# Patient Record
Sex: Female | Born: 1975 | Race: Black or African American | Hispanic: No | Marital: Single | Smoking: Never smoker
Health system: Southern US, Community
[De-identification: ages and names within clinical notes are randomized; demographics above are authoritative.]

## PROBLEM LIST (undated history)

## (undated) DIAGNOSIS — Z973 Presence of spectacles and contact lenses: Secondary | ICD-10-CM

## (undated) DIAGNOSIS — E785 Hyperlipidemia, unspecified: Secondary | ICD-10-CM

## (undated) DIAGNOSIS — I82601 Acute embolism and thrombosis of unspecified veins of right upper extremity: Secondary | ICD-10-CM

## (undated) DIAGNOSIS — E559 Vitamin D deficiency, unspecified: Secondary | ICD-10-CM

## (undated) DIAGNOSIS — F419 Anxiety disorder, unspecified: Secondary | ICD-10-CM

## (undated) HISTORY — PX: WRIST SURGERY: SHX841

## (undated) HISTORY — DX: Anxiety disorder, unspecified: F41.9

## (undated) HISTORY — DX: Acute embolism and thrombosis of unspecified veins of right upper extremity: I82.601

## (undated) HISTORY — DX: Hyperlipidemia, unspecified: E78.5

## (undated) HISTORY — DX: Vitamin D deficiency, unspecified: E55.9

---

## 1999-05-02 ENCOUNTER — Other Ambulatory Visit: Admission: RE | Admit: 1999-05-02 | Discharge: 1999-05-02 | Payer: Self-pay | Admitting: *Deleted

## 1999-08-12 ENCOUNTER — Encounter (INDEPENDENT_AMBULATORY_CARE_PROVIDER_SITE_OTHER): Payer: Self-pay

## 1999-08-12 ENCOUNTER — Other Ambulatory Visit: Admission: RE | Admit: 1999-08-12 | Discharge: 1999-08-12 | Payer: Self-pay | Admitting: *Deleted

## 1999-09-12 ENCOUNTER — Encounter (INDEPENDENT_AMBULATORY_CARE_PROVIDER_SITE_OTHER): Payer: Self-pay | Admitting: Specialist

## 1999-09-12 ENCOUNTER — Ambulatory Visit (HOSPITAL_COMMUNITY): Admission: RE | Admit: 1999-09-12 | Discharge: 1999-09-12 | Payer: Self-pay | Admitting: *Deleted

## 2000-04-02 ENCOUNTER — Emergency Department (HOSPITAL_COMMUNITY): Admission: EM | Admit: 2000-04-02 | Discharge: 2000-04-02 | Payer: Self-pay

## 2000-04-17 ENCOUNTER — Other Ambulatory Visit: Admission: RE | Admit: 2000-04-17 | Discharge: 2000-04-17 | Payer: Self-pay | Admitting: Obstetrics and Gynecology

## 2001-12-14 ENCOUNTER — Emergency Department (HOSPITAL_COMMUNITY): Admission: EM | Admit: 2001-12-14 | Discharge: 2001-12-14 | Payer: Self-pay | Admitting: Emergency Medicine

## 2001-12-14 ENCOUNTER — Encounter: Payer: Self-pay | Admitting: Emergency Medicine

## 2002-04-29 ENCOUNTER — Emergency Department (HOSPITAL_COMMUNITY): Admission: EM | Admit: 2002-04-29 | Discharge: 2002-04-29 | Payer: Self-pay | Admitting: Emergency Medicine

## 2002-07-17 ENCOUNTER — Other Ambulatory Visit: Admission: RE | Admit: 2002-07-17 | Discharge: 2002-07-17 | Payer: Self-pay | Admitting: Obstetrics and Gynecology

## 2003-07-21 ENCOUNTER — Other Ambulatory Visit: Admission: RE | Admit: 2003-07-21 | Discharge: 2003-07-21 | Payer: Self-pay | Admitting: Obstetrics and Gynecology

## 2004-05-04 ENCOUNTER — Emergency Department (HOSPITAL_COMMUNITY): Admission: EM | Admit: 2004-05-04 | Discharge: 2004-05-04 | Payer: Self-pay

## 2004-09-01 ENCOUNTER — Other Ambulatory Visit: Admission: RE | Admit: 2004-09-01 | Discharge: 2004-09-01 | Payer: Self-pay | Admitting: Obstetrics and Gynecology

## 2004-10-22 ENCOUNTER — Emergency Department (HOSPITAL_COMMUNITY): Admission: EM | Admit: 2004-10-22 | Discharge: 2004-10-22 | Payer: Self-pay | Admitting: Emergency Medicine

## 2004-10-28 ENCOUNTER — Ambulatory Visit: Payer: Self-pay | Admitting: Internal Medicine

## 2005-11-16 ENCOUNTER — Other Ambulatory Visit: Admission: RE | Admit: 2005-11-16 | Discharge: 2005-11-16 | Payer: Self-pay | Admitting: Obstetrics and Gynecology

## 2006-07-20 ENCOUNTER — Ambulatory Visit: Payer: Self-pay | Admitting: Family Medicine

## 2006-11-29 DIAGNOSIS — G43909 Migraine, unspecified, not intractable, without status migrainosus: Secondary | ICD-10-CM | POA: Insufficient documentation

## 2008-07-23 ENCOUNTER — Emergency Department (HOSPITAL_COMMUNITY): Admission: EM | Admit: 2008-07-23 | Discharge: 2008-07-23 | Payer: Self-pay | Admitting: Emergency Medicine

## 2008-07-31 ENCOUNTER — Ambulatory Visit: Payer: Self-pay | Admitting: Internal Medicine

## 2008-07-31 DIAGNOSIS — J189 Pneumonia, unspecified organism: Secondary | ICD-10-CM | POA: Insufficient documentation

## 2008-11-30 ENCOUNTER — Ambulatory Visit: Payer: Self-pay | Admitting: Internal Medicine

## 2008-11-30 DIAGNOSIS — M79609 Pain in unspecified limb: Secondary | ICD-10-CM | POA: Insufficient documentation

## 2008-11-30 DIAGNOSIS — L659 Nonscarring hair loss, unspecified: Secondary | ICD-10-CM | POA: Insufficient documentation

## 2008-11-30 LAB — CONVERTED CEMR LAB
AST: 17 units/L (ref 0–37)
Albumin: 3.8 g/dL (ref 3.5–5.2)
BUN: 9 mg/dL (ref 6–23)
Basophils Absolute: 0 10*3/uL (ref 0.0–0.1)
Basophils Relative: 0.7 % (ref 0.0–3.0)
Chloride: 106 meq/L (ref 96–112)
Creatinine, Ser: 0.8 mg/dL (ref 0.4–1.2)
Direct LDL: 147.6 mg/dL
Eosinophils Absolute: 0.1 10*3/uL (ref 0.0–0.7)
Eosinophils Relative: 1.3 % (ref 0.0–5.0)
GFR calc Af Amer: 107 mL/min
GFR calc non Af Amer: 88 mL/min
HCT: 41.1 % (ref 36.0–46.0)
HDL: 94.3 mg/dL (ref >39.0)
MCHC: 34.7 g/dL (ref 30.0–36.0)
MCV: 90.5 fL (ref 78.0–100.0)
Monocytes Absolute: 0.6 10*3/uL (ref 0.1–1.0)
Neutrophils Relative %: 57.7 % (ref 43.0–77.0)
Platelets: 241 10*3/uL (ref 150–400)
RBC: 4.54 M/uL (ref 3.87–5.11)
TSH: 0.51 microintl units/mL (ref 0.35–5.50)
Total Bilirubin: 0.6 mg/dL (ref 0.3–1.2)
Triglycerides: 42 mg/dL (ref 0–149)

## 2008-12-03 ENCOUNTER — Encounter: Payer: Self-pay | Admitting: Internal Medicine

## 2009-04-05 ENCOUNTER — Emergency Department (HOSPITAL_COMMUNITY): Admission: EM | Admit: 2009-04-05 | Discharge: 2009-04-05 | Payer: Self-pay | Admitting: Emergency Medicine

## 2009-04-06 ENCOUNTER — Encounter: Payer: Self-pay | Admitting: Internal Medicine

## 2009-04-07 ENCOUNTER — Ambulatory Visit: Payer: Self-pay | Admitting: Internal Medicine

## 2009-04-07 DIAGNOSIS — G43911 Migraine, unspecified, intractable, with status migrainosus: Secondary | ICD-10-CM | POA: Insufficient documentation

## 2009-04-10 ENCOUNTER — Encounter: Admission: RE | Admit: 2009-04-10 | Discharge: 2009-04-10 | Payer: Self-pay | Admitting: Specialist

## 2009-04-10 ENCOUNTER — Encounter: Payer: Self-pay | Admitting: Internal Medicine

## 2009-04-13 ENCOUNTER — Encounter: Payer: Self-pay | Admitting: Internal Medicine

## 2009-04-15 ENCOUNTER — Telehealth (INDEPENDENT_AMBULATORY_CARE_PROVIDER_SITE_OTHER): Payer: Self-pay | Admitting: *Deleted

## 2009-04-22 ENCOUNTER — Encounter: Payer: Self-pay | Admitting: Internal Medicine

## 2009-04-26 ENCOUNTER — Ambulatory Visit: Payer: Self-pay | Admitting: Internal Medicine

## 2009-04-26 DIAGNOSIS — J069 Acute upper respiratory infection, unspecified: Secondary | ICD-10-CM | POA: Insufficient documentation

## 2009-05-19 ENCOUNTER — Ambulatory Visit: Payer: Self-pay | Admitting: Internal Medicine

## 2009-05-19 DIAGNOSIS — G4700 Insomnia, unspecified: Secondary | ICD-10-CM | POA: Insufficient documentation

## 2009-05-23 DIAGNOSIS — F411 Generalized anxiety disorder: Secondary | ICD-10-CM | POA: Insufficient documentation

## 2009-05-23 DIAGNOSIS — F329 Major depressive disorder, single episode, unspecified: Secondary | ICD-10-CM | POA: Insufficient documentation

## 2009-05-26 ENCOUNTER — Telehealth (INDEPENDENT_AMBULATORY_CARE_PROVIDER_SITE_OTHER): Payer: Self-pay | Admitting: *Deleted

## 2009-05-28 ENCOUNTER — Encounter: Payer: Self-pay | Admitting: Internal Medicine

## 2009-06-15 ENCOUNTER — Encounter: Payer: Self-pay | Admitting: Internal Medicine

## 2009-06-16 ENCOUNTER — Ambulatory Visit: Payer: Self-pay | Admitting: Internal Medicine

## 2009-06-16 DIAGNOSIS — I10 Essential (primary) hypertension: Secondary | ICD-10-CM | POA: Insufficient documentation

## 2009-07-20 ENCOUNTER — Encounter: Payer: Self-pay | Admitting: Internal Medicine

## 2009-08-24 ENCOUNTER — Encounter: Payer: Self-pay | Admitting: Internal Medicine

## 2009-10-02 HISTORY — PX: OTHER SURGICAL HISTORY: SHX169

## 2009-11-22 ENCOUNTER — Emergency Department (HOSPITAL_COMMUNITY): Admission: EM | Admit: 2009-11-22 | Discharge: 2009-11-22 | Payer: Self-pay | Admitting: Emergency Medicine

## 2009-11-24 ENCOUNTER — Telehealth: Payer: Self-pay | Admitting: Internal Medicine

## 2009-12-03 ENCOUNTER — Encounter: Payer: Self-pay | Admitting: Internal Medicine

## 2010-01-10 ENCOUNTER — Encounter: Payer: Self-pay | Admitting: Internal Medicine

## 2010-02-07 ENCOUNTER — Emergency Department (HOSPITAL_COMMUNITY): Admission: EM | Admit: 2010-02-07 | Discharge: 2010-02-07 | Payer: Self-pay | Admitting: Emergency Medicine

## 2010-03-01 ENCOUNTER — Encounter: Payer: Self-pay | Admitting: Internal Medicine

## 2010-03-18 LAB — CONVERTED CEMR LAB: Pap Smear: NORMAL

## 2010-03-24 ENCOUNTER — Emergency Department (HOSPITAL_COMMUNITY): Admission: EM | Admit: 2010-03-24 | Discharge: 2010-03-24 | Payer: Self-pay | Admitting: Emergency Medicine

## 2010-03-29 ENCOUNTER — Emergency Department (HOSPITAL_COMMUNITY): Admission: EM | Admit: 2010-03-29 | Discharge: 2010-03-29 | Payer: Self-pay | Admitting: Emergency Medicine

## 2010-04-10 ENCOUNTER — Emergency Department (HOSPITAL_COMMUNITY): Admission: EM | Admit: 2010-04-10 | Discharge: 2010-04-10 | Payer: Self-pay | Admitting: Emergency Medicine

## 2010-04-29 ENCOUNTER — Encounter: Payer: Self-pay | Admitting: Internal Medicine

## 2010-05-18 ENCOUNTER — Emergency Department (HOSPITAL_COMMUNITY): Admission: EM | Admit: 2010-05-18 | Discharge: 2010-05-18 | Payer: Self-pay | Admitting: Emergency Medicine

## 2010-05-19 ENCOUNTER — Ambulatory Visit: Payer: Self-pay | Admitting: Internal Medicine

## 2010-05-19 DIAGNOSIS — J019 Acute sinusitis, unspecified: Secondary | ICD-10-CM | POA: Insufficient documentation

## 2010-05-25 ENCOUNTER — Encounter: Payer: Self-pay | Admitting: Internal Medicine

## 2010-06-20 ENCOUNTER — Encounter: Payer: Self-pay | Admitting: Internal Medicine

## 2010-06-21 ENCOUNTER — Ambulatory Visit (HOSPITAL_BASED_OUTPATIENT_CLINIC_OR_DEPARTMENT_OTHER): Admission: RE | Admit: 2010-06-21 | Discharge: 2010-06-21 | Payer: Self-pay | Admitting: Neurology

## 2010-06-25 ENCOUNTER — Ambulatory Visit: Payer: Self-pay | Admitting: Internal Medicine

## 2010-07-14 ENCOUNTER — Ambulatory Visit: Payer: Self-pay | Admitting: Internal Medicine

## 2010-07-21 ENCOUNTER — Ambulatory Visit: Payer: Self-pay | Admitting: Internal Medicine

## 2010-07-21 LAB — CONVERTED CEMR LAB
ALT: 11 units/L (ref 0–35)
AST: 15 units/L (ref 0–37)
Albumin: 4.3 g/dL (ref 3.5–5.2)
Alkaline Phosphatase: 74 units/L (ref 39–117)
Basophils Absolute: 0 10*3/uL (ref 0.0–0.1)
Basophils Relative: 0.2 % (ref 0.0–3.0)
Calcium: 9.7 mg/dL (ref 8.4–10.5)
Cholesterol: 235 mg/dL — ABNORMAL HIGH (ref 0–200)
Direct LDL: 147.9 mg/dL
Eosinophils Relative: 0.9 % (ref 0.0–5.0)
GFR calc non Af Amer: 83.49 mL/min (ref >60)
HCT: 43 % (ref 36.0–46.0)
Hemoglobin: 14.9 g/dL (ref 12.0–15.0)
Lymphocytes Relative: 26.3 % (ref 12.0–46.0)
Lymphs Abs: 2.3 10*3/uL (ref 0.7–4.0)
Monocytes Relative: 7.9 % (ref 3.0–12.0)
Neutro Abs: 5.6 10*3/uL (ref 1.4–7.7)
Nitrite: NEGATIVE
Potassium: 4.1 meq/L (ref 3.5–5.1)
RBC: 4.75 M/uL (ref 3.87–5.11)
RDW: 12.6 % (ref 11.5–14.6)
Sodium: 141 meq/L (ref 135–145)
Specific Gravity, Urine: 1.02 (ref 1.000–1.030)
Total Protein, Urine: NEGATIVE mg/dL
Urobilinogen, UA: 0.2 (ref 0.0–1.0)
VLDL: 24.8 mg/dL (ref 0.0–40.0)
WBC: 8.7 10*3/uL (ref 4.5–10.5)

## 2010-07-25 ENCOUNTER — Encounter: Payer: Self-pay | Admitting: Internal Medicine

## 2010-08-05 ENCOUNTER — Encounter: Admission: RE | Admit: 2010-08-05 | Discharge: 2010-08-05 | Payer: Self-pay | Admitting: Neurology

## 2010-08-12 ENCOUNTER — Encounter: Admission: RE | Admit: 2010-08-12 | Discharge: 2010-08-12 | Payer: Self-pay | Admitting: Neurology

## 2010-09-08 ENCOUNTER — Emergency Department (HOSPITAL_COMMUNITY): Admission: EM | Admit: 2010-09-08 | Discharge: 2010-02-14 | Payer: Self-pay | Admitting: Emergency Medicine

## 2010-11-01 NOTE — Progress Notes (Signed)
Summary: Med refill  Phone Note Refill Request  on November 24, 2009 10:31 AM  Refills Requested: Medication #1:  ZOLPIDEM TARTRATE 10 MG TABS 1po at bedtime as needed   Dosage confirmed as above?Dosage Confirmed   Last Refilled: 05/2009   Notes: [facility] [address], [phone] Initial call taken by: Scharlene Gloss,  November 24, 2009 10:32 AM  Follow-up for Phone Call        done hardcopy to [facility] - dahlia  Follow-up by: Corwin Levins MD,  November 24, 2009 12:54 PM  Additional Follow-up for Phone Call Additional follow up Details #1::        Faxed Hardcopy to [facility] [address] at [phone] Additional Follow-up by: Scharlene Gloss,  November 24, 2009 12:59 PM    New/Updated Medications: ZOLPIDEM TARTRATE 10 MG TABS (ZOLPIDEM TARTRATE) 1po at bedtime as needed Prescriptions: ZOLPIDEM TARTRATE 10 MG TABS (ZOLPIDEM TARTRATE) 1po at bedtime as needed  #30 x 5   Entered and Authorized by:   Corwin Levins MD   Signed by:   Corwin Levins MD on 11/24/2009   Method used:   Print then Give to Patient   RxID:   [id number]

## 2010-11-01 NOTE — Letter (Signed)
Summary: [facility]  [facility]   Imported By: Sherian Rein 12/10/2009 11:29:19  _____________________________________________________________________  External Attachment:    Type:   Image     Comment:   External Document

## 2010-11-01 NOTE — Assessment & Plan Note (Signed)
Summary: OV---PER PT -SAW A NEURO/WAS TOLD TO SEE MD/HEADACHES--STC   Vital Signs:  Patient profile:   35 year old female Height:      66.5 inches Weight:      193.38 pounds BMI:     30.86 O2 Sat:      94 % on Room air Temp:     99.2 degrees F oral Pulse rate:   80 / minute BP sitting:   132 / 88  (left arm) Cuff size:   regular  Vitals Entered By: Zella Ball Ewing CMA (AAMA) (May 19, 2010 3:03 PM)  O2 Flow:  Room air CC: headaches, stomach problems/RE   CC:  headaches and stomach problems/RE.  History of Present Illness: here to f/u;  overall doing ok but still with intractable daily headache, disabling and not able to work since june 29 , still sees Dr Economist with mult med trials;  Pt denies CP, worsening sob, doe, wheezing, orthopnea, pnd, worsening LE edema, palps, dizziness or syncope  Pt denies new neuro symptoms such as  facial or extremity weakness. No fever, wt loss, night sweats, loss of appetite or other constitutional symptoms , infact has gained wt adn hard to lose, despite occasional nausea (now on PPI);  seen in ER yesterday for acute tx since Dr lewit does not do acute tx in the office.  Denies worsenign reflux symtpoms, dysphagia, vomiting, abd pain.  Did have some dizziness and fatigue and unable to tolerate the inderal at the 40 mg two times a day.  Overall sleep improved on current meds.  Is undergoing cont'd counseling.  Incidently with facial pain, pressure adn greenish d/c for 3 days, without ST or cough.    Preventive Screening-Counseling & Management      Drug Use:  no.    Problems Prior to Update: 1)  Hypertension  (ICD-401.9) 2)  Depression  (ICD-311) 3)  Anxiety  (ICD-300.00) 4)  Insomnia-sleep Disorder-unspec  (ICD-780.52) 5)  Uri  (ICD-465.9) 6)  Migraine Unsp W/intractable W/status Migrainosus  (ICD-346.93) 7)  Finger Pain  (ICD-729.5) 8)  Alopecia  (ICD-704.00) 9)  Preventive Health Care  (ICD-V70.0) 10)  Pneumonia, Left  (ICD-486) 11)   Migraine, Unspec., w/o Intractable Migraine  (ICD-346.90)  Medications Prior to Update: 1)  Depo-Provera 150 Mg/ml Susp (Medroxyprogesterone Acetate) 2)  Ibuprofen 800 Mg Tabs (Ibuprofen) .... Prn 3)  Promethazine Hcl 25 Mg Tabs (Promethazine Hcl) .Marland Kitchen.. 1 By Mouth As Needed For Nausea 4)  Zolpidem Tartrate 10 Mg Tabs (Zolpidem Tartrate) .Marland Kitchen.. 1po At Bedtime As Needed 5)  Desipramine Hcl 10 Mg Tabs (Desipramine Hcl) .... 2 By Mouth Once Daily 6)  Sertraline Hcl 50 Mg Tabs (Sertraline Hcl) .Marland Kitchen.. 1 By Mouth Once Daily 7)  Zolpidem Tartrate 10 Mg Tabs (Zolpidem Tartrate) .Marland Kitchen.. 1 By Mouth At Bedtime As Needed 8)  Divalproex Sodium 250 Mg Tbec (Divalproex Sodium) .... 2 By Mouth Two Times A Day 9)  Hydrochlorothiazide 25 Mg Tabs (Hydrochlorothiazide) .... 1/2 By Mouth Once Daily  Current Medications (verified): 1)  Depo-Provera 150 Mg/ml Susp (Medroxyprogesterone Acetate) 2)  Ibuprofen 800 Mg Tabs (Ibuprofen) .... Prn 3)  Promethazine Hcl 25 Mg Tabs (Promethazine Hcl) .Marland Kitchen.. 1 By Mouth As Needed For Nausea 4)  Zolpidem Tartrate 10 Mg Tabs (Zolpidem Tartrate) .Marland Kitchen.. 1po At Bedtime As Needed 5)  Desipramine Hcl 10 Mg Tabs (Desipramine Hcl) .... 2 By Mouth Once Daily 6)  Sertraline Hcl 50 Mg Tabs (Sertraline Hcl) .Marland Kitchen.. 1 By Mouth Once Daily 7)  Divalproex Sodium  250 Mg Tbec (Divalproex Sodium) .... 2 By Mouth Two Times A Day 8)  Hydrochlorothiazide 25 Mg Tabs (Hydrochlorothiazide) .... 1/2 By Mouth Once Daily 9)  Propranolol Hcl 20 Mg Tabs (Propranolol Hcl) .Marland Kitchen.. 1 By Mouth Two Times A Day 10)  Zofran Odt 4 Mg Tbdp (Ondansetron) .Marland Kitchen.. 1 To 2 By Mouth Three Times A Day For Nausea 11)  Trazodone Hcl 50 Mg Tabs (Trazodone Hcl) .Marland Kitchen.. 1 By Mouth Three Times A Day At Bedtime 12)  Butalbital-Apap-Caffeine 50-325-40 Mg Caps (Butalbital-Apap-Caffeine) .Marland Kitchen.. 1 By Mouth Once Daily 13)  Pantoprazole Sodium 40 Mg Tbec (Pantoprazole Sodium) .Marland Kitchen.. 1 By Mouth Once Daily 14)  Diltiazem Hcl Er Beads 180 Mg Xr24h-Cap (Diltiazem  Hcl Er Beads) .Marland Kitchen.. 1po Once Daily 15)  Azithromycin 250 Mg Tabs (Azithromycin) .... 2po Qd For 1 Day, Then 1po Qd For 4days, Then Stop  Allergies (verified): 1)  ! * Azithromycin 2)  ! Codeine 3)  ! Hydrocodone 4)  ! 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 migraine Anxiety Depression Hypertension  Social History: Reviewed history from 11/30/2008 and no changes required. 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   Head:  normocephalic and atraumatic.   Eyes:  vision grossly intact, pupils equal, and pupils round.   Ears:  R ear normal and L ear normal.   Nose:  no external deformity and no nasal discharge.   Mouth:  no gingival abnormalities and pharynx pink and moist.   Neck:  supple and no masses.   Lungs:  normal respiratory effort and normal breath sounds.   Heart:  normal rate and regular rhythm.   Extremities:  no edema, no erythema  Neurologic:  cranial nerves II-XII intact and strength normal in all extremities.     Impression & Recommendations:  Problem # 1:  HYPERTENSION (ICD-401.9)  Her updated medication list for this problem includes:    Hydrochlorothiazide 25 Mg Tabs (Hydrochlorothiazide) .Marland Kitchen... 1/2 by mouth once daily    Propranolol Hcl 20 Mg Tabs (Propranolol hcl) .Marland Kitchen... 1 by mouth two times a day    Diltiazem Hcl Er Beads 180 Mg Xr24h-cap (Diltiazem hcl er beads) .Marland Kitchen... 1po once daily  BP today: 132/88 Prior BP: 132/92 (06/16/2009)  Labs Reviewed: K+:  4.0 (11/30/2008) Creat: : 0.8 (11/30/2008)   Chol: 265 (11/30/2008)   HDL: 94.3 (11/30/2008)   LDL: DEL (11/30/2008)   TG: 42 (11/30/2008) to add diltiazem as above, may help with migraine prophylaxis as well   Problem # 2:  MIGRAINE UNSP W/INTRACTABLE W/STATUS MIGRAINOSUS (ICD-346.93)  Her updated medication list for this problem includes:    Ibuprofen 800 Mg Tabs (Ibuprofen) .Marland Kitchen... Prn    Propranolol Hcl 20 Mg Tabs (Propranolol hcl) .Marland Kitchen... 1 by mouth two times a day    Butalbital-apap-caffeine 50-325-40 Mg Caps (Butalbital-apap-caffeine) .Marland Kitchen... 1 by mouth once daily treat as above, f/u any worsening signs or symptoms , as well as dilt as above  Problem # 3:  INSOMNIA-SLEEP DISORDER-UNSPEC (ICD-780.52)  The following medications were removed from  the medication list:    Zolpidem Tartrate 10 Mg Tabs (Zolpidem tartrate) .Marland Kitchen... 1 by mouth at bedtime as needed Her updated medication list for this problem includes:    Zolpidem Tartrate 10 Mg Tabs (Zolpidem tartrate) .Marland Kitchen... 1po at bedtime as needed treat as above, f/u any worsening signs or symptoms , also on incr trazodone since last visit with neuro  Problem # 4:  SINUSITIS- ACUTE-NOS (ICD-461.9)  Her updated medication list for this problem includes:    Azithromycin 250 Mg Tabs (Azithromycin) .Marland Kitchen... 2po qd for 1 day, then 1po qd for 4days, then stop treat as above, f/u any worsening signs or symptoms   Complete Medication List: 1)  Depo-provera 150 Mg/ml Susp (Medroxyprogesterone acetate) 2)  Ibuprofen 800 Mg Tabs (Ibuprofen) .... Prn 3)  Promethazine Hcl 25 Mg Tabs (Promethazine hcl) .Marland Kitchen.. 1 by mouth as needed for nausea 4)  Zolpidem Tartrate 10 Mg Tabs (Zolpidem tartrate) .Marland Kitchen.. 1po at bedtime as needed 5)  Desipramine Hcl 10 Mg Tabs (Desipramine hcl) .... 2 by mouth once daily 6)  Sertraline Hcl 50 Mg Tabs (Sertraline hcl) .Marland Kitchen.. 1 by mouth once daily 7)  Divalproex Sodium 250 Mg Tbec (Divalproex sodium) .... 2 by mouth two times a day 8)   Hydrochlorothiazide 25 Mg Tabs (Hydrochlorothiazide) .... 1/2 by mouth once daily 9)  Propranolol Hcl 20 Mg Tabs (Propranolol hcl) .Marland Kitchen.. 1 by mouth two times a day 10)  Zofran Odt 4 Mg Tbdp (Ondansetron) .Marland Kitchen.. 1 to 2 by mouth three times a day for nausea 11)  Trazodone Hcl 50 Mg Tabs (Trazodone hcl) .Marland Kitchen.. 1 by mouth three times a day at bedtime 12)  Butalbital-apap-caffeine 50-325-40 Mg Caps (Butalbital-apap-caffeine) .Marland Kitchen.. 1 by mouth once daily 13)  Pantoprazole Sodium 40 Mg Tbec (Pantoprazole sodium) .Marland Kitchen.. 1 by mouth once daily 14)  Diltiazem Hcl Er Beads 180 Mg Xr24h-cap (Diltiazem hcl er beads) .Marland Kitchen.. 1po once daily 15)  Azithromycin 250 Mg Tabs (Azithromycin) .... 2po qd for 1 day, then 1po qd for 4days, then stop  Patient Instructions: 1)  Please take all new medications as prescribed 2)  Continue all previous medications as before this visit  3)  Please schedule a follow-up appointment in 2 months with CPX labs Prescriptions: AZITHROMYCIN 250 MG TABS (AZITHROMYCIN) 2po qd for 1 day, then 1po qd for 4days, then stop  #6 x 1   Entered and Authorized by:   Corwin Levins MD   Signed by:   Corwin Levins MD on 05/19/2010   Method used:   Print then Give to Patient   RxID:   [id number] DILTIAZEM HCL ER BEADS 180 MG XR24H-CAP (DILTIAZEM HCL ER BEADS) 1po once daily  #90 x 3   Entered and Authorized by:   Corwin Levins MD   Signed by:   Corwin Levins MD on 05/19/2010   Method used:   Print then Give to Patient   RxID:   [id number]

## 2010-11-01 NOTE — Letter (Signed)
Summary: [facility]  [facility]   Imported By: Lester [place] 01/19/2010 09:25:13  _____________________________________________________________________  External Attachment:    Type:   Image     Comment:   External Document

## 2010-11-01 NOTE — Letter (Signed)
Summary: [facility]  [facility]   Imported By: Lennie Odor 06/27/2010 11:12:47  _____________________________________________________________________  External Attachment:    Type:   Image     Comment:   External Document

## 2010-11-01 NOTE — Assessment & Plan Note (Signed)
Summary: 2 mos f/u #/ cd   Vital Signs:  Patient profile:   35 year old female Height:      66 inches Weight:      186.25 pounds BMI:     30.17 O2 Sat:      97 % on Room air Temp:     98.4 degrees F oral Pulse rate:   75 / minute BP sitting:   110 / 70  (left arm) Cuff size:   regular  Vitals Entered By: Zella Ball Ewing CMA [place]) (July 21, 2010 1:26 PM)  O2 Flow:  Room air  Preventive Care Screening  Pap Smear:    Date:  03/18/2010    Results:  normal   CC: 2 month followup/RE   CC:  2 month followup/RE.  History of Present Illness: here for wellness, overall doing well, still with refractory headaches and has referral to [facility] neurology but could get an appt until 4 months;  Pt denies CP, worsening sob, doe, wheezing, orthopnea, pnd, worsening LE edema, palps, dizziness or syncope  Pt denies other new neuro symptoms such as facial or extremity weakness No fever, wt loss, night sweats, loss of appetite or other constitutional symptoms  Pt denies polydipsia, polyuria  Overall good compliance with meds, trying to follow low chol diet but not always successful, wt stable, little excercise however .   Pt able to perform all ADL's, home safety adequate, denies worsening depressive symtpoms or anxiety or panic.  no suicidal ideation  Problems Prior to Update: 1)  Sinusitis- Acute-nos  (ICD-461.9) 2)  Hypertension  (ICD-401.9) 3)  Depression  (ICD-311) 4)  Anxiety  (ICD-300.00) 5)  Insomnia-sleep Disorder-unspec  (ICD-780.52) 6)  Uri  (ICD-465.9) 7)  Migraine Unsp W/intractable W/status Migrainosus  (ICD-346.93) 8)  Finger Pain  (ICD-729.5) 9)  Alopecia  (ICD-704.00) 10)  Preventive Health Care  (ICD-V70.0) 11)  Pneumonia, Left  (ICD-486) 12)  Migraine, Unspec., w/o Intractable Migraine  (ICD-346.90)  Medications Prior to Update: 1)  Depo-Provera 150 Mg/ml Susp (Medroxyprogesterone Acetate) 2)  Ibuprofen 800 Mg Tabs (Ibuprofen) .... Prn 3)  Promethazine Hcl 25 Mg Tabs  (Promethazine Hcl) .Marland Kitchen.. 1 By Mouth As Needed For Nausea 4)  Zolpidem Tartrate 10 Mg Tabs (Zolpidem Tartrate) .Marland Kitchen.. 1po At Bedtime As Needed 5)  Desipramine Hcl 10 Mg Tabs (Desipramine Hcl) .... 2 By Mouth Once Daily 6)  Sertraline Hcl 50 Mg Tabs (Sertraline Hcl) .Marland Kitchen.. 1 By Mouth Once Daily 7)  Divalproex Sodium 250 Mg Tbec (Divalproex Sodium) .... 2 By Mouth Two Times A Day 8)  Hydrochlorothiazide 25 Mg Tabs (Hydrochlorothiazide) .... 1/2 By Mouth Once Daily 9)  Propranolol Hcl 20 Mg Tabs (Propranolol Hcl) .Marland Kitchen.. 1 By Mouth Two Times A Day 10)  Zofran Odt 4 Mg Tbdp (Ondansetron) .Marland Kitchen.. 1 To 2 By Mouth Three Times A Day For Nausea 11)  Trazodone Hcl 50 Mg Tabs (Trazodone Hcl) .Marland Kitchen.. 1 By Mouth Three Times A Day At Bedtime 12)  Butalbital-Apap-Caffeine 50-325-40 Mg Caps (Butalbital-Apap-Caffeine) .Marland Kitchen.. 1 By Mouth Once Daily 13)  Pantoprazole Sodium 40 Mg Tbec (Pantoprazole Sodium) .Marland Kitchen.. 1 By Mouth Once Daily 14)  Diltiazem Hcl Er Beads 180 Mg Xr24h-Cap (Diltiazem Hcl Er Beads) .Marland Kitchen.. 1po Once Daily 15)  Azithromycin 250 Mg Tabs (Azithromycin) .... 2po Qd For 1 Day, Then 1po Qd For 4days, Then Stop  Current Medications (verified): 1)  Promethazine Hcl 25 Mg Tabs (Promethazine Hcl) .Marland Kitchen.. 1 By Mouth As Needed For Nausea 2)  Butalbital-Apap-Caffeine 50-325-40 Mg Caps (Butalbital-Apap-Caffeine) .Marland KitchenMarland KitchenMarland Kitchen  1 By Mouth Once Daily 3)  Pantoprazole Sodium 40 Mg Tbec (Pantoprazole Sodium) .Marland Kitchen.. 1 By Mouth Once Daily 4)  Diltiazem Hcl Er Beads 180 Mg Xr24h-Cap (Diltiazem Hcl Er Beads) .Marland Kitchen.. 1po Once Daily 5)  Zonisamide 100 Mg Caps (Zonisamide) .... 4 By Mouth Every Morning 6)  Sumavel Dosepro 6 Mg/0.62ml Devi (Sumatriptan Succinate) .... Use Asd  Allergies (verified): 1)  ! * Azithromycin 2)  ! Codeine 3)  ! Hydrocodone 4)  ! 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   Head:  normocephalic and atraumatic.   Eyes:  vision grossly intact, pupils equal, and pupils round.   Ears:  R ear normal and L ear normal.   Nose:  no external deformity and no nasal discharge.   Mouth:  no gingival abnormalities and pharynx pink and moist.   Neck:  supple and no masses.   Lungs:  normal respiratory effort and normal breath sounds.   Heart:  normal rate and regular rhythm.   Abdomen:  soft, non-tender, and normal bowel sounds.   Msk:  no joint tenderness and no joint swelling.   Extremities:  no edema, no erythema  Neurologic:  cranial nerves II-XII intact and strength normal in all extremities.   Skin:  color normal and no rashes.   Psych:  not depressed appearing and slightly anxious.     Impression & Recommendations:  Problem # 1:  Preventive Health Care (ICD-V70.0) Overall doing well, age appropriate education and counseling updated, referral for preventive services and immunizations addressed, dietary counseling and smoking status adressed , most recent labs reviewed with pt, ecg declined per pt  Orders: TLB-BMP (Basic Metabolic Panel-BMET) (80048-METABOL) TLB-CBC Platelet - w/Differential (85025-CBCD) TLB-Hepatic/Liver Function Pnl (80076-HEPATIC) TLB-Lipid Panel (80061-LIPID) TLB-TSH (Thyroid Stimulating Hormone) (84443-TSH) TLB-Udip ONLY (81003-UDIP) I have personally reviewed the Medicare Annual Wellness questionnaire and have noted 1.   The patient's medical and social history 2.   Their  use of alcohol, tobacco or illicit drugs 3.   Their current medications and supplements 4.   The patient's functional ability including ADL's, fall risks, home safety risks and hearing or visual             impairment. 5.   Diet and physical activities 6.   Evidence for depression or mood disorders  The patients weight, height, BMI  have been recorded in the chart I have made referrals, counseling and provided education to the patient based review of the above   Problem # 2:  HYPERTENSION (ICD-401.9)  The following medications were removed from the medication list:    Hydrochlorothiazide 25 Mg Tabs (Hydrochlorothiazide) .Marland Kitchen... 1/2 by mouth once daily    Propranolol Hcl 20 Mg Tabs (Propranolol hcl) .Marland Kitchen... 1 by mouth two times a day Her updated medication list for this problem includes:    Diltiazem Hcl Er Beads 180 Mg Xr24h-cap (Diltiazem hcl er beads) .Marland Kitchen... 1po once daily  BP today: 110/70 Prior BP:  132/88 (05/19/2010)  Labs Reviewed: K+: 4.0 (11/30/2008) Creat: : 0.8 (11/30/2008)   Chol: 265 (11/30/2008)   HDL: 94.3 (11/30/2008)   LDL: DEL (11/30/2008)   TG: 42 (11/30/2008) stable overall by hx and exam, ok to continue meds/tx as is   Complete Medication List: 1)  Promethazine Hcl 25 Mg Tabs (Promethazine hcl) .Marland Kitchen.. 1 by mouth as needed for nausea 2)  Butalbital-apap-caffeine 50-325-40 Mg Caps (Butalbital-apap-caffeine) .Marland Kitchen.. 1 by mouth once daily 3)  Pantoprazole Sodium 40 Mg Tbec (Pantoprazole sodium) .Marland Kitchen.. 1 by mouth once daily 4)  Diltiazem Hcl Er Beads 180 Mg Xr24h-cap (Diltiazem hcl er beads) .Marland Kitchen.. 1po once daily 5)  Zonisamide 100 Mg Caps (Zonisamide) .... 4 by mouth every morning 6)  Sumavel Dosepro 6 Mg/0.27ml Devi (Sumatriptan succinate) .... Use asd  Other Orders: Admin 1st Vaccine (27253) Flu Vaccine 44yrs + [phone]) Tdap => 37yrs IM (34742) Admin of Any Addtl Vaccine (59563)  Patient Instructions: 1)  you had the tetanus and flu shots today 2)  Please go to the Lab in the  basement for your blood and/or urine tests today  3)  Please call the number on the [facility] Card for results of your testing  4)  Please schedule a follow-up appointment in 1 year, or sooner if needed   Orders Added: 1)  Admin 1st Vaccine [90471] 2)  Flu Vaccine 30yrs + [90658] 3)  Tdap => 15yrs IM [90715] 4)  Admin of Any Addtl Vaccine [90472] 5)  TLB-BMP (Basic Metabolic Panel-BMET) [80048-METABOL] 6)  TLB-CBC Platelet - w/Differential [85025-CBCD] 7)  TLB-Hepatic/Liver Function Pnl [80076-HEPATIC] 8)  TLB-Lipid Panel [80061-LIPID] 9)  TLB-TSH (Thyroid Stimulating Hormone) [84443-TSH] 10)  TLB-Udip ONLY [81003-UDIP] 11)  Est. Patient 18-39 years [99395]   Immunizations Administered:  Tetanus Vaccine:    Vaccine Type: Tdap    Site: right deltoid    Mfr: GlaxoSmithKline    Dose: 0.5 ml    Route: IM    Given by: Zella Ball Ewing CMA (AAMA)    Exp. Date: 07/21/2012    Lot #: [id number]    VIS given: 08/19/08 version given July 21, 2010. Flu Vaccine Consent Questions     Do you have a history of severe allergic reactions to this vaccine? no    Any prior history of allergic reactions to egg and/or gelatin? no    Do you have a sensitivity to the preservative Thimersol? no    Do you have a past history of Guillan-Barre Syndrome? no    Do you currently have an acute febrile illness? no    Have you ever had a severe reaction to latex? no    Vaccine information given and explained to patient? yes    Are you currently pregnant? no    Lot Number:[id number]   Exp Date:04/01/2011   Site Given  Left Deltoid IM  Immunizations Administered:  Tetanus Vaccine:    Vaccine Type: Tdap    Site: right deltoid    Mfr: GlaxoSmithKline    Dose: 0.5 ml    Route: IM    Given by: Zella Ball Ewing CMA (AAMA)    Exp. Date: 07/21/2012    Lot #: [id number]    VIS given: 08/19/08 version given July 21, 2010.        Marland [email or web address]

## 2010-11-01 NOTE — Letter (Signed)
Summary: [facility]  [facility]   Imported By: Sherian Rein 06/01/2010 13:25:52  _____________________________________________________________________  External Attachment:    Type:   Image     Comment:   External Document

## 2010-11-01 NOTE — Letter (Signed)
Summary: [facility]  [facility]   Imported By: Sherian Rein 08/03/2010 15:12:10  _____________________________________________________________________  External Attachment:    Type:   Image     Comment:   External Document

## 2010-11-01 NOTE — Letter (Signed)
Summary: [facility]  [facility]   Imported By: Sherian Rein 03/09/2010 11:49:13  _____________________________________________________________________  External Attachment:    Type:   Image     Comment:   External Document

## 2010-11-01 NOTE — Letter (Signed)
Summary: [facility]   Imported By: Sherian Rein 05/09/2010 08:10:10  _____________________________________________________________________  External Attachment:    Type:   Image     Comment:   External Document

## 2010-11-17 ENCOUNTER — Encounter: Payer: Self-pay | Admitting: Internal Medicine

## 2010-11-29 NOTE — Letter (Signed)
Summary: [facility]  [facility]   Imported By: Lennie Odor 11/23/2010 14:23:25  _____________________________________________________________________  External Attachment:    Type:   Image     Comment:   External Document

## 2010-12-18 LAB — POCT PREGNANCY, URINE: Preg Test, Ur: NEGATIVE

## 2011-01-19 ENCOUNTER — Encounter: Payer: Self-pay | Admitting: Pulmonary Disease

## 2011-01-23 ENCOUNTER — Ambulatory Visit (INDEPENDENT_AMBULATORY_CARE_PROVIDER_SITE_OTHER): Payer: Managed Care, Other (non HMO) | Admitting: Pulmonary Disease

## 2011-01-23 ENCOUNTER — Encounter: Payer: Self-pay | Admitting: Pulmonary Disease

## 2011-01-23 DIAGNOSIS — G4700 Insomnia, unspecified: Secondary | ICD-10-CM

## 2011-01-23 NOTE — Patient Instructions (Signed)
Behaviour therapy would be the best approach for you Start with 3 hours of sleep at night - use alram clock if needed Increase by 30 mins every week  Rules of sleep hygiene were discussed  - light exercise x 3 mins every day -avoid caffeinated beverages - no more than 20 mins staying awake in bed, if not asleep, get out of bed & reading or light music - No TV or computer games at bedtime.

## 2011-01-23 NOTE — Progress Notes (Signed)
  Subjective:    Patient ID: Lauren Wolfe, female    DOB: [birth year], 35 y.o.   MRN: [id number]  HPI 34/F for evaluation of insomnia & migraines. She is followed at the [facility] [facility] & was referred to the headache clinic at [place] on one occasion.  Migraines started in 2008, (paradoxically) worse with sleep , stress, smells, lights. Sleep time was less, decreased more when migraines started Scared to go to sleep.She has a problem both with sleep initiation & maintenance. She has persistent daily headaches, Off fioricet & sumatriptan now due to limited benefit,  Amitryptiline recomm by dr Marshell [facility]) but stopped now . She only uses naprosyn & lyrica . Extensive evaluation has included MRI, CSF exam. Prophylactic meds for migraines have included depakote, atenolol, keppra, verapamil, vivactil, zonegran, inderal, remeron, topamax, desipramine, neurontin & botox. She has always been an early riser, her sleep pattern is now distorted & she sleeps 3-4 hours in the daytime & stays up all night. If she does sleep at night, there are awakenings every hour. PSG 06/21/10 showed TST of 224 mins with absence of slow wave sleep. There was no sleep disordered breathing or limb movements. There were several spontaneous arousals with one period of sustained awakenig from 1245 to 1 45 am. ESS 1 She is disabled  [place], [place], melatonin, tylenol & advil, sleeping teas , trazodone without much help. She has tried alternative medicine techniques such as acupuncture. There is no history suggestive of cataplexy, sleep paralysis or parasomnias     Review of Systems  Constitutional: Negative for fever and unexpected weight change.  HENT: Negative for ear pain, nosebleeds, congestion, sore throat, rhinorrhea, sneezing, trouble swallowing, dental problem, postnasal drip and sinus pressure.   Eyes: Negative for redness and itching.  Respiratory: Negative for cough, chest tightness, shortness of  breath and wheezing.   Cardiovascular: Negative for palpitations and leg swelling.  Gastrointestinal: Negative for nausea and vomiting.  Genitourinary: Negative for dysuria.  Musculoskeletal: Negative for joint swelling.  Skin: Negative for rash.  Neurological: Positive for headaches.  Hematological: Does not bruise/bleed easily.  Psychiatric/Behavioral: Negative for dysphoric mood. The patient is not nervous/anxious.        Objective:   Physical Exam Gen. Pleasant, well-nourished, in no distress, normal affect ENT - no lesions, no post nasal drip Neck: No JVD, no thyromegaly, no carotid bruits Lungs: no use of accessory muscles, no dullness to percussion, clear without rales or rhonchi  Cardiovascular: Rhythm regular, heart sounds  normal, no murmurs or gallops, no peripheral edema Abdomen: soft and non-tender, no hepatosplenomegaly, BS normal. Musculoskeletal: No deformities, no cyanosis or clubbing Neuro:  alert, non focal        Assessment & Plan:

## 2011-01-24 NOTE — Assessment & Plan Note (Addendum)
Chronic co-morbid insomnia, related to migraine but now has taken a life of it's own - & should be treated independently of the migraine. She does not seem to have sleep disordered breathing or periodic limb movements. She has tried hypnotics & alternative medicine without relief. There does seem to be some circadian distortion as well due to the chronicity of he rproblem & 5 mg melatonin 3 hrs prior to bedtime may be helpful It would be best to pursue cognitive behaviour techniques here such as sleep consolidation Start with 3 hours of sleep at night - use alarm clock if needed Increase by 30 mins every week  Rules of sleep hygiene were discussed  - light exercise x 30 mins every day -avoid caffeinated beverages - no more than 20 mins staying awake in bed, if not asleep, get out of bed & reading or light music

## 2011-02-17 NOTE — Op Note (Signed)
[facility]  Patient:    Lauren Wolfe                      MRN: [id number] Proc. Date: 09/12/99 Adm. Date:  [id number] Attending:  Pleas Koch                           Operative Report  PREOPERATIVE DIAGNOSIS:       High grade cervical dysplasia.  POSTOPERATIVE DIAGNOSIS:      High grade cervical dysplasia.  OPERATION:                    A loop electrosurgical excision procedure (LEEP).  SURGEON:                      Georgina Peer, M.D.  ANESTHESIA:                   IV sedation with 2% Xylocaine anterior cervical block                               using 2% Xylocaine with epinephrine, 7 cc.  COMPLICATIONS:                None.  INDICATIONS:                  This is a 35 year old black female with an abnormal Pap smear.  Evaluated in the office with colposcopy and biopsy showing a high grade cervical dysplasia and biopsies at six and 12 oclock on the cervix.  The lesion  was completely visualized and the patient has chosen loop excision for treatment. She is aware of the risks and complications including bleeding, infection, cervical stenosis, or weakening, and the recurrence risk of 1 in 10 for complete excision.  DESCRIPTION OF PROCEDURE:     The patient was taken to the operating room and placed in the dorsal lithotomy position, and legs in Allen stirrups.  A speculum was placed in the vagina.  The cervix was visualized and washed with acetic acid. Injected with a total of 7 cc of 2% Xylocaine with epinephrine.  The cervix had  blanched __________ in the cervix.  The lesions were identified at six and 12. A green handled loop to a depth of 10 mm was used to excise from 12 to six, the LEEP specimen.  Some small bleeders were cauterized with ________ coagulator.  The specimen was cut at three oclock and marked appropriately, and sent to pathology. Small cervical specimen below the original specimen was also sent to  complete the symmetry of the excision.                                The patient tolerated the procedure well. Sponge, needle and instrument counts were correct, and the patient was transferred back to the recovery area in good condition. DD:  09/12/99 TD:  09/13/99 Job: [id number]

## 2011-04-16 ENCOUNTER — Emergency Department (HOSPITAL_COMMUNITY)
Admission: EM | Admit: 2011-04-16 | Discharge: 2011-04-16 | Disposition: A | Discharge status: Home / Self Care | Payer: Managed Care, Other (non HMO) | Attending: Emergency Medicine | Admitting: Emergency Medicine

## 2012-07-12 ENCOUNTER — Telehealth: Payer: Self-pay

## 2012-07-12 DIAGNOSIS — Z0000 Encounter for general adult medical examination without abnormal findings: Secondary | ICD-10-CM

## 2012-07-12 NOTE — Telephone Encounter (Signed)
Put order in for physical labs. 

## 2012-09-19 ENCOUNTER — Other Ambulatory Visit (INDEPENDENT_AMBULATORY_CARE_PROVIDER_SITE_OTHER): Payer: Managed Care, Other (non HMO)

## 2012-09-19 DIAGNOSIS — Z0000 Encounter for general adult medical examination without abnormal findings: Secondary | ICD-10-CM

## 2012-09-19 DIAGNOSIS — I10 Essential (primary) hypertension: Secondary | ICD-10-CM

## 2012-09-19 LAB — LIPID PANEL
Cholesterol: 238 mg/dL — ABNORMAL HIGH (ref 0–200)
Total CHOL/HDL Ratio: 3
Triglycerides: 81 mg/dL (ref 0.0–149.0)

## 2012-09-19 LAB — URINALYSIS, ROUTINE W REFLEX MICROSCOPIC
Bilirubin Urine: NEGATIVE
Ketones, ur: NEGATIVE
Nitrite: NEGATIVE
Total Protein, Urine: NEGATIVE
Urine Glucose: NEGATIVE
pH: 7 (ref 5.0–8.0)

## 2012-09-19 LAB — CBC WITH DIFFERENTIAL/PLATELET
Basophils Absolute: 0 10*3/uL (ref 0.0–0.1)
Eosinophils Absolute: 0.1 10*3/uL (ref 0.0–0.7)
Lymphocytes Relative: 29.8 % (ref 12.0–46.0)
MCHC: 33.7 g/dL (ref 30.0–36.0)
Monocytes Relative: 7.4 % (ref 3.0–12.0)
Neutrophils Relative %: 61.1 % (ref 43.0–77.0)
RDW: 12.8 % (ref 11.5–14.6)

## 2012-09-19 LAB — BASIC METABOLIC PANEL
Chloride: 104 mEq/L (ref 96–112)
Potassium: 4.1 mEq/L (ref 3.5–5.1)
Sodium: 138 mEq/L (ref 135–145)

## 2012-09-19 LAB — HEPATIC FUNCTION PANEL
ALT: 13 U/L (ref 0–35)
AST: 14 U/L (ref 0–37)
Alkaline Phosphatase: 66 U/L (ref 39–117)
Bilirubin, Direct: 0.1 mg/dL (ref 0.0–0.3)
Total Bilirubin: 0.4 mg/dL (ref 0.3–1.2)

## 2012-09-20 ENCOUNTER — Other Ambulatory Visit: Payer: Self-pay | Admitting: Obstetrics and Gynecology

## 2012-09-20 DIAGNOSIS — R928 Other abnormal and inconclusive findings on diagnostic imaging of breast: Secondary | ICD-10-CM

## 2012-09-24 ENCOUNTER — Ambulatory Visit (INDEPENDENT_AMBULATORY_CARE_PROVIDER_SITE_OTHER): Payer: Medicare PPO | Admitting: Internal Medicine

## 2012-09-24 ENCOUNTER — Encounter: Payer: Self-pay | Admitting: Internal Medicine

## 2012-09-24 ENCOUNTER — Encounter: Payer: Managed Care, Other (non HMO) | Admitting: Internal Medicine

## 2012-09-24 VITALS — BP 102/70 | HR 74 | Temp 98.6°F | Ht 66.5 in | Wt 203.1 lb

## 2012-09-24 DIAGNOSIS — Z0000 Encounter for general adult medical examination without abnormal findings: Secondary | ICD-10-CM | POA: Insufficient documentation

## 2012-09-24 NOTE — Patient Instructions (Addendum)
Continue all other medications as before Please keep your appointments with your specialists as you have planned Please continue your efforts at being more active, low cholesterol diet, and weight control. Please remember to followup with your GYN for the yearly pap smear and/or mammogram as you do Please return if you change your mind about the flu shot Thank you for enrolling in MyChart. Please follow the instructions below to securely access your online medical record. MyChart allows you to send messages to your doctor, view your test results, renew your prescriptions, schedule appointments, and more. To Log into MyChart, please go to [email or web address], and your Username is: [email or web address] Please return in 1 year for your yearly visit, or sooner if needed, with Lab testing done 3-5 days before

## 2012-09-24 NOTE — Progress Notes (Signed)
Subjective:    Patient ID: Lauren Wolfe, female    DOB: 11-03-1975, 36 y.o.   MRN: [id number]  HPI  Here for wellness and f/u;  Overall doing ok;  Pt denies CP, worsening SOB, DOE, wheezing, orthopnea, PND, worsening LE edema, palpitations, dizziness or syncope.  Pt denies neurological change such as facial or extremity weakness.  Pt denies polydipsia, polyuria, or low sugar symptoms. Pt states overall good compliance with treatment and medications, good tolerability, and trying to follow lower cholesterol diet.  Pt denies worsening depressive symptoms, suicidal ideation or panic. No fever, wt loss, night sweats, loss of appetite, or other constitutional symptoms.  Pt states good ability with ADL's, low fall risk, home safety reviewed and adequate, no significant changes in hearing or vision, and occasionally active with exercise.  Mother died in MVA sept 02-27-2012.  Sees nuerology for HA and sleep, has gained about 30 lbs in past 6 mo.  After mom died has not to gym like before.  Denies worsening depressive symptoms, suicidal ideation, or panic, though has ongoing anxiety.  Still has about 20 migraine per month, now getting disability. Past Medical History  Diagnosis Date  . Migraine   . Anxiety   . Depression   . HTN (hypertension)    No past surgical history on file.  reports that she has never smoked. She has never used smokeless tobacco. She reports that she does not drink alcohol or use illicit drugs. family history includes Aneurysm in her mother; Breast cancer in her other; Diabetes in her other; Gout in her other; Hypertension in her mother and other; and Other in her other. Allergies  Allergen Reactions  . Prednisone     SOB  . Azithromycin     REACTION: rash, prurutis  . Codeine   . Hydrocodone   . Ketorolac Tromethamine    Current Outpatient Prescriptions on File Prior to Visit  Medication Sig Dispense Refill  . etonogestrel-ethinyl estradiol (NUVARING) 0.12-0.015 MG/24HR vaginal  ring Place 1 each vaginally every 28 (twenty-eight) days. Insert vaginally and leave in place for 3 consecutive weeks, then remove for 1 week.       . naproxen (NAPROSYN) 500 MG tablet Take 500 mg by mouth. 1 tablet by mouth 2-3 times a week       . promethazine (PHENERGAN) 25 MG tablet Take 25 mg by mouth as needed.         Review of Systems Review of Systems  Constitutional: Negative for diaphoresis, activity change, appetite change and unexpected weight change.  HENT: Negative for hearing loss, ear pain, facial swelling, mouth sores and neck stiffness.   Eyes: Negative for pain, redness and visual disturbance.  Respiratory: Negative for shortness of breath and wheezing.   Cardiovascular: Negative for chest pain and palpitations.  Gastrointestinal: Negative for diarrhea, blood in stool, abdominal distention and rectal pain.  Genitourinary: Negative for hematuria, flank pain and decreased urine volume.  Musculoskeletal: Negative for myalgias and joint swelling.  Skin: Negative for color change and wound.  Neurological: Negative for syncope and numbness.  Hematological: Negative for adenopathy.  Psychiatric/Behavioral: Negative for hallucinations, self-injury, decreased concentration and agitation.      Objective:   Physical Exam BP 102/70  Pulse 74  Temp 98.6 F (37 C) (Oral)  Ht 5' 6.5" (1.689 m)  Wt 203 lb 2 oz (92.137 kg)  BMI 32.29 kg/m2  SpO2 98%  LMP 09/11/2012 Physical Exam  VS noted Constitutional: Pt is oriented to person, place, and time.  Appears well-developed and well-nourished.  HENT:  Head: Normocephalic and atraumatic.  Right Ear: External ear normal.  Left Ear: External ear normal.  Nose: Nose normal.  Mouth/Throat: Oropharynx is clear and moist.  Eyes: Conjunctivae and EOM are normal. Pupils are equal, round, and reactive to light.  Neck: Normal range of motion. Neck supple. No JVD present. No tracheal deviation present.  Cardiovascular: Normal rate, regular  rhythm, normal heart sounds and intact distal pulses.   Pulmonary/Chest: Effort normal and breath sounds normal.  Abdominal: Soft. Bowel sounds are normal. There is no tenderness.  Musculoskeletal: Normal range of motion. Exhibits no edema.  Lymphadenopathy:  Has no cervical adenopathy.  Neurological: Pt is alert and oriented to person, place, and time. Pt has normal reflexes. No cranial nerve deficit.  Skin: Skin is warm and dry. No rash noted.  Psychiatric:  Has dysphoric mood. Behavior is normal.     Assessment & Plan:

## 2012-09-26 ENCOUNTER — Encounter: Payer: Self-pay | Admitting: Internal Medicine

## 2012-09-26 NOTE — Assessment & Plan Note (Signed)

## 2012-10-02 HISTORY — PX: BREAST EXCISIONAL BIOPSY: SUR124

## 2012-10-08 ENCOUNTER — Ambulatory Visit
Admission: RE | Admit: 2012-10-08 | Discharge: 2012-10-08 | Disposition: A | Discharge status: Home / Self Care | Payer: Medicare PPO | Source: Ambulatory Visit | Attending: Obstetrics and Gynecology | Admitting: Obstetrics and Gynecology

## 2012-10-08 DIAGNOSIS — R928 Other abnormal and inconclusive findings on diagnostic imaging of breast: Secondary | ICD-10-CM

## 2012-10-14 ENCOUNTER — Ambulatory Visit (INDEPENDENT_AMBULATORY_CARE_PROVIDER_SITE_OTHER): Payer: Medicare PPO | Admitting: General Surgery

## 2012-10-18 ENCOUNTER — Ambulatory Visit (INDEPENDENT_AMBULATORY_CARE_PROVIDER_SITE_OTHER): Payer: Medicare PPO | Admitting: General Surgery

## 2012-10-18 ENCOUNTER — Encounter (INDEPENDENT_AMBULATORY_CARE_PROVIDER_SITE_OTHER): Payer: Self-pay | Admitting: General Surgery

## 2012-10-18 VITALS — BP 100/68 | HR 74 | Resp 16 | Ht 66.5 in | Wt 204.0 lb

## 2012-10-18 DIAGNOSIS — R928 Other abnormal and inconclusive findings on diagnostic imaging of breast: Secondary | ICD-10-CM

## 2012-10-18 DIAGNOSIS — R921 Mammographic calcification found on diagnostic imaging of breast: Secondary | ICD-10-CM

## 2012-10-18 NOTE — Progress Notes (Signed)
Patient ID: Lauren Wolfe, female   DOB: 11/29/1975, 37 y.o.   MRN: [id number]  Chief Complaint  Patient presents with  . Other    Eval right br     HPI Lauren Wolfe is a 37 y.o. female.  Referred by Dr. Deboraha Sprang HPI This is a 59 through female who has a history of chronic migraines but is otherwise healthy. She cannot take any narcotic pain medication. She has a history of breast cancer in a grandmother at age 69 and a cousin age 49. She does not have any complaints referable to her breasts at all. She underwent a mammogram where she has right subareolar calcifications. These were not amenable to stereotactic biopsy. She's been referred for evaluation for excision due to the appearance of these calcifications. Past Medical History  Diagnosis Date  . Migraine   . Anxiety   . Depression   . HTN (hypertension)     Past Surgical History  Procedure Date  . Ganglion cysts 2011    Family History  Problem Relation Age of Onset  . Other Other     grandparent with DJD  . Hypertension Other     grandparent  . Diabetes Other     grandparent  . Hypertension Mother   . Aneurysm Mother   . Gout Other     grandfather  . Breast cancer Other     grandmother    Social History History  Substance Use Topics  . Smoking status: Never Smoker   . Smokeless tobacco: Never Used  . Alcohol Use: No    Allergies  Allergen Reactions  . Prednisone     SOB  . Azithromycin     REACTION: rash, prurutis  . Codeine   . Hydrocodone   . Ketorolac Tromethamine     Current Outpatient Prescriptions  Medication Sig Dispense Refill  . etonogestrel-ethinyl estradiol (NUVARING) 0.12-0.015 MG/24HR vaginal ring Place 1 each vaginally every 28 (twenty-eight) days. Insert vaginally and leave in place for 3 consecutive weeks, then remove for 1 week.       . naproxen (NAPROSYN) 500 MG tablet Take 500 mg by mouth. 1 tablet by mouth 2-3 times a week       . promethazine (PHENERGAN) 25 MG tablet Take  25 mg by mouth as needed.          Review of Systems Review of Systems  Constitutional: Negative for fever, chills and unexpected weight change.  HENT: Negative for hearing loss, congestion, sore throat, trouble swallowing and voice change.   Eyes: Negative for visual disturbance.  Respiratory: Negative for cough and wheezing.   Cardiovascular: Negative for chest pain, palpitations and leg swelling.  Gastrointestinal: Negative for nausea, vomiting, abdominal pain, diarrhea, constipation, blood in stool, abdominal distention and anal bleeding.  Genitourinary: Negative for hematuria, vaginal bleeding and difficulty urinating.  Musculoskeletal: Negative for arthralgias.  Skin: Negative for rash and wound.  Neurological: Positive for headaches. Negative for seizures and syncope.  Hematological: Negative for adenopathy. Does not bruise/bleed easily.  Psychiatric/Behavioral: Negative for confusion.    Blood pressure 100/68, pulse 74, resp. rate 16, height 5' 6.5" (1.689 m), weight 204 lb (92.534 kg), last menstrual period 09/11/2012.  Physical Exam Physical Exam  Vitals reviewed. Constitutional: She appears well-developed and well-nourished.  Neck: Neck supple.  Cardiovascular: Normal rate, regular rhythm and normal heart sounds.   Pulmonary/Chest: Effort normal and breath sounds normal. She has no wheezes. She has no rales. Right breast exhibits no inverted nipple,  no mass, no nipple discharge, no skin change and no tenderness. Left breast exhibits no inverted nipple, no mass, no nipple discharge, no skin change and no tenderness. Breasts are symmetrical.  Lymphadenopathy:    She has no cervical adenopathy.    Data Reviewed DIGITAL DIAGNOSTIC RIGHT MAMMOGRAM  Comparison: None.  Findings:  ACR Breast Density Category 2: There are scattered fibroglandular  densities.  Magnification views demonstrate calcifications at 12 o'clock in the  right subareolar region. There is at least one  linear form.  These calcifications are viewed with some suspicion. The location  of the calcifications immediately behind the nipple precludes  stereotactic biopsy. Surgical consultation for consideration of  excision is recommended.  Mammographic images were processed with CAD.  IMPRESSION:  Right subareolar calcifications with slightly suspicious  configuration.  RECOMMENDATION:  Surgical consultation for consideration of excision is recommended.  This has been scheduled with Dr. Dwain Sarna for 10/14/2012.  I have discussed the findings and recommendations with the patient.  Results were also provided in writing at the conclusion of the  visit.   Assessment    Right breast calcifications not amenable to stereotactic biopsy    Plan    These calcifications are not amenable to stereotactic biopsy. She has a significant family history as well. I discussed with her today a right breast wire-guided excisional biopsy. We discussed the risks of bleeding, infection. We discussed her postoperative course as well as a restrictions. We'll plan on getting her scheduled as soon as possible.       Hassen Bruun 10/18/2012, 9:20 AM

## 2012-10-25 ENCOUNTER — Encounter (HOSPITAL_BASED_OUTPATIENT_CLINIC_OR_DEPARTMENT_OTHER): Payer: Self-pay | Admitting: *Deleted

## 2012-10-25 NOTE — Progress Notes (Signed)
Bring medications.

## 2012-10-31 ENCOUNTER — Encounter (HOSPITAL_BASED_OUTPATIENT_CLINIC_OR_DEPARTMENT_OTHER)
Admission: RE | Disposition: A | Discharge status: Home / Self Care | Payer: Self-pay | Source: Ambulatory Visit | Attending: General Surgery

## 2012-10-31 ENCOUNTER — Encounter (HOSPITAL_BASED_OUTPATIENT_CLINIC_OR_DEPARTMENT_OTHER): Payer: Self-pay | Admitting: Anesthesiology

## 2012-10-31 ENCOUNTER — Ambulatory Visit (HOSPITAL_BASED_OUTPATIENT_CLINIC_OR_DEPARTMENT_OTHER)
Admission: RE | Admit: 2012-10-31 | Discharge: 2012-10-31 | Disposition: A | Discharge status: Home / Self Care | Payer: Medicare PPO | Source: Ambulatory Visit | Attending: General Surgery | Admitting: General Surgery

## 2012-10-31 ENCOUNTER — Ambulatory Visit
Admission: RE | Admit: 2012-10-31 | Discharge: 2012-10-31 | Disposition: A | Discharge status: Home / Self Care | Payer: Medicare PPO | Source: Ambulatory Visit | Attending: General Surgery | Admitting: General Surgery

## 2012-10-31 ENCOUNTER — Ambulatory Visit (HOSPITAL_BASED_OUTPATIENT_CLINIC_OR_DEPARTMENT_OTHER): Payer: Medicare PPO | Admitting: Anesthesiology

## 2012-10-31 ENCOUNTER — Encounter (HOSPITAL_BASED_OUTPATIENT_CLINIC_OR_DEPARTMENT_OTHER): Payer: Self-pay | Admitting: *Deleted

## 2012-10-31 DIAGNOSIS — I10 Essential (primary) hypertension: Secondary | ICD-10-CM | POA: Insufficient documentation

## 2012-10-31 DIAGNOSIS — R920 Mammographic microcalcification found on diagnostic imaging of breast: Secondary | ICD-10-CM | POA: Insufficient documentation

## 2012-10-31 DIAGNOSIS — N6029 Fibroadenosis of unspecified breast: Secondary | ICD-10-CM | POA: Insufficient documentation

## 2012-10-31 DIAGNOSIS — Z803 Family history of malignant neoplasm of breast: Secondary | ICD-10-CM | POA: Insufficient documentation

## 2012-10-31 DIAGNOSIS — N6089 Other benign mammary dysplasias of unspecified breast: Secondary | ICD-10-CM | POA: Insufficient documentation

## 2012-10-31 DIAGNOSIS — R921 Mammographic calcification found on diagnostic imaging of breast: Secondary | ICD-10-CM

## 2012-10-31 DIAGNOSIS — N6019 Diffuse cystic mastopathy of unspecified breast: Secondary | ICD-10-CM

## 2012-10-31 HISTORY — DX: Presence of spectacles and contact lenses: Z97.3

## 2012-10-31 HISTORY — PX: BREAST BIOPSY: SHX20

## 2012-10-31 SURGERY — BREAST BIOPSY WITH NEEDLE LOCALIZATION
Anesthesia: General | Site: Breast | Laterality: Right | Wound class: Clean

## 2012-10-31 MED ORDER — CEFAZOLIN SODIUM-DEXTROSE 2-3 GM-% IV SOLR: INTRAVENOUS | Status: DC | PRN

## 2012-10-31 MED ORDER — MEPERIDINE HCL 25 MG/ML IJ SOLN: 6.2500 mg | INTRAMUSCULAR | Status: DC | PRN

## 2012-10-31 MED ORDER — ONDANSETRON HCL 4 MG/2ML IJ SOLN
INTRAMUSCULAR | Status: DC | PRN
  Administered 2012-10-31: 4 mg via INTRAVENOUS

## 2012-10-31 MED ORDER — MIDAZOLAM HCL 2 MG/2ML IJ SOLN: 1.0000 mg | INTRAMUSCULAR | Status: DC | PRN

## 2012-10-31 MED ORDER — LIDOCAINE HCL (CARDIAC) 20 MG/ML IV SOLN
INTRAVENOUS | Status: DC | PRN
  Administered 2012-10-31: 100 mg via INTRAVENOUS

## 2012-10-31 MED ORDER — DEXAMETHASONE SODIUM PHOSPHATE 4 MG/ML IJ SOLN
INTRAMUSCULAR | Status: DC | PRN
  Administered 2012-10-31: 8 mg via INTRAVENOUS

## 2012-10-31 MED ORDER — HYDROMORPHONE HCL PF 1 MG/ML IJ SOLN: 0.2500 mg | INTRAMUSCULAR | Status: DC | PRN

## 2012-10-31 MED ORDER — PROPOFOL 10 MG/ML IV BOLUS
INTRAVENOUS | Status: DC | PRN
  Administered 2012-10-31: 150 mg via INTRAVENOUS

## 2012-10-31 MED ORDER — CEFAZOLIN SODIUM-DEXTROSE 2-3 GM-% IV SOLR
2.0000 g | INTRAVENOUS | Status: AC
  Administered 2012-10-31: 2 g via INTRAVENOUS

## 2012-10-31 MED ORDER — LACTATED RINGERS IV SOLN
INTRAVENOUS | Status: DC | PRN
  Administered 2012-10-31: 14:00:00 via INTRAVENOUS

## 2012-10-31 MED ORDER — LACTATED RINGERS IV SOLN: INTRAVENOUS | Status: DC

## 2012-10-31 MED ORDER — BUPIVACAINE HCL (PF) 0.25 % IJ SOLN
INTRAMUSCULAR | Status: DC | PRN
  Administered 2012-10-31: 11.5 mL

## 2012-10-31 MED ORDER — FENTANYL CITRATE 0.05 MG/ML IJ SOLN
INTRAMUSCULAR | Status: DC | PRN
  Administered 2012-10-31: 100 ug via INTRAVENOUS
  Administered 2012-10-31: 50 ug via INTRAVENOUS

## 2012-10-31 MED ORDER — FENTANYL CITRATE 0.05 MG/ML IJ SOLN: 50.0000 ug | INTRAMUSCULAR | Status: DC | PRN

## 2012-10-31 MED ORDER — ONDANSETRON HCL 4 MG/2ML IJ SOLN
4.0000 mg | Freq: Once | INTRAMUSCULAR | Status: AC | PRN
  Administered 2012-10-31: 4 mg via INTRAVENOUS

## 2012-10-31 MED ORDER — OXYCODONE HCL 5 MG PO TABS
5.0000 mg | ORAL_TABLET | Freq: Once | ORAL | Status: DC | PRN
Start: 2012-10-31 — End: 2012-10-31

## 2012-10-31 MED ORDER — OXYCODONE HCL 5 MG/5ML PO SOLN: 5.0000 mg | Freq: Once | ORAL | Status: DC | PRN

## 2012-10-31 MED ORDER — TRAMADOL HCL 50 MG PO TABS: 50.0000 mg | ORAL_TABLET | Freq: Four times a day (QID) | ORAL | Status: DC | PRN

## 2012-10-31 MED ORDER — MIDAZOLAM HCL 5 MG/5ML IJ SOLN
INTRAMUSCULAR | Status: DC | PRN
  Administered 2012-10-31: 2 mg via INTRAVENOUS

## 2012-10-31 SURGICAL SUPPLY — 51 items
APPLIER CLIP 9.375 MED OPEN (MISCELLANEOUS)
BENZOIN TINCTURE PRP APPL 2/3 (GAUZE/BANDAGES/DRESSINGS) ×2 IMPLANT
BINDER BREAST LRG (GAUZE/BANDAGES/DRESSINGS) IMPLANT
BINDER BREAST MEDIUM (GAUZE/BANDAGES/DRESSINGS) IMPLANT
BINDER BREAST XLRG (GAUZE/BANDAGES/DRESSINGS) IMPLANT
BINDER BREAST XXLRG (GAUZE/BANDAGES/DRESSINGS) IMPLANT
BLADE SURG 15 STRL LF DISP TIS (BLADE) ×1 IMPLANT
BLADE SURG 15 STRL SS (BLADE) ×1
CANISTER SUCTION 1200CC (MISCELLANEOUS) ×2 IMPLANT
CHLORAPREP W/TINT 26ML (MISCELLANEOUS) ×2 IMPLANT
CLIP APPLIE 9.375 MED OPEN (MISCELLANEOUS) IMPLANT
CLOTH BEACON ORANGE TIMEOUT ST (SAFETY) ×2 IMPLANT
COVER MAYO STAND STRL (DRAPES) ×2 IMPLANT
COVER TABLE BACK 60X90 (DRAPES) ×2 IMPLANT
DECANTER SPIKE VIAL GLASS SM (MISCELLANEOUS) ×2 IMPLANT
DERMABOND ADVANCED (GAUZE/BANDAGES/DRESSINGS) ×1
DERMABOND ADVANCED .7 DNX12 (GAUZE/BANDAGES/DRESSINGS) ×1 IMPLANT
DEVICE DUBIN W/COMP PLATE 8390 (MISCELLANEOUS) ×2 IMPLANT
DRAPE PED LAPAROTOMY (DRAPES) ×2 IMPLANT
DRSG TEGADERM 4X4.75 (GAUZE/BANDAGES/DRESSINGS) ×2 IMPLANT
ELECT COATED BLADE 2.86 ST (ELECTRODE) ×2 IMPLANT
ELECT REM PT RETURN 9FT ADLT (ELECTROSURGICAL) ×2
ELECTRODE REM PT RTRN 9FT ADLT (ELECTROSURGICAL) ×1 IMPLANT
GAUZE SPONGE 4X4 12PLY STRL LF (GAUZE/BANDAGES/DRESSINGS) ×2 IMPLANT
GLOVE BIO SURGEON STRL SZ7 (GLOVE) ×2 IMPLANT
GLOVE BIOGEL PI IND STRL 7.5 (GLOVE) ×1 IMPLANT
GLOVE BIOGEL PI INDICATOR 7.5 (GLOVE) ×1
GLOVE SURG SS PI 7.0 STRL IVOR (GLOVE) ×2 IMPLANT
GOWN PREVENTION PLUS XLARGE (GOWN DISPOSABLE) ×4 IMPLANT
NEEDLE HYPO 25X1 1.5 SAFETY (NEEDLE) ×2 IMPLANT
NS IRRIG 1000ML POUR BTL (IV SOLUTION) ×2 IMPLANT
PACK BASIN DAY SURGERY FS (CUSTOM PROCEDURE TRAY) ×2 IMPLANT
PENCIL BUTTON HOLSTER BLD 10FT (ELECTRODE) ×2 IMPLANT
SLEEVE SCD COMPRESS KNEE MED (MISCELLANEOUS) ×2 IMPLANT
SPONGE LAP 4X18 X RAY DECT (DISPOSABLE) ×2 IMPLANT
STRIP CLOSURE SKIN 1/2X4 (GAUZE/BANDAGES/DRESSINGS) ×2 IMPLANT
SUT MNCRL AB 4-0 PS2 18 (SUTURE) ×2 IMPLANT
SUT MON AB 5-0 PS2 18 (SUTURE) IMPLANT
SUT SILK 2 0 SH (SUTURE) ×2 IMPLANT
SUT VIC AB 2-0 SH 27 (SUTURE) ×1
SUT VIC AB 2-0 SH 27XBRD (SUTURE) ×1 IMPLANT
SUT VIC AB 3-0 SH 27 (SUTURE) ×1
SUT VIC AB 3-0 SH 27X BRD (SUTURE) ×1 IMPLANT
SUT VIC AB 5-0 PS2 18 (SUTURE) IMPLANT
SUT VICRYL AB 3 0 TIES (SUTURE) IMPLANT
SYR CONTROL 10ML LL (SYRINGE) ×2 IMPLANT
TOWEL OR 17X24 6PK STRL BLUE (TOWEL DISPOSABLE) ×2 IMPLANT
TOWEL OR NON WOVEN STRL DISP B (DISPOSABLE) ×2 IMPLANT
TUBE CONNECTING 20X1/4 (TUBING) ×2 IMPLANT
WATER STERILE IRR 1000ML POUR (IV SOLUTION) ×2 IMPLANT
YANKAUER SUCT BULB TIP NO VENT (SUCTIONS) ×2 IMPLANT

## 2012-10-31 NOTE — Anesthesia Preprocedure Evaluation (Addendum)
Anesthesia Evaluation  Patient identified by MRN, date of birth, ID band Patient awake    Reviewed: Allergy & Precautions, H&P , NPO status , Patient's Chart, lab work & pertinent test results  Airway Mallampati: II TM Distance: >3 FB Neck ROM: Full    Dental   Pulmonary          Cardiovascular hypertension, Pt. on medications     Neuro/Psych    GI/Hepatic negative GI ROS, Neg liver ROS,   Endo/Other  negative endocrine ROS  Renal/GU negative Renal ROS     Musculoskeletal   Abdominal   Peds  Hematology   Anesthesia Other Findings   Reproductive/Obstetrics                          Anesthesia Physical Anesthesia Plan  ASA: III  Anesthesia Plan: General   Post-op Pain Management:    Induction: Intravenous  Airway Management Planned: LMA  Additional Equipment:   Intra-op Plan:   Post-operative Plan: Extubation in OR  Informed Consent: I have reviewed the patients History and Physical, chart, labs and discussed the procedure including the risks, benefits and alternatives for the proposed anesthesia with the patient or authorized representative who has indicated his/her understanding and acceptance.   Dental advisory given  Plan Discussed with: CRNA and Surgeon  Anesthesia Plan Comments:        Anesthesia Quick Evaluation

## 2012-10-31 NOTE — Anesthesia Postprocedure Evaluation (Signed)
Anesthesia Post Note  Patient: Lauren Wolfe  Procedure(s) Performed: Procedure(s) (LRB): BREAST BIOPSY WITH NEEDLE LOCALIZATION (Right)  Anesthesia type: general  Patient location: PACU  Post pain: Pain level controlled  Post assessment: Patient's Cardiovascular Status Stable  Last Vitals:  Filed Vitals:   10/31/12 1530  BP: 144/101  Pulse: 75  Temp:   Resp: 15    Post vital signs: Reviewed and stable  Level of consciousness: sedated  Complications: No apparent anesthesia complications

## 2012-10-31 NOTE — Op Note (Signed)
Preoperative diagnosis: Right breast subareolar microcalcifications not amenable to core biopsy Postoperative diagnosis: Same as above Procedure: Right breast wire-guided excisional biopsy Surgeon: Dr. Dwain Sarna Anesthesia: Gen. With LMA Specimens: Right breast marked short stitch superior, long stitch lateral, double stitch deep Estimated blood loss: Minimal Drains: None Complications: None Sponge count correct at end of operation Disposition to recovery stable  Indications: This a 37 yo female who underwent a screening mammogram with the finding of abnormal microcalcifications in the right breast subareolar position. These were not amenable to core biopsy. She was then referred for excisional biopsy. We discussed all options and decided to proceed with a wire-guided excisional biopsy of these microcalcifications.  Procedure: After informed was obtained the patient was first taken the breast center where she had a wire placed by Dr. Manson Passey. I had these mammograms in the operating room. She was then taken to the operating room and placed under general anesthesia with an LMA. She was then prepped and draped in the standard sterile surgical fashion. A surgical timeout was performed.  I injected Marcaine throughout the area. I then made a periareolar incision. I then used cautery to excise the wire and the surrounding tissue which went up right underneath the skin of the nipple. I then marked this. I passed off the table. A mammogram was taken confirming removal of the wire and the calcifications. This was also confirmed by Dr. Manson Passey. Hemostasis was obtained. I closed this with 3-0 Vicryl. I closed the skin with 5-0 Monocryl. I placed Dermabond and Steri-Strips over the wound. She tolerated this well is extubated transferred to the recovery room in stable condition.

## 2012-10-31 NOTE — Discharge Instructions (Signed)
[facility] °Office Phone Number [phone] ° °BREAST BIOPSY/ PARTIAL MASTECTOMY: POST OP INSTRUCTIONS ° °Always review your discharge instruction sheet given to you by the facility where your surgery was performed. ° °IF YOU HAVE DISABILITY OR FAMILY LEAVE FORMS, YOU MUST BRING THEM TO THE OFFICE FOR PROCESSING.  DO NOT GIVE THEM TO YOUR DOCTOR. ° °1. A prescription for pain medication may be given to you upon discharge.  Take your pain medication as prescribed, if needed.  If narcotic pain medicine is not needed, then you may take acetaminophen (Tylenol), naprosyn (Alleve) or ibuprofen (Advil) as needed. °2. Take your usually prescribed medications unless otherwise directed °3. If you need a refill on your pain medication, please contact your pharmacy.  They will contact our office to request authorization.  Prescriptions will not be filled after 5pm or on week-ends. °4. You should eat very light the first 24 hours after surgery, such as soup, crackers, pudding, etc.  Resume your normal diet the day after surgery. °5. Most patients will experience some swelling and bruising in the breast.  Ice packs and a good support bra will help.  Wear the breast binder provided or a sports bra for 72 hours day and night.  After that wear a sports bra during the day until you return to the office. Swelling and bruising can take several days to resolve.  °6. It is common to experience some constipation if taking pain medication after surgery.  Increasing fluid intake and taking a stool softener will usually help or prevent this problem from occurring.  A mild laxative (Milk of Magnesia or Miralax) should be taken according to package directions if there are no bowel movements after 48 hours. °7. Unless discharge instructions indicate otherwise, you may remove your bandages 48 hours after surgery and you may shower at that time.  You may have steri-strips (small skin tapes) in place directly over the incision.   These strips should be left on the skin for 7-10 days and will come off on their own.  If your surgeon used skin glue on the incision, you may shower in 24 hours.  The glue will flake off over the next 2-3 weeks.  Any sutures or staples will be removed at the office during your follow-up visit. °8. ACTIVITIES:  You may resume regular daily activities (gradually increasing) beginning the next day.  Wearing a good support bra or sports bra minimizes pain and swelling.  You may have sexual intercourse when it is comfortable. °a. You may drive when you no longer are taking prescription pain medication, you can comfortably wear a seatbelt, and you can safely maneuver your car and apply brakes. °b. RETURN TO WORK:  ______________________________________________________________________________________ °9. You should see your doctor in the office for a follow-up appointment approximately two weeks after your surgery.  Your doctor’s nurse will typically make your follow-up appointment when she calls you with your pathology report.  Expect your pathology report 3-4 business days after your surgery.  You may call to check if you do not hear from us after three days. °10. OTHER INSTRUCTIONS: _______________________________________________________________________________________________ _____________________________________________________________________________________________________________________________________ °_____________________________________________________________________________________________________________________________________ °_____________________________________________________________________________________________________________________________________ ° °WHEN TO CALL DR WAKEFIELD: °1. Fever over 101.0 °2. Nausea and/or vomiting. °3. Extreme swelling or bruising. °4. Continued bleeding from incision. °5. Increased pain, redness, or drainage from the incision. ° °The clinic staff is available to  answer your questions during regular business hours.  Please don’t hesitate to call and ask to speak to one of the nurses for   clinical concerns.  If you have a medical emergency, go to the nearest emergency room or call 911.  A surgeon from [facility] is always on call at the hospital. ° °For further questions, please visit [email or web address] ° °Post Anesthesia Home Care Instructions ° °Activity: °Get plenty of rest for the remainder of the day. A responsible adult should stay with you for 24 hours following the procedure.  °For the next 24 hours, DO NOT: °-Drive a car °-Operate machinery °-Drink alcoholic beverages °-Take any medication unless instructed by your physician °-Make any legal decisions or sign important papers. ° °Meals: °Start with liquid foods such as gelatin or soup. Progress to regular foods as tolerated. Avoid greasy, spicy, heavy foods. If nausea and/or vomiting occur, drink only clear liquids until the nausea and/or vomiting subsides. Call your physician if vomiting continues. ° °Special Instructions/Symptoms: °Your throat may feel dry or sore from the anesthesia or the breathing tube placed in your throat during surgery. If this causes discomfort, gargle with warm salt water. The discomfort should disappear within 24 hours. ° °

## 2012-10-31 NOTE — Anesthesia Procedure Notes (Signed)
Procedure Name: LMA Insertion Performed by: Lance Coon Pre-anesthesia Checklist: Patient identified, Timeout performed, Emergency Drugs available, Suction available and Patient being monitored Patient Re-evaluated:Patient Re-evaluated prior to inductionOxygen Delivery Method: Circle system utilized Preoxygenation: Pre-oxygenation with 100% oxygen Intubation Type: IV induction Ventilation: Mask ventilation without difficulty LMA: LMA inserted LMA Size: 4.0 Number of attempts: 1 Placement Confirmation: breath sounds checked- equal and bilateral and positive ETCO2 Tube secured with: Tape Dental Injury: Teeth and Oropharynx as per pre-operative assessment

## 2012-10-31 NOTE — Interval H&P Note (Signed)
History and Physical Interval Note:  10/31/2012 1:21 PM  Lauren Wolfe  has presented today for surgery, with the diagnosis of right breast calcifications  The various methods of treatment have been discussed with the patient and family. After consideration of risks, benefits and other options for treatment, the patient has consented to  Procedure(s) (LRB) with comments: BREAST BIOPSY WITH NEEDLE LOCALIZATION (Right) - right breast wire guided excisional biopsy as a surgical intervention .  The patient's history has been reviewed, patient examined, no change in status, stable for surgery.  I have reviewed the patient's chart and labs.  Questions were answered to the patient's satisfaction.     Laurrie Toppin

## 2012-10-31 NOTE — Transfer of Care (Signed)
Immediate Anesthesia Transfer of Care Note  Patient: Lauren Wolfe  Procedure(s) Performed: Procedure(s) (LRB) with comments: BREAST BIOPSY WITH NEEDLE LOCALIZATION (Right) - right breast wire guided excisional biopsy  Patient Location: PACU  Anesthesia Type:General  Level of Consciousness: awake  Airway & Oxygen Therapy: Patient Spontanous Breathing and Patient connected to face mask oxygen  Post-op Assessment: Report given to PACU RN and Post -op Vital signs reviewed and stable  Post vital signs: Reviewed and stable  Complications: No apparent anesthesia complications

## 2012-10-31 NOTE — H&P (View-Only) (Signed)
Patient ID: Lauren Wolfe, female   DOB: 07/02/1976, 37 y.o.   MRN: [id number]  Chief Complaint  Patient presents with  . Other    Eval right br     HPI Lauren Wolfe is a 37 y.o. female.  Referred by Dr. Eagle HPI This is a 37 through female who has a history of chronic migraines but is otherwise healthy. She cannot take any narcotic pain medication. She has a history of breast cancer in a grandmother at age 50 and a cousin age 48. She does not have any complaints referable to her breasts at all. She underwent a mammogram where she has right subareolar calcifications. These were not amenable to stereotactic biopsy. She's been referred for evaluation for excision due to the appearance of these calcifications. Past Medical History  Diagnosis Date  . Migraine   . Anxiety   . Depression   . HTN (hypertension)     Past Surgical History  Procedure Date  . Ganglion cysts 2011    Family History  Problem Relation Age of Onset  . Other Other     grandparent with DJD  . Hypertension Other     grandparent  . Diabetes Other     grandparent  . Hypertension Mother   . Aneurysm Mother   . Gout Other     grandfather  . Breast cancer Other     grandmother    Social History History  Substance Use Topics  . Smoking status: Never Smoker   . Smokeless tobacco: Never Used  . Alcohol Use: No    Allergies  Allergen Reactions  . Prednisone     SOB  . Azithromycin     REACTION: rash, prurutis  . Codeine   . Hydrocodone   . Ketorolac Tromethamine     Current Outpatient Prescriptions  Medication Sig Dispense Refill  . etonogestrel-ethinyl estradiol (NUVARING) 0.12-0.015 MG/24HR vaginal ring Place 1 each vaginally every 28 (twenty-eight) days. Insert vaginally and leave in place for 3 consecutive weeks, then remove for 1 week.       . naproxen (NAPROSYN) 500 MG tablet Take 500 mg by mouth. 1 tablet by mouth 2-3 times a week       . promethazine (PHENERGAN) 25 MG tablet Take  25 mg by mouth as needed.          Review of Systems Review of Systems  Constitutional: Negative for fever, chills and unexpected weight change.  HENT: Negative for hearing loss, congestion, sore throat, trouble swallowing and voice change.   Eyes: Negative for visual disturbance.  Respiratory: Negative for cough and wheezing.   Cardiovascular: Negative for chest pain, palpitations and leg swelling.  Gastrointestinal: Negative for nausea, vomiting, abdominal pain, diarrhea, constipation, blood in stool, abdominal distention and anal bleeding.  Genitourinary: Negative for hematuria, vaginal bleeding and difficulty urinating.  Musculoskeletal: Negative for arthralgias.  Skin: Negative for rash and wound.  Neurological: Positive for headaches. Negative for seizures and syncope.  Hematological: Negative for adenopathy. Does not bruise/bleed easily.  Psychiatric/Behavioral: Negative for confusion.    Blood pressure 100/68, pulse 74, resp. rate 16, height 5' 6.5" (1.689 m), weight 204 lb (92.534 kg), last menstrual period 09/11/2012.  Physical Exam Physical Exam  Vitals reviewed. Constitutional: She appears well-developed and well-nourished.  Neck: Neck supple.  Cardiovascular: Normal rate, regular rhythm and normal heart sounds.   Pulmonary/Chest: Effort normal and breath sounds normal. She has no wheezes. She has no rales. Right breast exhibits no inverted nipple,   no mass, no nipple discharge, no skin change and no tenderness. Left breast exhibits no inverted nipple, no mass, no nipple discharge, no skin change and no tenderness. Breasts are symmetrical.  Lymphadenopathy:    She has no cervical adenopathy.    Data Reviewed DIGITAL DIAGNOSTIC RIGHT MAMMOGRAM  Comparison: None.  Findings:  ACR Breast Density Category 2: There are scattered fibroglandular  densities.  Magnification views demonstrate calcifications at 12 o'clock in the  right subareolar region. There is at least one  linear form.  These calcifications are viewed with some suspicion. The location  of the calcifications immediately behind the nipple precludes  stereotactic biopsy. Surgical consultation for consideration of  excision is recommended.  Mammographic images were processed with CAD.  IMPRESSION:  Right subareolar calcifications with slightly suspicious  configuration.  RECOMMENDATION:  Surgical consultation for consideration of excision is recommended.  This has been scheduled with Dr. Denzal Meir for 10/14/2012.  I have discussed the findings and recommendations with the patient.  Results were also provided in writing at the conclusion of the  visit.   Assessment    Right breast calcifications not amenable to stereotactic biopsy    Plan    These calcifications are not amenable to stereotactic biopsy. She has a significant family history as well. I discussed with her today a right breast wire-guided excisional biopsy. We discussed the risks of bleeding, infection. We discussed her postoperative course as well as a restrictions. We'll plan on getting her scheduled as soon as possible.       Lauren Wolfe 10/18/2012, 9:20 AM    

## 2012-11-01 ENCOUNTER — Encounter (HOSPITAL_BASED_OUTPATIENT_CLINIC_OR_DEPARTMENT_OTHER): Payer: Self-pay | Admitting: General Surgery

## 2012-11-04 ENCOUNTER — Telehealth (INDEPENDENT_AMBULATORY_CARE_PROVIDER_SITE_OTHER): Payer: Self-pay

## 2012-11-04 NOTE — Telephone Encounter (Signed)
Patient called back and was given path result.

## 2012-11-04 NOTE — Telephone Encounter (Signed)
Tried to call pt but can't leave messages. I was going to notify her that her path is benign per Dr Dwain Sarna.

## 2012-11-15 ENCOUNTER — Encounter (INDEPENDENT_AMBULATORY_CARE_PROVIDER_SITE_OTHER): Payer: Medicare PPO | Admitting: General Surgery

## 2012-11-16 ENCOUNTER — Other Ambulatory Visit: Payer: Self-pay

## 2012-11-19 ENCOUNTER — Encounter: Payer: Managed Care, Other (non HMO) | Admitting: Internal Medicine

## 2012-11-19 ENCOUNTER — Encounter (INDEPENDENT_AMBULATORY_CARE_PROVIDER_SITE_OTHER): Payer: Self-pay | Admitting: General Surgery

## 2012-11-19 ENCOUNTER — Ambulatory Visit (INDEPENDENT_AMBULATORY_CARE_PROVIDER_SITE_OTHER): Payer: Medicare PPO | Admitting: General Surgery

## 2012-11-19 VITALS — BP 122/68 | HR 76 | Temp 97.1°F | Resp 16 | Ht 66.0 in | Wt 207.4 lb

## 2012-11-19 DIAGNOSIS — Z09 Encounter for follow-up examination after completed treatment for conditions other than malignant neoplasm: Secondary | ICD-10-CM

## 2012-11-19 NOTE — Progress Notes (Signed)
Subjective:     Patient ID: Lauren Wolfe, female   DOB: 18-Mar-1976, 37 y.o.   MRN: [id number]  HPI  This is a 37 her old female who had right breast subareolar calcifications that were not amenable to stereotactic biopsy. She then underwent a wire localized excisional biopsy with pathology showing microcalcifications in fibrocystic change. This was consistent with what appeared to be a mammography in the appropriate area was removed. She returns today with some pain at the site but is otherwise doing very well. Review of Systems     Objective:   Physical Exam    right breast incision without infection Assessment:     S/p right breast biopsy     Plan:     We discussed her pathology today. This area is pretty much healed already. I released to full activity. I told her normal breast screening measures with yearly mammograms, yearly clinical exams, and monthly self exams. I will see her back as needed.

## 2012-11-19 NOTE — Patient Instructions (Signed)

## 2012-12-09 ENCOUNTER — Ambulatory Visit (INDEPENDENT_AMBULATORY_CARE_PROVIDER_SITE_OTHER): Payer: Medicare PPO | Admitting: Internal Medicine

## 2012-12-09 ENCOUNTER — Encounter: Payer: Self-pay | Admitting: Internal Medicine

## 2012-12-09 VITALS — BP 112/74 | HR 81 | Temp 98.2°F

## 2012-12-09 DIAGNOSIS — J111 Influenza due to unidentified influenza virus with other respiratory manifestations: Secondary | ICD-10-CM

## 2012-12-09 MED ORDER — OSELTAMIVIR PHOSPHATE 75 MG PO CAPS: 75.0000 mg | ORAL_CAPSULE | Freq: Two times a day (BID) | ORAL | Status: DC

## 2012-12-09 NOTE — Progress Notes (Signed)
HPI  Pt presents to the clinic today with c/o cough, fever and chills x 1 day. This started last night. She felt fine all day yesterday and then all of a sudden felt really bad last night. She has taken tylenol without relief. She has not had her flu shot. She has been in close contact with people who have had the flu. She denies any nausea, vomiting or diarrhea. She is having chills followed by sweats. She has not actually taken her temperature.  Review of Systems      Past Medical History  Diagnosis Date  . Migraine   . Anxiety   . Wears glasses     and contacts    Family History  Problem Relation Age of Onset  . Other Other     grandparent with DJD  . Hypertension Other     grandparent  . Diabetes Other     grandparent  . Hypertension Mother   . Aneurysm Mother   . Gout Other     grandfather  . Breast cancer Other     grandmother    History   Social History  . Marital Status: Single    Spouse Name: N/A    Number of Children: 1  . Years of Education: N/A   Occupational History  . cust service rep w/ [employer]    Social History Main Topics  . Smoking status: Never Smoker   . Smokeless tobacco: Never Used  . Alcohol Use: No  . Drug Use: No  . Sexually Active: Not on file     Comment: pt uses Nova ring for birth control   Other Topics Concern  . Not on file   Social History Narrative  . No narrative on file    Allergies  Allergen Reactions  . Azithromycin Shortness Of Breath    REACTION: rash, prurutis  . Codeine Shortness Of Breath    Causes shortness of breath, rash, itching  . Hydrocodone Shortness Of Breath    Rash, itching  . Ketorolac Tromethamine Shortness Of Breath    Itching, rash  . Prednisone     SOB     Constitutional: Positive headache, fatigue and fever. Denies abrupt weight changes.  HEENT:  Positive sore throat. Denies eye redness, eye pain, pressure behind the eyes, facial pain, nasal congestion, ear pain, ringing in the ears, wax  buildup, runny nose or bloody nose. Respiratory: Positive cough. Denies difficulty breathing or shortness of breath.  Cardiovascular: Denies chest pain, chest tightness, palpitations or swelling in the hands or feet.   No other specific complaints in a complete review of systems (except as listed in HPI above).  Objective:   BP 112/74  Pulse 81  Temp(Src) 98.2 F (36.8 C) (Oral)  SpO2 98%  LMP 12/08/2012 Wt Readings from Last 3 Encounters:  11/19/12 207 lb 6.4 oz (94.076 kg)  10/31/12 200 lb (90.719 kg)  10/31/12 200 lb (90.719 kg)     General: Appears her stated age, well developed, well nourished in NAD. HEENT: Head: normal shape and size; Eyes: sclera white, no icterus, conjunctiva pink, PERRLA and EOMs intact; Ears: Tm's gray and intact, normal light reflex; Nose: mucosa pink and moist, septum midline; Throat/Mouth: + PND. Teeth present, mucosa erythematous and moist, no exudate noted, no lesions or ulcerations noted.  Neck: Mild cervical lymphadenopathy. Neck supple, trachea midline. No massses, lumps or thyromegaly present.  Cardiovascular: Normal rate and rhythm. S1,S2 noted.  No murmur, rubs or gallops noted. No JVD or  BLE edema. No carotid bruits noted. Pulmonary/Chest: Normal effort and positive vesicular breath sounds. No respiratory distress. No wheezes, rales or ronchi noted.      Assessment & Plan:   Viral infection, likely influenz:  Get some rest and drink plenty of water Do salt water gargles for the sore throat eRx for Tamiflu BID x 5 days  RTC as needed or if symptoms persist.

## 2012-12-22 ENCOUNTER — Encounter (HOSPITAL_COMMUNITY): Payer: Self-pay | Admitting: *Deleted

## 2012-12-22 DIAGNOSIS — G43909 Migraine, unspecified, not intractable, without status migrainosus: Secondary | ICD-10-CM | POA: Insufficient documentation

## 2012-12-22 DIAGNOSIS — F411 Generalized anxiety disorder: Secondary | ICD-10-CM | POA: Insufficient documentation

## 2012-12-22 DIAGNOSIS — Z79899 Other long term (current) drug therapy: Secondary | ICD-10-CM | POA: Insufficient documentation

## 2012-12-22 NOTE — ED Notes (Signed)
Pt states that she has a hx of migraines pt states that this HA ha been going on for 10 days. Pt has tried home medications without success. Pt ambulatory alert and oriented able to move all extremities and follow command.

## 2012-12-23 ENCOUNTER — Emergency Department (HOSPITAL_COMMUNITY)
Admission: EM | Admit: 2012-12-23 | Discharge: 2012-12-23 | Disposition: A | Discharge status: Home / Self Care | Payer: Medicare PPO | Attending: Emergency Medicine | Admitting: Emergency Medicine

## 2012-12-23 DIAGNOSIS — G43909 Migraine, unspecified, not intractable, without status migrainosus: Secondary | ICD-10-CM

## 2012-12-23 MED ORDER — METOCLOPRAMIDE HCL 5 MG/ML IJ SOLN
10.0000 mg | Freq: Once | INTRAMUSCULAR | Status: AC
  Administered 2012-12-23: 10 mg via INTRAVENOUS
  Filled 2012-12-23: qty 2

## 2012-12-23 MED ORDER — BUTALBITAL-APAP-CAFFEINE 50-325-40 MG PO TABS
1.0000 | ORAL_TABLET | Freq: Four times a day (QID) | ORAL | Status: DC | PRN
Start: 2012-12-23 — End: 2013-01-08

## 2012-12-23 MED ORDER — MAGNESIUM SULFATE 40 MG/ML IJ SOLN
2.0000 g | Freq: Once | INTRAMUSCULAR | Status: AC
  Administered 2012-12-23: 2 g via INTRAVENOUS
  Filled 2012-12-23: qty 50

## 2012-12-23 MED ORDER — SODIUM CHLORIDE 0.9 % IV BOLUS (SEPSIS)
1000.0000 mL | Freq: Once | INTRAVENOUS | Status: AC
  Administered 2012-12-23: 1000 mL via INTRAVENOUS

## 2012-12-23 MED ORDER — CAFFEINE-SODIUM BENZOATE 125-125 MG/ML IJ SOLN: 500.0000 mg | Freq: Once | INTRAMUSCULAR | Status: DC

## 2012-12-23 MED ORDER — DIPHENHYDRAMINE HCL 50 MG/ML IJ SOLN
25.0000 mg | Freq: Once | INTRAMUSCULAR | Status: AC
  Administered 2012-12-23: 25 mg via INTRAVENOUS
  Filled 2012-12-23: qty 1

## 2012-12-23 NOTE — ED Notes (Signed)
Pt suffers from chronic migraines. Pt is also a hard stick. Pt states that IV team usually has a hard time finding a vein

## 2012-12-23 NOTE — ED Notes (Signed)
IV team at bedside 

## 2012-12-23 NOTE — ED Notes (Signed)
MD at bedside. 

## 2012-12-23 NOTE — ED Provider Notes (Signed)
History     CSN: [id number]  Arrival date & time 12/22/12  2342   First MD Initiated Contact with Patient 12/23/12 0148      Chief Complaint  Patient presents with  . Headache    (Consider location/radiation/quality/duration/timing/severity/associated sxs/prior treatment) HPI Hx per PT, HA x one week, located L side behind her left eye, takes naproxyn at home without sig relief.  Nausea no vomiting, no weakness or numbness, light bothers her eyes. Gradual onset. Trouble sleeping due to throbbing HA - feels like a typical MHA, now severe. H/o same. No known alleviating factors.    Past Medical History  Diagnosis Date  . Migraine   . Anxiety   . Wears glasses     and contacts    Past Surgical History  Procedure Laterality Date  . Ganglion cysts  2011  . Breast biopsy  10/31/2012    Procedure: BREAST BIOPSY WITH NEEDLE LOCALIZATION;  Surgeon: Emelia Loron, MD;  Location: [facility];  Service: General;  Laterality: Right;  right breast wire guided excisional biopsy    Family History  Problem Relation Age of Onset  . Other Other     grandparent with DJD  . Hypertension Other     grandparent  . Diabetes Other     grandparent  . Hypertension Mother   . Aneurysm Mother   . Gout Other     grandfather  . Breast cancer Other     grandmother    History  Substance Use Topics  . Smoking status: Never Smoker   . Smokeless tobacco: Never Used  . Alcohol Use: No    OB History   Grav Para Term Preterm Abortions TAB SAB Ect Mult Living                  Review of Systems  Constitutional: Negative for fever and chills.  HENT: Negative for neck pain and neck stiffness.   Eyes: Negative for pain.  Respiratory: Negative for shortness of breath.   Cardiovascular: Negative for chest pain.  Gastrointestinal: Negative for abdominal pain.  Genitourinary: Negative for dysuria.  Musculoskeletal: Negative for back pain.  Skin: Negative for rash.   Neurological: Positive for headaches. Negative for seizures, syncope and speech difficulty.  All other systems reviewed and are negative.    Allergies  Azithromycin; Codeine; Hydrocodone; Ketorolac tromethamine; and Prednisone  Home Medications   Current Outpatient Rx  Name  Route  Sig  Dispense  Refill  . DOXEPIN HCL PO   Oral   Take by mouth.         . naproxen (NAPROSYN) 500 MG tablet   Oral   Take 500 mg by mouth. 1 tablet by mouth 2-3 times a week          . ondansetron (ZOFRAN-ODT) 4 MG disintegrating tablet               . etonogestrel-ethinyl estradiol (NUVARING) 0.12-0.015 MG/24HR vaginal ring   Vaginal   Place 1 each vaginally every 28 (twenty-eight) days. Insert vaginally and leave in place for 3 consecutive weeks, then remove for 1 week.            BP 143/85  Pulse 81  Temp(Src) 98.3 F (36.8 C) (Oral)  Resp 16  SpO2 97%  LMP 12/08/2012  Physical Exam  Nursing note and vitals reviewed. Constitutional: She is oriented to person, place, and time. She appears well-developed and well-nourished.  HENT:  Head: Normocephalic and atraumatic.  Eyes:  EOM are normal. Pupils are equal, round, and reactive to light.  Neck: Neck supple.  Cardiovascular: Normal heart sounds and intact distal pulses.   Pulmonary/Chest: Effort normal. No respiratory distress.  Musculoskeletal: Normal range of motion. She exhibits no edema.  Neurological: She is alert and oriented to person, place, and time.  Skin: Skin is warm and dry.    ED Course  Procedures (including critical care time)  IVFs. IV HA cocktail - magnesium, reglan and benadryl  4:10 AM recheck pain 2/10 - feels much better and wants to go home. HA precautions provided, plan outpatient follow up - PT assures me she can go home and get a 8-9 hours of uninterrupted sleep.    MDM  MHA with h/o same, no clinical neuro deficits or features to suggest need for emergent imaging  Improved with IVFs and  medications   VS and nursing notes considered        Sunnie Nielsen, MD 12/23/12 [phone]

## 2012-12-23 NOTE — ED Notes (Signed)
Unable to obtain IV access. IV team paged 

## 2012-12-25 ENCOUNTER — Ambulatory Visit (INDEPENDENT_AMBULATORY_CARE_PROVIDER_SITE_OTHER): Payer: Medicare PPO | Admitting: Internal Medicine

## 2012-12-25 ENCOUNTER — Encounter: Payer: Self-pay | Admitting: Internal Medicine

## 2012-12-25 VITALS — BP 112/72 | HR 75 | Temp 98.5°F | Ht 66.0 in | Wt 209.2 lb

## 2012-12-25 DIAGNOSIS — F329 Major depressive disorder, single episode, unspecified: Secondary | ICD-10-CM

## 2012-12-25 DIAGNOSIS — I10 Essential (primary) hypertension: Secondary | ICD-10-CM

## 2012-12-25 DIAGNOSIS — G43911 Migraine, unspecified, intractable, with status migrainosus: Secondary | ICD-10-CM

## 2012-12-25 NOTE — Assessment & Plan Note (Signed)
stable overall by history and exam, recent data reviewed with pt, and pt to continue medical treatment as before,  to f/u any worsening symptoms or concerns BP Readings from Last 3 Encounters:  12/25/12 112/72  12/23/12 123/74  12/09/12 112/74

## 2012-12-25 NOTE — Progress Notes (Signed)
Subjective:    Patient ID: Lauren Wolfe, female    DOB: [birth year], 37 y.o.   MRN: [id number]  HPI  Here to f/u, here with over 2 yr hx of daily HA, has had MRI/CT and extensive eval and tx including mult preventives and botox but pain simply has not improved;  Dr Leane Para per pt has stated no other tx he can offer.  Pt at her wits end, asks for referral to [facility]. Now on disability for chronic migraine.  Daily HA's often start in the AM, throbbing, with blurry vision, mod to occas severe, not amenable to mult tx as above, activity can make worse, this current HA now ongoing for 10 days. Denies worsening depressive symptoms, suicidal ideation, or panic; Past Medical History  Diagnosis Date  . Migraine   . Anxiety   . Wears glasses     and contacts   Past Surgical History  Procedure Laterality Date  . Ganglion cysts  2011  . Breast biopsy  10/31/2012    Procedure: BREAST BIOPSY WITH NEEDLE LOCALIZATION;  Surgeon: Emelia Loron, MD;  Location: [facility];  Service: General;  Laterality: Right;  right breast wire guided excisional biopsy    reports that she has never smoked. She has never used smokeless tobacco. She reports that she does not drink alcohol or use illicit drugs. family history includes Aneurysm in her mother; Breast cancer in her other; Diabetes in her other; Gout in her other; Hypertension in her mother and other; and Other in her other. Allergies  Allergen Reactions  . Azithromycin Shortness Of Breath    REACTION: rash, prurutis  . Codeine Shortness Of Breath    Causes shortness of breath, rash, itching  . Hydrocodone Shortness Of Breath    Rash, itching  . Ketorolac Tromethamine Shortness Of Breath    Itching, rash  . Prednisone     SOB   Current Outpatient Prescriptions on File Prior to Visit  Medication Sig Dispense Refill  . butalbital-acetaminophen-caffeine (FIORICET) 50-325-40 MG per tablet Take 1-2 tablets by mouth every 6  (six) hours as needed for headache.  20 tablet  0  . DOXEPIN HCL PO Take by mouth.      . ondansetron (ZOFRAN-ODT) 4 MG disintegrating tablet       . naproxen (NAPROSYN) 500 MG tablet Take 500 mg by mouth. 1 tablet by mouth 2-3 times a week        No current facility-administered medications on file prior to visit.   Review of Systems  Constitutional: Negative for unexpected weight change, or unusual diaphoresis  HENT: Negative for tinnitus.   Eyes: Negative for photophobia and visual disturbance.  Respiratory: Negative for choking and stridor.   Gastrointestinal: Negative for vomiting and blood in stool.  Genitourinary: Negative for hematuria and decreased urine volume.  Musculoskeletal: Negative for acute joint swelling Skin: Negative for color change and wound.  Neurological: Negative for tremors and numbness other than noted  Psychiatric/Behavioral: Negative for decreased concentration or  hyperactivity.       Objective:   Physical Exam BP 112/72  Pulse 75  Temp(Src) 98.5 F (36.9 C) (Oral)  Ht 5\' 6"  (1.676 m)  Wt 209 lb 4 oz (94.915 kg)  BMI 33.79 kg/m2  SpO2 98%  LMP 12/08/2012 VS noted,  Constitutional: Pt appears well-developed and well-nourished.  HENT: Head: NCAT.  Right Ear: External ear normal.  Left Ear: External ear normal.  Eyes: Conjunctivae and EOM are normal. Pupils are equal,  round, and reactive to light.  Neck: Normal range of motion. Neck supple.  Cardiovascular: Normal rate and regular rhythm.   Pulmonary/Chest: Effort normal and breath sounds normal.  Neurological: Pt is alert. Not confused , motor/dtr intact Skin: Skin is warm. No erythema. No rash Psychiatric: Pt behavior is normal. Thought content normal. not depressed affect    Assessment & Plan:

## 2012-12-25 NOTE — Assessment & Plan Note (Signed)
Lab Results  Component Value Date   WBC 8.2 09/19/2012   HGB 14.4 09/19/2012   HCT 42.7 09/19/2012   PLT 266.0 09/19/2012   GLUCOSE 86 09/19/2012   CHOL 238* 09/19/2012   TRIG 81.0 09/19/2012   HDL 70.00 09/19/2012   LDLDIRECT 164.5 09/19/2012   ALT 13 09/19/2012   AST 14 09/19/2012   NA 138 09/19/2012   K 4.1 09/19/2012   CL 104 09/19/2012   CREATININE 0.9 09/19/2012   BUN 14 09/19/2012   CO2 26 09/19/2012   TSH 1.52 09/19/2012  stable overall by history and exam, recent data reviewed with pt, and pt to continue medical treatment as before,  to f/u any worsening symptoms or concerns

## 2012-12-25 NOTE — Assessment & Plan Note (Signed)
Intractable, chronic, ok for referral - [facility]

## 2012-12-25 NOTE — Patient Instructions (Addendum)
Please continue all other medications as before, and refills have been done if requested. You will be contacted regarding the referral for: [facility] Thank you for enrolling in MyChart. Please follow the instructions below to securely access your online medical record. MyChart allows you to send messages to your doctor, view your test results, renew your prescriptions, schedule appointments, and more.

## 2013-01-08 ENCOUNTER — Encounter (HOSPITAL_BASED_OUTPATIENT_CLINIC_OR_DEPARTMENT_OTHER): Payer: Self-pay

## 2013-01-08 ENCOUNTER — Emergency Department (HOSPITAL_BASED_OUTPATIENT_CLINIC_OR_DEPARTMENT_OTHER)
Admission: EM | Admit: 2013-01-08 | Discharge: 2013-01-08 | Discharge status: Against Medical Advice | Payer: Medicare PPO | Attending: Emergency Medicine | Admitting: Emergency Medicine

## 2013-01-08 DIAGNOSIS — G43909 Migraine, unspecified, not intractable, without status migrainosus: Secondary | ICD-10-CM | POA: Insufficient documentation

## 2013-01-08 NOTE — ED Notes (Signed)
Pt sts medication she took pta is kicking in and migraine is gone. Pt sts she wants to leave and does not need to be seen. Family member with pt states she thinks she is fine to go. Pt alert and oriented and ambulating with a steady gait.

## 2013-01-08 NOTE — ED Notes (Signed)
Pt reports onset of "migraine" this am unrelieved by "migraine medication and zofran".

## 2013-03-04 ENCOUNTER — Encounter: Payer: Self-pay | Admitting: Internal Medicine

## 2013-03-04 ENCOUNTER — Ambulatory Visit (INDEPENDENT_AMBULATORY_CARE_PROVIDER_SITE_OTHER): Payer: Medicare PPO | Admitting: Internal Medicine

## 2013-03-04 VITALS — BP 120/82 | HR 84 | Temp 98.5°F | Ht 66.0 in | Wt 208.4 lb

## 2013-03-04 DIAGNOSIS — F3289 Other specified depressive episodes: Secondary | ICD-10-CM

## 2013-03-04 DIAGNOSIS — G43911 Migraine, unspecified, intractable, with status migrainosus: Secondary | ICD-10-CM

## 2013-03-04 DIAGNOSIS — M7541 Impingement syndrome of right shoulder: Secondary | ICD-10-CM

## 2013-03-04 DIAGNOSIS — F329 Major depressive disorder, single episode, unspecified: Secondary | ICD-10-CM

## 2013-03-04 DIAGNOSIS — I10 Essential (primary) hypertension: Secondary | ICD-10-CM

## 2013-03-04 DIAGNOSIS — M25819 Other specified joint disorders, unspecified shoulder: Secondary | ICD-10-CM

## 2013-03-04 MED ORDER — METHYLPREDNISOLONE 4 MG PO KIT: PACK | ORAL | Status: DC

## 2013-03-04 NOTE — Assessment & Plan Note (Signed)
To cont f/u with neurology 

## 2013-03-04 NOTE — Assessment & Plan Note (Signed)
Declines ortho for now, has mult nsaid and narcotic and prednisone intolerance/allergies, hold on pain med but try medrol pack, and refer to ortho if not improved in 1 wk

## 2013-03-04 NOTE — Assessment & Plan Note (Signed)
stable overall by history and exam, recent data reviewed with pt, and pt to continue medical treatment as before,  to f/u any worsening symptoms or concerns Lab Results  Component Value Date   WBC 8.2 09/19/2012   HGB 14.4 09/19/2012   HCT 42.7 09/19/2012   PLT 266.0 09/19/2012   GLUCOSE 86 09/19/2012   CHOL 238* 09/19/2012   TRIG 81.0 09/19/2012   HDL 70.00 09/19/2012   LDLDIRECT 164.5 09/19/2012   ALT 13 09/19/2012   AST 14 09/19/2012   NA 138 09/19/2012   K 4.1 09/19/2012   CL 104 09/19/2012   CREATININE 0.9 09/19/2012   BUN 14 09/19/2012   CO2 26 09/19/2012   TSH 1.52 09/19/2012

## 2013-03-04 NOTE — Patient Instructions (Signed)
Please take all new medication as prescribed - the Medrol Pack Please continue all other medications as before, and refills have been done if requested. Please have the pharmacy call with any other refills you may need. Please keep your appointments with your specialists as you have planned Please call in one wk if not improved, for orthopedic referral

## 2013-03-04 NOTE — Progress Notes (Signed)
  Subjective:    Patient ID: Lauren Wolfe, female    DOB: 04-Mar-1976, 37 y.o.   MRN: [id number]  HPI  Here with 1 mo worsening right shoulder pain, worse to abduct, sharp, mild to mod, but persitent and kept her up most of the night last night, worse to lie on the right side, no neck or radicular pain.  Has personal trainer, works out about 3-4 times per wk.  Still with persistent migraine, remains disabled, still working with neurology, recent botox second tx but neither helping so far.  No recent falls or trauma. Denies worsening depressive symptoms, suicidal ideation, or panic Past Medical History  Diagnosis Date  . Migraine   . Anxiety   . Wears glasses     and contacts   Past Surgical History  Procedure Laterality Date  . Ganglion cysts  2011  . Breast biopsy  10/31/2012    Procedure: BREAST BIOPSY WITH NEEDLE LOCALIZATION;  Surgeon: Emelia Loron, MD;  Location: [facility];  Service: General;  Laterality: Right;  right breast wire guided excisional biopsy    reports that she has never smoked. She has never used smokeless tobacco. She reports that she does not drink alcohol or use illicit drugs. family history includes Aneurysm in her mother; Breast cancer in her other; Diabetes in her other; Gout in her other; Hypertension in her mother and other; and Other in her other. Allergies  Allergen Reactions  . Azithromycin Shortness Of Breath    REACTION: rash, prurutis  . Codeine Shortness Of Breath    Causes shortness of breath, rash, itching  . Hydrocodone Shortness Of Breath    Rash, itching  . Ketorolac Tromethamine Shortness Of Breath    Itching, rash  . Prednisone     SOB  . Tramadol     itching   Current Outpatient Prescriptions on File Prior to Visit  Medication Sig Dispense Refill  . ondansetron (ZOFRAN-ODT) 4 MG disintegrating tablet        No current facility-administered medications on file prior to visit.    Review of Systems  Constitutional: Negative for unexpected weight change, or unusual diaphoresis  HENT: Negative for tinnitus.   Eyes: Negative for photophobia and visual disturbance.  Respiratory: Negative for choking and stridor.   Gastrointestinal: Negative for vomiting and blood in stool.  Genitourinary: Negative for hematuria and decreased urine volume.  Musculoskeletal: Negative for acute joint swelling Skin: Negative for color change and wound.  Neurological: Negative for tremors and numbness other than noted  Psychiatric/Behavioral: Negative for decreased concentration or  hyperactivity.       Objective:   Physical Exam BP 120/82  Pulse 84  Temp(Src) 98.5 F (36.9 C) (Oral)  Ht 5\' 6"  (1.676 m)  Wt 208 lb 6 oz (94.518 kg)  BMI 33.65 kg/m2  SpO2 97% VS noted,  Constitutional: Pt appears well-developed and well-nourished.  HENT: Head: NCAT.  Right Ear: External ear normal.  Left Ear: External ear normal.  Eyes: Conjunctivae and EOM are normal. Pupils are equal, round, and reactive to light.  Neck: Normal range of motion. Neck supple.  Cardiovascular: Normal rate and regular rhythm.   Pulmonary/Chest: Effort normal and breath sounds normal.  Right shoulder tender right subacromial, worse with active and passive abuction (which is full) Neurological: Pt is alert. Not confused  Skin: Skin is warm. No erythema.  Psychiatric: Pt behavior is normal. Thought content normal. 1+ nervous    Assessment & Plan:

## 2013-03-04 NOTE — Assessment & Plan Note (Signed)
stable overall by history and exam, recent data reviewed with pt, and pt to continue medical treatment as before,  to f/u any worsening symptoms or concerns BP Readings from Last 3 Encounters:  03/04/13 120/82  01/08/13 141/96  12/25/12 112/72

## 2013-03-13 ENCOUNTER — Telehealth: Payer: Self-pay

## 2013-03-13 DIAGNOSIS — G43909 Migraine, unspecified, not intractable, without status migrainosus: Secondary | ICD-10-CM

## 2013-03-13 NOTE — Telephone Encounter (Signed)
Patient has been seen at [facility] for her migraines but they did not help.  The patient would like a referral here in [place].  She has been seen by Dr. Vela Prose and [facility].  Please refer somewhere else.

## 2013-03-13 NOTE — Telephone Encounter (Signed)
If does not need to be seen urgently, ok for [facility], as we will have 2-3 new neurology in the next 2 months

## 2013-03-14 ENCOUNTER — Telehealth: Payer: Self-pay

## 2013-03-14 NOTE — Telephone Encounter (Signed)
Patient informed. 

## 2013-03-14 NOTE — Telephone Encounter (Signed)
Opened in error

## 2013-03-18 ENCOUNTER — Other Ambulatory Visit: Payer: Self-pay

## 2013-03-18 MED ORDER — ONDANSETRON 4 MG PO TBDP: 4.0000 mg | ORAL_TABLET | Freq: Three times a day (TID) | ORAL | Status: DC | PRN

## 2013-03-18 NOTE — Telephone Encounter (Signed)
Refill on ondansetron and tramadol (not on current med. List) to [facility]

## 2013-04-11 ENCOUNTER — Encounter: Payer: Self-pay | Admitting: Neurology

## 2013-04-11 ENCOUNTER — Telehealth: Payer: Self-pay | Admitting: *Deleted

## 2013-04-11 ENCOUNTER — Ambulatory Visit (INDEPENDENT_AMBULATORY_CARE_PROVIDER_SITE_OTHER): Payer: Medicare PPO | Admitting: Neurology

## 2013-04-11 VITALS — BP 100/68 | HR 70 | Temp 98.1°F | Ht 65.5 in | Wt 208.0 lb

## 2013-04-11 DIAGNOSIS — G43911 Migraine, unspecified, intractable, with status migrainosus: Secondary | ICD-10-CM

## 2013-04-11 NOTE — Telephone Encounter (Signed)
Pt states she has been seen by [facility], Dr Winnifred Friar, Dr Marshell Garfinkel, Dr Vela Prose, and [facility]

## 2013-04-11 NOTE — Telephone Encounter (Signed)
I dont have any other to offer at this time

## 2013-04-11 NOTE — Telephone Encounter (Signed)
Pt called requesting another referral to someone else for her headaches.  Further states the MD she saw today told her there wasn't anything they could do for er.  Please advise

## 2013-04-11 NOTE — Progress Notes (Signed)
NEUROLOGY CONSULTATION NOTE  Lauren Wolfe MRN: [id number] DOB: 21-Aug-1976   Referring physician: Dr. Jonny Ruiz Primary care physician: Dr. Jonny Ruiz  Reason for consult:  Intractable migraines, chronic daily headaches  HISTORY OF PRESENT ILLNESS: Lauren Wolfe is a 37 y.o. female with 4 year history of intractable migraines and chronic daily headaches.  She had no preceding head injury or illness.  Onset: 4 years ago Quality: throbbing Intensity: usually 7-8 out of 10, sometimes 10 out of 10 Location: bifrontal and retro-orbital Duration: daily, wakes up at the headaches Frequency: [place], severe intensity 3 days out of the week Associated symptoms: nausea, photophobia, phonophobia, osmophobia Aura: no Activity: initially had to lay down in the dark. Now he is active but unable to work. Currently on disability Aggravating factors: lights Relieving factors: occasionally tramadol and Aleve, resting in a dark  Current abortive medications: half a tramadol, Aleve, Zofran Current prophylactic medications: none  Past abortive medications: Fioricet, Midrin, Naprosyn, promethazine, sumatriptan, tramadol, lidocaine nasal spray, Relpax, Maxalt, Mobic, Amerge, Migranal, Toradol, prednisone, butalbital, naproxen, Ansaid, Cambia, DHE.  Past prophylactic medications: Lyrica, zonisamide, amitriptyline, Depakote, atenolol, Keppra, verapamil, Inderal, Remeron, Topamax, Neurontin, indomethacin, Botox.  She has also tried Biofeedback and cognitive behavioral therapy, which were not effective.  She is allergic to several medications, such as toradol, and has experienced side effects from these medications as well.  Frequency of medications: 2-3 times a week Sleep: able to sleep, but not well Caffeine: no Smoker: no  Family history of migraines positive for daughter and paternal aunt.  She has been seen by several headache specialists in the area, including [facility], [facility], and [place].  She is currently seeing the [facility] and has received 2 rounds of Botox so far (not effective).  She has a third round scheduled in August.  She was also prescribed Seroquel by them.  She wants to see someone closer and who would be more responsive when she has severe pain attacks.   MRI Brain w/wo 04/10/09 reviewed and is unremarkable.  PAST MEDICAL HISTORY: Past Medical History  Diagnosis Date  . Migraine   . Anxiety   . Wears glasses     and contacts    PAST SURGICAL HISTORY: Past Surgical History  Procedure Laterality Date  . Ganglion cysts  2011  . Breast biopsy  10/31/2012    Procedure: BREAST BIOPSY WITH NEEDLE LOCALIZATION;  Surgeon: Emelia Loron, MD;  Location: [facility];  Service: General;  Laterality: Right;  right breast wire guided excisional biopsy    MEDICATIONS: Current Outpatient Prescriptions on File Prior to Visit  Medication Sig Dispense Refill  . ondansetron (ZOFRAN-ODT) 4 MG disintegrating tablet Take 1 tablet (4 mg total) by mouth every 8 (eight) hours as needed for nausea.  20 tablet  2   No current facility-administered medications on file prior to visit.    ALLERGIES: Allergies  Allergen Reactions  . Azithromycin Shortness Of Breath    REACTION: rash, prurutis  . Codeine Shortness Of Breath    Causes shortness of breath, rash, itching  . Hydrocodone Shortness Of Breath    Rash, itching  . Ketorolac Tromethamine Shortness Of Breath    Itching, rash  . Prednisone     SOB  . Tramadol     itching    FAMILY HISTORY: Family History  Problem Relation Age of Onset  . Other Other     grandparent with DJD  . Hypertension Other  grandparent  . Diabetes Other     grandparent  . Hypertension Mother   . Aneurysm Mother   . Gout Other     grandfather  . Breast cancer Other     grandmother    SOCIAL HISTORY: History   Social History  . Marital Status: Single    Spouse Name:  N/A    Number of Children: 1  . Years of Education: N/A   Occupational History  . cust service rep w/ [employer]    Social History Main Topics  . Smoking status: Never Smoker   . Smokeless tobacco: Never Used  . Alcohol Use: No  . Drug Use: No  . Sexually Active: Not on file     Comment: pt uses Nova ring for birth control   Other Topics Concern  . Not on file   Social History Narrative  . No narrative on file    REVIEW OF SYSTEMS: Constitutional: No fevers, chills, or sweats, no generalized fatigue, change in appetite Eyes: No visual changes, double vision, eye pain Ear, nose and throat: No hearing loss, ear pain, nasal congestion, sore throat Cardiovascular: No chest pain, palpitations Respiratory:  No shortness of breath at rest or with exertion, wheezes GastrointestinaI: No nausea, vomiting, diarrhea, abdominal pain, fecal incontinence Genitourinary:  No dysuria, urinary retention or frequency Musculoskeletal:  No neck pain, back pain Integumentary: No rash, pruritus, skin lesions Neurological: as above Psychiatric: No depression, insomnia, anxiety Endocrine: No palpitations, fatigue, diaphoresis, mood swings, change in appetite, change in weight, increased thirst Hematologic/Lymphatic:  No anemia, purpura, petechiae. Allergic/Immunologic: no itchy/runny eyes, nasal congestion, recent allergic reactions, rashes  PHYSICAL EXAM: Filed Vitals:   04/11/13 0842  BP: 100/68  Pulse: 70  Temp: 98.1 F (36.7 C)   General: No acute distress Head:  Normocephalic/atraumatic Neck: supple, no paraspinal tenderness, full range of motion Back: No paraspinal tenderness Heart: regular rate and rhythm Lungs: Clear to auscultation bilaterally. Vascular: No carotid bruits. Neurological Exam: Mental status: alert and oriented to person, place, time and self, speech fluent and not dysarthric, language intact. Cranial nerves: CN I: not tested CN II: pupils equal, round and reactive to  light, visual fields intact, fundi unremarkable. CN III, IV, VI:  full range of motion, no nystagmus, no ptosis CN V: facial sensation intact CN VII: upper and lower face symmetric CN VIII: hearing intact CN IX, X: gag intact, uvula midline CN XI: sternocleidomastoid and trapezius muscles intact CN XII: tongue midline Bulk & Tone: normal, no fasciculations. Muscle strength: 5/5 throughout Sensation: temperature and vibration intact Deep Tendon Reflexes: 2+ throughout, toes downgoing Finger to nose testing: normal without dysmetria Gait: normal stance and stride, able to walk on toes, heels, and in tandem. Romberg negative.  IMPRESSION & PLAN: Lauren Wolfe is a 37 y.o. female with intractable migraines and chronic daily headaches.  She has exhausted multiple treatments by several headache specialists. Unfortunately, I don't think I would be able to add anything different, as pretty much all of my usual treatments have been tried. Based on her history, it doesn't sound like she is over using any medications. I recommend that she keeps her appointment at [facility] for her next round of Botox. She should continue seeing [facility] until she is able to find another headache specialists. She is already seen all the specialists that our clinic is aware of. I am sorry that I cannot be of further assistance.she understands, and she will keep her appointment with [facility] in the meantime.  45 minutes spent with patient, over 50% spent counseling and coordinating care.  Thank you for allowing me to take part in the care of this patient.  Shon Millet, DO  CC: Oliver Barre, MD

## 2013-04-11 NOTE — Telephone Encounter (Signed)
Can she remind me who she has already seen, I think already [facility], the local [facility], and now dr Clarisse Gouge

## 2013-06-23 DIAGNOSIS — R110 Nausea: Secondary | ICD-10-CM | POA: Insufficient documentation

## 2013-07-17 ENCOUNTER — Encounter: Payer: Self-pay | Admitting: Internal Medicine

## 2013-07-17 ENCOUNTER — Ambulatory Visit (INDEPENDENT_AMBULATORY_CARE_PROVIDER_SITE_OTHER): Payer: Medicare PPO | Admitting: Internal Medicine

## 2013-07-17 VITALS — BP 110/80 | HR 79 | Temp 99.1°F | Ht 66.0 in | Wt 210.0 lb

## 2013-07-17 DIAGNOSIS — F411 Generalized anxiety disorder: Secondary | ICD-10-CM

## 2013-07-17 DIAGNOSIS — R51 Headache: Secondary | ICD-10-CM

## 2013-07-17 DIAGNOSIS — I10 Essential (primary) hypertension: Secondary | ICD-10-CM

## 2013-07-17 MED ORDER — GABAPENTIN 300 MG PO CAPS: 300.0000 mg | ORAL_CAPSULE | Freq: Three times a day (TID) | ORAL | Status: DC

## 2013-07-17 NOTE — Assessment & Plan Note (Signed)
Declines further tx at this time, stable overall by history and exam,  and pt to continue medical treatment as before,  to f/u any worsening symptoms or concerns

## 2013-07-17 NOTE — Assessment & Plan Note (Signed)
stable overall by history and exam, recent data reviewed with pt, and pt to continue medical treatment as before,  to f/u any worsening symptoms or concerns BP Readings from Last 3 Encounters:  07/17/13 110/80  04/11/13 100/68  03/04/13 120/82

## 2013-07-17 NOTE — Assessment & Plan Note (Signed)
Changed HA per pt, exam benign, long hx of intractable migraine now with daily HA, etiology unclear, for repeat MRI, trial gabapentin - ? Occipital neuralgia

## 2013-07-17 NOTE — Patient Instructions (Signed)
Please take all new medication as prescribed - the gabapentin 300 mg three times per day (sent to [facility]) - BUT start at one pill at night for 2 nights, then 1 pill twice per day for 2 days, then go to three times per day (to avoid sleepiness with taking the whole dosing at the beginning)  You will be contacted regarding the referral for: MRI head  Please continue all other medications as before  Please have the pharmacy call with any other refills you may need.  Please keep your appointments with your specialists as you have planned

## 2013-07-17 NOTE — Progress Notes (Signed)
Subjective:    Patient ID: Lauren Wolfe, female    DOB: 04-Nov-1975, 37 y.o.   MRN: [id number]  HPI Here to f/u, has ongoing daily migraine intractable s/p mult treatment trials up to 300 different medicaitions, including most recently 3 day constant DHE IV trial at [facility], now exhausted all options per pt.  But now typical migraine pain has changed, now occipital bilat, new for her, MRI 2010 neg for acute.  Denies worsening depressive symptoms, suicidal ideation, or panic; has ongoing anxiety.   Past Medical History  Diagnosis Date  . Migraine   . Anxiety   . Wears glasses     and contacts   Past Surgical History  Procedure Laterality Date  . Ganglion cysts  2011  . Breast biopsy  10/31/2012    Procedure: BREAST BIOPSY WITH NEEDLE LOCALIZATION;  Surgeon: Emelia Loron, MD;  Location: [facility];  Service: General;  Laterality: Right;  right breast wire guided excisional biopsy    reports that she has never smoked. She has never used smokeless tobacco. She reports that she does not drink alcohol or use illicit drugs. family history includes Aneurysm in her mother; Breast cancer in her other; Diabetes in her other; Gout in her other; Hypertension in her mother and other; Other in her other. Allergies  Allergen Reactions  . Azithromycin Shortness Of Breath    REACTION: rash, prurutis  . Codeine Shortness Of Breath    Causes shortness of breath, rash, itching  . Hydrocodone Shortness Of Breath    Rash, itching  . Ketorolac Tromethamine Shortness Of Breath    Itching, rash  . Prednisone     SOB  . Tramadol     itching   Current Outpatient Prescriptions on File Prior to Visit  Medication Sig Dispense Refill  . medroxyPROGESTERone (DEPO-PROVERA) 150 MG/ML injection       . ondansetron (ZOFRAN-ODT) 4 MG disintegrating tablet Take 1 tablet (4 mg total) by mouth every 8 (eight) hours as needed for nausea.  20 tablet  2   No current facility-administered medications  on file prior to visit.   Review of Systems  Constitutional: Negative for unexpected weight change, or unusual diaphoresis  HENT: Negative for tinnitus.   Eyes: Negative for photophobia and visual disturbance.  Respiratory: Negative for choking and stridor.   Gastrointestinal: Negative for vomiting and blood in stool.  Genitourinary: Negative for hematuria and decreased urine volume.  Musculoskeletal: Negative for acute joint swelling Skin: Negative for color change and wound.  Neurological: Negative for tremors and numbness other than noted  Psychiatric/Behavioral: Negative for decreased concentration or  hyperactivity.       Objective:   Physical Exam BP 110/80  Pulse 79  Temp(Src) 99.1 F (37.3 C) (Oral)  Ht 5\' 6"  (1.676 m)  Wt 210 lb (95.255 kg)  BMI 33.91 kg/m2  SpO2 95% VS noted,  Constitutional: Pt appears well-developed and well-nourished.  HENT: Head: NCAT.  Right Ear: External ear normal.  Left Ear: External ear normal.  Eyes: Conjunctivae and EOM are normal. Pupils are equal, round, and reactive to light.  Neck: Normal range of motion. Neck supple. with nontender upper cervical and post head/occipital Cardiovascular: Normal rate and regular rhythm.   Pulmonary/Chest: Effort normal and breath sounds normal.  Abd:  Soft, NT, non-distended, + BS Neurological: Pt is alert. Not confused , motor 5/5, sens/dtr intact Skin: Skin is warm. No erythema.  Psychiatric: Pt behavior is normal. Thought content normal.  Assessment & Plan:

## 2013-07-30 ENCOUNTER — Ambulatory Visit
Admission: RE | Admit: 2013-07-30 | Discharge: 2013-07-30 | Disposition: A | Discharge status: Home / Self Care | Payer: Medicare PPO | Source: Ambulatory Visit | Attending: Internal Medicine | Admitting: Internal Medicine

## 2013-08-07 ENCOUNTER — Other Ambulatory Visit: Payer: Self-pay

## 2013-10-14 ENCOUNTER — Other Ambulatory Visit: Payer: Self-pay

## 2013-10-14 DIAGNOSIS — Z1231 Encounter for screening mammogram for malignant neoplasm of breast: Secondary | ICD-10-CM

## 2013-10-14 DIAGNOSIS — Z9889 Other specified postprocedural states: Secondary | ICD-10-CM

## 2013-11-06 ENCOUNTER — Ambulatory Visit
Admission: RE | Admit: 2013-11-06 | Discharge: 2013-11-06 | Disposition: A | Discharge status: Home / Self Care | Payer: Self-pay | Source: Ambulatory Visit

## 2013-11-06 DIAGNOSIS — Z1231 Encounter for screening mammogram for malignant neoplasm of breast: Secondary | ICD-10-CM

## 2013-11-06 DIAGNOSIS — Z9889 Other specified postprocedural states: Secondary | ICD-10-CM

## 2013-11-11 ENCOUNTER — Other Ambulatory Visit: Payer: Self-pay | Admitting: Obstetrics and Gynecology

## 2013-11-11 DIAGNOSIS — R928 Other abnormal and inconclusive findings on diagnostic imaging of breast: Secondary | ICD-10-CM

## 2013-11-21 ENCOUNTER — Ambulatory Visit
Admission: RE | Admit: 2013-11-21 | Discharge: 2013-11-21 | Disposition: A | Discharge status: Home / Self Care | Payer: Medicare PPO | Source: Ambulatory Visit | Attending: Obstetrics and Gynecology | Admitting: Obstetrics and Gynecology

## 2013-11-21 DIAGNOSIS — R928 Other abnormal and inconclusive findings on diagnostic imaging of breast: Secondary | ICD-10-CM

## 2014-09-15 ENCOUNTER — Other Ambulatory Visit (INDEPENDENT_AMBULATORY_CARE_PROVIDER_SITE_OTHER): Payer: Medicare PPO

## 2014-09-15 ENCOUNTER — Ambulatory Visit (INDEPENDENT_AMBULATORY_CARE_PROVIDER_SITE_OTHER): Payer: Medicare PPO | Admitting: Internal Medicine

## 2014-09-15 ENCOUNTER — Encounter: Payer: Self-pay | Admitting: Internal Medicine

## 2014-09-15 VITALS — BP 120/78 | HR 74 | Temp 98.5°F | Ht 66.0 in | Wt 198.0 lb

## 2014-09-15 DIAGNOSIS — Z0000 Encounter for general adult medical examination without abnormal findings: Secondary | ICD-10-CM

## 2014-09-15 DIAGNOSIS — I10 Essential (primary) hypertension: Secondary | ICD-10-CM

## 2014-09-15 LAB — CBC WITH DIFFERENTIAL/PLATELET
BASOS PCT: 0.4 % (ref 0.0–3.0)
Basophils Absolute: 0 10*3/uL (ref 0.0–0.1)
Eosinophils Absolute: 0.1 10*3/uL (ref 0.0–0.7)
Eosinophils Relative: 1 % (ref 0.0–5.0)
HEMATOCRIT: 40.7 % (ref 36.0–46.0)
HEMOGLOBIN: 13.4 g/dL (ref 12.0–15.0)
LYMPHS ABS: 2.6 10*3/uL (ref 0.7–4.0)
LYMPHS PCT: 34 % (ref 12.0–46.0)
MCHC: 32.9 g/dL (ref 30.0–36.0)
MCV: 89.9 fl (ref 78.0–100.0)
MONOS PCT: 8 % (ref 3.0–12.0)
Monocytes Absolute: 0.6 10*3/uL (ref 0.1–1.0)
NEUTROS ABS: 4.4 10*3/uL (ref 1.4–7.7)
Neutrophils Relative %: 56.6 % (ref 43.0–77.0)
Platelets: 274 10*3/uL (ref 150.0–400.0)
RBC: 4.53 Mil/uL (ref 3.87–5.11)
RDW: 13.8 % (ref 11.5–15.5)
WBC: 7.7 10*3/uL (ref 4.0–10.5)

## 2014-09-15 LAB — URINALYSIS, ROUTINE W REFLEX MICROSCOPIC
BILIRUBIN URINE: NEGATIVE
HGB URINE DIPSTICK: NEGATIVE
Ketones, ur: NEGATIVE
Leukocytes, UA: NEGATIVE
Nitrite: NEGATIVE
Specific Gravity, Urine: 1.02 (ref 1.000–1.030)
TOTAL PROTEIN, URINE-UPE24: NEGATIVE
URINE GLUCOSE: NEGATIVE
Urobilinogen, UA: 0.2 (ref 0.0–1.0)
pH: 7 (ref 5.0–8.0)

## 2014-09-15 LAB — BASIC METABOLIC PANEL
BUN: 15 mg/dL (ref 6–23)
CALCIUM: 9.1 mg/dL (ref 8.4–10.5)
CO2: 24 meq/L (ref 19–32)
CREATININE: 1 mg/dL (ref 0.4–1.2)
Chloride: 109 mEq/L (ref 96–112)
GFR: 79.68 mL/min (ref >60.00)
Glucose, Bld: 81 mg/dL (ref 70–99)
Potassium: 3.9 mEq/L (ref 3.5–5.1)
SODIUM: 138 meq/L (ref 135–145)

## 2014-09-15 LAB — HEPATIC FUNCTION PANEL
ALK PHOS: 82 U/L (ref 39–117)
ALT: 14 U/L (ref 0–35)
AST: 12 U/L (ref 0–37)
Albumin: 4.1 g/dL (ref 3.5–5.2)
Bilirubin, Direct: 0.1 mg/dL (ref 0.0–0.3)
TOTAL PROTEIN: 6.9 g/dL (ref 6.0–8.3)
Total Bilirubin: 0.4 mg/dL (ref 0.2–1.2)

## 2014-09-15 LAB — LIPID PANEL
Cholesterol: 227 mg/dL — ABNORMAL HIGH (ref 0–200)
HDL: 64.3 mg/dL (ref >39.00)
LDL CALC: 144 mg/dL — AB (ref 0–99)
NonHDL: 162.7
Total CHOL/HDL Ratio: 4
Triglycerides: 92 mg/dL (ref 0.0–149.0)
VLDL: 18.4 mg/dL (ref 0.0–40.0)

## 2014-09-15 LAB — TSH: TSH: 0.67 u[IU]/mL (ref 0.35–4.50)

## 2014-09-15 NOTE — Progress Notes (Signed)
Subjective:    Patient ID: Lauren Wolfe, female    DOB: 12-Aug-1976, 38 y.o.   MRN: [id number]  HPI   Here for wellness and f/u;  Overall doing ok;  Pt denies CP, worsening SOB, DOE, wheezing, orthopnea, PND, worsening LE edema, palpitations, dizziness or syncope.  Pt denies neurological change such as new headache, facial or extremity weakness.  Pt denies polydipsia, polyuria, or low sugar symptoms. Pt states overall good compliance with treatment and medications, good tolerability, and has been trying to follow lower cholesterol diet.  Pt denies worsening depressive symptoms, suicidal ideation or panic. No fever, night sweats, wt loss, loss of appetite, or other constitutional symptoms.  Pt states good ability with ADL's, has low fall risk, home safety reviewed and adequate, no other significant changes in hearing or vision, and only occasionally active with exercise. HA's overall improved recently, now on less medication, and back to work for first time in 3 yrs.  No other acute complaints Past Medical History  Diagnosis Date  . Migraine   . Anxiety   . Wears glasses     and contacts   Past Surgical History  Procedure Laterality Date  . Ganglion cysts  2011  . Breast biopsy  10/31/2012    Procedure: BREAST BIOPSY WITH NEEDLE LOCALIZATION;  Surgeon: Rolm Bookbinder, MD;  Location: [facility];  Service: General;  Laterality: Right;  right breast wire guided excisional biopsy    reports that she has never smoked. She has never used smokeless tobacco. She reports that she does not drink alcohol or use illicit drugs. family history includes Aneurysm in her mother; Breast cancer in her other; Diabetes in her other; Gout in her other; Hypertension in her mother and other; Other in her other. Allergies  Allergen Reactions  . Azithromycin Shortness Of Breath    REACTION: rash, prurutis  . Codeine Shortness Of Breath    Causes shortness of breath, rash, itching  . Hydrocodone  Shortness Of Breath    Rash, itching  . Ketorolac Tromethamine Shortness Of Breath    Itching, rash  . Prednisone     SOB  . Tramadol     itching   No current outpatient prescriptions on file prior to visit.   No current facility-administered medications on file prior to visit.   Review of Systems Constitutional: Negative for increased diaphoresis, other activity, appetite or other siginficant weight change  HENT: Negative for worsening hearing loss, ear pain, facial swelling, mouth sores and neck stiffness.   Eyes: Negative for other worsening pain, redness or visual disturbance.  Respiratory: Negative for shortness of breath and wheezing.   Cardiovascular: Negative for chest pain and palpitations.  Gastrointestinal: Negative for diarrhea, blood in stool, abdominal distention or other pain Genitourinary: Negative for hematuria, flank pain or change in urine volume.  Musculoskeletal: Negative for myalgias or other joint complaints.  Skin: Negative for color change and wound.  Neurological: Negative for syncope and numbness. other than noted Hematological: Negative for adenopathy. or other swelling Psychiatric/Behavioral: Negative for hallucinations, self-injury, decreased concentration or other worsening agitation.      Objective:   Physical Exam BP 120/78 mmHg  Pulse 74  Temp(Src) 98.5 F (36.9 C) (Oral)  Ht 5' 6"  (1.676 m)  Wt 198 lb (89.812 kg)  BMI 31.97 kg/m2  SpO2 96% VS noted,  Constitutional: Pt is oriented to person, place, and time. Appears well-developed and well-nourished.  Head: Normocephalic and atraumatic.  Right Ear: External ear normal.  Left Ear: External ear normal.  Nose: Nose normal.  Mouth/Throat: Oropharynx is clear and moist.  Eyes: Conjunctivae and EOM are normal. Pupils are equal, round, and reactive to light.  Neck: Normal range of motion. Neck supple. No JVD present. No tracheal deviation present.  Cardiovascular: Normal rate, regular rhythm,  normal heart sounds and intact distal pulses.   Pulmonary/Chest: Effort normal and breath sounds without rales or wheezing  Abdominal: Soft. Bowel sounds are normal. NT. No HSM  Musculoskeletal: Normal range of motion. Exhibits no edema.  Lymphadenopathy:  Has no cervical adenopathy.  Neurological: Pt is alert and oriented to person, place, and time. Pt has normal reflexes. No cranial nerve deficit. Motor grossly intact Skin: Skin is warm and dry. No rash noted.  Psychiatric:  Has mild nervous mood and affect. Behavior is normal. not depressed affect    Assessment & Plan:

## 2014-09-15 NOTE — Progress Notes (Signed)
Pre visit review using our clinic review tool, if applicable. No additional management support is needed unless otherwise documented below in the visit note. 

## 2014-09-15 NOTE — Patient Instructions (Signed)
Please continue all other medications as before, and refills have been done if requested.  Please have the pharmacy call with any other refills you may need.  Please continue your efforts at being more active, low cholesterol diet, and weight control.  You are otherwise up to date with prevention measures today.  Please keep your appointments with your specialists as you may have planned  Please return in 1 year for your yearly visit, or sooner if needed, with Lab testing done 3-5 days before  

## 2014-09-17 NOTE — Assessment & Plan Note (Signed)
stable overall by history and exam, recent data reviewed with pt, and pt to continue medical treatment as before,  to f/u any worsening symptoms or concerns BP Readings from Last 3 Encounters:  09/15/14 120/78  07/17/13 110/80  04/11/13 100/68

## 2014-09-17 NOTE — Assessment & Plan Note (Signed)

## 2017-02-14 ENCOUNTER — Emergency Department (HOSPITAL_BASED_OUTPATIENT_CLINIC_OR_DEPARTMENT_OTHER)
Admission: EM | Admit: 2017-02-14 | Discharge: 2017-02-14 | Disposition: A | Discharge status: Home / Self Care | Payer: No Typology Code available for payment source | Attending: Physician Assistant | Admitting: Physician Assistant

## 2017-02-14 ENCOUNTER — Encounter (HOSPITAL_BASED_OUTPATIENT_CLINIC_OR_DEPARTMENT_OTHER): Payer: Self-pay | Admitting: *Deleted

## 2017-02-14 DIAGNOSIS — Y92410 Unspecified street and highway as the place of occurrence of the external cause: Secondary | ICD-10-CM | POA: Insufficient documentation

## 2017-02-14 DIAGNOSIS — Y999 Unspecified external cause status: Secondary | ICD-10-CM | POA: Diagnosis not present

## 2017-02-14 DIAGNOSIS — G43909 Migraine, unspecified, not intractable, without status migrainosus: Secondary | ICD-10-CM | POA: Insufficient documentation

## 2017-02-14 DIAGNOSIS — Y939 Activity, unspecified: Secondary | ICD-10-CM | POA: Diagnosis not present

## 2017-02-14 DIAGNOSIS — G43009 Migraine without aura, not intractable, without status migrainosus: Secondary | ICD-10-CM

## 2017-02-14 DIAGNOSIS — I10 Essential (primary) hypertension: Secondary | ICD-10-CM | POA: Diagnosis not present

## 2017-02-14 MED ORDER — PROCHLORPERAZINE EDISYLATE 5 MG/ML IJ SOLN
10.0000 mg | Freq: Once | INTRAMUSCULAR | Status: AC
  Administered 2017-02-14: 10 mg via INTRAVENOUS
  Filled 2017-02-14: qty 2

## 2017-02-14 MED ORDER — SODIUM CHLORIDE 0.9 % IV BOLUS (SEPSIS)
1000.0000 mL | Freq: Once | INTRAVENOUS | Status: AC
  Administered 2017-02-14: 1000 mL via INTRAVENOUS

## 2017-02-14 MED ORDER — DIPHENHYDRAMINE HCL 50 MG/ML IJ SOLN
25.0000 mg | Freq: Once | INTRAMUSCULAR | Status: AC
  Administered 2017-02-14: 25 mg via INTRAVENOUS
  Filled 2017-02-14: qty 1
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
## 2017-02-20 ENCOUNTER — Encounter (HOSPITAL_BASED_OUTPATIENT_CLINIC_OR_DEPARTMENT_OTHER): Payer: Self-pay | Admitting: Emergency Medicine

## 2017-02-20 ENCOUNTER — Emergency Department (HOSPITAL_BASED_OUTPATIENT_CLINIC_OR_DEPARTMENT_OTHER)
Admission: EM | Admit: 2017-02-20 | Discharge: 2017-02-20 | Disposition: A | Discharge status: Home / Self Care | Payer: BLUE CROSS/BLUE SHIELD | Attending: Emergency Medicine | Admitting: Emergency Medicine

## 2017-02-20 ENCOUNTER — Telehealth: Payer: Self-pay | Admitting: Internal Medicine

## 2017-02-20 ENCOUNTER — Emergency Department (HOSPITAL_BASED_OUTPATIENT_CLINIC_OR_DEPARTMENT_OTHER): Payer: BLUE CROSS/BLUE SHIELD

## 2017-02-20 DIAGNOSIS — I10 Essential (primary) hypertension: Secondary | ICD-10-CM | POA: Insufficient documentation

## 2017-02-20 DIAGNOSIS — I82621 Acute embolism and thrombosis of deep veins of right upper extremity: Secondary | ICD-10-CM | POA: Insufficient documentation

## 2017-02-20 DIAGNOSIS — M79601 Pain in right arm: Secondary | ICD-10-CM | POA: Diagnosis present

## 2017-02-20 DIAGNOSIS — R52 Pain, unspecified: Secondary | ICD-10-CM

## 2017-02-20 LAB — BASIC METABOLIC PANEL
Anion gap: 8 (ref 5–15)
BUN: 10 mg/dL (ref 6–20)
CALCIUM: 9.2 mg/dL (ref 8.9–10.3)
CO2: 26 mmol/L (ref 22–32)
Chloride: 104 mmol/L (ref 101–111)
Creatinine, Ser: 0.8 mg/dL (ref 0.44–1.00)
GFR calc Af Amer: >60 mL/min (ref >60)
GFR calc non Af Amer: >60 mL/min (ref >60)
Glucose, Bld: 100 mg/dL — ABNORMAL HIGH (ref 65–99)
Potassium: 4 mmol/L (ref 3.5–5.1)
Sodium: 138 mmol/L (ref 135–145)

## 2017-02-20 LAB — PREGNANCY, URINE: Preg Test, Ur: NEGATIVE

## 2017-02-20 MED ORDER — RIVAROXABAN 15 MG PO TABS
15.0000 mg | ORAL_TABLET | Freq: Once | ORAL | Status: AC
  Administered 2017-02-20: 15 mg via ORAL
  Filled 2017-02-20: qty 1

## 2017-02-20 MED ORDER — RIVAROXABAN (XARELTO) VTE STARTER PACK (15 & 20 MG): ORAL_TABLET | ORAL | 0 refills | Status: DC

## 2017-02-20 MED FILL — XARELTO STARTER PACK: 15 & 20 | 28 days supply | Qty: 51 | Fill #0

## 2017-02-20 NOTE — ED Provider Notes (Signed)
10:58 AM Pt seen in conjunction with Maczis PA-C with right forearm swelling and tenderness since receiving IV in the emergency department 4 days ago. Pain worsened yesterday and patient developed some shooting pain and numbness in her forearm and upper arm. Patient spoke with a medicine physician who recommended that she come to the emergency department for Doppler ultrasound. Patient has been taking NSAIDs without improvement. No chest pain or shortness of breath.  Palpable cord noted to forearm at site of previous IV with associated tenderness and ecchymosis. Based on exam, suspicious for superficial thrombophlebitis. Ultrasound ordered to rule out DVT.  BP 140/63 (BP Location: Left Arm)   Pulse 84   Temp 99.1 F (37.3 C) (Oral)   Resp 16   Ht 5' 7"  (1.702 m)   Wt 97.5 kg (215 lb)   LMP 02/18/2017   SpO2 98%   BMI 33.67 kg/m     Carlisle Cater, PA-C 02/20/17 1100    Duffy Bruce, MD 02/21/17 [phone]

## 2017-02-20 NOTE — ED Triage Notes (Signed)
Pt c/o R arm pain since last Wednesday after she had an IV with medication. Pt spoke with her doctor who is concerned about possible phlebitis.

## 2017-02-20 NOTE — Discharge Instructions (Signed)
You have a blood clot in your right arm that was found on ultrasound. I'm prescribing in anticoagulant to be taken as written. I have attached a handouts on DVT. Follow up with her PCP to discuss long-term treatment, and for follow-up. He can return to the emergency department at any time if symptoms worsen or new worrisome symptoms develop.

## 2017-02-20 NOTE — Telephone Encounter (Signed)
I asked Dr. Jenny Reichmann for a little clarification and said that she can be seen sooner or on 6/1. Which ever she prefers.

## 2017-02-20 NOTE — Telephone Encounter (Signed)
Ok to be seen since she is an active pt for 3 yrs after last visit

## 2017-02-20 NOTE — ED Provider Notes (Signed)
[facility] Provider Note   CSN: [id number] Arrival date & time: 02/20/17  1829     History   Chief Complaint Chief Complaint  Patient presents with  . Arm Pain    HPI Lauren Wolfe is a 41 y.o. female who presents to [facility] emergency department for right arm pain since last Wednesday after receiving IV medication. The patient states she has had persistent, achy, dull, 7/10 arm pain that extends from her medial bicep to forearm on the right side. It is associated with numbness and tingling on occasion. The pain is worse with movement and she notes weakness due to pain. There is half dollar size ecchymosis at the site where the IV was placed. She has tried 634m ibuprofen, muscle relaxer and ice without any relief of the pain. She had a video conference call with her PCP who was worried for a blood clot and referred her here for an ultrasound. The patient is on birth control. She denies any recent surgery, travel, immobilization, history of blood clots, history of clotting disorders. Denies IV drug use, or tobacco use. She denies fever, chills, sob, doe, pleuritic like chest pain.   HPI  Past Medical History:  Diagnosis Date  . Anxiety   . Migraine   . Wears glasses    and contacts    Patient Active Problem List   Diagnosis Date Noted  . Occipital headache 07/17/2013  . Impingement syndrome of right shoulder 03/04/2013  . Preventative health care 09/24/2012  . Essential hypertension 06/16/2009  . ANXIETY 05/23/2009  . DEPRESSION 05/23/2009  . INSOMNIA-SLEEP DISORDER-UNSPEC 05/19/2009  . MIGRAINE UNSP W/INTRACTABLE W/STATUS MIGRAINOSUS 04/07/2009  . ALOPECIA 11/30/2008    Past Surgical History:  Procedure Laterality Date  . BREAST BIOPSY  10/31/2012   Procedure: BREAST BIOPSY WITH NEEDLE LOCALIZATION;  Surgeon: MRolm Bookbinder MD;  Location: [facility]  Service: General;  Laterality: Right;  right breast wire guided excisional biopsy  .  ganglion cysts  2011    OB History    No data available       Home Medications    Prior to Admission medications   Medication Sig Start Date End Date Taking? Authorizing Provider  Rivaroxaban 15 & 20 MG TBPK Take as directed on package: Start with one 154mtablet by mouth twice a day with food. On Day 22, switch to one 2075mablet once a day with food. 02/20/17   Maczis, MicBarth KirksA-C    Family History Family History  Problem Relation Age of Onset  . Hypertension Mother   . Aneurysm Mother   . Other Other        grandparent with DJD  . Hypertension Other        grandparent  . Diabetes Other        grandparent  . Gout Other        grandfather  . Breast cancer Other        grandmother    Social History Social History  Substance Use Topics  . Smoking status: Never Smoker  . Smokeless tobacco: Never Used  . Alcohol use No     Allergies   Azithromycin; Codeine; Hydrocodone; Ketorolac tromethamine; Prednisone; and Tramadol   Review of Systems Review of Systems  All other systems reviewed and are negative.    Physical Exam Updated Vital Signs BP 137/82 (BP Location: Left Arm)   Pulse 68   Temp 99.1 F (37.3 C) (Oral)   Resp 18  Ht 5' 7"  (1.702 m)   Wt 97.5 kg (215 lb)   LMP 02/18/2017   SpO2 99%   BMI 33.67 kg/m   Physical Exam  Constitutional: She appears well-developed and well-nourished.  HENT:  Head: Normocephalic and atraumatic.  Eyes: Conjunctivae are normal. Right eye exhibits no discharge. Left eye exhibits no discharge. No scleral icterus.  Cardiovascular: Normal rate, regular rhythm, normal heart sounds, intact distal pulses and normal pulses.   Pulses:      Radial pulses are 2+ on the right side, and 2+ on the left side.       Dorsalis pedis pulses are 2+ on the right side, and 2+ on the left side.       Posterior tibial pulses are 2+ on the right side, and 2+ on the left side.  Pulmonary/Chest: Effort normal and breath sounds normal.  No respiratory distress.  Musculoskeletal:       Right shoulder: Normal.       Left shoulder: Normal.       Right elbow: Normal.      Left elbow: Normal.       Right wrist: Normal.       Left wrist: Normal.  Neurological: She is alert. She has normal strength. No sensory deficit.  Skin: Skin is warm, dry and intact. Capillary refill takes less than 2 seconds. Bruising and ecchymosis noted. No rash noted. There is erythema. No pallor.     Palpable cord noted  Psychiatric: She has a normal mood and affect.  Nursing note and vitals reviewed.    ED Treatments / Results  Labs (all labs ordered are listed, but only abnormal results are displayed) Labs Reviewed  BASIC METABOLIC PANEL - Abnormal; Notable for the following:       Result Value   Glucose, Bld 100 (*)    All other components within normal limits  PREGNANCY, URINE    EKG  EKG Interpretation None       Radiology US Venous Img Upper Uni Right  Result Date: 02/20/2017 CLINICAL DATA:  Right medial elbow pain, upper arm pain, redness, swelling EXAM: RIGHT UPPER EXTREMITY VENOUS DOPPLER ULTRASOUND TECHNIQUE: Gray-scale sonography with graded compression, as well as color Doppler and duplex ultrasound were performed to evaluate the upper extremity deep venous system from the level of the subclavian vein and including the jugular, axillary, basilic, radial, ulnar and upper cephalic vein. Spectral Doppler was utilized to evaluate flow at rest and with distal augmentation maneuvers. COMPARISON:  None. FINDINGS: Contralateral Subclavian Vein: Respiratory phasicity is normal and symmetric with the symptomatic side. No evidence of thrombus. Normal compressibility. Internal Jugular Vein: No evidence of thrombus. Normal compressibility, respiratory phasicity and response to augmentation. Subclavian Vein: No evidence of thrombus. Normal compressibility, respiratory phasicity and response to augmentation. Axillary Vein: No evidence of  thrombus. Normal compressibility, respiratory phasicity and response to augmentation. Cephalic Vein: No evidence of thrombus. Normal compressibility, respiratory phasicity and response to augmentation. Basilic Vein: The right basilic vein is noncompressible and without flow from the upper forearm into the mid upper arm compatible with thrombosis. Brachial Veins: No evidence of thrombus. Normal compressibility, respiratory phasicity and response to augmentation. Radial Veins: No evidence of thrombus. Normal compressibility, respiratory phasicity and response to augmentation. Ulnar Veins: No evidence of thrombus. Normal compressibility, respiratory phasicity and response to augmentation. Venous Reflux:  None visualized. Other Findings:  None visualized. IMPRESSION: Deep venous thrombosis within the right basilic vein. Electronically Signed   By: Rolm Baptise  M.D.   On: 02/20/2017 11:24    Procedures Procedures (including critical care time)  Medications Ordered in ED Medications  Rivaroxaban (XARELTO) tablet 15 mg (not administered)     Initial Impression / Assessment and Plan / ED Course  I have reviewed the triage vital signs and the nursing notes.  Pertinent labs & imaging results that were available during my care of the patient were reviewed by me and considered in my medical decision making (see chart for details).     Obtained US of right upper extremity to r/o blood clot. Results show Deep venous thrombosis within the right basilic vein. Obtained blood work to check CrCl. Creatinine clearance within normal limits. Urine pregnancy negative. Will start on Xerelto. First dose given prior to discharge. Advise to stop birth control. To follow up with PCP for long-term management and follow-up. Advised to return to the emergency department if symptoms worsen or any time. Return precautions given.  Final Clinical Impressions(s) / ED Diagnoses   Final diagnoses:  Pain  Arm DVT (deep venous  thromboembolism), acute, right (HCC)    New Prescriptions New Prescriptions   RIVAROXABAN 15 & 20 MG TBPK    Take as directed on package: Start with one 2m tablet by mouth twice a day with food. On Day 22, switch to one 27mtablet once a day with food.     MaLorelle Gibbs5/22/18 1330    IsDuffy BruceMD 02/21/17 [phone]
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
## 2017-02-20 NOTE — ED Notes (Signed)
Pt verbalized understanding of discharge instructions and denies any further questions at this time.   

## 2017-02-20 NOTE — ED Notes (Signed)
Education packet to be given at the pharmacy.

## 2017-02-22 ENCOUNTER — Ambulatory Visit (INDEPENDENT_AMBULATORY_CARE_PROVIDER_SITE_OTHER): Payer: BLUE CROSS/BLUE SHIELD | Admitting: Internal Medicine

## 2017-02-22 DIAGNOSIS — F411 Generalized anxiety disorder: Secondary | ICD-10-CM

## 2017-02-22 DIAGNOSIS — I10 Essential (primary) hypertension: Secondary | ICD-10-CM | POA: Diagnosis not present

## 2017-02-22 DIAGNOSIS — I82621 Acute embolism and thrombosis of deep veins of right upper extremity: Secondary | ICD-10-CM

## 2017-02-22 MED ORDER — APIXABAN 5 MG PO TABS: 5.0000 mg | ORAL_TABLET | Freq: Two times a day (BID) | ORAL | 5 refills | Status: DC

## 2017-02-22 MED ORDER — OXYCODONE-ACETAMINOPHEN 5-325 MG PO TABS: 1.0000 | ORAL_TABLET | Freq: Three times a day (TID) | ORAL | 0 refills | Status: DC | PRN

## 2017-02-22 NOTE — Assessment & Plan Note (Signed)
Chronic persistent now situationally worse, declines change in tx or referral for counseling, tried to reassure today

## 2017-02-22 NOTE — Progress Notes (Signed)
Subjective:    Patient ID: Lauren Wolfe, female    DOB: 1976-03-27, 41 y.o.   MRN: [id number]  HPI  Here to f/u after recent visit to ED where IV was placed near right antecubital, but unfortunately developed pain and swelling, and went back to ED after advised to do so by Teledoc visit.  Found to have acute RUE DVT, started on xarelto.  Has numerous med intolerances and states today her xarelto is causing itching without rash that she did not have before.  No swelling, and Pt denies chest pain, increased sob or doe, wheezing, orthopnea, PND, increased LE swelling, palpitations, dizziness or syncope.  Her BCP has been stopped.  Asking for pain control, though is not approp for nsaid on xarelto, and has intolerance to everything narcotic except has not tried percocet.  Tylenol not controlling pain.  No fever. Past Medical History:  Diagnosis Date  . Anxiety   . Migraine   . Wears glasses    and contacts   Past Surgical History:  Procedure Laterality Date  . BREAST BIOPSY  10/31/2012   Procedure: BREAST BIOPSY WITH NEEDLE LOCALIZATION;  Surgeon: Rolm Bookbinder, MD;  Location: [place];  Service: General;  Laterality: Right;  right breast wire guided excisional biopsy  . ganglion cysts  2011    reports that she has never smoked. She has never used smokeless tobacco. She reports that she does not drink alcohol or use drugs. family history includes Aneurysm in her mother; Breast cancer in her other; Diabetes in her other; Gout in her other; Hypertension in her mother and other; Other in her other. Allergies  Allergen Reactions  . Azithromycin Shortness Of Breath    REACTION: rash, prurutis  . Codeine Shortness Of Breath    Causes shortness of breath, rash, itching  . Hydrocodone Shortness Of Breath    Rash, itching  . Ketorolac Tromethamine Shortness Of Breath    Itching, rash  . Prednisone     SOB  . Tramadol     itching   No current outpatient prescriptions  on file prior to visit.   No current facility-administered medications on file prior to visit.    Review of Systems  Constitutional: Negative for other unusual diaphoresis or sweats HENT: Negative for ear discharge or swelling Eyes: Negative for other worsening visual disturbances Respiratory: Negative for stridor or other swelling  Gastrointestinal: Negative for worsening distension or other blood Genitourinary: Negative for retention or other urinary change Musculoskeletal: Negative for other MSK pain or swelling Skin: Negative for color change or other new lesions Neurological: Negative for worsening tremors and other numbness  Psychiatric/Behavioral: Negative for worsening agitation or other fatigue All other system neg per pt    Objective:   Physical Exam LMP 02/18/2017  VS noted,  Constitutional: Pt appears in NAD HENT: Head: NCAT.  Right Ear: External ear normal.  Left Ear: External ear normal.  Eyes: . Pupils are equal, round, and reactive to light. Conjunctivae and EOM are normal Nose: without d/c or deformity Neck: Neck supple. Gross normal ROM Cardiovascular: Normal rate and regular rhythm.   Pulmonary/Chest: Effort normal and breath sounds without rales or wheezing.  Abd:  Soft, NT, ND, + BS, no organomegaly Neurological: Pt is alert. At baseline orientation, motor grossly intact Skin: Skin is warm. No rashes, other new lesions, no LE edema Psychiatric: Pt behavior is normal without agitation but with 2-3+ nervous No other exam findings Lab Results  Component Value Date  WBC 7.7 09/15/2014   HGB 13.4 09/15/2014   HCT 40.7 09/15/2014   PLT 274.0 09/15/2014   GLUCOSE 100 (H) 02/20/2017   CHOL 227 (H) 09/15/2014   TRIG 92.0 09/15/2014   HDL 64.30 09/15/2014   LDLDIRECT 164.5 09/19/2012   LDLCALC 144 (H) 09/15/2014   ALT 14 09/15/2014   AST 12 09/15/2014   NA 138 02/20/2017   K 4.0 02/20/2017   CL 104 02/20/2017   CREATININE 0.80 02/20/2017   BUN 10  02/20/2017   CO2 26 02/20/2017   TSH 0.67 09/15/2014       Assessment & Plan:

## 2017-02-22 NOTE — Assessment & Plan Note (Signed)
D/w pt the nature and natural progression of her illness, which she states was not done in ER.  She appears to be very nervous and has low "focus" and comprehension today, difficult to retain what I tell her, though friend with her seems more successful and tries to be supportive.  Although it is not clear the xarelto is causing the itching, will try to change to eliquis, and if not affordable will need coumadin.  Gave limited rx percocet which she is hesitant to try and wonders if there is anything else.  To cont off BCP and not restart

## 2017-02-22 NOTE — Patient Instructions (Signed)
Please take all new medication as prescribed - the pain medication  OK to stop the xarelto AFTER you have the Eliquis to start tomorrow or the next day (don't stop the xarelto until then)  Please have the pharmacy call if you are not able to afford the Eliquis, to start coumadin instead  Please continue all other medications as before, and refills have been done if requested.  Please have the pharmacy call with any other refills you may need.  Please keep your appointments with your specialists as you may have planned

## 2017-02-22 NOTE — Assessment & Plan Note (Signed)
stable overall by history and exam, recent data reviewed with pt, and pt to continue medical treatment as before,  to f/u any worsening symptoms or concerns BP Readings from Last 3 Encounters:  02/20/17 121/89  02/14/17 112/73  09/15/14 120/78

## 2017-03-02 ENCOUNTER — Ambulatory Visit (INDEPENDENT_AMBULATORY_CARE_PROVIDER_SITE_OTHER): Payer: BLUE CROSS/BLUE SHIELD | Admitting: Internal Medicine

## 2017-03-02 ENCOUNTER — Encounter: Payer: Self-pay | Admitting: Internal Medicine

## 2017-03-02 VITALS — BP 122/86 | HR 77 | Ht 66.0 in | Wt 211.0 lb

## 2017-03-02 DIAGNOSIS — G43811 Other migraine, intractable, with status migrainosus: Secondary | ICD-10-CM

## 2017-03-02 DIAGNOSIS — F411 Generalized anxiety disorder: Secondary | ICD-10-CM

## 2017-03-02 DIAGNOSIS — I82621 Acute embolism and thrombosis of deep veins of right upper extremity: Secondary | ICD-10-CM | POA: Diagnosis not present

## 2017-03-02 DIAGNOSIS — Z114 Encounter for screening for human immunodeficiency virus [HIV]: Secondary | ICD-10-CM | POA: Diagnosis not present

## 2017-03-02 DIAGNOSIS — I10 Essential (primary) hypertension: Secondary | ICD-10-CM | POA: Diagnosis not present

## 2017-03-02 DIAGNOSIS — Z0000 Encounter for general adult medical examination without abnormal findings: Secondary | ICD-10-CM | POA: Diagnosis not present

## 2017-03-02 NOTE — Patient Instructions (Signed)
Please continue all other medications as before, and refills have been done if requested.  Please have the pharmacy call with any other refills you may need.  Please continue your efforts at being more active, low cholesterol diet, and weight control.  You are otherwise up to date with prevention measures today.  Please keep your appointments with your specialists as you may have planned  You will be contacted regarding the referral for: Neurology  Please go to the LAB in the Basement (turn left off the elevator) for the tests to be done today  You will be contacted by phone if any changes need to be made immediately.  Otherwise, you will receive a letter about your results with an explanation, but please check with MyChart first.  Please remember to sign up for MyChart if you have not done so, as this will be important to you in the future with finding out test results, communicating by private email, and scheduling acute appointments online when needed.  Please return in 1 year for your yearly visit, or sooner if needed, with Lab testing done 3-5 days before

## 2017-03-02 NOTE — Progress Notes (Signed)
Subjective:    Patient ID: Lauren Wolfe, female    DOB: [birth year], 41 y.o.   MRN: [id number]  HPI  Here for wellness and f/u;  Overall doing ok;  Pt denies Chest pain, worsening SOB, DOE, wheezing, orthopnea, PND, worsening LE edema, palpitations, dizziness or syncope.  Pt denies neurological change such as new headache, facial or extremity weakness.  Pt denies polydipsia, polyuria, or low sugar symptoms. Pt states overall good compliance with treatment and medications, good tolerability, and has been trying to follow appropriate diet.  Pt denies worsening depressive symptoms, suicidal ideation or panic. No fever, night sweats, wt loss, loss of appetite, or other constitutional symptoms.  Pt states good ability with ADL's, has low fall risk, home safety reviewed and adequate, no other significant changes in hearing or vision, and occasionally active with exercise with going to the gym 3 times per wk. Started the eliquis last Saturday, but wondering if maybe making migraines.  Arm pain better overall; tried oxycodone 1 dose but did not feel "too good" so stopped.  Tends to feel heart beat when lying on the left side.  Chronic HA is her chief concern today.  Current neurologist Dr Melton Alar retiring later this month Past Medical History:  Diagnosis Date  . Anxiety   . Migraine   . Wears glasses    and contacts   Past Surgical History:  Procedure Laterality Date  . BREAST BIOPSY  10/31/2012   Procedure: BREAST BIOPSY WITH NEEDLE LOCALIZATION;  Surgeon: Rolm Bookbinder, MD;  Location: [place];  Service: General;  Laterality: Right;  right breast wire guided excisional biopsy  . ganglion cysts  2011    reports that she has never smoked. She has never used smokeless tobacco. She reports that she does not drink alcohol or use drugs. family history includes Aneurysm in her mother; Breast cancer in her other; Diabetes in her other; Gout in her other; Hypertension in her mother and  other; Other in her other. Allergies  Allergen Reactions  . Azithromycin Shortness Of Breath    REACTION: rash, prurutis  . Codeine Shortness Of Breath    Causes shortness of breath, rash, itching  . Hydrocodone Shortness Of Breath    Rash, itching  . Ketorolac Tromethamine Shortness Of Breath    Itching, rash  . Prednisone     SOB  . Tramadol     itching   Current Outpatient Prescriptions on File Prior to Visit  Medication Sig Dispense Refill  . apixaban (ELIQUIS) 5 MG TABS tablet Take 1 tablet (5 mg total) by mouth 2 (two) times daily. 60 tablet 5   No current facility-administered medications on file prior to visit.     Review of Systems Constitutional: Negative for other unusual diaphoresis, sweats, appetite or weight changes HENT: Negative for other worsening hearing loss, ear pain, facial swelling, mouth sores or neck stiffness.   Eyes: Negative for other worsening pain, redness or other visual disturbance.  Respiratory: Negative for other stridor or swelling Cardiovascular: Negative for other palpitations or other chest pain  Gastrointestinal: Negative for worsening diarrhea or loose stools, blood in stool, distention or other pain Genitourinary: Negative for hematuria, flank pain or other change in urine volume.  Musculoskeletal: Negative for myalgias or other joint swelling.  Skin: Negative for other color change, or other wound or worsening drainage.  Neurological: Negative for other syncope or numbness. Hematological: Negative for other adenopathy or swelling Psychiatric/Behavioral: Negative for hallucinations, other worsening agitation, SI,  self-injury, or new decreased concentration All other system neg per pt    Objective:   Physical Exam BP 122/86   Pulse 77   Ht 5' 6"  (1.676 m)   Wt 211 lb (95.7 kg)   LMP 02/18/2017   SpO2 98%   BMI 34.06 kg/m  VS noted,  Constitutional: Pt is oriented to person, place, and time. Appears well-developed and  well-nourished, in no significant distress and comfortable Head: Normocephalic and atraumatic  Eyes: Conjunctivae and EOM are normal. Pupils are equal, round, and reactive to light Right Ear: External ear normal without discharge Left Ear: External ear normal without discharge Nose: Nose without discharge or deformity Mouth/Throat: Oropharynx is without other ulcerations and moist  Neck: Normal range of motion. Neck supple. No JVD present. No tracheal deviation present or significant neck LA or mass Cardiovascular: Normal rate, regular rhythm, normal heart sounds and intact distal pulses.   Pulmonary/Chest: WOB normal and breath sounds without rales or wheezing  Abdominal: Soft. Bowel sounds are normal. NT. No HSM  Musculoskeletal: Normal range of motion. Exhibits no edema Lymphadenopathy: Has no other cervical adenopathy.  Neurological: Pt is alert and oriented to person, place, and time. Pt has normal reflexes. No cranial nerve deficit. Motor grossly intact, Gait intact Skin: Skin is warm and dry. No rash noted or new ulcerations Psychiatric:  Has nervous mood and affect. Behavior is normal without agitation No other exam findings    Assessment & Plan:

## 2017-03-04 NOTE — Assessment & Plan Note (Signed)
stable overall by history and exam, recent data reviewed with pt, and pt to continue medical treatment as before,  to f/u any worsening symptoms or concerns BP Readings from Last 3 Encounters:  03/02/17 122/86  02/20/17 121/89  02/14/17 112/73

## 2017-03-04 NOTE — Assessment & Plan Note (Signed)
Improved pain and swelling, for contd eliquis as rx

## 2017-03-04 NOTE — Assessment & Plan Note (Signed)
Chronic, declines change in tx at this time

## 2017-03-04 NOTE — Assessment & Plan Note (Signed)

## 2017-03-04 NOTE — Assessment & Plan Note (Signed)
For new neurology referral as Dr Melton Alar is retiring

## 2017-03-06 ENCOUNTER — Encounter: Payer: Self-pay | Admitting: Neurology

## 2017-03-20 ENCOUNTER — Telehealth: Payer: Self-pay | Admitting: Internal Medicine

## 2017-03-20 MED ORDER — TRAMADOL HCL 50 MG PO TABS: 50.0000 mg | ORAL_TABLET | Freq: Three times a day (TID) | ORAL | 0 refills | Status: DC | PRN

## 2017-03-20 NOTE — Telephone Encounter (Signed)
Ok for limited rx tramadol - Done hardcopy to [employer]

## 2017-03-20 NOTE — Telephone Encounter (Signed)
Dr. Jenny Reichmann this is patient that was allergic to most pain meds. Would we be doing a referral now? Please advise.

## 2017-03-20 NOTE — Telephone Encounter (Signed)
Pt called in and said that her arm is still hurting. She was seen 6/1 and would like to know what she should do from here?    Best number [phone]

## 2017-03-21 NOTE — Telephone Encounter (Signed)
Faxed and LVM informing pt.

## 2017-04-17 ENCOUNTER — Telehealth: Payer: Self-pay | Admitting: Internal Medicine

## 2017-04-17 NOTE — Telephone Encounter (Signed)
Pt has been informed and expressed understanding.  

## 2017-04-17 NOTE — Telephone Encounter (Signed)
This is very unlikely unless the rash is a bruise.  OK to continue the eliquis.  May need ROV for rash, but would be up to pt

## 2017-04-17 NOTE — Telephone Encounter (Signed)
Pt called stating that she has noticed rashes on the back of her arms and legs. She is thinking it could be coming from the eloquise. Please advise.

## 2017-04-19 ENCOUNTER — Encounter: Payer: Self-pay | Admitting: Internal Medicine

## 2017-04-19 ENCOUNTER — Ambulatory Visit (INDEPENDENT_AMBULATORY_CARE_PROVIDER_SITE_OTHER): Payer: BLUE CROSS/BLUE SHIELD | Admitting: Internal Medicine

## 2017-04-19 ENCOUNTER — Other Ambulatory Visit (INDEPENDENT_AMBULATORY_CARE_PROVIDER_SITE_OTHER): Payer: BLUE CROSS/BLUE SHIELD

## 2017-04-19 DIAGNOSIS — Z0000 Encounter for general adult medical examination without abnormal findings: Secondary | ICD-10-CM | POA: Diagnosis not present

## 2017-04-19 DIAGNOSIS — R21 Rash and other nonspecific skin eruption: Secondary | ICD-10-CM | POA: Insufficient documentation

## 2017-04-19 DIAGNOSIS — Z114 Encounter for screening for human immunodeficiency virus [HIV]: Secondary | ICD-10-CM

## 2017-04-19 LAB — URINALYSIS, ROUTINE W REFLEX MICROSCOPIC
Bilirubin Urine: NEGATIVE
Ketones, ur: NEGATIVE
Leukocytes, UA: NEGATIVE
Nitrite: NEGATIVE
Total Protein, Urine: NEGATIVE
Urine Glucose: NEGATIVE
Urobilinogen, UA: 0.2 (ref 0.0–1.0)
pH: 6 (ref 5.0–8.0)

## 2017-04-19 LAB — CBC WITH DIFFERENTIAL/PLATELET
BASOS ABS: 0 10*3/uL (ref 0.0–0.1)
BASOS PCT: 0.5 % (ref 0.0–3.0)
EOS PCT: 1.2 % (ref 0.0–5.0)
Eosinophils Absolute: 0.1 10*3/uL (ref 0.0–0.7)
HEMATOCRIT: 44.9 % (ref 36.0–46.0)
Hemoglobin: 14.8 g/dL (ref 12.0–15.0)
LYMPHS ABS: 2.2 10*3/uL (ref 0.7–4.0)
LYMPHS PCT: 37.4 % (ref 12.0–46.0)
MCHC: 33.1 g/dL (ref 30.0–36.0)
MCV: 89.6 fl (ref 78.0–100.0)
MONOS PCT: 8.9 % (ref 3.0–12.0)
Monocytes Absolute: 0.5 10*3/uL (ref 0.1–1.0)
NEUTROS ABS: 3 10*3/uL (ref 1.4–7.7)
NEUTROS PCT: 52 % (ref 43.0–77.0)
PLATELETS: 241 10*3/uL (ref 150.0–400.0)
RBC: 5.01 Mil/uL (ref 3.87–5.11)
RDW: 12.7 % (ref 11.5–15.5)
WBC: 5.8 10*3/uL (ref 4.0–10.5)

## 2017-04-19 LAB — LIPID PANEL
CHOL/HDL RATIO: 3
Cholesterol: 202 mg/dL — ABNORMAL HIGH (ref 0–200)
HDL: 62.1 mg/dL (ref >39.00)
LDL Cholesterol: 127 mg/dL — ABNORMAL HIGH (ref 0–99)
NONHDL: 140.15
Triglycerides: 64 mg/dL (ref 0.0–149.0)
VLDL: 12.8 mg/dL (ref 0.0–40.0)

## 2017-04-19 LAB — HEPATIC FUNCTION PANEL
ALK PHOS: 62 U/L (ref 39–117)
ALT: 12 U/L (ref 0–35)
AST: 12 U/L (ref 0–37)
Albumin: 4.2 g/dL (ref 3.5–5.2)
BILIRUBIN DIRECT: 0.1 mg/dL (ref 0.0–0.3)
BILIRUBIN TOTAL: 0.3 mg/dL (ref 0.2–1.2)
TOTAL PROTEIN: 7.1 g/dL (ref 6.0–8.3)

## 2017-04-19 LAB — BASIC METABOLIC PANEL
BUN: 12 mg/dL (ref 6–23)
CALCIUM: 9.7 mg/dL (ref 8.4–10.5)
CO2: 26 meq/L (ref 19–32)
Chloride: 105 mEq/L (ref 96–112)
Creatinine, Ser: 0.97 mg/dL (ref 0.40–1.20)
GFR: 81.44 mL/min (ref >60.00)
Glucose, Bld: 97 mg/dL (ref 70–99)
Potassium: 3.7 mEq/L (ref 3.5–5.1)
SODIUM: 139 meq/L (ref 135–145)

## 2017-04-19 LAB — TSH: TSH: 1.68 u[IU]/mL (ref 0.35–4.50)

## 2017-04-19 MED ORDER — TRIAMCINOLONE ACETONIDE 0.1 % EX CREA: 1.0000 "application " | TOPICAL_CREAM | Freq: Two times a day (BID) | CUTANEOUS | 0 refills | Status: AC

## 2017-04-19 NOTE — Progress Notes (Signed)
Subjective:    Patient ID: Lauren Wolfe, female    DOB: January 06, 1976, 41 y.o.   MRN: [id number]  HPI  Here with 4 localized areas of itchy dark discoloration to the mid post aspect of each arm and each calf; c/o marked itching, but no pain, fever, swelling,  And Pt denies chest pain, increased sob or doe, wheezing, orthopnea, PND, increased LE swelling, palpitations, dizziness or syncope.  She wants to think it might be the eliquis since "you know I'm allergic to everything."  Ongoing for > 2 wks, no known contact allergen.  No prior hs of same.  Has not tried otc meds such as cortisione or advil   Scratching makes better for a few minutes, but otherwise no other makes better or worse. Past Medical History:  Diagnosis Date  . Anxiety   . Migraine   . Wears glasses    and contacts   Past Surgical History:  Procedure Laterality Date  . BREAST BIOPSY  10/31/2012   Procedure: BREAST BIOPSY WITH NEEDLE LOCALIZATION;  Surgeon: Rolm Bookbinder, MD;  Location: [facility];  Service: General;  Laterality: Right;  right breast wire guided excisional biopsy  . ganglion cysts  2011    reports that she has never smoked. She has never used smokeless tobacco. She reports that she does not drink alcohol or use drugs. family history includes Aneurysm in her mother; Breast cancer in her other; Diabetes in her other; Gout in her other; Hypertension in her mother and other; Other in her other. Allergies  Allergen Reactions  . Azithromycin Shortness Of Breath    REACTION: rash, prurutis  . Codeine Shortness Of Breath    Causes shortness of breath, rash, itching  . Hydrocodone Shortness Of Breath    Rash, itching  . Ketorolac Tromethamine Shortness Of Breath    Itching, rash  . Prednisone     SOB  . Tramadol     itching   Current Outpatient Prescriptions on File Prior to Visit  Medication Sig Dispense Refill  . apixaban (ELIQUIS) 5 MG TABS tablet Take 1 tablet (5 mg total) by mouth  2 (two) times daily. 60 tablet 5  . traMADol (ULTRAM) 50 MG tablet Take 1 tablet (50 mg total) by mouth every 8 (eight) hours as needed. 30 tablet 0   No current facility-administered medications on file prior to visit.    Review of Systems All otherwise neg per pt    Objective:   Physical Exam BP 118/86   Pulse 70   Ht 5' 6"  (1.676 m)   Wt 211 lb (95.7 kg)   SpO2 99%   BMI 34.06 kg/m  VS noted,  Constitutional: Pt appears in NAD HENT: Head: NCAT.  Right Ear: External ear normal.  Left Ear: External ear normal.  Eyes: . Pupils are equal, round, and reactive to light. Conjunctivae and EOM are normal Nose: without d/c or deformity Neck: Neck supple. Gross normal ROM Cardiovascular: Normal rate and regular rhythm.   Pulmonary/Chest: Effort normal and breath sounds without rales or wheezing.  Neurological: Pt is alert. At baseline orientation, motor grossly intact Skin: Skin is warm. + darkened skin < 1/2 cm area rashes only to specific areas of mid post bilat upper arms and mid post bilat calfs, other new lesions, no LE edema Psychiatric: Pt behavior is normal without agitation , 1+ nervous No other exam findings  Pt has not obtained recent labs - states will do today Lab Results  Component Value Date   WBC 7.7 09/15/2014   HGB 13.4 09/15/2014   HCT 40.7 09/15/2014   PLT 274.0 09/15/2014   GLUCOSE 100 (H) 02/20/2017   CHOL 227 (H) 09/15/2014   TRIG 92.0 09/15/2014   HDL 64.30 09/15/2014   LDLDIRECT 164.5 09/19/2012   LDLCALC 144 (H) 09/15/2014   ALT 14 09/15/2014   AST 12 09/15/2014   NA 138 02/20/2017   K 4.0 02/20/2017   CL 104 02/20/2017   CREATININE 0.80 02/20/2017   BUN 10 02/20/2017   CO2 26 02/20/2017   TSH 0.67 09/15/2014       Assessment & Plan:

## 2017-04-19 NOTE — Patient Instructions (Signed)
Please take all new medication as prescribed - the cream  Please continue all other medications as before, and refills have been done if requested.  Please have the pharmacy call with any other refills you may need.  Please continue your efforts at being more active, low cholesterol diet, and weight control  Please keep your appointments with your specialists as you may have planned

## 2017-04-19 NOTE — Assessment & Plan Note (Signed)
Unusual presentation of allergy or hypersensitivity type rash with 4 "symmtric" similar areas of concern to each extremity only; doubt eliquis related, ok for triam cream prn, consider derm referral

## 2017-04-20 LAB — HIV ANTIBODY (ROUTINE TESTING W REFLEX): HIV 1&2 Ab, 4th Generation: NONREACTIVE

## 2017-04-23 ENCOUNTER — Encounter: Payer: Self-pay | Admitting: Neurology

## 2017-04-23 ENCOUNTER — Encounter: Payer: Self-pay | Admitting: *Deleted

## 2017-04-23 ENCOUNTER — Ambulatory Visit (INDEPENDENT_AMBULATORY_CARE_PROVIDER_SITE_OTHER): Payer: BLUE CROSS/BLUE SHIELD | Admitting: Neurology

## 2017-04-23 DIAGNOSIS — G43709 Chronic migraine without aura, not intractable, without status migrainosus: Secondary | ICD-10-CM | POA: Diagnosis not present

## 2017-04-23 DIAGNOSIS — IMO0002 Reserved for concepts with insufficient information to code with codable children: Secondary | ICD-10-CM

## 2017-04-23 MED ORDER — RIZATRIPTAN BENZOATE 10 MG PO TBDP: 10.0000 mg | ORAL_TABLET | ORAL | 11 refills | Status: DC | PRN

## 2017-04-23 MED ORDER — VENLAFAXINE HCL ER 37.5 MG PO CP24: 37.5000 mg | ORAL_CAPSULE | Freq: Every day | ORAL | 11 refills | Status: DC
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
## 2017-05-22 ENCOUNTER — Encounter: Payer: Self-pay | Admitting: *Deleted

## 2017-05-22 ENCOUNTER — Telehealth: Payer: Self-pay | Admitting: Neurology

## 2017-05-22 MED ORDER — INDOMETHACIN 25 MG PO CAPS: 25.0000 mg | ORAL_CAPSULE | Freq: Three times a day (TID) | ORAL | 0 refills | Status: DC | PRN

## 2017-05-22 NOTE — Addendum Note (Signed)
Addended by: Desmond Lope on: 05/22/2017 03:47 PM   Modules accepted: Orders

## 2017-05-22 NOTE — Telephone Encounter (Signed)
Returned call to patient - she has failed home medications (Tramadol, Maxalt and Zofran).  Rates migraine at 9 on pain scale.  She has multiple allergies - confirmed and reviewed by Dr. Felecia Shelling, who is the work-in MD this afternoon.  He has provided her with a prescription of indomethacin 37m, TID PRN.  Patient agreeable to try this medication.  Prescription sent to requested pharmacy.

## 2017-05-22 NOTE — Telephone Encounter (Signed)
Patient stating has migraine x 2 days. She is taking venlafaxine XR (EFFEXOR XR) 37.5 MG 24 hr capsule and rizatriptan (MAXALT-MLT) 10 MG disintegrating tablet but has not helped.  I advised Dr. Krista Blue is out of the office but I will send message to her nurse.

## 2017-05-22 NOTE — Telephone Encounter (Signed)
Left message requesting a return call.

## 2017-06-03 ENCOUNTER — Emergency Department (HOSPITAL_BASED_OUTPATIENT_CLINIC_OR_DEPARTMENT_OTHER): Payer: BLUE CROSS/BLUE SHIELD

## 2017-06-03 ENCOUNTER — Emergency Department (HOSPITAL_BASED_OUTPATIENT_CLINIC_OR_DEPARTMENT_OTHER)
Admission: EM | Admit: 2017-06-03 | Discharge: 2017-06-03 | Disposition: A | Discharge status: Home / Self Care | Payer: BLUE CROSS/BLUE SHIELD | Attending: Emergency Medicine | Admitting: Emergency Medicine

## 2017-06-03 ENCOUNTER — Encounter (HOSPITAL_BASED_OUTPATIENT_CLINIC_OR_DEPARTMENT_OTHER): Payer: Self-pay | Admitting: Emergency Medicine

## 2017-06-03 DIAGNOSIS — Z7901 Long term (current) use of anticoagulants: Secondary | ICD-10-CM | POA: Insufficient documentation

## 2017-06-03 DIAGNOSIS — M791 Myalgia: Secondary | ICD-10-CM | POA: Insufficient documentation

## 2017-06-03 DIAGNOSIS — M79601 Pain in right arm: Secondary | ICD-10-CM | POA: Insufficient documentation

## 2017-06-03 DIAGNOSIS — Z79899 Other long term (current) drug therapy: Secondary | ICD-10-CM | POA: Insufficient documentation

## 2017-06-03 DIAGNOSIS — M7918 Myalgia, other site: Secondary | ICD-10-CM

## 2017-06-03 DIAGNOSIS — I10 Essential (primary) hypertension: Secondary | ICD-10-CM | POA: Insufficient documentation

## 2017-06-03 MED ORDER — CYCLOBENZAPRINE HCL 10 MG PO TABS
10.0000 mg | ORAL_TABLET | Freq: Once | ORAL | Status: AC
  Administered 2017-06-03: 10 mg via ORAL
  Filled 2017-06-03: qty 1

## 2017-06-03 MED ORDER — CYCLOBENZAPRINE HCL 10 MG PO TABS: 10.0000 mg | ORAL_TABLET | Freq: Two times a day (BID) | ORAL | 0 refills | Status: DC | PRN

## 2017-06-03 NOTE — ED Notes (Signed)
Pt given rx x 1 for flexeril and note for work. Family present to drive. Ice pack given for home use
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
## 2017-06-13 ENCOUNTER — Ambulatory Visit: Payer: BLUE CROSS/BLUE SHIELD | Admitting: Neurology

## 2017-06-28 ENCOUNTER — Telehealth: Payer: Self-pay | Admitting: Internal Medicine

## 2017-06-28 DIAGNOSIS — M79602 Pain in left arm: Secondary | ICD-10-CM

## 2017-06-28 NOTE — Telephone Encounter (Signed)
Yes, but the sports medicine providers are physicians, so she can consider making an appt ; regardless she has been referred for PT at her preferred location

## 2017-06-28 NOTE — Telephone Encounter (Signed)
Pt was informed and expressed understanding. She stated [facility] closer to her job in [facility] and more convenient for her to get to.
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
## 2017-06-28 NOTE — Telephone Encounter (Signed)
Ok , this is done  She should consider making appt with our Sports Medicine providers as well

## 2017-07-02 NOTE — Telephone Encounter (Signed)
Patient has had bad migraine since yesterday. She tried indomethacin (INDOCIN) 25 MG capsule and it has not helped. Can something else be called to [facility] on [address].

## 2017-07-02 NOTE — Telephone Encounter (Signed)
Spoke to patient - migraine not responding to home meds (rizatriptan, ondansetron).  She does not wish to have nerve blocks.  Per vo by Dr. Krista Blue, she can come to the office with a driver for the following infusion: 1) VPA 1gram 2) compazine 104m Patient agreeable to this plan and has been placed on the schedule with [facility].

## 2017-07-03 ENCOUNTER — Encounter: Payer: Self-pay | Admitting: Neurology

## 2017-07-04 ENCOUNTER — Encounter: Payer: Self-pay | Admitting: *Deleted

## 2017-07-09 ENCOUNTER — Ambulatory Visit: Payer: BLUE CROSS/BLUE SHIELD | Attending: Internal Medicine | Admitting: Physical Therapy

## 2017-07-09 DIAGNOSIS — R293 Abnormal posture: Secondary | ICD-10-CM | POA: Insufficient documentation

## 2017-07-09 DIAGNOSIS — R29898 Other symptoms and signs involving the musculoskeletal system: Secondary | ICD-10-CM | POA: Insufficient documentation

## 2017-07-09 DIAGNOSIS — M25511 Pain in right shoulder: Secondary | ICD-10-CM | POA: Insufficient documentation

## 2017-07-09 NOTE — Patient Instructions (Signed)
Flexion (Eccentric) - Active (Cane)    Lift cane with both hands. Avoid hiking shoulders.  _15__ reps per set, __2_ sets per day   Cane Exercise: Abduction    Hold cane with right hand over end, palm-up, with other hand palm-down. Move arm out from side and up by pushing with other arm. Hold __5-10__ seconds. Repeat __15__ times. Do _2___ sessions per day.  Shoulder (Scapula) Retraction    Pull shoulders back, squeezing shoulder blades together. Repeat _15___ times per session. Do _2___ sessions per week. Position: Standing    Resistive Band Rowing    With resistive band anchored in door, grasp both ends. Keeping elbows bent, pull back, squeezing shoulder blades together. Hold _5___ seconds. Repeat __15__ times. Do __2__ sessions per day.  INTERNAL ROTATION: Sitting - Resistance Band (Active)    Sit, right arm bent to 90, elbow against side, forearm out from body. Against red resistance band, rotate arm in to body, keeping elbow at side. Complete _2__ sets of _15__ repetitions.   EXTERNAL ROTATION: Sitting - Resistance Band (Active)    Sit, right arm elbow bent to 90, elbow against side, hand forward. Against red resistance band, rotate forearm outward, keeping elbow at side. Rotate forearm outward as far as possible. Complete __2_ sets of _15__ repetitions.
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
## 2017-07-16 ENCOUNTER — Ambulatory Visit: Payer: BLUE CROSS/BLUE SHIELD | Admitting: Physical Therapy

## 2017-07-16 DIAGNOSIS — R29898 Other symptoms and signs involving the musculoskeletal system: Secondary | ICD-10-CM

## 2017-07-16 DIAGNOSIS — M25511 Pain in right shoulder: Secondary | ICD-10-CM

## 2017-07-16 DIAGNOSIS — R293 Abnormal posture: Secondary | ICD-10-CM
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
## 2017-07-18 ENCOUNTER — Ambulatory Visit: Payer: BLUE CROSS/BLUE SHIELD

## 2017-07-18 DIAGNOSIS — R293 Abnormal posture: Secondary | ICD-10-CM

## 2017-07-18 DIAGNOSIS — R29898 Other symptoms and signs involving the musculoskeletal system: Secondary | ICD-10-CM

## 2017-07-18 DIAGNOSIS — M25511 Pain in right shoulder: Secondary | ICD-10-CM | POA: Diagnosis not present
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
## 2017-07-23 ENCOUNTER — Ambulatory Visit: Payer: BLUE CROSS/BLUE SHIELD | Admitting: Physical Therapy

## 2017-07-24 ENCOUNTER — Ambulatory Visit (INDEPENDENT_AMBULATORY_CARE_PROVIDER_SITE_OTHER): Payer: BLUE CROSS/BLUE SHIELD | Admitting: Neurology

## 2017-07-24 ENCOUNTER — Encounter: Payer: Self-pay | Admitting: Neurology

## 2017-07-24 VITALS — BP 137/83 | HR 65 | Ht 66.0 in | Wt 218.0 lb

## 2017-07-24 DIAGNOSIS — IMO0002 Reserved for concepts with insufficient information to code with codable children: Secondary | ICD-10-CM

## 2017-07-24 DIAGNOSIS — G43709 Chronic migraine without aura, not intractable, without status migrainosus: Secondary | ICD-10-CM | POA: Diagnosis not present

## 2017-07-24 MED ORDER — ONDANSETRON 8 MG PO TBDP: 8.0000 mg | ORAL_TABLET | Freq: Three times a day (TID) | ORAL | 11 refills | Status: DC | PRN
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
## 2017-07-25 ENCOUNTER — Ambulatory Visit: Payer: BLUE CROSS/BLUE SHIELD | Admitting: Physical Therapy

## 2017-07-25 DIAGNOSIS — M25511 Pain in right shoulder: Secondary | ICD-10-CM | POA: Diagnosis not present

## 2017-07-25 DIAGNOSIS — R293 Abnormal posture: Secondary | ICD-10-CM

## 2017-07-25 DIAGNOSIS — R29898 Other symptoms and signs involving the musculoskeletal system: Secondary | ICD-10-CM
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
## 2017-07-30 ENCOUNTER — Ambulatory Visit: Payer: BLUE CROSS/BLUE SHIELD | Admitting: Physical Therapy

## 2017-08-01 ENCOUNTER — Ambulatory Visit: Payer: BLUE CROSS/BLUE SHIELD | Admitting: Physical Therapy

## 2017-08-01 DIAGNOSIS — R29898 Other symptoms and signs involving the musculoskeletal system: Secondary | ICD-10-CM

## 2017-08-01 DIAGNOSIS — R293 Abnormal posture: Secondary | ICD-10-CM

## 2017-08-01 DIAGNOSIS — M25511 Pain in right shoulder: Secondary | ICD-10-CM
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
## 2017-08-06 ENCOUNTER — Ambulatory Visit: Payer: BLUE CROSS/BLUE SHIELD | Attending: Internal Medicine

## 2017-08-06 DIAGNOSIS — M25511 Pain in right shoulder: Secondary | ICD-10-CM

## 2017-08-06 DIAGNOSIS — R293 Abnormal posture: Secondary | ICD-10-CM | POA: Diagnosis present

## 2017-08-06 DIAGNOSIS — R29898 Other symptoms and signs involving the musculoskeletal system: Secondary | ICD-10-CM | POA: Insufficient documentation
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
## 2017-08-08 ENCOUNTER — Ambulatory Visit: Payer: BLUE CROSS/BLUE SHIELD

## 2017-08-08 DIAGNOSIS — R293 Abnormal posture: Secondary | ICD-10-CM

## 2017-08-08 DIAGNOSIS — M25511 Pain in right shoulder: Secondary | ICD-10-CM

## 2017-08-08 DIAGNOSIS — R29898 Other symptoms and signs involving the musculoskeletal system: Secondary | ICD-10-CM
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
## 2017-08-15 ENCOUNTER — Ambulatory Visit: Payer: BLUE CROSS/BLUE SHIELD | Admitting: Physical Therapy

## 2017-08-17 ENCOUNTER — Ambulatory Visit: Payer: BLUE CROSS/BLUE SHIELD

## 2017-08-17 ENCOUNTER — Telehealth: Payer: Self-pay | Admitting: *Deleted

## 2017-08-17 DIAGNOSIS — R29898 Other symptoms and signs involving the musculoskeletal system: Secondary | ICD-10-CM

## 2017-08-17 DIAGNOSIS — R293 Abnormal posture: Secondary | ICD-10-CM

## 2017-08-17 DIAGNOSIS — M25511 Pain in right shoulder: Secondary | ICD-10-CM | POA: Diagnosis not present
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
## 2017-08-17 NOTE — Telephone Encounter (Signed)
Pt FMLA form on [employer].

## 2017-08-20 NOTE — Telephone Encounter (Signed)
FMLA completed, to Dr. Krista Blue for review and signature.

## 2017-08-21 NOTE — Telephone Encounter (Signed)
Dr. Krista Blue has signed the form and it has been placed in the pick up bin to be returned to medical records.

## 2017-08-22 ENCOUNTER — Other Ambulatory Visit: Payer: Self-pay | Admitting: *Deleted

## 2017-08-22 ENCOUNTER — Telehealth: Payer: Self-pay | Admitting: Neurology

## 2017-08-22 ENCOUNTER — Telehealth: Payer: Self-pay | Admitting: *Deleted

## 2017-08-22 NOTE — Telephone Encounter (Signed)
Called [employer] support. Spoke with [employer]. He stated patient has already received free trial doses x2. Onto bridge program while trying to get insurance approval. She has received the first dose via bridge program. Spoke with [place] (Tourist information centre manager) who also verified that we need to complete PA via her insurance.

## 2017-08-22 NOTE — Telephone Encounter (Signed)
Carly with [employer] is following up on PA for Erenumab-aooe (AIMOVIG 140 DOSE [place]). Ref#[id number].

## 2017-08-22 NOTE — Telephone Encounter (Signed)
Pt flma form faxed to Elisabeth Cara [phone]

## 2017-08-27 ENCOUNTER — Telehealth: Payer: Self-pay | Admitting: Neurology

## 2017-08-27 ENCOUNTER — Encounter: Payer: Self-pay | Admitting: *Deleted

## 2017-08-27 DIAGNOSIS — Z0289 Encounter for other administrative examinations: Secondary | ICD-10-CM

## 2017-08-27 NOTE — Telephone Encounter (Addendum)
Attempted PA - both Aimovig and Ajovy are excluded from coverage with her insurance plan.  She will continue receiving Aimovig through the bridge program for now.  We have faxed a letter to [employer] [phone], fax:[phone]) in effort to get the medication approved.

## 2017-08-27 NOTE — Telephone Encounter (Signed)
Received denial of requested appeal stating that the decision was being upheld due to the medication being excluded by patient's insurance plan.  She will continue receiving medication through the bridge program for now.

## 2017-08-27 NOTE — Telephone Encounter (Signed)
Patient requesting Rx for Erenumab-aooe (AIMOVIG 140 DOSE [place]) called to [facility] on [address] in [place].

## 2017-08-28 ENCOUNTER — Encounter: Payer: Self-pay | Admitting: *Deleted

## 2017-08-28 ENCOUNTER — Ambulatory Visit: Payer: BLUE CROSS/BLUE SHIELD

## 2017-08-28 MED ORDER — ERENUMAB-AOOE 70 MG/ML ~~LOC~~ SOAJ
140.0000 mg | SUBCUTANEOUS | 11 refills | Status: DC
Start: 2017-08-28 — End: 2018-11-29

## 2017-08-28 NOTE — Telephone Encounter (Signed)
Spoke to patient - she has talked with her Aimovig case Insurance underwriter.  She will be able to continue getting the medication for the next year for a $5 co-pay.  The case worker asked that a prescription be sent to her retail pharmacy.  Rx sent.

## 2017-08-28 NOTE — Addendum Note (Signed)
Addended by: Noberto Retort C on: 08/28/2017 01:50 PM   Modules accepted: Orders

## 2017-08-31 ENCOUNTER — Ambulatory Visit: Payer: BLUE CROSS/BLUE SHIELD | Admitting: Physical Therapy

## 2017-11-26 ENCOUNTER — Telehealth: Payer: Self-pay | Admitting: Neurology

## 2017-11-26 NOTE — Telephone Encounter (Signed)
Pt called wanting to be seen sooner advised pt there wasn't anything sooner with the NP but could add her to the wait list and send a note to RN. Pt stating she has had a migraine non stop for about 3 weeks now. She had tried to put it off but its gotten worse to where its getting in the way of work. Pt requesting a call back to discuss options or be worked in sooner

## 2017-11-26 NOTE — Telephone Encounter (Signed)
Spoke to patient - she has been worked into Dr. Rhea Belton schedule this week.

## 2017-11-28 ENCOUNTER — Encounter: Payer: Self-pay | Admitting: Neurology

## 2017-11-28 ENCOUNTER — Ambulatory Visit: Payer: BLUE CROSS/BLUE SHIELD | Admitting: Neurology

## 2017-11-28 VITALS — BP 142/96 | HR 90 | Ht 66.0 in | Wt 219.0 lb

## 2017-11-28 DIAGNOSIS — IMO0002 Reserved for concepts with insufficient information to code with codable children: Secondary | ICD-10-CM

## 2017-11-28 DIAGNOSIS — G43709 Chronic migraine without aura, not intractable, without status migrainosus: Secondary | ICD-10-CM | POA: Diagnosis not present

## 2017-11-28 MED ORDER — VERAPAMIL HCL ER 120 MG PO TBCR
120.0000 mg | EXTENDED_RELEASE_TABLET | Freq: Every day | ORAL | 11 refills | Status: DC
Start: 2017-11-28 — End: 2018-11-29

## 2017-11-28 MED ORDER — SUMATRIPTAN SUCCINATE 6 MG/0.5ML ~~LOC~~ SOLN: 6.0000 mg | SUBCUTANEOUS | 11 refills | Status: DC | PRN
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
## 2017-11-30 ENCOUNTER — Telehealth: Payer: Self-pay | Admitting: Neurology

## 2017-11-30 NOTE — Telephone Encounter (Signed)
Pt called insurance will not cover SUMAtriptan (IMITREX) 6 MG/0.5ML SOLN injection, she said it will cover a lower mg. Please call to advise

## 2017-12-03 ENCOUNTER — Telehealth: Payer: Self-pay | Admitting: Neurology

## 2017-12-03 MED ORDER — INDOMETHACIN 25 MG PO CAPS: 25.0000 mg | ORAL_CAPSULE | Freq: Three times a day (TID) | ORAL | 0 refills | Status: DC | PRN

## 2017-12-03 NOTE — Telephone Encounter (Signed)
Pt is asking for a refill prescription of indomethacin (INDOCIN) 25 MG capsule [facility]  [id number](Phone) [phone](Fax)

## 2017-12-03 NOTE — Telephone Encounter (Signed)
Pt is asking for a call back to know if the fax she sent earlier today was received re: Medical Certification.  Please call

## 2017-12-03 NOTE — Telephone Encounter (Signed)
Spoke with Lauren Wolfe.  I have tried to complete a PA for Imitrex with her ins. co, but was told by [facility] that her policy termed on 5/0/38.  She no longer has ins. with them.  [name] sts. she now has [place], will fax a  copy of the new ins. card to [name].  Sts. she already picked Imitrex up from the pharmacy tho, has used 2 inj. over the weekend, with no relief.  Per Dr. Brayton Mars note, she also has tried and failed Maxalt, NSAIDS, Topamax, Zonisamide, beta blockers, Nortriptyline, Botox, TPI's, Frova, Zomig, Relpax, Amerge, ASA.   She sts. that in the past, Imitrex has been more effective if she takes Indomethacin with it.  Per YY, ok to r/f Indomethacin, and if this does not help, may offer IV infusion for h/a/fim

## 2017-12-03 NOTE — Telephone Encounter (Signed)
Per YY, ok for Indomethacin.  [facility]fim

## 2017-12-03 NOTE — Addendum Note (Signed)
Addended by: France Ravens I on: 12/03/2017 03:48 PM   Modules accepted: Orders

## 2017-12-03 NOTE — Telephone Encounter (Signed)
[facility], and she will call back if no relief and she needs IV/fim

## 2017-12-04 ENCOUNTER — Telehealth: Payer: Self-pay | Admitting: *Deleted

## 2017-12-04 NOTE — Telephone Encounter (Signed)
Pt advanced home care form on Welcome desk

## 2017-12-04 NOTE — Telephone Encounter (Signed)
See telephone encounter from 12/04/17. Forms received

## 2017-12-05 ENCOUNTER — Telehealth: Payer: Self-pay | Admitting: *Deleted

## 2017-12-05 NOTE — Telephone Encounter (Signed)
Form for [facility] has been completed and signed by Dr. Krista Blue. Will send to MR for completion.

## 2018-01-22 ENCOUNTER — Ambulatory Visit: Payer: BLUE CROSS/BLUE SHIELD | Admitting: Adult Health

## 2018-01-22 ENCOUNTER — Other Ambulatory Visit: Payer: Self-pay | Admitting: Neurology

## 2018-02-28 ENCOUNTER — Telehealth: Payer: Self-pay | Admitting: Neurology

## 2018-02-28 NOTE — Telephone Encounter (Signed)
Pt called stating she has had a migraines for about 3 weeks, stating she has tired all medications and injections but nothing has budget the migraines. Please call to advise

## 2018-02-28 NOTE — Telephone Encounter (Addendum)
Patient has responded well to IV Depakote in the past.  She did not like the way compazine made her feel so she did not want it again.  Per vo by Dr .Krista Blue, ok for patient to come in for Depakote 1gram IV.  She is unable to come today.  She has been scheduled with [facility] at 9:30am on 03/01/18.  She will bring a driver with her to this appt.  A signed MD order has been provided to [facility].

## 2018-03-05 MED ORDER — INDOMETHACIN 25 MG PO CAPS: 25.0000 mg | ORAL_CAPSULE | Freq: Three times a day (TID) | ORAL | 0 refills | Status: DC | PRN

## 2018-03-05 MED ORDER — TIZANIDINE HCL 4 MG PO CAPS: 4.0000 mg | ORAL_CAPSULE | Freq: Three times a day (TID) | ORAL | 0 refills | Status: DC | PRN

## 2018-03-05 NOTE — Telephone Encounter (Signed)
Per vo by Dr. Krista Blue, she can try home remedy of the following medications:    1) indomethacin 16m 2) sumatriptan injection 669m3) tizanidine 40m87m) ondansetron 8mg56mhe should rest in addition to trying this combination of medication.  She is agreeable to try this treatment and will call us [place] if her migraine persist.  Need refills have been sent to the pharmacy.

## 2018-03-05 NOTE — Telephone Encounter (Signed)
Pt called stating that her migraine is still frequent, stating the IV Depakote helped for a few hours but requesting a call to discuss other options.

## 2018-03-05 NOTE — Addendum Note (Signed)
Addended by: Noberto Retort C on: 03/05/2018 04:00 PM   Modules accepted: Orders

## 2018-04-01 ENCOUNTER — Other Ambulatory Visit: Payer: Self-pay | Admitting: Neurology

## 2018-08-12 ENCOUNTER — Other Ambulatory Visit: Payer: Self-pay | Admitting: Neurology

## 2018-10-15 ENCOUNTER — Other Ambulatory Visit: Payer: Self-pay | Admitting: Neurology

## 2018-10-21 ENCOUNTER — Encounter: Payer: Self-pay | Admitting: Internal Medicine

## 2018-10-21 ENCOUNTER — Other Ambulatory Visit (INDEPENDENT_AMBULATORY_CARE_PROVIDER_SITE_OTHER): Payer: 59

## 2018-10-21 ENCOUNTER — Ambulatory Visit (INDEPENDENT_AMBULATORY_CARE_PROVIDER_SITE_OTHER): Payer: 59 | Admitting: Internal Medicine

## 2018-10-21 VITALS — BP 118/76 | HR 79 | Temp 98.4°F | Ht 66.0 in | Wt 220.0 lb

## 2018-10-21 DIAGNOSIS — Z0000 Encounter for general adult medical examination without abnormal findings: Secondary | ICD-10-CM

## 2018-10-21 DIAGNOSIS — I10 Essential (primary) hypertension: Secondary | ICD-10-CM | POA: Diagnosis not present

## 2018-10-21 DIAGNOSIS — E785 Hyperlipidemia, unspecified: Secondary | ICD-10-CM | POA: Insufficient documentation

## 2018-10-21 LAB — BASIC METABOLIC PANEL
BUN: 9 mg/dL (ref 6–23)
CO2: 29 mEq/L (ref 19–32)
Calcium: 9.6 mg/dL (ref 8.4–10.5)
Chloride: 102 mEq/L (ref 96–112)
Creatinine, Ser: 0.86 mg/dL (ref 0.40–1.20)
GFR: 87.4 mL/min (ref >60.00)
GLUCOSE: 98 mg/dL (ref 70–99)
Potassium: 3.8 mEq/L (ref 3.5–5.1)
SODIUM: 139 meq/L (ref 135–145)

## 2018-10-21 LAB — URINALYSIS, ROUTINE W REFLEX MICROSCOPIC
Bilirubin Urine: NEGATIVE
Hgb urine dipstick: NEGATIVE
Ketones, ur: NEGATIVE
Leukocytes, UA: NEGATIVE
Nitrite: NEGATIVE
SPECIFIC GRAVITY, URINE: 1.01 (ref 1.000–1.030)
Total Protein, Urine: NEGATIVE
UROBILINOGEN UA: 0.2 (ref 0.0–1.0)
Urine Glucose: NEGATIVE
WBC, UA: NONE SEEN (ref 0–?)
pH: 7.5 (ref 5.0–8.0)

## 2018-10-21 LAB — CBC WITH DIFFERENTIAL/PLATELET
Basophils Absolute: 0.1 10*3/uL (ref 0.0–0.1)
Basophils Relative: 0.7 % (ref 0.0–3.0)
Eosinophils Absolute: 0.1 10*3/uL (ref 0.0–0.7)
Eosinophils Relative: 1 % (ref 0.0–5.0)
HCT: 44.4 % (ref 36.0–46.0)
HEMOGLOBIN: 14.7 g/dL (ref 12.0–15.0)
Lymphocytes Relative: 32.8 % (ref 12.0–46.0)
Lymphs Abs: 2.9 10*3/uL (ref 0.7–4.0)
MCHC: 33.2 g/dL (ref 30.0–36.0)
MCV: 89.5 fl (ref 78.0–100.0)
Monocytes Absolute: 0.9 10*3/uL (ref 0.1–1.0)
Monocytes Relative: 10 % (ref 3.0–12.0)
NEUTROS PCT: 55.5 % (ref 43.0–77.0)
Neutro Abs: 4.9 10*3/uL (ref 1.4–7.7)
Platelets: 277 10*3/uL (ref 150.0–400.0)
RBC: 4.96 Mil/uL (ref 3.87–5.11)
RDW: 13.2 % (ref 11.5–15.5)
WBC: 8.8 10*3/uL (ref 4.0–10.5)

## 2018-10-21 LAB — HEPATIC FUNCTION PANEL
ALT: 20 U/L (ref 0–35)
AST: 17 U/L (ref 0–37)
Albumin: 4.3 g/dL (ref 3.5–5.2)
Alkaline Phosphatase: 77 U/L (ref 39–117)
Bilirubin, Direct: 0.1 mg/dL (ref 0.0–0.3)
Total Bilirubin: 0.3 mg/dL (ref 0.2–1.2)
Total Protein: 7.3 g/dL (ref 6.0–8.3)

## 2018-10-21 LAB — LIPID PANEL
Cholesterol: 191 mg/dL (ref 0–200)
HDL: 60.4 mg/dL (ref >39.00)
LDL Cholesterol: 109 mg/dL — ABNORMAL HIGH (ref 0–99)
NonHDL: 130.42
Total CHOL/HDL Ratio: 3
Triglycerides: 109 mg/dL (ref 0.0–149.0)
VLDL: 21.8 mg/dL (ref 0.0–40.0)

## 2018-10-21 LAB — TSH: TSH: 1.85 u[IU]/mL (ref 0.35–4.50)

## 2018-10-21 MED ORDER — TRIAMCINOLONE ACETONIDE 0.1 % EX CREA: 1.0000 "application " | TOPICAL_CREAM | Freq: Two times a day (BID) | CUTANEOUS | 2 refills | Status: DC

## 2018-10-21 NOTE — Assessment & Plan Note (Signed)
Mild LDL elevation, goal < 100. Declines statin, for lower chol diet

## 2018-10-21 NOTE — Assessment & Plan Note (Signed)

## 2018-10-21 NOTE — Patient Instructions (Signed)
Please take all new medication as prescribed - the cream for the eczema  Please continue all other medications as before, and refills have been done if requested.  Please have the pharmacy call with any other refills you may need.  Please continue your efforts at being more active, low cholesterol diet, and weight control.  You are otherwise up to date with prevention measures today.  Please keep your appointments with your specialists as you may have planned  Please go to the LAB in the Basement (turn left off the elevator) for the tests to be done today  You will be contacted by phone if any changes need to be made immediately.  Otherwise, you will receive a letter about your results with an explanation, but please check with MyChart first.  Please remember to sign up for MyChart if you have not done so, as this will be important to you in the future with finding out test results, communicating by private email, and scheduling acute appointments online when needed.  Please return in 1 year for your yearly visit, or sooner if needed, with Lab testing done 3-5 days before

## 2018-10-21 NOTE — Progress Notes (Signed)
Subjective:    Patient ID: Lauren Wolfe, female    DOB: 10-Oct-1975, 43 y.o.   MRN: [id number]  HPI  Here for wellness and f/u;  Overall doing ok;  Pt denies Chest pain, worsening SOB, DOE, wheezing, orthopnea, PND, worsening LE edema, palpitations, dizziness or syncope.  Pt denies neurological change such as new headache, facial or extremity weakness.  Pt denies polydipsia, polyuria, or low sugar symptoms. Pt states overall good compliance with treatment and medications, good tolerability, and has been trying to follow appropriate diet.  Pt denies worsening depressive symptoms, suicidal ideation or panic. No fever, night sweats, wt loss, loss of appetite, or other constitutional symptoms.  Pt states good ability with ADL's, has low fall risk, home safety reviewed and adequate, no other significant changes in hearing or vision, and only occasionally active with exercise.  No new complaints.  Goes to GYM 3 times per wk, keeps gaining wt Wt Readings from Last 3 Encounters:  10/21/18 220 lb (99.8 kg)  11/28/17 219 lb (99.3 kg)  07/24/17 218 lb (98.9 kg)  Due for pap smear in June, with mammogram included. Past Medical History:  Diagnosis Date  . Anxiety   . Arm vein blood clot, right   . Migraine   . Wears glasses    and contacts   Past Surgical History:  Procedure Laterality Date  . BREAST BIOPSY  10/31/2012   Procedure: BREAST BIOPSY WITH NEEDLE LOCALIZATION;  Surgeon: Rolm Bookbinder, MD;  Location: [facility];  Service: General;  Laterality: Right;  right breast wire guided excisional biopsy  . ganglion cysts  2011  . WRIST SURGERY Left     reports that she has never smoked. She has never used smokeless tobacco. She reports that she does not drink alcohol or use drugs. family history includes Aneurysm in her mother; Breast cancer in her cousin and another family member; Diabetes in an other family member; Gout in an other family member; Hypertension in her mother and  another family member; Migraines in her paternal aunt; Other in an other family member; Stomach cancer in her father. Allergies  Allergen Reactions  . Azithromycin Shortness Of Breath    REACTION: rash, prurutis  . Codeine Shortness Of Breath    Causes shortness of breath, rash, itching  . Hydrocodone Shortness Of Breath    Rash, itching  . Ketorolac Tromethamine Shortness Of Breath    Itching, rash  . Prednisone     SOB  . Tramadol     itching   Current Outpatient Medications on File Prior to Visit  Medication Sig Dispense Refill  . Erenumab-aooe (AIMOVIG 140 DOSE) 70 MG/ML SOAJ Inject 140 mg into the skin every 30 (thirty) days. 2 pen 11  . indomethacin (INDOCIN) 25 MG capsule TAKE ONE CAPSULE BY MOUTH 3 TIMES A DAY AS NEEDED MUST LAST 30 DAYS 30 capsule 0  . ondansetron (ZOFRAN-ODT) 8 MG disintegrating tablet TAKE 1 TABLET (8 MG TOTAL) BY MOUTH EVERY 8 (EIGHT) HOURS AS NEEDED FOR NAUSEA OR VOMITING. 20 tablet 2  . rizatriptan (MAXALT-MLT) 10 MG disintegrating tablet Take 1 tablet (10 mg total) by mouth as needed. May repeat in 2 hours if needed 12 tablet 11  . SUMAtriptan (IMITREX) 6 MG/0.5ML SOLN injection Inject 0.5 mLs (6 mg total) into the skin every 2 (two) hours as needed for migraine or headache. May repeat in 2 hours if headache persists or recurs. 12 vial 11  . tiZANidine (ZANAFLEX) 4 MG capsule  Take 1 capsule (4 mg total) by mouth 3 (three) times daily as needed for muscle spasms. 30 capsule 0  . verapamil (CALAN-SR) 120 MG CR tablet Take 1 tablet (120 mg total) by mouth at bedtime. 30 tablet 11   No current facility-administered medications on file prior to visit.    Review of Systems Constitutional: Negative for other unusual diaphoresis, sweats, appetite or weight changes HENT: Negative for other worsening hearing loss, ear pain, facial swelling, mouth sores or neck stiffness.   Eyes: Negative for other worsening pain, redness or other visual disturbance.  Respiratory:  Negative for other stridor or swelling Cardiovascular: Negative for other palpitations or other chest pain  Gastrointestinal: Negative for worsening diarrhea or loose stools, blood in stool, distention or other pain Genitourinary: Negative for hematuria, flank pain or other change in urine volume.  Musculoskeletal: Negative for myalgias or other joint swelling.  Skin: Negative for other color change, or other wound or worsening drainage.  Neurological: Negative for other syncope or numbness. Hematological: Negative for other adenopathy or swelling Psychiatric/Behavioral: Negative for hallucinations, other worsening agitation, SI, self-injury, or new decreased concentration All other system neg per pt    Objective:   Physical Exam BP 118/76   Pulse 79   Temp 98.4 F (36.9 C) (Oral)   Ht 5' 6"  (1.676 m)   Wt 220 lb (99.8 kg)   SpO2 98%   BMI 35.51 kg/m  VS noted,  Constitutional: Pt is oriented to person, place, and time. Appears well-developed and well-nourished, in no significant distress and comfortable Head: Normocephalic and atraumatic  Eyes: Conjunctivae and EOM are normal. Pupils are equal, round, and reactive to light Right Ear: External ear normal without discharge Left Ear: External ear normal without discharge Nose: Nose without discharge or deformity Mouth/Throat: Oropharynx is without other ulcerations and moist  Neck: Normal range of motion. Neck supple. No JVD present. No tracheal deviation present or significant neck LA or mass Cardiovascular: Normal rate, regular rhythm, normal heart sounds and intact distal pulses.   Pulmonary/Chest: WOB normal and breath sounds without rales or wheezing  Abdominal: Soft. Bowel sounds are normal. NT. No HSM  Musculoskeletal: Normal range of motion. Exhibits no edema Lymphadenopathy: Has no other cervical adenopathy.  Neurological: Pt is alert and oriented to person, place, and time. Pt has normal reflexes. No cranial nerve deficit.  Motor grossly intact, Gait intact Skin: Skin is warm and dry. No rash noted or new ulcerations Psychiatric:  Has normal mood and affect. Behavior is normal without agitation No other exam findings Lab Results  Component Value Date   WBC 8.8 10/21/2018   HGB 14.7 10/21/2018   HCT 44.4 10/21/2018   PLT 277.0 10/21/2018   GLUCOSE 98 10/21/2018   CHOL 191 10/21/2018   TRIG 109.0 10/21/2018   HDL 60.40 10/21/2018   LDLDIRECT 164.5 09/19/2012   LDLCALC 109 (H) 10/21/2018   ALT 20 10/21/2018   AST 17 10/21/2018   NA 139 10/21/2018   K 3.8 10/21/2018   CL 102 10/21/2018   CREATININE 0.86 10/21/2018   BUN 9 10/21/2018   CO2 29 10/21/2018   TSH 1.85 10/21/2018       Assessment & Plan:

## 2018-10-21 NOTE — Assessment & Plan Note (Signed)
stable overall by history and exam, recent data reviewed with pt, and pt to continue medical treatment as before,  to f/u any worsening symptoms or concerns  

## 2018-10-24 ENCOUNTER — Other Ambulatory Visit: Payer: Self-pay | Admitting: *Deleted

## 2018-10-24 ENCOUNTER — Other Ambulatory Visit: Payer: Self-pay | Admitting: Neurology

## 2018-10-24 ENCOUNTER — Telehealth: Payer: Self-pay

## 2018-10-24 MED ORDER — SUMATRIPTAN SUCCINATE 6 MG/0.5ML ~~LOC~~ SOLN: SUBCUTANEOUS | 0 refills | Status: DC

## 2018-10-24 MED ORDER — TIZANIDINE HCL 4 MG PO CAPS: 4.0000 mg | ORAL_CAPSULE | Freq: Three times a day (TID) | ORAL | 0 refills | Status: DC | PRN

## 2018-10-24 MED ORDER — INDOMETHACIN 25 MG PO CAPS: ORAL_CAPSULE | ORAL | 0 refills | Status: DC

## 2018-10-24 NOTE — Telephone Encounter (Signed)
Patient called stating that she has been trying to get the pharmacy to refill these prescriptions for 3 weeks with no luck. She's needing indomethacin (INDOCIN) 25 MG capsule, SUMAtriptan (IMITREX) 6 MG/0.5ML SOLN injection and tiZANidine (ZANAFLEX) 4 MG capsule. [id number] [place]

## 2018-10-24 NOTE — Telephone Encounter (Addendum)
I have not received any prescription refills for this patient from the pharmacy.  This is the first phone call we have received from her to request the refills.  We are happy to send them to the pharmacy.  I was able to speak with the patient.  I let her know to call us anytime she has a question about prolonged refills at the pharmacy.  We do not wish for her to be out of her rescue medications.  She was appreciative.   In order for Dr. Krista Blue to continue safely prescribing medications, she will need to see the patient yearly.  The patient schedule an appt with Butler Denmark, NP on 11/29/2018.

## 2018-10-24 NOTE — Addendum Note (Signed)
Addended by: Noberto Retort C on: 10/24/2018 12:10 PM   Modules accepted: Orders

## 2018-10-29 ENCOUNTER — Other Ambulatory Visit: Payer: Self-pay | Admitting: *Deleted

## 2018-10-29 ENCOUNTER — Telehealth: Payer: Self-pay | Admitting: Neurology

## 2018-10-29 MED ORDER — TIZANIDINE HCL 4 MG PO TABS: ORAL_TABLET | ORAL | 0 refills | Status: DC

## 2018-10-29 MED ORDER — SUMATRIPTAN SUCCINATE 6 MG/0.5ML ~~LOC~~ SOLN: SUBCUTANEOUS | 0 refills | Status: DC

## 2018-10-29 NOTE — Telephone Encounter (Signed)
Pt is having difficulty getting this medication SUMAtriptan (IMITREX) 6 MG/0.5ML SOLN injection thru [facility] pharmacy [id number] Pt is asking for a call to discuss

## 2018-10-29 NOTE — Telephone Encounter (Signed)
I called [facility] to complete the quantity override case.  The amount was approved through 10/29/2019.  Case [id number].

## 2018-10-29 NOTE — Addendum Note (Signed)
Addended by: Desmond Lope on: 10/29/2018 02:39 PM   Modules accepted: Orders

## 2018-10-29 NOTE — Telephone Encounter (Signed)
[facility] ([phone][facility] to inquire about the problem.  Per the pharmacist, they are getting a "quantity exceeded" message.  The patient has not received a recent refill of her triptan so her insurance will need to be contacted.    She has coverage with [facility] [phone]) - AU#[id number].

## 2018-10-29 NOTE — Addendum Note (Signed)
Addended by: Noberto Retort C on: 10/29/2018 11:12 AM   Modules accepted: Orders

## 2018-10-29 NOTE — Telephone Encounter (Addendum)
[facility] and they were able to get her sumatriptan prescription to pay with insurance coverage, however, her copay is $284. She is being penalized for using a Management consultant.  She would like her sumatriptan and tizanidine prescriptions be sent to [facility].  In the meantime, she will use a [email or web address] coupon and pay cash at [facility] on [address].  #12 vials of sumatriptan 74m/0.5ml is 56.62 #30 tablets of tizanidine 413mtablets is 9.79.  The [facility]-day [facility] the 90-day mail order prescriptions have been sent to [facility].  She will keep her pending appt on 11/29/2018.

## 2018-10-31 ENCOUNTER — Telehealth: Payer: Self-pay | Admitting: Neurology

## 2018-10-31 NOTE — Telephone Encounter (Signed)
[name] from [facility] [employer] called stating that pt was previously on Tizanidine Capsules and now she is getting tiZANidine (ZANAFLEX) 4 MG tablet prescribed. She states it is not recommendable to switch the pt from Capsules to Tablets. Please advise.  [phone]

## 2018-10-31 NOTE — Telephone Encounter (Signed)
Spoke with pharmacist Lattie Haw) at [facility] and confirmed pt's Tizanidine rx. has been changed from capsules to tablets due to cost. OK to fill rx. now/fim
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
## 2018-11-29 ENCOUNTER — Ambulatory Visit (INDEPENDENT_AMBULATORY_CARE_PROVIDER_SITE_OTHER): Payer: 59 | Admitting: Neurology

## 2018-11-29 ENCOUNTER — Encounter: Payer: Self-pay | Admitting: Neurology

## 2018-11-29 ENCOUNTER — Other Ambulatory Visit: Payer: Self-pay

## 2018-11-29 VITALS — BP 146/100 | HR 72 | Resp 16 | Ht 66.0 in | Wt 217.0 lb

## 2018-11-29 DIAGNOSIS — G43709 Chronic migraine without aura, not intractable, without status migrainosus: Secondary | ICD-10-CM

## 2018-11-29 DIAGNOSIS — IMO0002 Reserved for concepts with insufficient information to code with codable children: Secondary | ICD-10-CM

## 2018-11-29 MED ORDER — GALCANEZUMAB-GNLM 120 MG/ML ~~LOC~~ SOAJ: 120.0000 mg | SUBCUTANEOUS | 5 refills | Status: DC

## 2018-11-29 NOTE — Progress Notes (Signed)
I have reviewed and agreed above plan. 

## 2018-12-02 ENCOUNTER — Telehealth: Payer: Self-pay | Admitting: *Deleted

## 2018-12-02 DIAGNOSIS — Z0271 Encounter for disability determination: Secondary | ICD-10-CM

## 2018-12-02 NOTE — Telephone Encounter (Signed)
PA for Emgality 132m/ml completed via CoverMyMeds. Key# [id number].  Dx: Chronic migraine (G43.709). Tried and failed meds: Topamax, Zonegran, Nortriptyline, Botox, Aimovig, Gabapentin, Lyrica, Verapamil, Effexor, Cardizem, Imitrex, Maxalt, Frova, Relpax, Amerge, Indomethacin, Flexeril, Tizanidine, Oxycodone, Tramadol. PA approved by [facility] for dates 11/02/18-12/02/19. Case ID: [id number]

## 2019-01-01 DIAGNOSIS — M25562 Pain in left knee: Secondary | ICD-10-CM

## 2019-01-01 HISTORY — DX: Pain in left knee: M25.562

## 2019-01-02 ENCOUNTER — Other Ambulatory Visit: Payer: Self-pay | Admitting: Neurology

## 2019-01-06 ENCOUNTER — Other Ambulatory Visit: Payer: Self-pay

## 2019-01-06 ENCOUNTER — Encounter (HOSPITAL_BASED_OUTPATIENT_CLINIC_OR_DEPARTMENT_OTHER): Payer: Self-pay | Admitting: Emergency Medicine

## 2019-01-06 ENCOUNTER — Emergency Department (HOSPITAL_BASED_OUTPATIENT_CLINIC_OR_DEPARTMENT_OTHER): Payer: 59

## 2019-01-06 ENCOUNTER — Emergency Department (HOSPITAL_BASED_OUTPATIENT_CLINIC_OR_DEPARTMENT_OTHER)
Admission: EM | Admit: 2019-01-06 | Discharge: 2019-01-06 | Disposition: A | Discharge status: Home / Self Care | Payer: 59 | Attending: Emergency Medicine | Admitting: Emergency Medicine

## 2019-01-06 DIAGNOSIS — Z79899 Other long term (current) drug therapy: Secondary | ICD-10-CM | POA: Diagnosis not present

## 2019-01-06 DIAGNOSIS — I10 Essential (primary) hypertension: Secondary | ICD-10-CM | POA: Diagnosis not present

## 2019-01-06 DIAGNOSIS — W01198A Fall on same level from slipping, tripping and stumbling with subsequent striking against other object, initial encounter: Secondary | ICD-10-CM | POA: Diagnosis not present

## 2019-01-06 DIAGNOSIS — Y92512 Supermarket, store or market as the place of occurrence of the external cause: Secondary | ICD-10-CM | POA: Diagnosis not present

## 2019-01-06 DIAGNOSIS — F419 Anxiety disorder, unspecified: Secondary | ICD-10-CM | POA: Insufficient documentation

## 2019-01-06 DIAGNOSIS — F329 Major depressive disorder, single episode, unspecified: Secondary | ICD-10-CM | POA: Diagnosis not present

## 2019-01-06 DIAGNOSIS — M25562 Pain in left knee: Secondary | ICD-10-CM | POA: Diagnosis not present

## 2019-01-06 MED ORDER — ACETAMINOPHEN 500 MG PO TABS
1000.0000 mg | ORAL_TABLET | Freq: Once | ORAL | Status: AC
  Administered 2019-01-06: 1000 mg via ORAL
  Filled 2019-01-06: qty 2

## 2019-01-06 NOTE — Discharge Instructions (Signed)
Your x-ray was negative for fracture or dislocation, but did show some arthritis.  Please follow-up with Dr. Barbaraann Barthel for further evaluation and treatment of your knee injury.  There could be a soft tissue injury that would need to be evaluated with an MRI, which Dr. Barbaraann Barthel would order if he felt necessary.  Use ice 3-4 times daily alternating 20 minutes on, 20 minutes off.  Take ibuprofen and Tylenol alternating 20 minutes on, 20 minutes off.  Elevate your leg whenever you are not walking on it.  Use your knee sleeve for support especially when walking.  You can weight-bear as tolerated, but use crutches for comfort.

## 2019-01-06 NOTE — ED Provider Notes (Signed)
[facility] EMERGENCY DEPARTMENT Provider Note   CSN: [id number] Arrival date & time: 01/06/19  1933    History   Chief Complaint Chief Complaint  Patient presents with  . Knee Injury    HPI Lauren Wolfe is a 43 y.o. female with history of chronic migraines who presents with left knee pain after slip and fall in [place].  Patient reports there was spill detergent on the ground and she fell.  She did not hit her head or lose consciousness.  She reports she fell straight on her knee, but did twist a little bit going down.  She denies any other injuries.  No medication taken prior to arrival.  She states she has not been able to bear weight on the limb.  She denies any neck or back pain.     HPI  Past Medical History:  Diagnosis Date  . Anxiety   . Arm vein blood clot, right   . Migraine   . Wears glasses    and contacts    Patient Active Problem List   Diagnosis Date Noted  . HLD (hyperlipidemia) 10/21/2018  . Chronic migraine 04/23/2017  . Rash 04/19/2017  . Arm DVT (deep venous thromboembolism), acute, right ([place]) 02/22/2017  . Occipital headache 07/17/2013  . Impingement syndrome of right shoulder 03/04/2013  . Preventative health care 09/24/2012  . Essential hypertension 06/16/2009  . Anxiety state 05/23/2009  . DEPRESSION 05/23/2009  . INSOMNIA-SLEEP DISORDER-UNSPEC 05/19/2009  . Migraine with intractable migraine, so stated, with status migrainosus 04/07/2009  . ALOPECIA 11/30/2008    Past Surgical History:  Procedure Laterality Date  . BREAST BIOPSY  10/31/2012   Procedure: BREAST BIOPSY WITH NEEDLE LOCALIZATION;  Surgeon: Rolm Bookbinder, MD;  Location: [facility];  Service: General;  Laterality: Right;  right breast wire guided excisional biopsy  . ganglion cysts  2011  . WRIST SURGERY Left      OB History   No obstetric history on file.      Home Medications    Prior to Admission medications   Medication Sig Start  Date End Date Taking? Authorizing Provider  Galcanezumab-gnlm (EMGALITY) 120 MG/ML SOAJ Inject 120 mg into the skin every 30 (thirty) days. 11/29/18   Suzzanne Cloud, NP  indomethacin (INDOCIN) 25 MG capsule TAKE ONE CAPSULE BY MOUTH 3 TIMES A DAY AS NEEDED MUST LAST 30 DAYS 10/24/18   Marcial Pacas, MD  levonorgestrel (MIRENA) 20 MCG/24HR IUD 1 each by Intrauterine route once.    [provider]  ondansetron (ZOFRAN-ODT) 8 MG disintegrating tablet TAKE 1 TABLET (8 MG TOTAL) BY MOUTH EVERY 8 (EIGHT) HOURS AS NEEDED FOR NAUSEA OR VOMITING. 08/13/18   Marcial Pacas, MD  SUMAtriptan (IMITREX) 6 MG/0.5ML SOLN injection One injection at onset of migraine.  May repeat in 2 hrs, if needed.  Max dose: 2 inj/day. This is a 30 day prescription. 10/29/18   Marcial Pacas, MD  tiZANidine (ZANAFLEX) 4 MG tablet TAKE ONE TABLET BY MOUTH THREE TIMES A DAY AS NEEDED FOR MIGRAINES FOR 30 DAYS 01/06/19   Marcial Pacas, MD  triamcinolone cream (KENALOG) 0.1 % Apply 1 application topically 2 (two) times daily. 10/21/18 10/21/19  Biagio Borg, MD    Family History Family History  Problem Relation Age of Onset  . Hypertension Mother   . Aneurysm Mother   . Stomach cancer Father   . Other Other        grandparent with DJD  . Hypertension Other  grandparent  . Diabetes Other        grandparent  . Gout Other        grandfather  . Breast cancer Other        grandmother  . Migraines Paternal Aunt   . Breast cancer Cousin     Social History Social History   Tobacco Use  . Smoking status: Never Smoker  . Smokeless tobacco: Never Used  Substance Use Topics  . Alcohol use: No  . Drug use: No     Allergies   Azithromycin; Codeine; Hydrocodone; Ketorolac tromethamine; Prednisone; and Tramadol   Review of Systems Review of Systems  Musculoskeletal: Positive for arthralgias and joint swelling. Negative for back pain and neck pain.  Neurological: Negative for syncope and numbness.     Physical Exam  Updated Vital Signs BP (!) 142/110 (BP Location: Right Arm)   Pulse 84   Temp 98 F (36.7 C) (Oral)   Resp 20   Ht 5' 6"  (1.676 m)   Wt 98.4 kg   LMP  (LMP Unknown)   SpO2 100%   BMI 35.02 kg/m   Physical Exam Vitals signs and nursing note reviewed.  Constitutional:      General: She is not in acute distress.    Appearance: She is well-developed. She is not diaphoretic.  HENT:     Head: Normocephalic and atraumatic.     Mouth/Throat:     Pharynx: No oropharyngeal exudate.  Eyes:     General: No scleral icterus.       Right eye: No discharge.        Left eye: No discharge.     Conjunctiva/sclera: Conjunctivae normal.     Pupils: Pupils are equal, round, and reactive to light.  Neck:     Musculoskeletal: Normal range of motion and neck supple.     Thyroid: No thyromegaly.  Cardiovascular:     Rate and Rhythm: Normal rate and regular rhythm.     Heart sounds: Normal heart sounds. No murmur. No friction rub. No gallop.   Pulmonary:     Effort: Pulmonary effort is normal. No respiratory distress.     Breath sounds: Normal breath sounds. No stridor. No wheezing or rales.  Musculoskeletal:     Comments: L knee: Mild edema noted to left knee, anterior and medial tenderness mostly; negative anterior/posterior drawer, some pain with varus and valgus stress, but no laxity, pain with medial McMurray's test; no ecchymosis noted  Lymphadenopathy:     Cervical: No cervical adenopathy.  Skin:    General: Skin is warm and dry.     Coloration: Skin is not pale.     Findings: No rash.  Neurological:     Mental Status: She is alert.     Coordination: Coordination normal.      ED Treatments / Results  Labs (all labs ordered are listed, but only abnormal results are displayed) Labs Reviewed - No data to display  EKG None  Radiology Dg Knee Complete 4 Views Left  Result Date: 01/06/2019 CLINICAL DATA:  Slipped on spilled liquid at [place], fall onto left knee. Unable to bear  weight. EXAM: LEFT KNEE - COMPLETE 4+ VIEW COMPARISON:  None. FINDINGS: No evidence of fracture, dislocation, or joint effusion. Mild medial tibiofemoral peripheral spurring. The alignment and joint spaces are maintained. Soft tissues are unremarkable. IMPRESSION: 1. No fracture or subluxation of the left knee. 2. 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 Electronically Signed   By: Threasa Beards  Sanford M.D.   On: 01/06/2019 20:40    Procedures Procedures (including critical care time)  Medications Ordered in ED Medications  acetaminophen (TYLENOL) tablet 1,000 mg (1,000 mg Oral Given 01/06/19 2001)     Initial Impression / Assessment and Plan / ED Course  I have reviewed the triage vital signs and the nursing notes.  Pertinent labs & imaging results that were available during my care of the patient were reviewed by me and considered in my medical decision making (see chart for details).        Patient with knee pain after fall.  X-ray of the left knee is negative for fracture or subluxation, but there is early medial compartment arthritis with peripheral osteophytes.  Patient has pain with weightbearing so crutches were given as well as knee sleeve.  Will give follow-up to sports medicine for further evaluation as soft tissue injury is possible.  NSAIDs and Tylenol discussed, as well as ice and elevation.  Patient understands and agrees with plan.  Patient vital stable throughout ED course and discharged in satisfactory condition.  Final Clinical Impressions(s) / ED Diagnoses   Final diagnoses:  Acute pain of left knee    ED Discharge Orders    None       Frederica Kuster, PA-C 01/06/19 2051    Lennice Sites, DO 01/06/19 2108

## 2019-01-06 NOTE — ED Triage Notes (Signed)
Reports fall at the store with injury to left knee.

## 2019-01-13 ENCOUNTER — Encounter: Payer: Self-pay | Admitting: Family Medicine

## 2019-01-13 ENCOUNTER — Ambulatory Visit (INDEPENDENT_AMBULATORY_CARE_PROVIDER_SITE_OTHER): Payer: 59 | Admitting: Family Medicine

## 2019-01-13 ENCOUNTER — Other Ambulatory Visit: Payer: Self-pay

## 2019-01-13 VITALS — BP 148/101 | HR 77 | Temp 98.0°F | Ht 66.0 in | Wt 215.0 lb

## 2019-01-13 DIAGNOSIS — M25562 Pain in left knee: Secondary | ICD-10-CM

## 2019-01-13 DIAGNOSIS — M25572 Pain in left ankle and joints of left foot: Secondary | ICD-10-CM | POA: Diagnosis not present

## 2019-01-13 DIAGNOSIS — M25552 Pain in left hip: Secondary | ICD-10-CM | POA: Diagnosis not present

## 2019-01-13 MED ORDER — IBUPROFEN 600 MG PO TABS: 600.0000 mg | ORAL_TABLET | Freq: Three times a day (TID) | ORAL | 1 refills | Status: DC

## 2019-01-13 NOTE — Progress Notes (Signed)
PCP: Biagio Borg, MD  Subjective:   HPI: Patient is a 43 y.o. female here for left thigh/knee/ankle pain.  Patient states that on 01/06/2019, she was at [facility] where she slipped on some detergent.  She attempted to stand up and fell a second time.  She states that both times she fell and struck her anterior left knee on the floor.  She was later seen at the emergency department where x-rays were performed.  She was discharged with a knee compression sleeve.  Her knee was significantly painful for several days following the fall.  After a few days she began working from home.  After being seated for about 4 hours while working she stood and developed pain in the anterior left hip and anterior left ankle.  She denies any hip or ankle pain immediately at the time of the fall.  Today overall, her pain is 6/10.  Her knee pain is the worst.  This is increased with weightbearing or walking.  Her hip and ankle is only painful when walking after she has been seated for a while.  She denies any numbness or tingling.  She occasionally feels some swelling diffusely in the leg.  She denies any erythema.  No skin changes. She denies any locking of the knee.  Past Medical History:  Diagnosis Date  . Anxiety   . Arm vein blood clot, right   . Migraine   . Wears glasses    and contacts    Current Outpatient Medications on File Prior to Visit  Medication Sig Dispense Refill  . Galcanezumab-gnlm (EMGALITY) 120 MG/ML SOAJ Inject 120 mg into the skin every 30 (thirty) days. 1 pen 5  . indomethacin (INDOCIN) 25 MG capsule TAKE ONE CAPSULE BY MOUTH 3 TIMES A DAY AS NEEDED MUST LAST 30 DAYS 30 capsule 0  . levonorgestrel (MIRENA) 20 MCG/24HR IUD 1 each by Intrauterine route once.    . ondansetron (ZOFRAN-ODT) 8 MG disintegrating tablet TAKE 1 TABLET (8 MG TOTAL) BY MOUTH EVERY 8 (EIGHT) HOURS AS NEEDED FOR NAUSEA OR VOMITING. 20 tablet 2  . SUMAtriptan (IMITREX) 6 MG/0.5ML SOLN injection One injection at onset of  migraine.  May repeat in 2 hrs, if needed.  Max dose: 2 inj/day. This is a 30 day prescription. 12 vial 0  . tiZANidine (ZANAFLEX) 4 MG tablet TAKE ONE TABLET BY MOUTH THREE TIMES A DAY AS NEEDED FOR MIGRAINES FOR 30 DAYS 30 tablet 0  . triamcinolone cream (KENALOG) 0.1 % Apply 1 application topically 2 (two) times daily. 30 g 2   No current facility-administered medications on file prior to visit.     Past Surgical History:  Procedure Laterality Date  . BREAST BIOPSY  10/31/2012   Procedure: BREAST BIOPSY WITH NEEDLE LOCALIZATION;  Surgeon: Rolm Bookbinder, MD;  Location: [place];  Service: General;  Laterality: Right;  right breast wire guided excisional biopsy  . ganglion cysts  2011  . WRIST SURGERY Left     Allergies  Allergen Reactions  . Azithromycin Shortness Of Breath    REACTION: rash, prurutis  . Codeine Shortness Of Breath    Causes shortness of breath, rash, itching  . Hydrocodone Shortness Of Breath    Rash, itching  . Ketorolac Tromethamine Shortness Of Breath    Itching, rash  . Prednisone     SOB  . Tramadol     itching    Social History   Socioeconomic History  . Marital status: Single  Spouse name: Not on file  . Number of children: 1  . Years of education: some college  . Highest education level: Not on file  Occupational History  . Occupation: cust service rep w/ [employer]  . Financial resource strain: Not on file  . Food insecurity:    Worry: Not on file    Inability: Not on file  . Transportation needs:    Medical: Not on file    Non-medical: Not on file  Tobacco Use  . Smoking status: Never Smoker  . Smokeless tobacco: Never Used  Substance and Sexual Activity  . Alcohol use: No  . Drug use: No  . Sexual activity: Yes    Birth control/protection: Pill  Lifestyle  . Physical activity:    Days per week: Not on file    Minutes per session: Not on file  . Stress: Not on file  Relationships  . Social  connections:    Talks on phone: Not on file    Gets together: Not on file    Attends religious service: Not on file    Active member of club or organization: Not on file    Attends meetings of clubs or organizations: Not on file    Relationship status: Not on file  . Intimate partner violence:    Fear of current or ex partner: Not on file    Emotionally abused: Not on file    Physically abused: Not on file    Forced sexual activity: Not on file  Other Topics Concern  . Not on file  Social History Narrative   Lives at home with her daughter.   Right-handed.   No caffeine use.    Family History  Problem Relation Age of Onset  . Hypertension Mother   . Aneurysm Mother   . Stomach cancer Father   . Other Other        grandparent with DJD  . Hypertension Other        grandparent  . Diabetes Other        grandparent  . Gout Other        grandfather  . Breast cancer Other        grandmother  . Migraines Paternal Aunt   . Breast cancer Cousin     Temp 98 F (36.7 C) (Oral)   Ht 5' 6"  (1.676 m)   Wt 215 lb (97.5 kg)   LMP  (LMP Unknown)   BMI 34.70 kg/m   Review of Systems: See HPI above.     Objective:  Physical Exam:  Gen: awake, alert, NAD, comfortable in exam room Pulm: breathing unlabored  Left hip:  - Inspection: No gross deformity, no swelling, erythema, or ecchymosis - Palpation: TTP anteriorly over the proximal rectus as well the hip flexor. TTP also over GT - ROM: Normal range of motion on Flexion abduction, internal and external rotation. No pain with passive IR/ER - Strength: Normal strength but pain with resisted hip flexion - Neuro/vasc: NV intact distally - Special Tests: Negative FABER and FADIR.  Negative logroll.  Right hip: No deformity Mild TTP over the GT Full ROM, 5/5 strength  Left Knee: - Inspection: No gross deformity. No effusion. No erythema or bruising. Skin intact - Palpation: TTP over the patella and medial and lateral joint  lines - ROM: full active ROM with flexion and extension in knee - Strength: 4+/5 strength in extension - Neuro/vasc: NV intact - Special Tests: - LIGAMENTS:  negative  Lachman's, no MCL or LCL laxity  -- MENISCUS: medial pain with McMurray's  Right knee: No deformity or swelling No TTP Full ROM  Left ankle: - Inspection: No obvious deformity, erythema, swelling, or ecchymosis - Palpation: TTP over the anterior medial ankle, Negative [place] ankle exam - Strength: Normal strength with dorsiflexion, plantarflexion, inversion, and eversion - ROM: Full ROM pain anteriorly with end range plantar flexion - Neuro/vasc: NV intact - Special Tests: Negative anterior drawer. Negative syndesmotic compression.  Right ankle: No deformity or swelling Full ROM NV intact   Assessment & Plan:  1. Left knee pain 2/2 contusion. Small possibility of concomitant meniscal injury. Nonetheless will manage conservatively - Continue Tylenol and ibuprofen as needed - Ice - Continue knee brace for support - Patient instructed on home strengthening exercises - Follow-up in 6 weeks as needed  2. Left hip pain likely secondary to strain of hip flexors and rectus femoris.  Expect complete resolution with conservative management and anti-inflammatories as well as gentle stretching exercises.  3. Left ankle pain-no concern for bony injury.  Likely mild synovitis.  Also expect complete resolution with anti-inflammatories and rest.

## 2019-01-13 NOTE — Patient Instructions (Addendum)
Your exam is reassuring. You have a knee contusion, less likely a small meniscus tear that should heal with conservative treatment. Ice 15 minutes at a time 3-4 times a day. Ibuprofen 613m three times a day with food for pain and inflammation. Knee brace when up and walking around. Straight leg raises, knee extensions 3 sets of 10 once a day.  Your hip pain is due to hip flexor and quad strains. Icing, ibuprofen as noted above. Standing hip rotations 3 sets of 10 once a day.  Your ankle pain is due to a synovitis. This should resolve just with the ibuprofen and icing. Follow up with me in 1 month for these issues.

## 2019-01-27 ENCOUNTER — Ambulatory Visit: Payer: 59 | Admitting: Family Medicine

## 2019-02-05 ENCOUNTER — Ambulatory Visit (HOSPITAL_BASED_OUTPATIENT_CLINIC_OR_DEPARTMENT_OTHER)
Admission: RE | Admit: 2019-02-05 | Discharge: 2019-02-05 | Disposition: A | Discharge status: Home / Self Care | Payer: 59 | Source: Ambulatory Visit | Attending: Family Medicine | Admitting: Family Medicine

## 2019-02-05 ENCOUNTER — Ambulatory Visit (INDEPENDENT_AMBULATORY_CARE_PROVIDER_SITE_OTHER): Payer: 59 | Admitting: Family Medicine

## 2019-02-05 ENCOUNTER — Other Ambulatory Visit: Payer: Self-pay

## 2019-02-05 ENCOUNTER — Encounter: Payer: Self-pay | Admitting: Family Medicine

## 2019-02-05 VITALS — BP 120/80 | HR 87 | Ht 66.0 in | Wt 217.0 lb

## 2019-02-05 DIAGNOSIS — M25552 Pain in left hip: Secondary | ICD-10-CM | POA: Insufficient documentation

## 2019-02-05 DIAGNOSIS — M25562 Pain in left knee: Secondary | ICD-10-CM | POA: Diagnosis not present

## 2019-02-05 NOTE — Patient Instructions (Addendum)
Get x-rays of your hip downstairs as you leave today - we will call you with the results. We will go ahead with an MRI of your left knee to assess for a meniscus tear. Start physical therapy for both issues. Ibuprofen 658m three times a day with food for pain and inflammation as needed. Follow up will depend on the MRI results but will likely be in 5-6 weeks.

## 2019-02-05 NOTE — Progress Notes (Signed)
PCP: Biagio Borg, MD  Subjective:   HPI: Patient is a 43 y.o. female here for left thigh/knee/ankle pain.  4/13: Patient states that on 01/06/2019, she was at [facility] where she slipped on some detergent.  She attempted to stand up and fell a second time.  She states that both times she fell and struck her anterior left knee on the floor.  She was later seen at the emergency department where x-rays were performed.  She was discharged with a knee compression sleeve.  Her knee was significantly painful for several days following the fall.  After a few days she began working from home.  After being seated for about 4 hours while working she stood and developed pain in the anterior left hip and anterior left ankle.  She denies any hip or ankle pain immediately at the time of the fall.  Today overall, her pain is 6/10.  Her knee pain is the worst.  This is increased with weightbearing or walking.  Her hip and ankle is only painful when walking after she has been seated for a while.  She denies any numbness or tingling.  She occasionally feels some swelling diffusely in the leg.  She denies any erythema.  No skin changes. She denies any locking of the knee.  5/6: Patient returns today for follow-up for left knee/thigh pain.  Patient reports no notable improvement in pain and continues to have 5/10 pain with movement and most activity.  She reports continued pain in the anterior hip which is worse with flexion.  No radiation to the groin.  No distal numbness or tingling in the lower extremity.  Her knee pain is also located over the medial lateral anterior aspect of the knee.  She does feel that she has occasional swelling in the knee.  She notes increased knee pain with walking more.  She denies any locking the knee or giving out.  She was previously wearing a knee brace which caused more pain than it relieved so she has stopped wearing this.  She has been taking ibuprofen which is not helpful.  She also states  she has been doing the home exercises that she was previously instructed on.  She denies any new injuries.  No associated skin changes.  No numbness or tingling.  Past Medical History:  Diagnosis Date  . Anxiety   . Arm vein blood clot, right   . Migraine   . Wears glasses    and contacts    Current Outpatient Medications on File Prior to Visit  Medication Sig Dispense Refill  . Galcanezumab-gnlm (EMGALITY) 120 MG/ML SOAJ Inject 120 mg into the skin every 30 (thirty) days. 1 pen 5  . ibuprofen (ADVIL,MOTRIN) 600 MG tablet Take 1 tablet (600 mg total) by mouth every 8 (eight) hours. 90 tablet 1  . indomethacin (INDOCIN) 25 MG capsule TAKE ONE CAPSULE BY MOUTH 3 TIMES A DAY AS NEEDED MUST LAST 30 DAYS 30 capsule 0  . levonorgestrel (MIRENA) 20 MCG/24HR IUD 1 each by Intrauterine route once.    . ondansetron (ZOFRAN-ODT) 8 MG disintegrating tablet TAKE 1 TABLET (8 MG TOTAL) BY MOUTH EVERY 8 (EIGHT) HOURS AS NEEDED FOR NAUSEA OR VOMITING. 20 tablet 2  . SUMAtriptan (IMITREX) 6 MG/0.5ML SOLN injection One injection at onset of migraine.  May repeat in 2 hrs, if needed.  Max dose: 2 inj/day. This is a 30 day prescription. 12 vial 0  . tiZANidine (ZANAFLEX) 4 MG tablet TAKE ONE TABLET BY  MOUTH THREE TIMES A DAY AS NEEDED FOR MIGRAINES FOR 30 DAYS 30 tablet 0  . triamcinolone cream (KENALOG) 0.1 % Apply 1 application topically 2 (two) times daily. 30 g 2   No current facility-administered medications on file prior to visit.     Past Surgical History:  Procedure Laterality Date  . BREAST BIOPSY  10/31/2012   Procedure: BREAST BIOPSY WITH NEEDLE LOCALIZATION;  Surgeon: Rolm Bookbinder, MD;  Location: [place];  Service: General;  Laterality: Right;  right breast wire guided excisional biopsy  . ganglion cysts  2011  . WRIST SURGERY Left     Allergies  Allergen Reactions  . Azithromycin Shortness Of Breath    REACTION: rash, prurutis  . Codeine Shortness Of Breath     Causes shortness of breath, rash, itching  . Hydrocodone Shortness Of Breath    Rash, itching  . Ketorolac Tromethamine Shortness Of Breath    Itching, rash  . Prednisone     SOB  . Tramadol     itching    Social History   Socioeconomic History  . Marital status: Single    Spouse name: Not on file  . Number of children: 1  . Years of education: some college  . Highest education level: Not on file  Occupational History  . 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 Financial resource strain: Not on file  . Food insecurity:    Worry: Not on file    Inability: Not on file  . Transportation needs:    Medical: Not on file    Non-medical: Not on file  Tobacco Use  . Smoking status: Never Smoker  . Smokeless tobacco: Never Used  Substance and Sexual Activity  . Alcohol use: No  . Drug use: No  . Sexual activity: Yes    Birth control/protection: Pill  Lifestyle  . Physical activity:    Days per week: Not on file    Minutes per session: Not on file  . Stress: Not on file  Relationships  . Social connections:    Talks on phone: Not on file    Gets together: Not on file    Attends religious service: Not on file    Active member of club or organization: Not on file    Attends meetings of clubs or organizations: Not on file    Relationship status: Not on file  . Intimate partner violence:    Fear of current or ex partner: Not on file    Emotionally abused: Not on file    Physically abused: Not on file    Forced sexual activity: Not on file  Other Topics Concern  . Not on file  Social History Narrative   Lives at home with her daughter.   Right-handed.   No caffeine use.    Family History  Problem Relation Age of Onset  . Hypertension Mother   . Aneurysm Mother   . Stomach cancer Father   . Other Other        grandparent with DJD  . Hypertension Other        grandparent  . Diabetes Other        grandparent  . Gout Other        grandfather  .  Breast cancer Other        grandmother  . Migraines Paternal Aunt   . Breast cancer Cousin     LMP  (LMP Unknown)   Review of  Systems: See HPI above.     Objective:  Physical Exam:  GEN: Awake, alert, no acute stress Pulmonary: Breathing unlabored  Left hip:  - Inspection: No gross deformity, no swelling, erythema, or ecchymosis - Palpation: Tenderness over the anterior hip over the proximal quadriceps and hip flexors.  She also has some tenderness over the greater trochanter laterally - ROM: Normal range of motion on Flexion abduction, internal and external rotation without pain - Strength: Normal strength but pain noted with resisted hip flexion. - Neuro/vasc: NV intact distally - Special Tests: Anterior pain with FABER and FADIR.  Negative logroll.   Right hip: No deformity No tenderness. Full ROM with 5/5 strength NVI distally.  Left knee: - Inspection: No gross deformity. No effusion. No erythema or bruising. Skin intact - Palpation: Tenderness along joint line both laterally and medially.  She also has some tenderness directly over the patellar tendon.  No tenderness over the patella itself or posterior knee - ROM: full active ROM with flexion and extension in knee - Strength: 5/5 strength.  Minimal pain with resisted knee extension - Neuro/vasc: NV intact - Special Tests: - LIGAMENTS: negative Lachman's, no MCL or LCL laxity  -- MENISCUS: Pain with McMurray's  Right knee:  No obvious deformity or effusion Full ROM with 5/5 strength   Assessment & Plan:  1.  Left knee pain-no improvement with conservative management.  Her knee continues to be stable on exam - We will further evaluate with MRI - Referral to formal physical therapy  2.  Left hip pain continues to remain more likely due to strain of hip flexors.  She has not had good resolution with home therapy - will refer to formal physical therapy for this as well - will xray right hip since was not  previously done

## 2019-02-06 DIAGNOSIS — Z0271 Encounter for disability determination: Secondary | ICD-10-CM

## 2019-02-10 ENCOUNTER — Ambulatory Visit: Payer: 59 | Admitting: Family Medicine

## 2019-02-10 ENCOUNTER — Encounter: Payer: Self-pay | Admitting: Family Medicine

## 2019-02-17 ENCOUNTER — Other Ambulatory Visit: Payer: Self-pay

## 2019-02-17 ENCOUNTER — Ambulatory Visit
Admission: RE | Admit: 2019-02-17 | Discharge: 2019-02-17 | Disposition: A | Discharge status: Home / Self Care | Payer: 59 | Source: Ambulatory Visit | Attending: Family Medicine | Admitting: Family Medicine

## 2019-02-17 ENCOUNTER — Ambulatory Visit: Payer: 59 | Admitting: Physical Therapy

## 2019-02-17 DIAGNOSIS — M25562 Pain in left knee: Secondary | ICD-10-CM

## 2019-02-18 ENCOUNTER — Encounter: Payer: Self-pay | Admitting: Physical Therapy

## 2019-02-18 ENCOUNTER — Ambulatory Visit: Payer: 59 | Attending: Family Medicine | Admitting: Physical Therapy

## 2019-02-18 ENCOUNTER — Other Ambulatory Visit: Payer: Self-pay

## 2019-02-18 DIAGNOSIS — R29898 Other symptoms and signs involving the musculoskeletal system: Secondary | ICD-10-CM

## 2019-02-18 DIAGNOSIS — R262 Difficulty in walking, not elsewhere classified: Secondary | ICD-10-CM

## 2019-02-18 DIAGNOSIS — M25552 Pain in left hip: Secondary | ICD-10-CM | POA: Diagnosis present

## 2019-02-18 DIAGNOSIS — M25562 Pain in left knee: Secondary | ICD-10-CM | POA: Insufficient documentation

## 2019-02-18 NOTE — Therapy (Signed)
[facility] [address]  [place], [place], [address] Phone: [phone]   Fax:  [phone]  Physical Therapy Evaluation  Patient Details  Name: Lauren Wolfe MRN: [id number] Date of Birth: [birth year] Referring Provider (PT): Karlton Lemon, MD   Encounter Date: 02/18/2019  PT End of Session - 02/18/19 1644    Visit Number  1    Number of Visits  7    Date for PT Re-Evaluation  04/01/19    Authorization Type  [employer]    PT Start Time  9450    PT Stop Time  1636    PT Time Calculation (min)  41 min    Activity Tolerance  Patient tolerated treatment well;Patient limited by pain    Behavior During Therapy  [facility] for tasks assessed/performed       Past Medical History:  Diagnosis Date  . Anxiety   . Arm vein blood clot, right   . Migraine   . Wears glasses    and contacts    Past Surgical History:  Procedure Laterality Date  . BREAST BIOPSY  10/31/2012   Procedure: BREAST BIOPSY WITH NEEDLE LOCALIZATION;  Surgeon: Rolm Bookbinder, MD;  Location: [place] [place];  Service: General;  Laterality: Right;  right breast wire guided excisional biopsy  . ganglion cysts  2011  . WRIST SURGERY Left     There were no vitals filed for this visit.   Subjective Assessment - 02/18/19 1556    Subjective  Patient reports that on January 06, 2019 she had a fall at [facility] from slipping on laundry detergent. Has had L knee and hip pain since then, and the pain has been worsening. Knee pain is located below the knee cap. L hip pain is located anteriorly over the front of the hip.  Knee & hip pain are both worse with standing up after prolonged sitting, stairs, prolonged walking. Was previously walking 5-7 miles/day and would like to get back to this. Denies N/T. Better with heat, elevation, AND ibuprofen.    Pertinent History  migraine, anxiety, L wrist surgery    Limitations  Sitting;Standing;Walking;House hold  activities    How long can you sit comfortably?  1 hour    How long can you stand comfortably?  atleast 30 min    How long can you walk comfortably?  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 Patellar partial-thickness cartilage loss. Focal abnormal edema in Hoffa's fat pad    Patient Stated Goals  "I just want to be able to get back to walking"    Currently in Pain?  Yes    Pain Score  5     Pain Location  Knee    Pain Orientation  Left    Pain Descriptors / Indicators  Aching    Pain Type  Acute pain         [facility] PT Assessment - 02/18/19 1604      Assessment   Medical Diagnosis  Acute pain of L knee    Referring Provider (PT)  Karlton Lemon, MD    Onset Date/Surgical Date  01/06/19    Next MD Visit  not scheduled    Prior Therapy  Yes      Precautions   Precautions  None  Restrictions   Weight Bearing Restrictions  No      Balance Screen   Has the patient fallen in the past 6 months  Yes    How many times?  1    Has the patient had a decrease in activity level because of a fear of falling?   No    Is the patient reluctant to leave their home because of a fear of falling?   No      Home Environment   Living Environment  Private residence    Type of Home  Apartment    Home Access  Stairs to enter    Entrance Stairs-Number of Steps  14    Entrance Stairs-Rails  Right;Left    Home Layout  One level    [place]      Prior Function   Level of Independence  Independent    Vocation  Full time employment    Vocation Requirements  sitting & computer work    Leisure  walking       Cognition   Overall Cognitive Status  Within Functional Limits for tasks assessed      Sensation   Light Touch  Appears Intact      Coordination   Gross Motor Movements are Fluid  and Coordinated  Yes      Posture/Postural Control   Posture/Postural Control  Postural limitations    Postural Limitations  Rounded Shoulders;Weight shift right      ROM / Strength   AROM / PROM / Strength  AROM;PROM;Strength      AROM   AROM Assessment Site  Hip;Knee    Right/Left Hip  Left;Right    Right Hip Extension  --    Right Hip Flexion  109    Right Hip External Rotation   43    Right Hip Internal Rotation   26    Right Hip ABduction  20    Left Hip Flexion  95   mild pain in knee   Left Hip External Rotation   40   mild pain in anterior hip   Left Hip Internal Rotation   26    Left Hip ABduction  22    Right/Left Knee  Left;Right    Right Knee Extension  0    Right Knee Flexion  135    Left Knee Extension  3    Left Knee Flexion  125      PROM   PROM Assessment Site  Knee    Right/Left Knee  Left;Right    Right Knee Extension  0    Right Knee Flexion  137    Left Knee Extension  1    Left Knee Flexion  129   mild pain in L hip     Strength   Strength Assessment Site  Hip;Knee;Ankle    Right/Left Hip  Right;Left    Right Hip Flexion  4+/5    Right Hip ABduction  4+/5    Right Hip ADduction  4/5    Left Hip Flexion  4+/5    Left Hip ABduction  4+/5    Left Hip ADduction  4/5    Right/Left Knee  Right;Left    Right Knee Flexion  4+/5    Right Knee Extension  4+/5    Left Knee Flexion  4+/5    Left Knee Extension  4+/5   pain in anterior knee   Right/Left Ankle  Right;Left    Right Ankle  Dorsiflexion  4+/5    Right Ankle Plantar Flexion  5/5    Left Ankle Dorsiflexion  4+/5    Left Ankle Plantar Flexion  5/5   pain in anterior knee     Flexibility   Soft Tissue Assessment /Muscle Length  yes    Quadriceps  L moderately tight quad/hip flexor in mod thomas    ITB  moderate tightness on L w/ Ober's test      Palpation   Palpation comment  L infrapatellar pain to palpation as well as medial patella; increased tone in L calf; TTP and increased tone  over L proximal quad/hip flexor/TFL      Ambulation/Gait   Gait Pattern  Within Functional Limits    Ambulation Surface  Level;Indoor    Gait velocity  normal                Objective measurements completed on examination: See above findings.              PT Education - 02/18/19 1644    Education Details  prognosis, POC, HEP    Person(s) Educated  Patient    Methods  Explanation;Demonstration;Tactile cues;Verbal cues;Handout    Comprehension  Verbalized understanding;Returned demonstration       PT Short Term Goals - 02/18/19 1652      PT SHORT TERM GOAL #1   Title  Patient to be independent with initial HEP    Time  3    Period  Weeks    Status  New    Target Date  03/11/19        PT Long Term Goals - 02/18/19 1653      PT LONG TERM GOAL #1   Title  Patient to be independent with advanced HEP     Time  6    Period  Weeks    Status  New    Target Date  04/01/19      PT LONG TERM GOAL #2   Title  Patient to demonstrate L hip AROM WFL and without pain limiting.     Time  6    Period  Weeks    Status  New    Target Date  04/01/19      PT LONG TERM GOAL #3   Title  Patient to demonstrate L knee AROM/PROM [facility] and without pain limiting.     Time  6    Period  Weeks    Status  New    Target Date  04/01/19      PT LONG TERM GOAL #4   Title  Patient to demonstrate stair climbing up/down 13 steps with 1 handrail as needed with good stability and no c/o pain.    Time  6    Period  Weeks    Status  New    Target Date  04/01/19      PT LONG TERM GOAL #5   Title  Patient to return to walking 5 miles without pain limiting.     Time  6    Period  Weeks    Status  New    Target Date  04/01/19             Plan - 02/18/19 1645    Clinical Impression Statement  Patient is a 43y/o F presenting to OPPT with c/o acute L hip and knee pain after a fall at [facility]. Knee pain is located over L infrapatellar region- TTP here and over medial  patella. Hip painful anteriorly- TTP over hip flexor, proximal quad, and TFL. Both locations of pain worse when standing up from prolonged sitting, navigating stairs, and prolonged walking. Patient was previously walking 5-7 miles/day for exercise and would like to return to this activity. Patient today with limited and painful L knee and hip ROM, good overall strength, and decreased L hip flexor, quad, and TFL flexibility. Educated on gentle stretching and strengthening HEP- patient reported understanding. Would benefit from skilled PT services 1x/week for 6 weeks to address aforementioned impairments.      Personal Factors and Comorbidities  Age;Past/Current Experience;Comorbidity 2;Time since onset of injury/illness/exacerbation    Comorbidities  migraine, anxiety, L wrist surgery    Examination-Activity Limitations  Sit;Bend;Sleep;Carry;Squat;Stairs;Stand;Lift;Transfers;Locomotion Level    Examination-Participation Restrictions  Church;Cleaning;Community Activity;Shop;Driving;Interpersonal Relationship;Yard Work;Laundry;Meal Prep    Stability/Clinical Decision Making  Stable/Uncomplicated    Clinical Decision Making  Low    Rehab Potential  Good    PT Frequency  1x / week    PT Duration  6 weeks    PT Treatment/Interventions  ADLs/Self Care Home Management;Cryotherapy;Electrical Stimulation;Iontophoresis 85m/ml Dexamethasone;Functional mobility training;Stair training;Gait training;Ultrasound;Moist Heat;Therapeutic activities;Therapeutic exercise;Balance training;Neuromuscular re-education;Patient/family education;Orthotic Fit/Training;Passive range of motion;Manual techniques;Dry needling;Energy conservation;Taping;Vasopneumatic Device    PT Next Visit Plan  reassess HEP    Consulted and Agree with Plan of Care  Patient       Patient will benefit from skilled therapeutic intervention in order to improve the following deficits and impairments:  Decreased activity tolerance, Decreased strength,  Pain, Difficulty walking, Decreased range of motion, Postural dysfunction, Impaired flexibility  Visit Diagnosis: Acute pain of left knee  Pain in left hip  Difficulty in walking, not elsewhere classified  Other symptoms and signs involving the musculoskeletal system     Problem List Patient Active Problem List   Diagnosis Date Noted  . HLD (hyperlipidemia) 10/21/2018  . Chronic migraine 04/23/2017  . Rash 04/19/2017  . Arm DVT (deep venous thromboembolism), acute, right ([place] 02/22/2017  . Occipital headache 07/17/2013  . Impingement syndrome of right shoulder 03/04/2013  . Preventative health care 09/24/2012  . Essential hypertension 06/16/2009  . Anxiety state 05/23/2009  . DEPRESSION 05/23/2009  . INSOMNIA-SLEEP DISORDER-UNSPEC 05/19/2009  . Migraine with intractable migraine, so stated, with status migrainosus 04/07/2009  . ALOPECIA 11/30/2008    YJanene Harvey PT, DPT 02/18/19 4:56 PM    [facility] [address] [place] [address]Phone: [phone]  Fax:  [phone] Name: NEMON MIGGINSMRN: [id number] of Birth: [birth year]

## 2019-02-27 ENCOUNTER — Ambulatory Visit: Payer: 59

## 2019-02-27 ENCOUNTER — Other Ambulatory Visit: Payer: Self-pay

## 2019-02-27 DIAGNOSIS — R29898 Other symptoms and signs involving the musculoskeletal system: Secondary | ICD-10-CM

## 2019-02-27 DIAGNOSIS — R262 Difficulty in walking, not elsewhere classified: Secondary | ICD-10-CM

## 2019-02-27 DIAGNOSIS — M25552 Pain in left hip: Secondary | ICD-10-CM

## 2019-02-27 DIAGNOSIS — M25562 Pain in left knee: Secondary | ICD-10-CM

## 2019-02-27 NOTE — Therapy (Signed)
[facility] [address]  [place], [place], [address] Phone: [phone]   Fax:  [phone]  Physical Therapy Treatment  Patient Details  Name: Lauren Wolfe MRN: [id number] Date of Birth: Lauren Wolfe, Lauren Wolfe Referring Provider (PT): Karlton Lemon, MD   Encounter Date: 02/27/2019  PT End of Session - 02/27/19 1311    Visit Number  2    Number of Visits  7    Date for PT Re-Evaluation  04/01/19    Authorization Type  [employer]    PT Start Time  1306    PT Stop Time  1401    PT Time Calculation (min)  55 min    Activity Tolerance  Patient tolerated treatment well;Patient limited by pain    Behavior During Therapy  [facility] for tasks assessed/performed       Past Medical History:  Diagnosis Date  . Anxiety   . Arm vein blood clot, right   . Migraine   . Wears glasses    and contacts    Past Surgical History:  Procedure Laterality Date  . BREAST BIOPSY  10/31/2012   Procedure: BREAST BIOPSY WITH NEEDLE LOCALIZATION;  Surgeon: Rolm Bookbinder, MD;  Location: [place];  Service: General;  Laterality: Right;  right breast wire guided excisional biopsy  . ganglion cysts  2011  . WRIST SURGERY Left     There were no vitals filed for this visit.  Subjective Assessment - 02/27/19 1308    Subjective  Pt. reporting L knee pain worse last few days which she attributes to weather.      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 Patellar partial-thickness cartilage loss. Focal abnormal edema in Hoffa's fat pad    Patient Stated Goals  "I just want to be able to get back to walking"    Currently in Pain?  Yes    Pain Score  6     Pain Location  Knee    Pain Orientation  Left     Aggravating Factors   Prolonged sitting, when it gets really cold    Pain Relieving Factors  heat, ibuprofen     Effect of Pain on Daily Activities  limits walking distance     Multiple Pain Sites  No                       [facility] Adult PT Treatment/Exercise - 02/27/19 0001      Knee/Hip Exercises: Stretches   Passive Hamstring Stretch  Left;2 reps;30 seconds    Passive Hamstring Stretch Limitations  strap; 2nd set seated     Quad Stretch  Left;2 reps;30 seconds    Quad Stretch Limitations  prone with strap and bolster under anterior thigh     Hip Flexor Stretch  Left;2 reps;30 seconds    Hip Flexor Stretch Limitations  modified thomas position with strap     ITB Stretch  Left;2 reps;30 seconds   Cues provided for proper positioning to ensure proper stretc   ITB Stretch Limitations  with strap     Piriformis Stretch  Left;2 reps;30 seconds    Piriformis Stretch Limitations  KTOS      Knee/Hip Exercises: Aerobic   Nustep  Lvl 4, 6 min (  LE only)      Knee/Hip Exercises: Standing   Heel Raises  Both;15 reps    Heel Raises Limitations  at TM rail     Forward Step Up  Left;10 reps;1 set;Step Height: 4";Step Height: 6"    Forward Step Up Limitations  first few reps with 6" then switched to 4" step as pt. noting L knee pain with 6" step      Knee/Hip Exercises: Seated   Other Seated Knee/Hip Exercises  L fitter leg press (1 balck, 1 blue band) x 10 reps       Knee/Hip Exercises: Supine   Bridges  Both;10 reps    Bridges with [employer]  Both   adduction ball squeeze; x 7 then pt. requesting break      Modalities   Modalities  Vasopneumatic      Vasopneumatic   Number Minutes Vasopneumatic   10 minutes    Vasopnuematic Location   Knee    Vasopneumatic Pressure  Medium    Vasopneumatic Temperature   coldest temp.               PT Short Term Goals - 02/27/19 1312      PT SHORT TERM GOAL #1   Title  Patient to be independent with initial HEP    Time   3    Period  Weeks    Status  On-going    Target Date  03/11/19        PT Long Term Goals - 02/27/19 1313      PT LONG TERM GOAL #1   Title  Patient to be independent with advanced HEP     Time  6    Period  Weeks    Status  On-going      PT LONG TERM GOAL #2   Title  Patient to demonstrate L hip AROM WFL and without pain limiting.     Time  6    Period  Weeks    Status  On-going      PT LONG TERM GOAL #3   Title  Patient to demonstrate L knee AROM/PROM WFL and without pain limiting.     Time  6    Period  Weeks    Status  On-going      PT LONG TERM GOAL #4   Title  Patient to demonstrate stair climbing up/down 13 steps with 1 handrail as needed with good stability and no c/o pain.    Time  6    Period  Weeks    Status  On-going      PT LONG TERM GOAL #5   Title  Patient to return to walking 5 miles without pain limiting.     Time  6    Period  Weeks    Status  On-going            Plan - 02/27/19 1317    Clinical Impression Statement  Pt. reporting she was able to perform HEP daily since eval.  Only required min cueing with ITB stretch for proper positioning with review today.  Tolerated mild progression of gentle LE strengthening well today with addition of adduction bridge and 4" forward step-up.  Ended session with some remaining L knee pain thus applied ice/compression for hopeful reduction in post-exercise swelling and pain.      Personal Factors and Comorbidities  Age;Past/Current Experience;Comorbidity 2;Time since onset of injury/illness/exacerbation    Comorbidities  migraine, anxiety, L wrist surgery  Examination-Activity Limitations  Sit;Bend;Sleep;Carry;Squat;Stairs;Stand;Lift;Transfers;Locomotion Level    Examination-Participation Restrictions  Church;Cleaning;Community Activity;Shop;Driving;Interpersonal Relationship;Yard Work;Laundry;Meal Prep    PT Treatment/Interventions  ADLs/Self Care Home Management;Cryotherapy;Electrical  Stimulation;Iontophoresis 35m/ml Dexamethasone;Functional mobility training;Stair training;Gait training;Ultrasound;Moist Heat;Therapeutic activities;Therapeutic exercise;Balance training;Neuromuscular re-education;Patient/family education;Orthotic Fit/Training;Passive range of motion;Manual techniques;Dry needling;Energy conservation;Taping;Vasopneumatic Device    PT Next Visit Plan  Monitor tolerance to therex progression; gentle strengthening as tolerated; trial of K-taping as indicated; modalities as indicated    Consulted and Agree with Plan of Care  Patient       Patient will benefit from skilled therapeutic intervention in order to improve the following deficits and impairments:  Decreased activity tolerance, Decreased strength, Pain, Difficulty walking, Decreased range of motion, Postural dysfunction, Impaired flexibility  Visit Diagnosis: Acute pain of left knee  Pain in left hip  Difficulty in walking, not elsewhere classified  Other symptoms and signs involving the musculoskeletal system     Problem List Patient Active Problem List   Diagnosis Date Noted  . HLD (hyperlipidemia) 10/21/2018  . Chronic migraine 04/23/2017  . Rash 04/19/2017  . Arm DVT (deep venous thromboembolism), acute, right ([place] 02/22/2017  . Occipital headache 07/17/2013  . Impingement syndrome of right shoulder 03/04/2013  . Preventative health care 09/24/2012  . Essential hypertension 06/16/2009  . Anxiety state 05/23/2009  . DEPRESSION 05/23/2009  . INSOMNIA-SLEEP DISORDER-UNSPEC 05/19/2009  . Migraine with intractable migraine, so stated, with status migrainosus 04/07/2009  . ALOPECIA 11/30/2008    MBess Harvest PTA 02/27/19 2:12 PM   [facility] [address] [place] [address]Phone: [phone]  Fax:  [phone] Name: Lauren WANNINGERMRN: [id number] of Birth: Lauren Wolfe

## 2019-03-03 ENCOUNTER — Other Ambulatory Visit: Payer: Self-pay | Admitting: Family Medicine

## 2019-03-04 ENCOUNTER — Other Ambulatory Visit: Payer: Self-pay

## 2019-03-04 ENCOUNTER — Encounter: Payer: Self-pay | Admitting: Internal Medicine

## 2019-03-04 ENCOUNTER — Ambulatory Visit (INDEPENDENT_AMBULATORY_CARE_PROVIDER_SITE_OTHER): Payer: 59 | Admitting: Internal Medicine

## 2019-03-04 VITALS — BP 126/82 | HR 85 | Temp 98.0°F | Ht 66.0 in | Wt 223.0 lb

## 2019-03-04 DIAGNOSIS — M545 Low back pain, unspecified: Secondary | ICD-10-CM

## 2019-03-04 DIAGNOSIS — M25562 Pain in left knee: Secondary | ICD-10-CM | POA: Insufficient documentation

## 2019-03-04 DIAGNOSIS — I10 Essential (primary) hypertension: Secondary | ICD-10-CM | POA: Diagnosis not present

## 2019-03-04 DIAGNOSIS — R1032 Left lower quadrant pain: Secondary | ICD-10-CM | POA: Diagnosis not present

## 2019-03-04 DIAGNOSIS — M172 Bilateral post-traumatic osteoarthritis of knee: Secondary | ICD-10-CM | POA: Insufficient documentation

## 2019-03-04 MED ORDER — TRAMADOL HCL 50 MG PO TABS: 50.0000 mg | ORAL_TABLET | Freq: Four times a day (QID) | ORAL | 0 refills | Status: DC | PRN

## 2019-03-04 NOTE — Assessment & Plan Note (Signed)
Mild recent likely related to gait change favoring the left leg, not related to the fall, cont tramadol prn, f/u sport med

## 2019-03-04 NOTE — Assessment & Plan Note (Signed)
?   msk strain, for tramadol prn, refer Dr Smith/sports med second opinion

## 2019-03-04 NOTE — Patient Instructions (Signed)
Please take all new medication as prescribed - the pain medication as needed  You will be contacted regarding the referral for: Sports Medicine  Please continue all other medications as before, and refills have been done if requested.  Please have the pharmacy call with any other refills you may need.  Please continue your efforts at being more active, low cholesterol diet, and weight control..  Please keep your appointments with your specialists as you may have planned

## 2019-03-04 NOTE — Assessment & Plan Note (Signed)
stable overall by history and exam, recent data reviewed with pt, and pt to continue medical treatment as before,  to f/u any worsening symptoms or concerns  

## 2019-03-04 NOTE — Progress Notes (Signed)
Subjective:    Patient ID: Lauren Wolfe, female    DOB: [birth year], 43 y.o.   MRN: [id number]  HPI  Here with c/o persistent left groin pain, as well as left knee pain and swelling x 8 wks after a slip in [employer]. Has seen Sports Medicine with tx with muscle relaxer, and MRI Left knee without mild patellar cartilage loss and vague swelling, no meniscal tear.  Has had no giveaways or falls.  Pt denies fever, wt loss, night sweats, loss of appetite, or other constitutional symptoms  Pt denies chest pain, increased sob or doe, wheezing, orthopnea, PND, increased LE swelling, palpitations, dizziness or syncope.  Pt denies new neurological symptoms such as new headache, or facial or extremity weakness or numbness  Also has some low back pain but only onset in the past wk, and recalls recently favoring the left leg and walking differently.   Past Medical History:  Diagnosis Date  . Anxiety   . Arm vein blood clot, right   . Migraine   . Wears glasses    and contacts   Past Surgical History:  Procedure Laterality Date  . BREAST BIOPSY  10/31/2012   Procedure: BREAST BIOPSY WITH NEEDLE LOCALIZATION;  Surgeon: Rolm Bookbinder, MD;  Location: [facility];  Service: General;  Laterality: Right;  right breast wire guided excisional biopsy  . ganglion cysts  2011  . WRIST SURGERY Left     reports that she has never smoked. She has never used smokeless tobacco. She reports that she does not drink alcohol or use drugs. family history includes Aneurysm in her mother; Breast cancer in her cousin and another family member; Diabetes in an other family member; Gout in an other family member; Hypertension in her mother and another family member; Migraines in her paternal aunt; Other in an other family member; Stomach cancer in her father. Allergies  Allergen Reactions  . Azithromycin Shortness Of Breath    REACTION: rash, prurutis  . Codeine Shortness Of Breath    Causes shortness of  breath, rash, itching  . Hydrocodone Shortness Of Breath    Rash, itching  . Ketorolac Tromethamine Shortness Of Breath    Itching, rash  . Prednisone     SOB  . Tramadol     itching   Current Outpatient Medications on File Prior to Visit  Medication Sig Dispense Refill  . Galcanezumab-gnlm (EMGALITY) 120 MG/ML SOAJ Inject 120 mg into the skin every 30 (thirty) days. 1 pen 5  . ibuprofen (ADVIL) 600 MG tablet TAKE 1 TABLET (600 MG TOTAL) BY MOUTH EVERY 8 (EIGHT) HOURS. 90 tablet 1  . indomethacin (INDOCIN) 25 MG capsule TAKE ONE CAPSULE BY MOUTH 3 TIMES A DAY AS NEEDED MUST LAST 30 DAYS 30 capsule 0  . levonorgestrel (MIRENA) 20 MCG/24HR IUD 1 each by Intrauterine route once.    . ondansetron (ZOFRAN-ODT) 8 MG disintegrating tablet TAKE 1 TABLET (8 MG TOTAL) BY MOUTH EVERY 8 (EIGHT) HOURS AS NEEDED FOR NAUSEA OR VOMITING. 20 tablet 2  . SUMAtriptan (IMITREX) 6 MG/0.5ML SOLN injection One injection at onset of migraine.  May repeat in 2 hrs, if needed.  Max dose: 2 inj/day. This is a 30 day prescription. 12 vial 0  . tiZANidine (ZANAFLEX) 4 MG tablet TAKE ONE TABLET BY MOUTH THREE TIMES A DAY AS NEEDED FOR MIGRAINES FOR 30 DAYS 30 tablet 0  . triamcinolone cream (KENALOG) 0.1 % Apply 1 application topically 2 (two) times daily. 30 g  2   No current facility-administered medications on file prior to visit.    Review of Systems  Constitutional: Negative for other unusual diaphoresis or sweats HENT: Negative for ear discharge or swelling Eyes: Negative for other worsening visual disturbances Respiratory: Negative for stridor or other swelling  Gastrointestinal: Negative for worsening distension or other blood Genitourinary: Negative for retention or other urinary change Musculoskeletal: Negative for other MSK pain or swelling Skin: Negative for color change or other new lesions Neurological: Negative for worsening tremors and other numbness  Psychiatric/Behavioral: Negative for worsening  agitation or other fatigue All other system neg per pt    Objective:   Physical Exam BP 126/82   Pulse 85   Temp 98 F (36.7 C) (Oral)   Ht 5' 6"  (1.676 m)   Wt 223 lb (101.2 kg)   SpO2 97%   BMI 35.99 kg/m  VS noted,  Constitutional: Pt appears in NAD HENT: Head: NCAT.  Right Ear: External ear normal.  Left Ear: External ear normal.  Eyes: . Pupils are equal, round, and reactive to light. Conjunctivae and EOM are normal Nose: without d/c or deformity Neck: Neck supple. Gross normal ROM Cardiovascular: Normal rate and regular rhythm.   Pulmonary/Chest: Effort normal and breath sounds without rales or wheezing.  Abd:  Soft, NT, ND, + BS, no organomegaly Left groin area without tender, swelling Non tender area over the left greater trochanter Left knee with lateral warm and small swelling, mild tender with FROM Neurological: Pt is alert. At baseline orientation, motor grossly intact Skin: Skin is warm. No rashes, other new lesions, no LE edema Psychiatric: Pt behavior is normal without agitation  No other exam findings Lab Results  Component Value Date   WBC 8.8 10/21/2018   HGB 14.7 10/21/2018   HCT 44.4 10/21/2018   PLT 277.0 10/21/2018   GLUCOSE 98 10/21/2018   CHOL 191 10/21/2018   TRIG 109.0 10/21/2018   HDL 60.40 10/21/2018   LDLDIRECT 164.5 09/19/2012   LDLCALC 109 (H) 10/21/2018   ALT 20 10/21/2018   AST 17 10/21/2018   NA 139 10/21/2018   K 3.8 10/21/2018   CL 102 10/21/2018   CREATININE 0.86 10/21/2018   BUN 9 10/21/2018   CO2 29 10/21/2018   TSH 1.85 10/21/2018      Assessment & Plan:

## 2019-03-04 NOTE — Assessment & Plan Note (Signed)
Etiology unclear, left knee did not show meniscal tear but only non specific lateral aspect swelling persistent for over 8 wks, for sport med f/u and may benefit from knee u/s

## 2019-03-06 ENCOUNTER — Ambulatory Visit: Payer: 59 | Attending: Family Medicine | Admitting: Physical Therapy

## 2019-03-06 ENCOUNTER — Encounter: Payer: Self-pay | Admitting: Physical Therapy

## 2019-03-06 ENCOUNTER — Other Ambulatory Visit: Payer: Self-pay

## 2019-03-06 DIAGNOSIS — R29898 Other symptoms and signs involving the musculoskeletal system: Secondary | ICD-10-CM | POA: Diagnosis present

## 2019-03-06 DIAGNOSIS — M25562 Pain in left knee: Secondary | ICD-10-CM | POA: Diagnosis not present

## 2019-03-06 DIAGNOSIS — R262 Difficulty in walking, not elsewhere classified: Secondary | ICD-10-CM | POA: Diagnosis present

## 2019-03-06 DIAGNOSIS — M25552 Pain in left hip: Secondary | ICD-10-CM | POA: Insufficient documentation

## 2019-03-06 NOTE — Therapy (Signed)
[facility] [address]  [place], [place], [address] Phone: [phone]   Fax:  [phone]  Physical Therapy Treatment  Patient Details  Name: Lauren Wolfe MRN: [id number] Date of Birth: [birth year] Referring Provider (PT): Karlton Lemon, MD   Encounter Date: 03/06/2019  PT End of Session - 03/06/19 1350    Visit Number  3    Number of Visits  7    Date for PT Re-Evaluation  04/01/19    Authorization Type  UHC    PT Start Time  1306    PT Stop Time  1345    PT Time Calculation (min)  39 min    Activity Tolerance  Patient tolerated treatment well;Patient limited by pain    Behavior During Therapy  [facility] for tasks assessed/performed       Past Medical History:  Diagnosis Date  . Anxiety   . Arm vein blood clot, right   . Migraine   . Wears glasses    and contacts    Past Surgical History:  Procedure Laterality Date  . BREAST BIOPSY  10/31/2012   Procedure: BREAST BIOPSY WITH NEEDLE LOCALIZATION;  Surgeon: Rolm Bookbinder, MD;  Location: [facility];  Service: General;  Laterality: Right;  right breast wire guided excisional biopsy  . ganglion cysts  2011  . WRIST SURGERY Left     There were no vitals filed for this visit.  Subjective Assessment - 03/06/19 1306    Subjective  Reports that "things have been terrible." Has been having really bad pain in her hip and knee and is going to get a second opinion. Felt fine after last session, but pain started 2 days later. Went back to work today.     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 Patellar partial-thickness cartilage loss. Focal abnormal edema in Hoffa's fat pad    Patient Stated Goals  "I just want  to be able to get back to walking"    Currently in Pain?  Yes    Pain Score  4     Pain Location  Knee    Pain Orientation  Left    Pain Descriptors / Indicators  Aching    Pain Type  Acute pain                       [facility] Adult PT Treatment/Exercise - 03/06/19 0001      Exercises   Exercises  Knee/Hip      Knee/Hip Exercises: Stretches   Hip Flexor Stretch  Left;2 reps;30 seconds    Hip Flexor Stretch Limitations  modified thomas position with strap    cues to avoid pushing into pain   ITB Stretch  Left;2 reps;30 seconds    ITB Stretch Limitations  with strap     Piriformis Stretch  Left;30 seconds;1 rep    Piriformis Stretch Limitations  KTOS      Knee/Hip Exercises: Aerobic   Recumbent Bike  L1 x 47mn      Knee/Hip Exercises: Seated   Sit to Sand  1 set;10 reps;without UE support   on 1 airex; audible L knee popping      Knee/Hip Exercises: Supine   Bridges with [facility] [employer]  Strengthening;Both;1 set;10 reps    Straight Leg Raises  Strengthening;Left;1 set;10 reps    Straight Leg Raises Limitations  tendency to "plop" leg on mat   pain in hip flexor     Knee/Hip Exercises: Sidelying   Hip ABduction  Strengthening;Left;1 set;10 reps    Hip ABduction Limitations  cues for alignment    Hip ADduction  Strengthening;Right;1 set;10 reps    Hip ADduction Limitations  opposite LE propped on bolster   cues for TKE     Manual Therapy   Manual Therapy  Passive ROM;Taping    Manual therapy comments  R sidelying    Passive ROM  L ober stretch 2x30"    Kinesiotex  Create Space      Kinesiotix   Create Space  L chondramalacia patellae pattern             PT Education - 03/06/19 1346    Education Details  update to HEP; edu on KT tape wear time, precautions, removal    Person(s) Educated  Patient    Methods  Explanation;Demonstration;Tactile cues;Verbal cues;Handout    Comprehension  Verbalized understanding;Returned demonstration       PT Short  Term Goals - 02/27/19 1312      PT SHORT TERM GOAL #1   Title  Patient to be independent with initial HEP    Time  3    Period  Weeks    Status  On-going    Target Date  03/11/19        PT Long Term Goals - 02/27/19 1313      PT LONG TERM GOAL #1   Title  Patient to be independent with advanced HEP     Time  6    Period  Weeks    Status  On-going      PT LONG TERM GOAL #2   Title  Patient to demonstrate L hip AROM WFL and without pain limiting.     Time  6    Period  Weeks    Status  On-going      PT LONG TERM GOAL #3   Title  Patient to demonstrate L knee AROM/PROM WFL and without pain limiting.     Time  6    Period  Weeks    Status  On-going      PT LONG TERM GOAL #4   Title  Patient to demonstrate stair climbing up/down 13 steps with 1 handrail as needed with good stability and no c/o pain.    Time  6    Period  Weeks    Status  On-going      PT LONG TERM GOAL #5   Title  Patient to return to walking 5 miles without pain limiting.     Time  6    Period  Weeks    Status  On-going            Plan - 03/06/19 1351    Clinical Impression Statement  Patient arrived to session with report of increasing L knee and hip pain over the last week without known cause. Focused today's session on gentle LE stretching d/t pain. Patient with difficulty feeling stretch in L TFL in supine, thus performed manual stretch. Unable to tolerate L sidelying d/t hip pain- only performed sidelying hip abduction and adduction on noninvolved side. Updated HEP with lateral hip stretches which were well-tolerated today. Patient reported understanding. Ended session with KT tape to L knee for pain relief. Patient  without complaints at end of session.     Comorbidities  migraine, anxiety, L wrist surgery    PT Treatment/Interventions  ADLs/Self Care Home Management;Cryotherapy;Electrical Stimulation;Iontophoresis 27m/ml Dexamethasone;Functional mobility training;Stair training;Gait  training;Ultrasound;Moist Heat;Therapeutic activities;Therapeutic exercise;Balance training;Neuromuscular re-education;Patient/family education;Orthotic Fit/Training;Passive range of motion;Manual techniques;Dry needling;Energy conservation;Taping;Vasopneumatic Device    PT Next Visit Plan  Monitor tolerance to therex progression; gentle strengthening as tolerated; trial of K-taping as indicated; modalities as indicated    Consulted and Agree with Plan of Care  Patient       Patient will benefit from skilled therapeutic intervention in order to improve the following deficits and impairments:  Decreased activity tolerance, Decreased strength, Pain, Difficulty walking, Decreased range of motion, Postural dysfunction, Impaired flexibility  Visit Diagnosis: Acute pain of left knee  Pain in left hip  Difficulty in walking, not elsewhere classified  Other symptoms and signs involving the musculoskeletal system     Problem List Patient Active Problem List   Diagnosis Date Noted  . Left knee pain 03/04/2019  . Left groin pain 03/04/2019  . Low back pain 03/04/2019  . HLD (hyperlipidemia) 10/21/2018  . Chronic migraine 04/23/2017  . Rash 04/19/2017  . Arm DVT (deep venous thromboembolism), acute, right ([facility] 02/22/2017  . Occipital headache 07/17/2013  . Impingement syndrome of right shoulder 03/04/2013  . Preventative health care 09/24/2012  . Essential hypertension 06/16/2009  . Anxiety state 05/23/2009  . DEPRESSION 05/23/2009  . INSOMNIA-SLEEP DISORDER-UNSPEC 05/19/2009  . Migraine with intractable migraine, so stated, with status migrainosus 04/07/2009  . ALOPECIA 11/30/2008    YJanene Harvey PT, DPT 03/06/19 1:57 PM   [facility] [address] [place] [address]Phone: [phone]  Fax:  [phone] Name: NNORVA BOWEMRN: [id number] of Birth: 911-26-1977

## 2019-03-13 ENCOUNTER — Ambulatory Visit: Payer: 59

## 2019-03-13 ENCOUNTER — Other Ambulatory Visit: Payer: Self-pay

## 2019-03-13 DIAGNOSIS — R262 Difficulty in walking, not elsewhere classified: Secondary | ICD-10-CM

## 2019-03-13 DIAGNOSIS — M25552 Pain in left hip: Secondary | ICD-10-CM

## 2019-03-13 DIAGNOSIS — R29898 Other symptoms and signs involving the musculoskeletal system: Secondary | ICD-10-CM

## 2019-03-13 DIAGNOSIS — M25562 Pain in left knee: Secondary | ICD-10-CM | POA: Diagnosis not present

## 2019-03-13 NOTE — Therapy (Signed)
[facility] [address]  [place], [place], [address] Phone: [phone]   Fax:  [phone]  Physical Therapy Treatment  Patient Details  Name: Lauren Wolfe MRN: [id number] Date of Birth: 05-02-1976 Referring Provider (PT): Karlton Lemon, MD   Encounter Date: 03/13/2019  PT End of Session - 03/13/19 1313    Visit Number  4    Number of Visits  7    Date for PT Re-Evaluation  04/01/19    Authorization Type  UHC    PT Start Time  1300    PT Stop Time  1350    PT Time Calculation (min)  50 min    Activity Tolerance  Patient tolerated treatment well;Patient limited by pain    Behavior During Therapy  [facility] for tasks assessed/performed       Past Medical History:  Diagnosis Date  . Anxiety   . Arm vein blood clot, right   . Migraine   . Wears glasses    and contacts    Past Surgical History:  Procedure Laterality Date  . BREAST BIOPSY  10/31/2012   Procedure: BREAST BIOPSY WITH NEEDLE LOCALIZATION;  Surgeon: Rolm Bookbinder, MD;  Location: [facility];  Service: General;  Laterality: Right;  right breast wire guided excisional biopsy  . ganglion cysts  2011  . WRIST SURGERY Left     There were no vitals filed for this visit.  Subjective Assessment - 03/13/19 1304    Subjective  Reports that "things have been terrible." Has been having really bad pain in her hip and knee and is going to get a second opinion. Felt fine after last session, but pain started 2 days later. Went back to work today.     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 Patellar partial-thickness cartilage loss. Focal abnormal edema in Hoffa's fat pad    Patient Stated Goals  "I just want  to be able to get back to walking"    Pain Score  0-No pain   up to 4/10 L knee   Pain Location  Knee    Pain Orientation  Left    Pain Descriptors / Indicators  Aching    Multiple Pain Sites  No         [facility] PT Assessment - 03/13/19 0001      Assessment   Medical Diagnosis  Acute pain of L knee    Referring Provider (PT)  Karlton Lemon, MD    Onset Date/Surgical Date  01/06/19    Next MD Visit  03/20/19   This appointment on 03/20/19 with Lyndal Pulley, DO   Prior Therapy  Yes      AROM   AROM Assessment Site  Hip;Knee    Right/Left Hip  Right;Left    Right Hip Flexion  109    Right Hip External Rotation   41    Right Hip Internal Rotation   38    Right Hip ABduction  41   standing; 45 supine    Left Hip Flexion  103   mild L knee pain   Left Hip External Rotation   46    Left Hip Internal Rotation   34    Left Hip ABduction  38  standing; supine 40   Right/Left Knee  Left    Left Knee Extension  -2   2 dg quad lag with SLR   Left Knee Flexion  133      PROM   PROM Assessment Site  Knee    Right/Left Knee  Right;Left    Left Knee Extension  -2   hyperextension    Left Knee Flexion  135      Strength   Right/Left Hip  Right;Left    Right Hip Flexion  4+/5    Right Hip ABduction  4+/5    Right Hip ADduction  4+/5    Left Hip Flexion  4+/5    Left Hip ABduction  5/5    Left Hip ADduction  4/5    Right/Left Knee  Right;Left    Right Knee Flexion  4+/5    Right Knee Extension  5/5    Left Knee Flexion  4+/5    Left Knee Extension  4+/5    Right/Left Ankle  Right;Left    Right Ankle Dorsiflexion  4+/5    Right Ankle Plantar Flexion  5/5    Left Ankle Dorsiflexion  4+/5    Left Ankle Plantar Flexion  5/5                   [facility] Adult PT Treatment/Exercise - 03/13/19 0001      Knee/Hip Exercises: Stretches   Quad Stretch  Left;1 rep;60 seconds    Quad Stretch Limitations  prone with strap and bolster under anterior thigh     Piriformis  Stretch  Left;30 seconds;2 reps    Piriformis Stretch Limitations  KTOS, Figure-4      Knee/Hip Exercises: Aerobic   Recumbent Bike  L1 x 53mn      Knee/Hip Exercises: Supine   Bridges with Ball Squeeze  Strengthening;Both;1 set   x 12 rpes    Bridges with Clamshell  Both;10 reps;Strengthening   With isometric hip abd/ER into Red TB at knees               PT Short Term Goals - 03/13/19 1314      PT SHORT TERM GOAL #1   Title  Patient to be independent with initial HEP    Time  3    Period  Weeks    Status  Achieved    Target Date  03/11/19        PT Long Term Goals - 03/13/19 1314      PT LONG TERM GOAL #1   Title  Patient to be independent with advanced HEP     Time  6    Period  Weeks    Status  Partially Met      PT LONG TERM GOAL #2   Title  Patient to demonstrate L hip AROM WFL and without pain limiting.     Time  6    Period  Weeks    Status  Partially Met   03/13/19: Pt. able to demo progress with nearly all L knee AROM however noted pain with end range IR     PT LONG TERM GOAL #3   Title  Patient to demonstrate L knee AROM/PROM [facility] pain limiting.     Time  6    Period  Weeks    Status  Partially Met   Pt. able to demo L knee AROM -2-133 dg, L knee PROM -2-135 dg however noted L knee pain with end  range ext     PT LONG TERM GOAL #4   Title  Patient to demonstrate stair climbing up/down 13 steps with 1 handrail as needed with good stability and no c/o pain.    Time  6    Period  Weeks    Status  On-going   03/13/19: pt. noting L knee pain on eccentric lowering and limited control of descnending     PT LONG TERM GOAL #5   Title  Patient to return to walking 5 miles without pain limiting.     Time  6    Period  Weeks    Status  Partially Met   03/13/19: Pt. noting able to walk 1-2 miles at this point however ache pain next day           Plan - 03/13/19 1636    Clinical Impression Statement  Pt. demonstrating progress with  therapy thus far however primary complaint remains L knee pain with descending stairs and at times with prolonged walking after ~ 1 mile.  Pt. able to achieve all STGs at this time in therapy reporting she feels independent with HEP.  Able to demo progress toward LTGs.  Pt. able to partially achieve LTG #2 demonstrating improved L hip AROM however complaint of mild L hip at end range IR.  Pt. able to partially achieve LTG #3 demonstrating L knee AROM -2-133 dg, PROM -2-135 dg however complaint of L knee pain at TKE extension.  LTG #4 ongoing at this point with pt. demonstrating L quad instability and with complaint of pain descending stairs today.  Not progressing toward LTG #5 however noting L knee pain limits her most after 1-2 miles walking.  Pt. noting f/u with MD upcoming on 03/20/19.    Comorbidities  migraine, anxiety, L wrist surgery    PT Treatment/Interventions  ADLs/Self Care Home Management;Cryotherapy;Electrical Stimulation;Iontophoresis 40m/ml Dexamethasone;Functional mobility training;Stair training;Gait training;Ultrasound;Moist Heat;Therapeutic activities;Therapeutic exercise;Balance training;Neuromuscular re-education;Patient/family education;Orthotic Fit/Training;Passive range of motion;Manual techniques;Dry needling;Energy conservation;Taping;Vasopneumatic Device    PT Next Visit Plan  Gentle strengthening as tolerated; K-taping as indicated; modalities as indicated    Consulted and Agree with Plan of Care  Patient       Patient will benefit from skilled therapeutic intervention in order to improve the following deficits and impairments:  Decreased activity tolerance, Decreased strength, Pain, Difficulty walking, Decreased range of motion, Postural dysfunction, Impaired flexibility  Visit Diagnosis: Acute pain of left knee - Plan:   Pain in left hip - Plan:   Difficulty in walking, not elsewhere classified - Plan:   Other symptoms and signs involving the musculoskeletal system -  Plan:      Problem List Patient Active Problem List   Diagnosis Date Noted  . Left knee pain 03/04/2019  . Left groin pain 03/04/2019  . Low back pain 03/04/2019  . HLD (hyperlipidemia) 10/21/2018  . Chronic migraine 04/23/2017  . Rash 04/19/2017  . Arm DVT (deep venous thromboembolism), acute, right ([facility] 02/22/2017  . Occipital headache 07/17/2013  . Impingement syndrome of right shoulder 03/04/2013  . Preventative health care 09/24/2012  . Essential hypertension 06/16/2009  . Anxiety state 05/23/2009  . DEPRESSION 05/23/2009  . INSOMNIA-SLEEP DISORDER-UNSPEC 05/19/2009  . Migraine with intractable migraine, so stated, with status migrainosus 04/07/2009  . ALOPECIA 11/30/2008    MBess Harvest PTA 03/13/19 4:38 PM   [facility] [address] [place] [address]Phone: [phone]  Fax:  [phone]  Name: Lauren Wolfe MRN: [id number] Date of Birth: [birth year]

## 2019-03-17 ENCOUNTER — Other Ambulatory Visit: Payer: Self-pay | Admitting: Obstetrics and Gynecology

## 2019-03-17 DIAGNOSIS — R928 Other abnormal and inconclusive findings on diagnostic imaging of breast: Secondary | ICD-10-CM

## 2019-03-20 ENCOUNTER — Ambulatory Visit: Payer: Self-pay

## 2019-03-20 ENCOUNTER — Ambulatory Visit (INDEPENDENT_AMBULATORY_CARE_PROVIDER_SITE_OTHER): Payer: 59 | Admitting: Family Medicine

## 2019-03-20 ENCOUNTER — Encounter: Payer: Self-pay | Admitting: Physical Therapy

## 2019-03-20 ENCOUNTER — Encounter: Payer: Self-pay | Admitting: Family Medicine

## 2019-03-20 ENCOUNTER — Other Ambulatory Visit: Payer: Self-pay

## 2019-03-20 ENCOUNTER — Ambulatory Visit: Payer: 59 | Admitting: Physical Therapy

## 2019-03-20 VITALS — BP 120/82 | HR 76 | Ht 66.0 in | Wt 223.0 lb

## 2019-03-20 DIAGNOSIS — M25552 Pain in left hip: Secondary | ICD-10-CM

## 2019-03-20 DIAGNOSIS — M79605 Pain in left leg: Secondary | ICD-10-CM

## 2019-03-20 DIAGNOSIS — M25562 Pain in left knee: Secondary | ICD-10-CM | POA: Diagnosis not present

## 2019-03-20 DIAGNOSIS — R1032 Left lower quadrant pain: Secondary | ICD-10-CM | POA: Diagnosis not present

## 2019-03-20 DIAGNOSIS — M794 Hypertrophy of (infrapatellar) fat pad: Secondary | ICD-10-CM | POA: Diagnosis not present

## 2019-03-20 DIAGNOSIS — R262 Difficulty in walking, not elsewhere classified: Secondary | ICD-10-CM

## 2019-03-20 DIAGNOSIS — R29898 Other symptoms and signs involving the musculoskeletal system: Secondary | ICD-10-CM

## 2019-03-20 MED ORDER — PENNSAID 2 % TD SOLN: 2.0000 g | Freq: Two times a day (BID) | TRANSDERMAL | 3 refills | Status: DC

## 2019-03-20 MED ORDER — VITAMIN D (ERGOCALCIFEROL) 1.25 MG (50000 UNIT) PO CAPS: 50000.0000 [IU] | ORAL_CAPSULE | ORAL | 0 refills | Status: DC

## 2019-03-20 NOTE — Patient Instructions (Signed)
Good to see you.  Ice 20 minutes 2 times daily. Usually after activity and before bed. pennsaid pinkie amount topically 2 times daily as needed.  Continue PT  Once weekly vitamin D for 12 weeks.  See me again in 4 weeks

## 2019-03-20 NOTE — Assessment & Plan Note (Signed)
Patient is not having any significant discomfort or pain at the moment.  Does have some mild discomfort but I do not think it is likely contributing to anything intra-articular.  Likely more secondary to compensation to the knee.  Follow-up again in 4 to 6 weeks

## 2019-03-20 NOTE — Assessment & Plan Note (Signed)
Patient's left knee pain seems to be more secondary to a direct impact of the fall.  Patient's MRI did show the patient did have some fat pad irritation.  Ultrasound today shows medial possibility for subtle injury on the inferior aspect of the patella.  Once weekly vitamin D given, patient given a prescription for a light stability brace.  Topical anti-inflammatories given.  I am sure that this will heal completely with no restrictions in the long run but he still may take another 1 to 2 months until fully resolved.  Encouraged her to continue with the physical therapy at this time.  Follow-up again in 4 weeks to make sure patient is improving.

## 2019-03-20 NOTE — Progress Notes (Signed)
[facility] [address], [place] [address] Phone: [phone] Subjective:   I Lauren Wolfe am serving as a Education administrator for Dr. Hulan Saas.   CC: Left groin pain Knee Pain  LFY:[id number]  Lauren Wolfe is a 43 y.o. female coming in with complaint of left sided groin and knee pain. State she fell twice.  This was the back.  This was when she was at [facility] and slipped on laundry detergent has been going to PT. Has had xrays and MRI.  Patient had MRI of the knee demonstrates irritation on the hoffa fat + pads but otherwise fairly unremarkable.  This was independently visualized by me.  Onset- April 6th  Location - knee cap, groin Character- aggravation  Aggravating factors- working  Reliving factors-  Therapies tried- ice, heat, creams, medication  Severity-7 out of 10 still. Patient is continuously go to formal physical therapy at this time.    Past Medical History:  Diagnosis Date  . Anxiety   . Arm vein blood clot, right   . Migraine   . Wears glasses    and contacts   Past Surgical History:  Procedure Laterality Date  . BREAST BIOPSY  10/31/2012   Procedure: BREAST BIOPSY WITH NEEDLE LOCALIZATION;  Surgeon: Rolm Bookbinder, MD;  Location: [place];  Service: General;  Laterality: Right;  right breast wire guided excisional biopsy  . ganglion cysts  2011  . WRIST SURGERY Left    Social History   Socioeconomic History  . Marital status: Single    Spouse name: Not on file  . Number of children: 1  . Years of education: some college  . Highest education level: Not on file  Occupational History  . Occupation: cust service rep w/ [employer]  . Financial resource strain: Not on file  . Food insecurity    Worry: Not on file    Inability: Not on file  . Transportation needs    Medical: Not on file    Non-medical: Not on file  Tobacco Use  . Smoking status: Never Smoker  . Smokeless tobacco: Never  Used  Substance and Sexual Activity  . Alcohol use: No  . Drug use: No  . Sexual activity: Yes    Birth control/protection: Pill  Lifestyle  . Physical activity    Days per week: Not on file    Minutes per session: Not on file  . Stress: Not on file  Relationships  . Social Herbalist on phone: Not on file    Gets together: Not on file    Attends religious service: Not on file    Active member of club or organization: Not on file    Attends meetings of clubs or organizations: Not on file    Relationship status: Not on file  Other Topics Concern  . Not on file  Social History Narrative   Lives at home with her daughter.   Right-handed.   No caffeine use.   Allergies  Allergen Reactions  . Azithromycin Shortness Of Breath    REACTION: rash, prurutis  . Codeine Shortness Of Breath    Causes shortness of breath, rash, itching  . Hydrocodone Shortness Of Breath    Rash, itching  . Ketorolac Tromethamine Shortness Of Breath    Itching, rash  . Prednisone     SOB  . Tramadol     itching   Family History  Problem Relation Age  of Onset  . Hypertension Mother   . Aneurysm Mother   . Stomach cancer Father   . Other Other        grandparent with DJD  . Hypertension Other        grandparent  . Diabetes Other        grandparent  . Gout Other        grandfather  . Breast cancer Other        grandmother  . Migraines Paternal Aunt   . Breast cancer Cousin     Current Outpatient Medications (Endocrine & Metabolic):  .  levonorgestrel (MIRENA) 20 MCG/24HR IUD, 1 each by Intrauterine route once.    Current Outpatient Medications (Analgesics):  Marland Kitchen  Galcanezumab-gnlm (EMGALITY) 120 MG/ML SOAJ, Inject 120 mg into the skin every 30 (thirty) days. Marland Kitchen  ibuprofen (ADVIL) 600 MG tablet, TAKE 1 TABLET (600 MG TOTAL) BY MOUTH EVERY 8 (EIGHT) HOURS. .  indomethacin (INDOCIN) 25 MG capsule, TAKE ONE CAPSULE BY MOUTH 3 TIMES A DAY AS NEEDED MUST LAST 30 DAYS .  SUMAtriptan  (IMITREX) 6 MG/0.5ML SOLN injection, One injection at onset of migraine.  May repeat in 2 hrs, if needed.  Max dose: 2 inj/day. This is a 30 day prescription. .  traMADol (ULTRAM) 50 MG tablet, Take 1 tablet (50 mg total) by mouth every 6 (six) hours as needed.   Current Outpatient Medications (Other):  .  ondansetron (ZOFRAN-ODT) 8 MG disintegrating tablet, TAKE 1 TABLET (8 MG TOTAL) BY MOUTH EVERY 8 (EIGHT) HOURS AS NEEDED FOR NAUSEA OR VOMITING. Marland Kitchen  tiZANidine (ZANAFLEX) 4 MG tablet, TAKE ONE TABLET BY MOUTH THREE TIMES A DAY AS NEEDED FOR MIGRAINES FOR 30 DAYS .  triamcinolone cream (KENALOG) 0.1 %, Apply 1 application topically 2 (two) times daily. .  Diclofenac Sodium (PENNSAID) 2 % SOLN, Place 2 g onto the skin 2 (two) times daily. .  Vitamin D, Ergocalciferol, (DRISDOL) 1.25 MG (50000 UT) CAPS capsule, Take 1 capsule (50,000 Units total) by mouth every 7 (seven) days.    Past medical history, social, surgical and family history all reviewed in electronic medical record.  No pertanent information unless stated regarding to the chief complaint.   Review of Systems:  No headache, visual changes, nausea, vomiting, diarrhea, constipation, dizziness, abdominal pain, skin rash, fevers, chills, night sweats, weight loss, swollen lymph nodes, body aches, joint swelling,  chest pain, shortness of breath, mood changes.  Positive muscle aches  Objective  Blood pressure 120/82, pulse 76, height 5' 6"  (1.676 m), weight 223 lb (101.2 kg), SpO2 97 %.    General: No apparent distress alert and oriented x3 mood and affect normal, dressed appropriately.  HEENT: Pupils equal, extraocular movements intact  Respiratory: Patient's speak in full sentences and does not appear short of breath  Cardiovascular: No lower extremity edema, non tender, no erythema  Skin: Warm dry intact with no signs of infection or rash on extremities or on axial skeleton.  Abdomen: Soft nontender  Neuro: Cranial nerves II  through XII are intact, neurovascularly intact in all extremities with 2+ DTRs and 2+ pulses.  Lymph: No lymphadenopathy of posterior or anterior cervical chain or axillae bilaterally.  Gait normal with good balance and coordination.  MSK:  Non tender with full range of motion and good stability and symmetric strength and tone of shoulders, elbows, wrist, and ankles bilaterally.  Patient is having tenderness and some mild discomfort and pain.  Full range of motion.  Patient has pain  more over the sartorius area.  Negative straight leg test.  Left knee exam shows the patient does still have some swelling anteriorly.  Seems to be more around the patella tendon.  Full range of motion except lacking the last 2 degrees of flexion.  No significant instability noted.  No instability of the MCL or LCL. Contralateral knee unremarkable  MSK US performed of: Left knee This study was ordered, performed, and interpreted by Lauren Wolfe D.O.  Knee: Limited musculoskeletal ultrasound of the knee shows the patient still has some irritation and hypoechoic changes within the fat pad of the knee.  Patellofemoral joint does have some mild possible synovitis versus contusion on the inferior aspect IMPRESSION: Slowly resolving contusion of fat pad.   Impression and Recommendations:     This case required medical decision making of moderate complexity. The above documentation has been reviewed and is accurate and complete Lauren Pulley, DO       Note: This dictation was prepared with Dragon dictation along with smaller phrase technology. Any transcriptional errors that result from this process are unintentional.

## 2019-03-20 NOTE — Therapy (Signed)
[facility] [address]  [place], [place], [address] Phone: [phone]   Fax:  [phone]  Physical Therapy Treatment  Patient Details  Name: Lauren Wolfe MRN: [id number] Date of Birth: Feb 25, 1976 Referring Provider (PT): Karlton Lemon, MD   Encounter Date: 03/20/2019  PT End of Session - 03/20/19 1348    Visit Number  5    Number of Visits  7    Date for PT Re-Evaluation  04/01/19    Authorization Type  UHC    PT Start Time  1302    PT Stop Time  1344    PT Time Calculation (min)  42 min    Activity Tolerance  Patient tolerated treatment well;Patient limited by pain    Behavior During Therapy  [facility] for tasks assessed/performed       Past Medical History:  Diagnosis Date  . Anxiety   . Arm vein blood clot, right   . Migraine   . Wears glasses    and contacts    Past Surgical History:  Procedure Laterality Date  . BREAST BIOPSY  10/31/2012   Procedure: BREAST BIOPSY WITH NEEDLE LOCALIZATION;  Surgeon: Rolm Bookbinder, MD;  Location: [place];  Service: General;  Laterality: Right;  right breast wire guided excisional biopsy  . ganglion cysts  2011  . WRIST SURGERY Left     There were no vitals filed for this visit.  Subjective Assessment - 03/20/19 1303    Subjective  Reports that she is doing okay today. Has MD appointment today.    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 Patellar partial-thickness cartilage loss. Focal abnormal edema in Hoffa's fat pad    Patient Stated Goals  "I just want to be able to get back to walking"    Currently in Pain?  Yes    Pain Score  4     Pain Location  Hip    Pain Orientation  Left;Anterior    Pain  Descriptors / Indicators  Aching    Pain Type  Acute pain                       [facility] Adult PT Treatment/Exercise - 03/20/19 0001      Knee/Hip Exercises: Stretches   Passive Hamstring Stretch  Right;Left;1 rep;30 seconds    Passive Hamstring Stretch Limitations  strap   cues to pull a big further into stretch   Hip Flexor Stretch  Left;2 reps;30 seconds    Hip Flexor Stretch Limitations  modified thomas position with strap    c/o L hip flexor and knee pain   Other Knee/Hip Stretches  butterfly stretch 2x30"      Knee/Hip Exercises: Aerobic   Tread Mill  2.0 mph x 6 min      Knee/Hip Exercises: Standing   Terminal Knee Extension  Strengthening;Left;1 set;10 reps    Terminal Knee Extension Limitations  TKE against ball 10x5"   c/o popping   Wall Squat  1 set;10 reps    Wall Squat Limitations  wall slide with ball squeeze   cues for foot positioning   Other Standing Knee Exercises  adductor towel slides 10x each LE   at counter top  Knee/Hip Exercises: Supine   Bridges with Diona Foley Squeeze  Strengthening;Both;1 set;15 reps   pain in L hip   Other Supine Knee/Hip Exercises  ant/pos pelvic tilts x10   cues to avoid straining   Other Supine Knee/Hip Exercises  TrA contraction + alt marching x20   discomfort in L hip     Modalities   Modalities  --      Iontophoresis   Type of Iontophoresis  --             PT Education - 03/20/19 1347    Education Details  edu on iontophoresis use, wear time, precautions    Person(s) Educated  Patient    Methods  Explanation;Demonstration;Tactile cues;Verbal cues;Handout    Comprehension  Verbalized understanding       PT Short Term Goals - 03/13/19 1314      PT SHORT TERM GOAL #1   Title  Patient to be independent with initial HEP    Time  3    Period  Weeks    Status  Achieved    Target Date  03/11/19        PT Long Term Goals - 03/13/19 1314      PT LONG TERM GOAL #1   Title  Patient to be  independent with advanced HEP     Time  6    Period  Weeks    Status  Partially Met      PT LONG TERM GOAL #2   Title  Patient to demonstrate L hip AROM WFL and without pain limiting.     Time  6    Period  Weeks    Status  Partially Met   03/13/19: Pt. able to demo progress with nearly all L knee AROM however noted pain with end range IR     PT LONG TERM GOAL #3   Title  Patient to demonstrate L knee AROM/PROM [facility] and without pain limiting.     Time  6    Period  Weeks    Status  Partially Met   Pt. able to demo L knee AROM -2-133 dg, L knee PROM -2-135 dg however noted L knee pain with end range ext     PT LONG TERM GOAL #4   Title  Patient to demonstrate stair climbing up/down 13 steps with 1 handrail as needed with good stability and no c/o pain.    Time  6    Period  Weeks    Status  On-going   03/13/19: pt. noting L knee pain on eccentric lowering and limited control of descnending     PT LONG TERM GOAL #5   Title  Patient to return to walking 5 miles without pain limiting.     Time  6    Period  Weeks    Status  Partially Met   03/13/19: Pt. noting able to walk 1-2 miles at this point however ache pain next day           Plan - 03/20/19 1348    Clinical Impression Statement  Patient arrived to session with no new complaints. Introduced mini wall squats with patient reporting pain in L knee. Also with report of "popping" in L knee with standing TKE into ball. Worked on adductor strengthening- patient reporting muscle fatigue but able to continue. Allowed for gentle LE stretching to help ease pain- patient tolerating hip flexor stretch well, but with hip flexor pain while bringing LE back up to table.  Also with report of sharp "lightening bolt of pain" in the L knee. Spoke with patient about use of iontophoresis to L hip and knee, held off today d/t patient's report of allergy to Prednisone. Advised patient to discuss possible Dexamethasone reaction with her MD today-  patient agreeable. Ended session with gentle core activation and lumbopelvic ROM. Patient declined modalities at end of session and with no further complaints.    Comorbidities  migraine, anxiety, L wrist surgery    PT Treatment/Interventions  ADLs/Self Care Home Management;Cryotherapy;Electrical Stimulation;Iontophoresis 8m/ml Dexamethasone;Functional mobility training;Stair training;Gait training;Ultrasound;Moist Heat;Therapeutic activities;Therapeutic exercise;Balance training;Neuromuscular re-education;Patient/family education;Orthotic Fit/Training;Passive range of motion;Manual techniques;Dry needling;Energy conservation;Taping;Vasopneumatic Device    PT Next Visit Plan  possible Iontophoreis? Gentle strengthening as tolerated; modalities as indicated    Consulted and Agree with Plan of Care  Patient       Patient will benefit from skilled therapeutic intervention in order to improve the following deficits and impairments:  Decreased activity tolerance, Decreased strength, Pain, Difficulty walking, Decreased range of motion, Postural dysfunction, Impaired flexibility  Visit Diagnosis: 1. Acute pain of left knee   2. Pain in left hip   3. Difficulty in walking, not elsewhere classified   4. Other symptoms and signs involving the musculoskeletal system        Problem List Patient Active Problem List   Diagnosis Date Noted  . Left knee pain 03/04/2019  . Left groin pain 03/04/2019  . Low back pain 03/04/2019  . HLD (hyperlipidemia) 10/21/2018  . Chronic migraine 04/23/2017  . Rash 04/19/2017  . Arm DVT (deep venous thromboembolism), acute, right ([place] 02/22/2017  . Occipital headache 07/17/2013  . Impingement syndrome of right shoulder 03/04/2013  . Preventative health care 09/24/2012  . Essential hypertension 06/16/2009  . Anxiety state 05/23/2009  . DEPRESSION 05/23/2009  . INSOMNIA-SLEEP DISORDER-UNSPEC 05/19/2009  . Migraine with intractable migraine, so stated, with  status migrainosus 04/07/2009  . ALOPECIA 11/30/2008    YJanene Harvey PT, DPT 03/20/19 1:52 PM   [facility] [address] [place] [address]Phone: [phone]  Fax:  [phone] Name: Lauren HAZELBAKERMRN: [id number] of Birth: 9Sep 13, 1977

## 2019-03-20 NOTE — Patient Instructions (Signed)

## 2019-03-20 NOTE — Assessment & Plan Note (Signed)
Foot pain irritation.  I think it is secondary to the fall.  He likely will take multiple weeks to completely resolved.  Possible small contusion is noted inferior to the patella.  Once weekly vitamin D given.  Discussed icing regimen and home exercises.  Topical anti-inflammatories given as well.  Patient also given anxious for right brace.Follow-up again in 4 to 6 weeks

## 2019-03-25 ENCOUNTER — Other Ambulatory Visit: Payer: Self-pay | Admitting: Obstetrics and Gynecology

## 2019-03-25 ENCOUNTER — Other Ambulatory Visit: Payer: Self-pay

## 2019-03-25 ENCOUNTER — Ambulatory Visit
Admission: RE | Admit: 2019-03-25 | Discharge: 2019-03-25 | Disposition: A | Discharge status: Home / Self Care | Payer: 59 | Source: Ambulatory Visit | Attending: Obstetrics and Gynecology | Admitting: Obstetrics and Gynecology

## 2019-03-25 DIAGNOSIS — R928 Other abnormal and inconclusive findings on diagnostic imaging of breast: Secondary | ICD-10-CM

## 2019-03-25 DIAGNOSIS — N6320 Unspecified lump in the left breast, unspecified quadrant: Secondary | ICD-10-CM

## 2019-03-27 ENCOUNTER — Other Ambulatory Visit: Payer: Self-pay

## 2019-03-27 ENCOUNTER — Ambulatory Visit: Payer: 59

## 2019-03-27 DIAGNOSIS — R262 Difficulty in walking, not elsewhere classified: Secondary | ICD-10-CM

## 2019-03-27 DIAGNOSIS — M25562 Pain in left knee: Secondary | ICD-10-CM

## 2019-03-27 DIAGNOSIS — M25552 Pain in left hip: Secondary | ICD-10-CM

## 2019-03-27 DIAGNOSIS — R29898 Other symptoms and signs involving the musculoskeletal system: Secondary | ICD-10-CM

## 2019-03-27 NOTE — Therapy (Signed)
[facility] [address]  [place], [place], [address] Phone: [phone]   Fax:  [phone]  Physical Therapy Treatment  Patient Details  Name: Lauren Wolfe MRN: [id number] Date of Birth: 03-04-1976 Referring Provider (PT): Karlton Lemon, MD   Encounter Date: 03/27/2019  PT End of Session - 03/27/19 1313    Visit Number  6    Number of Visits  7    Date for PT Re-Evaluation  04/01/19    Authorization Type  UHC    PT Start Time  1303    PT Stop Time  1353    PT Time Calculation (min)  50 min    Activity Tolerance  Patient tolerated treatment well;Patient limited by pain    Behavior During Therapy  [facility] for tasks assessed/performed       Past Medical History:  Diagnosis Date  . Anxiety   . Arm vein blood clot, right   . Migraine   . Wears glasses    and contacts    Past Surgical History:  Procedure Laterality Date  . BREAST BIOPSY  10/31/2012   Procedure: BREAST BIOPSY WITH NEEDLE LOCALIZATION;  Surgeon: Rolm Bookbinder, MD;  Location: [place];  Service: General;  Laterality: Right;  right breast wire guided excisional biopsy  . BREAST EXCISIONAL BIOPSY Right 2014  . ganglion cysts  2011  . WRIST SURGERY Left     There were no vitals filed for this visit.  Subjective Assessment - 03/27/19 1306    Subjective  Pt. noting increased L knee pain after prolonged walk performed yesterday evening.    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 Patellar partial-thickness cartilage loss. Focal abnormal edema in Hoffa's fat pad    Patient Stated Goals  "I just want to be able to get back to walking"    Currently in Pain?  Yes    Pain Location  Knee    Pain  Orientation  Anterior;Left    Pain Descriptors / Indicators  Aching    Pain Type  Acute pain    Multiple Pain Sites  No                       [facility] Adult PT Treatment/Exercise - 03/27/19 0001      Knee/Hip Exercises: Stretches   Passive Hamstring Stretch  Right;Left;1 rep;30 seconds    Passive Hamstring Stretch Limitations  strap    ITB Stretch  Left;1 rep;30 seconds    ITB Stretch Limitations  with strap       Knee/Hip Exercises: Aerobic   Nustep  Lvl 3, 6 min (UE/LE)      Knee/Hip Exercises: Supine   Bridges with [employer]  Both;15 reps    Straight Leg Raises  Left;10 reps      Knee/Hip Exercises: Sidelying   Hip ABduction  Left;Right;10 reps    Hip ABduction Limitations  L only     Hip ADduction  Left;Right;1 set   x 12 reps      Iontophoresis   Type of Iontophoresis  Dexamethasone    Location  L anterior knee     Dose  1.19m, 851mmin    Time  4-6 hours wear time  PT Education - 03/27/19 1533    Education Details  Pt. issued ionto education sheet last visit and verbalized understanding of this today including proper wear time, precautions and contraindications    Person(s) Educated  Patient    Methods  Explanation;Demonstration;Verbal cues;Handout    Comprehension  Verbalized understanding;Returned demonstration;Tactile cues required       PT Short Term Goals - 03/13/19 1314      PT SHORT TERM GOAL #1   Title  Patient to be independent with initial HEP    Time  3    Period  Weeks    Status  Achieved    Target Date  03/11/19        PT Long Term Goals - 03/13/19 1314      PT LONG TERM GOAL #1   Title  Patient to be independent with advanced HEP     Time  6    Period  Weeks    Status  Partially Met      PT LONG TERM GOAL #2   Title  Patient to demonstrate L hip AROM WFL and without pain limiting.     Time  6    Period  Weeks    Status  Partially Met   03/13/19: Pt. able to demo progress with nearly all L knee  AROM however noted pain with end range IR     PT LONG TERM GOAL #3   Title  Patient to demonstrate L knee AROM/PROM [facility] and without pain limiting.     Time  6    Period  Weeks    Status  Partially Met   Pt. able to demo L knee AROM -2-133 dg, L knee PROM -2-135 dg however noted L knee pain with end range ext     PT LONG TERM GOAL #4   Title  Patient to demonstrate stair climbing up/down 13 steps with 1 handrail as needed with good stability and no c/o pain.    Time  6    Period  Weeks    Status  On-going   03/13/19: pt. noting L knee pain on eccentric lowering and limited control of descnending     PT LONG TERM GOAL #5   Title  Patient to return to walking 5 miles without pain limiting.     Time  6    Period  Weeks    Status  Partially Met   03/13/19: Pt. noting able to walk 1-2 miles at this point however ache pain next day           Plan - 03/27/19 1315    Clinical Impression Statement  Pt. noting she walked ~ 1 mile yesterday and had increased L knee pain following this.  L anterior knee pain interfering with strengthening therex today thus remained conservative with proximal hip and LE strengthening activities.  Pt. verbalizing that she wishes to attempt trial of iontophoresis to L anterior knee and ionto order signed by MD.  Pt. also mentioned that MD verbalized approval of pt. plans to receive iontophoresis in therapy session at recent f/u on 03/20/19.  Will plan to monitor response to ionto and progress per pt. tolerance in coming session.    Personal Factors and Comorbidities  Age;Past/Current Experience;Comorbidity 2;Time since onset of injury/illness/exacerbation    Rehab Potential  Good    PT Treatment/Interventions  ADLs/Self Care Home Management;Cryotherapy;Electrical Stimulation;Iontophoresis 42m/ml Dexamethasone;Functional mobility training;Stair training;Gait training;Ultrasound;Moist Heat;Therapeutic activities;Therapeutic exercise;Balance training;Neuromuscular  re-education;Patient/family education;Orthotic Fit/Training;Passive range of motion;Manual techniques;Dry  needling;Energy conservation;Taping;Vasopneumatic Device    PT Next Visit Plan  possible Iontophoreis? Gentle strengthening as tolerated; modalities as indicated    Consulted and Agree with Plan of Care  Patient       Patient will benefit from skilled therapeutic intervention in order to improve the following deficits and impairments:  Decreased activity tolerance, Decreased strength, Pain, Difficulty walking, Decreased range of motion, Postural dysfunction, Impaired flexibility  Visit Diagnosis: 1. Acute pain of left knee   2. Pain in left hip   3. Difficulty in walking, not elsewhere classified   4. Other symptoms and signs involving the musculoskeletal system        Problem List Patient Active Problem List   Diagnosis Date Noted  . Hypertrophy, fat pad, infrapatellar 03/20/2019  . Left knee pain 03/04/2019  . Left groin pain 03/04/2019  . Low back pain 03/04/2019  . HLD (hyperlipidemia) 10/21/2018  . Chronic migraine 04/23/2017  . Rash 04/19/2017  . Arm DVT (deep venous thromboembolism), acute, right ([place]) 02/22/2017  . Occipital headache 07/17/2013  . Impingement syndrome of right shoulder 03/04/2013  . Preventative health care 09/24/2012  . Essential hypertension 06/16/2009  . Anxiety state 05/23/2009  . DEPRESSION 05/23/2009  . INSOMNIA-SLEEP DISORDER-UNSPEC 05/19/2009  . Migraine with intractable migraine, so stated, with status migrainosus 04/07/2009  . ALOPECIA 11/30/2008    Bess Harvest, PTA 03/27/19 3:40 PM   [facility] [address]  [place], [place], [address] Phone: [phone]   Fax:  [phone]  Name: Lauren Wolfe MRN: [id number] Date of Birth: 03/01/1976

## 2019-04-03 ENCOUNTER — Ambulatory Visit: Payer: 59 | Admitting: Physical Therapy

## 2019-04-08 ENCOUNTER — Ambulatory Visit: Payer: 59 | Attending: Family Medicine | Admitting: Physical Therapy

## 2019-04-08 ENCOUNTER — Encounter: Payer: Self-pay | Admitting: Physical Therapy

## 2019-04-08 ENCOUNTER — Other Ambulatory Visit: Payer: Self-pay

## 2019-04-08 DIAGNOSIS — R29898 Other symptoms and signs involving the musculoskeletal system: Secondary | ICD-10-CM | POA: Diagnosis present

## 2019-04-08 DIAGNOSIS — M25562 Pain in left knee: Secondary | ICD-10-CM | POA: Insufficient documentation

## 2019-04-08 DIAGNOSIS — M25552 Pain in left hip: Secondary | ICD-10-CM | POA: Insufficient documentation

## 2019-04-08 DIAGNOSIS — R262 Difficulty in walking, not elsewhere classified: Secondary | ICD-10-CM

## 2019-04-08 NOTE — Therapy (Addendum)
[facility] [address]  [place], [place], [address] Phone: [phone]   Fax:  [phone]  Physical Therapy Treatment  Patient Details  Name: Lauren Wolfe MRN: [id number] Date of Birth: 1976-01-16 Referring Provider (PT): Karlton Lemon, MD   Encounter Date: 04/08/2019  PT End of Session - 04/08/19 1750    Visit Number  7    Number of Visits  7    Date for PT Re-Evaluation  04/01/19    Authorization Type  [employer]    PT Start Time  7124   pt late   PT Stop Time  1744    PT Time Calculation (min)  33 min    Activity Tolerance  Patient tolerated treatment well    Behavior During Therapy  [facility] for tasks assessed/performed       Past Medical History:  Diagnosis Date  . Anxiety   . Arm vein blood clot, right   . Migraine   . Wears glasses    and contacts    Past Surgical History:  Procedure Laterality Date  . BREAST BIOPSY  10/31/2012   Procedure: BREAST BIOPSY WITH NEEDLE LOCALIZATION;  Surgeon: Rolm Bookbinder, MD;  Location: [facility];  Service: General;  Laterality: Right;  right breast wire guided excisional biopsy  . BREAST EXCISIONAL BIOPSY Right 2014  . ganglion cysts  2011  . WRIST SURGERY Left     There were no vitals filed for this visit.  Subjective Assessment - 04/08/19 1711    Subjective  Reports that she is feeling okay today- no pain since Thursday. Feels that the vitamin D pills are helping. Reports not benefit from iontophoresis.Has been walking every day at lunch and went to [facility] with mild pain.    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 Patellar partial-thickness cartilage loss. Focal abnormal edema in Hoffa's fat pad     Patient Stated Goals  "I just want to be able to get back to walking"    Currently in Pain?  No/denies         [facility] PT Assessment - 04/08/19 0001      AROM   Left Hip Flexion  119    Left Hip External Rotation   46    Left Hip Internal Rotation   41    Left Hip ABduction  30    Left Knee Extension  -1    Left Knee Flexion  139                   [facility] Adult PT Treatment/Exercise - 04/08/19 0001      Ambulation/Gait   Stairs  Yes    Stairs Assistance  7: Independent    Stair Management Technique  No rails    Number of Stairs  13    Height of Stairs  8    Gait Comments  quickly ascending/descending with poor quad control when descending steps; no pain      Knee/Hip Exercises: Stretches   Hip Flexor Stretch  Left;2 reps;30 seconds    Hip Flexor Stretch Limitations  modified thomas position with strap       Knee/Hip Exercises: Standing   Functional Squat  1 set;10 reps    Functional Squat  Limitations  at counter    Other Standing Knee Exercises  monster walks with red TB around ankles 2x33f      Knee/Hip Exercises: Supine   Bridges  Strengthening;Both;1 set;10 reps    Bridges Limitations  straight leg bridge on orange pball    Single Leg Bridge  Strengthening;Both;1 set;10 reps   marching bridge   Straight Leg Raises  Left;10 reps;Strengthening    Straight Leg Raises Limitations  2#; quick and poor control      Knee/Hip Exercises: Prone   Hip Extension  Strengthening;Right;Left;1 set;10 reps    Hip Extension Limitations  prone donkey kicks   cues to avoid use of momentum            PT Education - 04/08/19 1748    Education Details  update to HEP; discussion on objective progress with PT    Person(s) Educated  Patient    Methods  Explanation;Demonstration;Tactile cues;Verbal cues;Handout    Comprehension  Verbalized understanding;Returned demonstration       PT Short Term Goals - 04/08/19 1714      PT SHORT TERM GOAL #1   Title  Patient to  be independent with initial HEP    Time  3    Period  Weeks    Status  Achieved    Target Date  03/11/19        PT Long Term Goals - 04/08/19 1714      PT LONG TERM GOAL #1   Title  Patient to be independent with advanced HEP     Time  6    Period  Weeks    Status  Partially Met   report noncompliance with HEP     PT LONG TERM GOAL #2   Title  Patient to demonstrate L hip AROM WFL and without pain limiting.     Time  6    Period  Weeks    Status  Achieved   demonstrated improvements in L hip flexion and IR AROM     PT LONG TERM GOAL #3   Title  Patient to demonstrate [facility] pain limiting.     Time  6    Period  Weeks    Status  Achieved   improvement in L knee flexion AROM without pain     PT LONG TERM GOAL #4   Title  Patient to demonstrate stair climbing up/down 13 steps with 1 handrail as needed with good stability and no c/o pain.    Time  6    Period  Weeks    Status  Partially Met   demonstrating poor quad and hip control when descending steps quickly, but reporting no pain.     PT LONG TERM GOAL #5   Title  Patient to return to walking 5 miles without pain limiting.     Time  6    Period  Weeks    Status  Partially Met   reporting >5,000 steps with variable pain levels           Plan - 04/08/19 1750    Clinical Impression Statement  Patient arrived to session with report of dramatic improvement in L knee and hip pain since last Thursday. Notes that she has been taking vitamin D supplements given to her by MD and believes this is helping. Patient has demonstrated improvements in L hip flexion and IR AROM. Also with improvement in L knee flexion AROM without pain. Patient today demonstrating  poor quad and hip control when descending steps quickly, but reporting no pain. Worked on progressive LE strengthening with increased challenge today d/t patient's low pain levels. Patient requiring intermittent cues to correct form. Able to  perform squats to ~90 degrees without complaints. Updated HEP with exercises that were well-tolerated today. Patient reported understanding. No complaints at end of session. Patient is showing significant improvement in symptoms without known cause. Placing patient on 30 day hold-explained that she is welcome to return if flare up occurs.    Comorbidities  migraine, anxiety, L wrist surgery    PT Treatment/Interventions  ADLs/Self Care Home Management;Cryotherapy;Electrical Stimulation;Iontophoresis 77m/ml Dexamethasone;Functional mobility training;Stair training;Gait training;Ultrasound;Moist Heat;Therapeutic activities;Therapeutic exercise;Balance training;Neuromuscular re-education;Patient/family education;Orthotic Fit/Training;Passive range of motion;Manual techniques;Dry needling;Energy conservation;Taping;Vasopneumatic Device    PT Next Visit Plan  30 day hold    Consulted and Agree with Plan of Care  Patient       Patient will benefit from skilled therapeutic intervention in order to improve the following deficits and impairments:  Decreased activity tolerance, Decreased strength, Pain, Difficulty walking, Decreased range of motion, Postural dysfunction, Impaired flexibility  Visit Diagnosis: 1. Acute pain of left knee   2. Pain in left hip   3. Difficulty in walking, not elsewhere classified   4. Other symptoms and signs involving the musculoskeletal system        Problem List Patient Active Problem List   Diagnosis Date Noted  . Hypertrophy, fat pad, infrapatellar 03/20/2019  . Left knee pain 03/04/2019  . Left groin pain 03/04/2019  . Low back pain 03/04/2019  . HLD (hyperlipidemia) 10/21/2018  . Chronic migraine 04/23/2017  . Rash 04/19/2017  . Arm DVT (deep venous thromboembolism), acute, right (HLuling 02/22/2017  . Occipital headache 07/17/2013  . Impingement syndrome of right shoulder 03/04/2013  . Preventative health care 09/24/2012  . Essential hypertension 06/16/2009   . Anxiety state 05/23/2009  . DEPRESSION 05/23/2009  . INSOMNIA-SLEEP DISORDER-UNSPEC 05/19/2009  . Migraine with intractable migraine, so stated, with status migrainosus 04/07/2009  . ALOPECIA 11/30/2008    YJanene Harvey PT, DPT 04/08/19 5:59 PM   [facility] [address] [place] [address]Phone: [phone]  Fax:  [phone] Name: Lauren FRANSONMRN: [id number] of Birth: 931-Jan-1977 PHYSICAL THERAPY DISCHARGE SUMMARY  Visits from Start of Care: 7  Current functional level related to goals / functional outcomes: See above clinical impression   Remaining deficits: Decreased hip and quad stability with stairs    Education / Equipment: HEP  Plan: Patient agrees to discharge.  Patient goals were partially met. Patient is being discharged due to being pleased with the current functional level.  ?????     YJanene Harvey PT, DPT 05/15/19 4:04 PM

## 2019-04-15 ENCOUNTER — Ambulatory Visit: Payer: 59 | Admitting: Physical Therapy

## 2019-04-17 ENCOUNTER — Ambulatory Visit (INDEPENDENT_AMBULATORY_CARE_PROVIDER_SITE_OTHER): Payer: 59 | Admitting: Family Medicine

## 2019-04-17 ENCOUNTER — Encounter: Payer: Self-pay | Admitting: Family Medicine

## 2019-04-17 ENCOUNTER — Other Ambulatory Visit: Payer: Self-pay

## 2019-04-17 DIAGNOSIS — M794 Hypertrophy of (infrapatellar) fat pad: Secondary | ICD-10-CM

## 2019-04-17 NOTE — Patient Instructions (Signed)
Exercise 2 times a week 2,000-4,000 IU vitamin d when you finish the once weekly See me again as needed

## 2019-04-17 NOTE — Progress Notes (Signed)
[facility] [place], [place] [address] Phone: [phone] Subjective:   I [name] am serving as a Education administrator for Dr. Hulan Saas.  I'm seeing this patient by the request  of:  Biagio Borg, MD   CC: Left groin pain after fall follow-up  [id number]   03/20/2019 Patient is not having any significant discomfort or pain at the moment.  Does have some mild discomfort but I do not think it is likely contributing to anything intra-articular.  Likely more secondary to compensation to the knee.  Follow-up again in 4 to 6 weeks  Foot pain irritation.  I think it is secondary to the fall.  He likely will take multiple weeks to completely resolved.  Possible small contusion is noted inferior to the patella.  Once weekly vitamin D given.  Discussed icing regimen and home exercises.  Topical anti-inflammatories given as well.  Patient also given anxious for right brace.Follow-up again in 4 to 6 weeks  04/17/2019 Tyshana S Osterman is a 43 y.o. female coming in with complaint of knee pain. States she is doing better. PT ended last week.  Patient states that she is feeling 95% better.  Feels the vitamin D has helped significantly with the pain as well as with muscle strength.  Feels like she is not having any difficulty with regular daily activities anymore.     Past Medical History:  Diagnosis Date  . Anxiety   . Arm vein blood clot, right   . Migraine   . Wears glasses    and contacts   Past Surgical History:  Procedure Laterality Date  . BREAST BIOPSY  10/31/2012   Procedure: BREAST BIOPSY WITH NEEDLE LOCALIZATION;  Surgeon: Rolm Bookbinder, MD;  Location: [facility];  Service: General;  Laterality: Right;  right breast wire guided excisional biopsy  . BREAST EXCISIONAL BIOPSY Right 2014  . ganglion cysts  2011  . WRIST SURGERY Left    Social History   Socioeconomic History  . Marital status: Single    Spouse name: Not  on file  . Number of children: 1  . Years of education: some college  . Highest education level: Not on file  Occupational History  . Occupation: cust service rep w/ [employer]  . Financial resource strain: Not on file  . Food insecurity    Worry: Not on file    Inability: Not on file  . Transportation needs    Medical: Not on file    Non-medical: Not on file  Tobacco Use  . Smoking status: Never Smoker  . Smokeless tobacco: Never Used  Substance and Sexual Activity  . Alcohol use: No  . Drug use: No  . Sexual activity: Yes    Birth control/protection: Pill  Lifestyle  . Physical activity    Days per week: Not on file    Minutes per session: Not on file  . Stress: Not on file  Relationships  . Social Herbalist on phone: Not on file    Gets together: Not on file    Attends religious service: Not on file    Active member of club or organization: Not on file    Attends meetings of clubs or organizations: Not on file    Relationship status: Not on file  Other Topics Concern  . Not on file  Social History Narrative   Lives at home with her daughter.   Right-handed.  No caffeine use.   Allergies  Allergen Reactions  . Azithromycin Shortness Of Breath    REACTION: rash, prurutis  . Codeine Shortness Of Breath    Causes shortness of breath, rash, itching  . Hydrocodone Shortness Of Breath    Rash, itching  . Ketorolac Tromethamine Shortness Of Breath    Itching, rash  . Prednisone     SOB  . Tramadol     itching   Family History  Problem Relation Age of Onset  . Hypertension Mother   . Aneurysm Mother   . Stomach cancer Father   . Other Other        grandparent with DJD  . Hypertension Other        grandparent  . Diabetes Other        grandparent  . Gout Other        grandfather  . Breast cancer Other        grandmother  . Migraines Paternal Aunt   . Breast cancer Cousin   . Breast cancer Maternal Grandmother     Current  Outpatient Medications (Endocrine & Metabolic):  .  levonorgestrel (MIRENA) 20 MCG/24HR IUD, 1 each by Intrauterine route once.    Current Outpatient Medications (Analgesics):  Marland Kitchen  Galcanezumab-gnlm (EMGALITY) 120 MG/ML SOAJ, Inject 120 mg into the skin every 30 (thirty) days. Marland Kitchen  ibuprofen (ADVIL) 600 MG tablet, TAKE 1 TABLET (600 MG TOTAL) BY MOUTH EVERY 8 (EIGHT) HOURS. .  indomethacin (INDOCIN) 25 MG capsule, TAKE ONE CAPSULE BY MOUTH 3 TIMES A DAY AS NEEDED MUST LAST 30 DAYS .  SUMAtriptan (IMITREX) 6 MG/0.5ML SOLN injection, One injection at onset of migraine.  May repeat in 2 hrs, if needed.  Max dose: 2 inj/day. This is a 30 day prescription. .  traMADol (ULTRAM) 50 MG tablet, Take 1 tablet (50 mg total) by mouth every 6 (six) hours as needed.   Current Outpatient Medications (Other):  Marland Kitchen  Diclofenac Sodium (PENNSAID) 2 % SOLN, Place 2 g onto the skin 2 (two) times daily. .  ondansetron (ZOFRAN-ODT) 8 MG disintegrating tablet, TAKE 1 TABLET (8 MG TOTAL) BY MOUTH EVERY 8 (EIGHT) HOURS AS NEEDED FOR NAUSEA OR VOMITING. Marland Kitchen  tiZANidine (ZANAFLEX) 4 MG tablet, TAKE ONE TABLET BY MOUTH THREE TIMES A DAY AS NEEDED FOR MIGRAINES FOR 30 DAYS .  triamcinolone cream (KENALOG) 0.1 %, Apply 1 application topically 2 (two) times daily. .  Vitamin D, Ergocalciferol, (DRISDOL) 1.25 MG (50000 UT) CAPS capsule, Take 1 capsule (50,000 Units total) by mouth every 7 (seven) days.    Past medical history, social, surgical and family history all reviewed in electronic medical record.  No pertanent information unless stated regarding to the chief complaint.   Review of Systems:  No headache, visual changes, nausea, vomiting, diarrhea, constipation, dizziness, abdominal pain, skin rash, fevers, chills, night sweats, weight loss, swollen lymph nodes, body aches, joint swelling, muscle aches, chest pain, shortness of breath, mood changes.   Objective  Blood pressure 130/80, pulse 81, height 5' 6"  (1.676 m),  weight 218 lb (98.9 kg), SpO2 99 %.    General: No apparent distress alert and oriented x3 mood and affect normal, dressed appropriately.  HEENT: Pupils equal, extraocular movements intact  Respiratory: Patient's speak in full sentences and does not appear short of breath  Cardiovascular: No lower extremity edema, non tender, no erythema  Skin: Warm dry intact with no signs of infection or rash on extremities or on axial skeleton.  Abdomen:  Soft nontender  Neuro: Cranial nerves II through XII are intact, neurovascularly intact in all extremities with 2+ DTRs and 2+ pulses.  Lymph: No lymphadenopathy of posterior or anterior cervical chain or axillae bilaterally.  Gait normal with good balance and coordination.  MSK:  Non tender with full range of motion and good stability and symmetric strength and tone of shoulders, elbows, wrist, hip, knee and ankles bilaterally.  Knee: Left Normal to inspection with no erythema or effusion or obvious bony abnormalities. Palpation normal with no warmth, joint line tenderness, patellar tenderness, or condyle tenderness. ROM full in flexion and extension and lower leg rotation. Ligaments with solid consistent endpoints including ACL, PCL, LCL, MCL. Negative Mcmurray's, Apley's, and Thessalonian tests. Non painful patellar compression. Patellar glide without crepitus. Patellar and quadriceps tendons unremarkable. Hamstring and quadriceps strength is normal. Contralateral knee unremarkable    Impression and Recommendations:    . The above documentation has been reviewed and is accurate and complete Lyndal Pulley, DO       Note: This dictation was prepared with Dragon dictation along with smaller phrase technology. Any transcriptional errors that result from this process are unintentional.

## 2019-04-17 NOTE — Assessment & Plan Note (Signed)
Doing well at this time.  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  Did great with physical therapy.  Follow-up again as needed

## 2019-04-20 ENCOUNTER — Emergency Department (HOSPITAL_BASED_OUTPATIENT_CLINIC_OR_DEPARTMENT_OTHER): Payer: 59

## 2019-04-20 ENCOUNTER — Encounter (HOSPITAL_BASED_OUTPATIENT_CLINIC_OR_DEPARTMENT_OTHER): Payer: Self-pay

## 2019-04-20 ENCOUNTER — Other Ambulatory Visit: Payer: Self-pay

## 2019-04-20 ENCOUNTER — Emergency Department (HOSPITAL_BASED_OUTPATIENT_CLINIC_OR_DEPARTMENT_OTHER)
Admission: EM | Admit: 2019-04-20 | Discharge: 2019-04-21 | Disposition: A | Discharge status: Home / Self Care | Payer: 59 | Attending: Emergency Medicine | Admitting: Emergency Medicine

## 2019-04-20 DIAGNOSIS — R0789 Other chest pain: Secondary | ICD-10-CM | POA: Insufficient documentation

## 2019-04-20 DIAGNOSIS — R072 Precordial pain: Secondary | ICD-10-CM

## 2019-04-20 DIAGNOSIS — Z79899 Other long term (current) drug therapy: Secondary | ICD-10-CM | POA: Diagnosis not present

## 2019-04-20 DIAGNOSIS — R079 Chest pain, unspecified: Secondary | ICD-10-CM | POA: Diagnosis present

## 2019-04-20 NOTE — ED Provider Notes (Signed)
[facility] EMERGENCY DEPARTMENT Provider Note   CSN: [id number] Arrival date & time: 04/20/19  2247     History   Chief Complaint Chief Complaint  Patient presents with  . Chest Pain    HPI Lauren Wolfe is a 43 y.o. female.     Patient c/o intermittent mid to left chest pain in the past few weeks. Symptoms generally occur at rest, episodic, lasts seconds to hours. Not related to eating or exertion. No positional change. Denies heartburn/gerd. Denies chest wall injury or strain. No cough or uri symptoms. No pleuritic pain. No leg pain or swelling. Denies fam hx premature cad. No recent surgery, immobility, trauma or travel. Denies unusual doe or fatigue. No known covid+ exposure.   The history is provided by the patient.  Chest Pain Associated symptoms: no abdominal pain, no back pain, no cough, no fever, no headache, no nausea, no palpitations, no shortness of breath and no vomiting     Past Medical History:  Diagnosis Date  . Anxiety   . Arm vein blood clot, right   . Migraine   . Wears glasses    and contacts    Patient Active Problem List   Diagnosis Date Noted  . Hypertrophy, fat pad, infrapatellar 03/20/2019  . Left knee pain 03/04/2019  . Left groin pain 03/04/2019  . Low back pain 03/04/2019  . HLD (hyperlipidemia) 10/21/2018  . Chronic migraine 04/23/2017  . Rash 04/19/2017  . Arm DVT (deep venous thromboembolism), acute, right ([place]) 02/22/2017  . Occipital headache 07/17/2013  . Impingement syndrome of right shoulder 03/04/2013  . Preventative health care 09/24/2012  . Essential hypertension 06/16/2009  . Anxiety state 05/23/2009  . DEPRESSION 05/23/2009  . INSOMNIA-SLEEP DISORDER-UNSPEC 05/19/2009  . Migraine with intractable migraine, so stated, with status migrainosus 04/07/2009  . ALOPECIA 11/30/2008    Past Surgical History:  Procedure Laterality Date  . BREAST BIOPSY  10/31/2012   Procedure: BREAST BIOPSY WITH NEEDLE  LOCALIZATION;  Surgeon: Rolm Bookbinder, MD;  Location: [facility];  Service: General;  Laterality: Right;  right breast wire guided excisional biopsy  . BREAST EXCISIONAL BIOPSY Right 2014  . ganglion cysts  2011  . WRIST SURGERY Left      OB History   No obstetric history on file.      Home Medications    Prior to Admission medications   Medication Sig Start Date End Date Taking? Authorizing Provider  Diclofenac Sodium (PENNSAID) 2 % SOLN Place 2 g onto the skin 2 (two) times daily. 03/20/19  Yes Hulan Saas M, DO  Galcanezumab-gnlm (EMGALITY) 120 MG/ML SOAJ Inject 120 mg into the skin every 30 (thirty) days. 11/29/18  Yes Suzzanne Cloud, NP  ibuprofen (ADVIL) 600 MG tablet TAKE 1 TABLET (600 MG TOTAL) BY MOUTH EVERY 8 (EIGHT) HOURS. 03/03/19  Yes Hudnall, Sharyn Lull, MD  indomethacin (INDOCIN) 25 MG capsule TAKE ONE CAPSULE BY MOUTH 3 TIMES A DAY AS NEEDED MUST LAST 30 DAYS 10/24/18  Yes Marcial Pacas, MD  levonorgestrel (MIRENA) 20 MCG/24HR IUD 1 each by Intrauterine route once.   Yes [provider]  ondansetron (ZOFRAN-ODT) 8 MG disintegrating tablet TAKE 1 TABLET (8 MG TOTAL) BY MOUTH EVERY 8 (EIGHT) HOURS AS NEEDED FOR NAUSEA OR VOMITING. 08/13/18  Yes Marcial Pacas, MD  triamcinolone cream (KENALOG) 0.1 % Apply 1 application topically 2 (two) times daily. 10/21/18 10/21/19 Yes Biagio Borg, MD  Vitamin D, Ergocalciferol, (DRISDOL) 1.25 MG (50000 UT) CAPS  capsule Take 1 capsule (50,000 Units total) by mouth every 7 (seven) days. 03/20/19  Yes Lyndal Pulley, DO  SUMAtriptan (IMITREX) 6 MG/0.5ML SOLN injection One injection at onset of migraine.  May repeat in 2 hrs, if needed.  Max dose: 2 inj/day. This is a 30 day prescription. 10/29/18   Marcial Pacas, MD  tiZANidine (ZANAFLEX) 4 MG tablet TAKE ONE TABLET BY MOUTH THREE TIMES A DAY AS NEEDED FOR MIGRAINES FOR 30 DAYS 01/06/19   Marcial Pacas, MD  traMADol (ULTRAM) 50 MG tablet Take 1 tablet (50 mg total) by mouth every 6  (six) hours as needed. 03/04/19   Biagio Borg, MD    Family History Family History  Problem Relation Age of Onset  . Hypertension Mother   . Aneurysm Mother   . Stomach cancer Father   . Other Other        grandparent with DJD  . Hypertension Other        grandparent  . Diabetes Other        grandparent  . Gout Other        grandfather  . Breast cancer Other        grandmother  . Migraines Paternal Aunt   . Breast cancer Cousin   . Breast cancer Maternal Grandmother     Social History Social History   Tobacco Use  . Smoking status: Never Smoker  . Smokeless tobacco: Never Used  Substance Use Topics  . Alcohol use: No  . Drug use: No     Allergies   Azithromycin, Codeine, Hydrocodone, Ketorolac tromethamine, Prednisone, and Tramadol   Review of Systems Review of Systems  Constitutional: Negative for fever.  HENT: Negative for sore throat.   Eyes: Negative for redness.  Respiratory: Negative for cough and shortness of breath.   Cardiovascular: Positive for chest pain. Negative for palpitations and leg swelling.  Gastrointestinal: Negative for abdominal pain, nausea and vomiting.  Genitourinary: Negative for flank pain.  Musculoskeletal: Negative for back pain and neck pain.  Skin: Negative for rash.  Neurological: Negative for headaches.  Hematological: Does not bruise/bleed easily.  Psychiatric/Behavioral: Negative for confusion.     Physical Exam Updated Vital Signs BP 125/79 (BP Location: Right Arm)   Pulse 85   Temp 98.5 F (36.9 C) (Oral)   Resp 19   Ht 1.676 m (5' 6" )   Wt 97.5 kg   SpO2 99%   BMI 34.70 kg/m   Physical Exam Vitals signs and nursing note reviewed.  Constitutional:      Appearance: Normal appearance. She is well-developed.  HENT:     Head: Atraumatic.     Nose: Nose normal.     Mouth/Throat:     Mouth: Mucous membranes are moist.  Eyes:     General: No scleral icterus.    Conjunctiva/sclera: Conjunctivae normal.   Neck:     Musculoskeletal: Normal range of motion and neck supple. No neck rigidity or muscular tenderness.     Trachea: No tracheal deviation.  Cardiovascular:     Rate and Rhythm: Normal rate and regular rhythm.     Pulses: Normal pulses.     Heart sounds: Normal heart sounds. No murmur. No friction rub. No gallop.   Pulmonary:     Effort: Pulmonary effort is normal. No respiratory distress.     Breath sounds: Normal breath sounds.     Comments: Mild chest wall tenderness, reproducing pain. No crepitus. No sts.  Chest:  Chest wall: Tenderness present.  Abdominal:     General: Bowel sounds are normal. There is no distension.     Palpations: Abdomen is soft.     Tenderness: There is no abdominal tenderness. There is no guarding.  Genitourinary:    Comments: No cva tenderness.  Musculoskeletal:        General: No swelling or tenderness.     Right lower leg: No edema.  Skin:    General: Skin is warm and dry.     Findings: No rash.  Neurological:     Mental Status: She is alert.     Comments: Alert, speech normal. Steady gait.   Psychiatric:        Mood and Affect: Mood normal.      ED Treatments / Results  Labs (all labs ordered are listed, but only abnormal results are displayed) Results for orders placed or performed during the hospital encounter of 04/20/19  Troponin I (High Sensitivity)  Result Value Ref Range   Troponin I (High Sensitivity) <2 <18 ng/L   Dg Chest 2 View  Result Date: 04/20/2019 CLINICAL DATA:  Chest pain. EXAM: CHEST - 2 VIEW COMPARISON:  None. FINDINGS: The heart size is normal. There is no pneumothorax. There is a hazy opacity overlying the right mid lung zone that is only appreciated on the frontal view. There is no pneumothorax or large pleural effusion. There is no acute osseous abnormality. IMPRESSION: 1. No definite acute cardiopulmonary process. 2. Subtle indistinct hazy opacity overlying the right mid lung zone, only well appreciated on  the frontal view. This is of unknown clinical significance but should be correlated with a follow-up two-view chest x-ray in 4-6 weeks. Electronically Signed   By: Constance Holster M.D.   On: 04/20/2019 23:13     EKG EKG Interpretation  Date/Time:  Sunday April 20 2019 22:58:32 EDT Ventricular Rate:  85 PR Interval:    QRS Duration: 88 QT Interval:  375 QTC Calculation: 446 R Axis:   39 Text Interpretation:  Sinus rhythm Nonspecific T wave abnormality Confirmed by Lajean Saver [id number]) on 04/20/2019 11:09:46 PM   Radiology Dg Chest 2 View  Result Date: 04/20/2019 CLINICAL DATA:  Chest pain. EXAM: CHEST - 2 VIEW COMPARISON:  None. FINDINGS: The heart size is normal. There is no pneumothorax. There is a hazy opacity overlying the right mid lung zone that is only appreciated on the frontal view. There is no pneumothorax or large pleural effusion. There is no acute osseous abnormality. IMPRESSION: 1. No definite acute cardiopulmonary process. 2. Subtle indistinct hazy opacity overlying the right mid lung zone, only well appreciated on the frontal view. This is of unknown clinical significance but should be correlated with a follow-up two-view chest x-ray in 4-6 weeks. Electronically Signed   By: Constance Holster M.D.   On: 04/20/2019 23:13    Procedures Procedures (including critical care time)  Medications Ordered in ED Medications - No data to display   Initial Impression / Assessment and Plan / ED Course  I have reviewed the triage vital signs and the nursing notes.  Pertinent labs & imaging results that were available during my care of the patient were reviewed by me and considered in my medical decision making (see chart for details).  Iv ns. Ecg. Cxr. Labs.   Reviewed nursing notes and prior charts for additional history.   Labs reviewed by me - hs trop neg - after symptoms present for several days, trop is negative.  cxr reviewed by me - no def pna. Pt denies cough,  chest cta. Will have f/u pcp repeat cxr in 4-6 weeks.   Pt currently comfortable, pain free, no increased wob, and appears stable for d/c.   Suspect msk chest pain, however, pt does not fam hx cad, so will also rec outpt card f/u for atypical cp.   Return precautions provided.     Final Clinical Impressions(s) / ED Diagnoses   Final diagnoses:  None    ED Discharge Orders    None       Lajean Saver, MD 04/21/19 [phone]

## 2019-04-20 NOTE — ED Triage Notes (Signed)
Pt c/o intermittent CP x 1 month. Pt describes the pain as sharp and is worse when she bends over. Pain into L arm. Pt reports L arm is swollen today. Pt states the pain has caused some mild ShOB. Pt denies nausea, diaphoresis.

## 2019-04-20 NOTE — ED Notes (Signed)
ED Provider at bedside. 

## 2019-04-21 ENCOUNTER — Ambulatory Visit (INDEPENDENT_AMBULATORY_CARE_PROVIDER_SITE_OTHER): Payer: 59 | Admitting: Internal Medicine

## 2019-04-21 ENCOUNTER — Encounter: Payer: Self-pay | Admitting: Internal Medicine

## 2019-04-21 DIAGNOSIS — R9389 Abnormal findings on diagnostic imaging of other specified body structures: Secondary | ICD-10-CM

## 2019-04-21 DIAGNOSIS — R079 Chest pain, unspecified: Secondary | ICD-10-CM

## 2019-04-21 DIAGNOSIS — I10 Essential (primary) hypertension: Secondary | ICD-10-CM | POA: Diagnosis not present

## 2019-04-21 LAB — TROPONIN I (HIGH SENSITIVITY): Troponin I (High Sensitivity): <2 ng/L (ref <18)

## 2019-04-21 MED ORDER — METHOCARBAMOL 750 MG PO TABS: 750.0000 mg | ORAL_TABLET | Freq: Three times a day (TID) | ORAL | 0 refills | Status: DC | PRN

## 2019-04-21 MED ORDER — ACETAMINOPHEN 325 MG PO TABS
650.0000 mg | ORAL_TABLET | Freq: Once | ORAL | Status: AC
  Administered 2019-04-21: 650 mg via ORAL
  Filled 2019-04-21: qty 2

## 2019-04-21 NOTE — Assessment & Plan Note (Signed)
Atypical, low suspicion for cardiac, declines stress testing for now as pain is resolved this evening

## 2019-04-21 NOTE — Progress Notes (Signed)
Patient ID: Lauren Wolfe, female   DOB: 1976-03-27, 43 y.o.   MRN: [id number]  Virtual Visit via Video Note  I connected with Lauren Wolfe on 04/21/19 at  7:40 PM EDT by a video enabled telemedicine application and verified that I am speaking with the correct person using two identifiers.  Location: Patient: at home Provider: at office   I discussed the limitations of evaluation and management by telemedicine and the availability of in person appointments. The patient expressed understanding and agreed to proceed.  History of Present Illness: 43yo F seen earlier today at ED with CP with negative ECG for ischemia, troponin neg x 1, but cxr cw ? Hazy focal abnormality.  Pt afeb, VSS, exam benign and pt discharged but asked to f/u here for the abnormal cxr.  Pt denies cough, fever,, increased sob or doe, wheezing, orthopnea, PND, increased LE swelling, palpitations, dizziness or syncope.  Does not feel ill overall.   Pt denies polydipsia, polyuria,  Pt denies fever, wt loss, night sweats, loss of appetite, or other constitutional symptoms Past Medical History:  Diagnosis Date  . Anxiety   . Arm vein blood clot, right   . Migraine   . Wears glasses    and contacts   Past Surgical History:  Procedure Laterality Date  . BREAST BIOPSY  10/31/2012   Procedure: BREAST BIOPSY WITH NEEDLE LOCALIZATION;  Surgeon: Rolm Bookbinder, MD;  Location: [facility];  Service: General;  Laterality: Right;  right breast wire guided excisional biopsy  . BREAST EXCISIONAL BIOPSY Right 2014  . ganglion cysts  2011  . WRIST SURGERY Left     reports that she has never smoked. She has never used smokeless tobacco. She reports that she does not drink alcohol or use drugs. family history includes Aneurysm in her mother; Breast cancer in her cousin, maternal grandmother, and another family member; Diabetes in an other family member; Gout in an other family member; Hypertension in her mother  and another family member; Migraines in her paternal aunt; Other in an other family member; Stomach cancer in her father. Allergies  Allergen Reactions  . Azithromycin Shortness Of Breath    REACTION: rash, prurutis  . Codeine Shortness Of Breath    Causes shortness of breath, rash, itching  . Hydrocodone Shortness Of Breath    Rash, itching  . Ketorolac Tromethamine Shortness Of Breath    Itching, rash  . Prednisone     SOB  . Tramadol     itching   Current Outpatient Medications on File Prior to Visit  Medication Sig Dispense Refill  . Diclofenac Sodium (PENNSAID) 2 % SOLN Place 2 g onto the skin 2 (two) times daily. 112 g 3  . Galcanezumab-gnlm (EMGALITY) 120 MG/ML SOAJ Inject 120 mg into the skin every 30 (thirty) days. 1 pen 5  . ibuprofen (ADVIL) 600 MG tablet TAKE 1 TABLET (600 MG TOTAL) BY MOUTH EVERY 8 (EIGHT) HOURS. 90 tablet 1  . indomethacin (INDOCIN) 25 MG capsule TAKE ONE CAPSULE BY MOUTH 3 TIMES A DAY AS NEEDED MUST LAST 30 DAYS 30 capsule 0  . levonorgestrel (MIRENA) 20 MCG/24HR IUD 1 each by Intrauterine route once.    . methocarbamol (ROBAXIN) 750 MG tablet Take 1 tablet (750 mg total) by mouth 3 (three) times daily as needed (muscle spasm/pain). 15 tablet 0  . ondansetron (ZOFRAN-ODT) 8 MG disintegrating tablet TAKE 1 TABLET (8 MG TOTAL) BY MOUTH EVERY 8 (EIGHT) HOURS AS NEEDED FOR NAUSEA  OR VOMITING. 20 tablet 2  . SUMAtriptan (IMITREX) 6 MG/0.5ML SOLN injection One injection at onset of migraine.  May repeat in 2 hrs, if needed.  Max dose: 2 inj/day. This is a 30 day prescription. 12 vial 0  . tiZANidine (ZANAFLEX) 4 MG tablet TAKE ONE TABLET BY MOUTH THREE TIMES A DAY AS NEEDED FOR MIGRAINES FOR 30 DAYS 30 tablet 0  . traMADol (ULTRAM) 50 MG tablet Take 1 tablet (50 mg total) by mouth every 6 (six) hours as needed. 30 tablet 0  . triamcinolone cream (KENALOG) 0.1 % Apply 1 application topically 2 (two) times daily. 30 g 2  . Vitamin D, Ergocalciferol, (DRISDOL) 1.25  MG (50000 UT) CAPS capsule Take 1 capsule (50,000 Units total) by mouth every 7 (seven) days. 12 capsule 0   No current facility-administered medications on file prior to visit.     Observations/Objective: Alert, NAD, appropriate mood and affect, resps normal, cn 2-12 intact, moves all 4s, no visible rash or swelling Lab Results  Component Value Date   WBC 8.8 10/21/2018   HGB 14.7 10/21/2018   HCT 44.4 10/21/2018   PLT 277.0 10/21/2018   GLUCOSE 98 10/21/2018   CHOL 191 10/21/2018   TRIG 109.0 10/21/2018   HDL 60.40 10/21/2018   LDLDIRECT 164.5 09/19/2012   LDLCALC 109 (H) 10/21/2018   ALT 20 10/21/2018   AST 17 10/21/2018   NA 139 10/21/2018   K 3.8 10/21/2018   CL 102 10/21/2018   CREATININE 0.86 10/21/2018   BUN 9 10/21/2018   CO2 29 10/21/2018   TSH 1.85 10/21/2018   Assessment and Plan: See notes  Follow Up Instructions: See notes   I discussed the assessment and treatment plan with the patient. The patient was provided an opportunity to ask questions and all were answered. The patient agreed with the plan and demonstrated an understanding of the instructions.   The patient was advised to call back or seek an in-person evaluation if the symptoms worsen or if the condition fails to improve as anticipated.  Cathlean Cower, MD

## 2019-04-21 NOTE — Assessment & Plan Note (Addendum)
Etiology unclear, ok for chest ct wo CM for further evaluation r/o nodule or mass

## 2019-04-21 NOTE — Assessment & Plan Note (Signed)
BP Readings from Last 3 Encounters:  04/21/19 126/77  04/17/19 130/80  03/20/19 120/82  stable overall by history and exam, recent data reviewed with pt, and pt to continue medical treatment as before,  to f/u any worsening symptoms or concerns

## 2019-04-21 NOTE — Patient Instructions (Signed)
Please continue all other medications as before, and refills have been done if requested.  Please have the pharmacy call with any other refills you may need.  Please continue your efforts at being more active, low cholesterol diet, and weight control.  Please keep your appointments with your specialists as you may have planned  You will be contacted regarding the referral for: CT chest

## 2019-04-21 NOTE — Discharge Instructions (Addendum)
It was our pleasure to provide your ER care today - we hope that you feel better.  Your lab tests look good/normal. Your chest xray was read as showing no acute process. Incidental note was made of: Subtle indistinct hazy opacity overlying the right mid lung zone, only well appreciated on the frontal view. Discuss with primary care doctor, and have them repeat your xray in 4-6 weeks.   You may try acetaminophen as need for pain. You may try robaxin as need for muscle pain/spasm - no driving when taking.   For chest discomfort, follow up with cardiologist in next couple weeks - call office to arrange appointment.   Return to ER if worse, new symptoms, fevers, increased trouble breathing, recurrent/persistent chest pain, other concern.

## 2019-05-07 ENCOUNTER — Other Ambulatory Visit: Payer: Self-pay | Admitting: Family Medicine

## 2019-05-08 ENCOUNTER — Other Ambulatory Visit: Payer: Self-pay | Admitting: Internal Medicine

## 2019-05-12 ENCOUNTER — Telehealth: Payer: Self-pay

## 2019-05-12 NOTE — Telephone Encounter (Signed)
I have not, I was out of office most of last week.

## 2019-05-12 NOTE — Telephone Encounter (Signed)
Noted  

## 2019-05-12 NOTE — Telephone Encounter (Signed)
Copied from [place] [phone]. Topic: Medical Record Request - Provider/Facility Request >> May 12, 2019 11:55 AM Erick Blinks wrote: Patient Name/DOB/MRN #: Lauren Wolfe  01/30/1976  [id number] Requestor Name/Agency: Levonne Spiller, [employer] claim services  Call Back #: [phone] extension [phone] Information Requested: Stann Mainland recently faxed over a form on Friday. Inquiring to see if office has received it. Claim number: [id number]   Route to [facility] for [facility]. For all other clinics, route to the clinic's PEC Pool.

## 2019-05-16 ENCOUNTER — Other Ambulatory Visit: Payer: Self-pay

## 2019-05-16 ENCOUNTER — Ambulatory Visit (INDEPENDENT_AMBULATORY_CARE_PROVIDER_SITE_OTHER)
Admission: RE | Admit: 2019-05-16 | Discharge: 2019-05-16 | Disposition: A | Discharge status: Home / Self Care | Payer: 59 | Source: Ambulatory Visit | Attending: Internal Medicine | Admitting: Internal Medicine

## 2019-05-16 DIAGNOSIS — R079 Chest pain, unspecified: Secondary | ICD-10-CM

## 2019-05-17 ENCOUNTER — Other Ambulatory Visit: Payer: Self-pay | Admitting: Internal Medicine

## 2019-05-17 DIAGNOSIS — R9389 Abnormal findings on diagnostic imaging of other specified body structures: Secondary | ICD-10-CM

## 2019-05-19 ENCOUNTER — Telehealth: Payer: Self-pay

## 2019-05-19 NOTE — Telephone Encounter (Signed)
Pt has viewed results via MyChart  

## 2019-05-19 NOTE — Telephone Encounter (Signed)
-----   Message from Biagio Borg, MD sent at 05/17/2019  3:15 PM EDT ----- Left message on MyChart, pt to cont same tx except  The test results show that your current treatment is OK, as there is no evidence for a definite problem, but there was evidence for enlarged lymph nodes that are non specific.  Things that can cause this include infection, inflammation such as sarcoidosis, or even cancer.  This test is NOT definite for any of these.  We could wait for 6 mo to repeat the CT scan, but I think instead of waiting for this, we can refer you now to Pulmonary who can decide if this is bestl or some other kind of evaluation.  Remember, this could be very well non specific and not amount to anything, but we should have you see the pulmonary just in case.      Lauren Wolfe to please inform pt, I will do referral

## 2019-05-20 ENCOUNTER — Encounter: Payer: Self-pay | Admitting: Internal Medicine

## 2019-05-28 ENCOUNTER — Encounter: Payer: Self-pay | Admitting: Internal Medicine

## 2019-05-29 ENCOUNTER — Telehealth: Payer: Self-pay

## 2019-05-29 NOTE — Telephone Encounter (Signed)
LVM letting her know that her appt has been cancelled.

## 2019-06-02 ENCOUNTER — Encounter: Payer: Self-pay | Admitting: Emergency Medicine

## 2019-06-02 ENCOUNTER — Ambulatory Visit: Payer: 59 | Admitting: Neurology

## 2019-06-02 ENCOUNTER — Other Ambulatory Visit: Payer: Self-pay

## 2019-06-02 ENCOUNTER — Ambulatory Visit (INDEPENDENT_AMBULATORY_CARE_PROVIDER_SITE_OTHER): Payer: 59 | Admitting: Emergency Medicine

## 2019-06-02 VITALS — BP 128/92 | HR 83 | Ht 66.0 in | Wt 219.0 lb

## 2019-06-02 DIAGNOSIS — R590 Localized enlarged lymph nodes: Secondary | ICD-10-CM

## 2019-06-02 DIAGNOSIS — R9389 Abnormal findings on diagnostic imaging of other specified body structures: Secondary | ICD-10-CM | POA: Diagnosis not present

## 2019-06-02 NOTE — Patient Instructions (Signed)
We will repeat your CT scan of the chest and February 2021 to follow for interval stability. Follow with Dr. Lamonte Sakai in February after the scan to review together. Please call us to arrange to be seen sooner if you develop any breathing difficulty, any new rash, any swollen lymph nodes.

## 2019-06-02 NOTE — Assessment & Plan Note (Signed)
Linear opacity in the anterior right middle lobe, likely subsegmental atelectasis versus scar.  Question whether she may have had previous bronchitis, pneumonia.  This may actually resolve on next film if it reflects simply atelectasis.  Also with some right paratracheal borderline enlarged lymph nodes of unclear significance.  I explained these findings to her, explained that it is unclear whether they actually have any clinical significance.  I also explained to her certain scenarios where they could be important, for example autoimmune disease, sarcoidosis, etc.  I think it is very low likelihood that the lymphadenopathy reflects lymphoma, but it is possible.  Now that the abnormalities have been identified, I believe they need to be followed for interval change to ensure that they do not someday merit biopsy, etc. Reviewed with her the signs / sx of sarcoidosis so that she can inform me if any develop.   We will repeat your CT scan of the chest and February 2021 to follow for interval stability. Follow with Dr. Lamonte Sakai in February after the scan to review together. Please call us to arrange to be seen sooner if you develop any breathing difficulty, any new rash, any swollen lymph nodes.

## 2019-06-02 NOTE — Progress Notes (Signed)
Subjective:    Patient ID: Lauren Wolfe, female    DOB: October 04, 1975, 43 y.o.   MRN: [id number]  HPI 43 year old woman, never smoker, with a history of migraines, right upper extremity DVT.  She presents today as a new consultation for evaluation of an abnormal CT scan of the chest.  She was originally evaluated in the ED 7/19 for chest discomfort. In the end she had a reassuring evaluation.  A chest x-ray was done that day that showed slightly increased hazy interstitial markings in the right midlung.  Etiology was unclear, prompted a CT scan of the chest that was done on 05/16/2019 and I reviewed.  This shows some anterior right middle lobe linear opacity most consistent with subsegmental atelectasis, consider scar.  She also had some mild right paratracheal lymphadenopathy.  Significance unclear.   She has never had any chronic pulmonary issues, no asthma, no thoracic surgeries.  She believes that she may have been dx with PNA before, but she is unsure. Denies any breathing trouble. No cough.  The CT abnormalities were found completely spuriously.   Review of Systems  Constitutional: Negative for fever and unexpected weight change.  HENT: Negative for congestion, dental problem, ear pain, nosebleeds, postnasal drip, rhinorrhea, sinus pressure, sneezing, sore throat and trouble swallowing.   Eyes: Negative for redness and itching.  Respiratory: Negative for cough, chest tightness, shortness of breath and wheezing.   Cardiovascular: Negative for palpitations and leg swelling.  Gastrointestinal: Negative for nausea and vomiting.  Genitourinary: Negative for dysuria.  Musculoskeletal: Negative for joint swelling.  Skin: Negative for rash.  Neurological: Negative for headaches.  Hematological: Does not bruise/bleed easily.  Psychiatric/Behavioral: Negative for dysphoric mood. The patient is not nervous/anxious.    Past Medical History:  Diagnosis Date  . Anxiety   . Arm vein blood clot,  right   . Migraine   . Wears glasses    and contacts     Family History  Problem Relation Age of Onset  . Hypertension Mother   . Aneurysm Mother   . Stomach cancer Father   . Other Other        grandparent with DJD  . Hypertension Other        grandparent  . Diabetes Other        grandparent  . Gout Other        grandfather  . Breast cancer Other        grandmother  . Migraines Paternal Aunt   . Breast cancer Cousin   . Breast cancer Maternal Grandmother      Social History   Socioeconomic History  . Marital status: Single    Spouse name: Not on file  . Number of children: 1  . Years of education: some college  . Highest education level: Not on file  Occupational History  . Occupation: cust service rep w/ [employer]  . Financial resource strain: Not on file  . Food insecurity    Worry: Not on file    Inability: Not on file  . Transportation needs    Medical: Not on file    Non-medical: Not on file  Tobacco Use  . Smoking status: Never Smoker  . Smokeless tobacco: Never Used  Substance and Sexual Activity  . Alcohol use: No  . Drug use: No  . Sexual activity: Yes    Birth control/protection: Pill  Lifestyle  . Physical activity    Days per week: Not on file  Minutes per session: Not on file  . Stress: Not on file  Relationships  . Social Herbalist on phone: Not on file    Gets together: Not on file    Attends religious service: Not on file    Active member of club or organization: Not on file    Attends meetings of clubs or organizations: Not on file    Relationship status: Not on file  . Intimate partner violence    Fear of current or ex partner: Not on file    Emotionally abused: Not on file    Physically abused: Not on file    Forced sexual activity: Not on file  Other Topics Concern  . Not on file  Social History Narrative   Lives at home with her daughter.   Right-handed.   No caffeine use.   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  No military [place] native.   Allergies  Allergen Reactions  . Azithromycin Shortness Of Breath    REACTION: rash, prurutis  . Codeine Shortness Of Breath    Causes shortness of breath, rash, itching  . Hydrocodone Shortness Of Breath    Rash, itching  . Ketorolac Tromethamine Shortness Of Breath    Itching, rash  . Prednisone     SOB  . Tramadol     itching     Outpatient Medications Prior to Visit  Medication Sig Dispense Refill  . Diclofenac Sodium (PENNSAID) 2 % SOLN Place 2 g onto the skin 2 (two) times daily. 112 g 3  . Galcanezumab-gnlm (EMGALITY) 120 MG/ML SOAJ Inject 120 mg into the skin every 30 (thirty) days. 1 pen 5  . ibuprofen (ADVIL) 600 MG tablet TAKE 1 TABLET (600 MG TOTAL) BY MOUTH EVERY 8 (EIGHT) HOURS. 90 tablet 1  . indomethacin (INDOCIN) 25 MG capsule TAKE ONE CAPSULE BY MOUTH 3 TIMES A DAY AS NEEDED MUST LAST 30 DAYS 30 capsule 0  . levonorgestrel (MIRENA) 20 MCG/24HR IUD 1 each by Intrauterine route once.    . methocarbamol (ROBAXIN) 750 MG tablet Take 1 tablet (750 mg total) by mouth 3 (three) times daily as needed (muscle spasm/pain). 15 tablet 0  . ondansetron (ZOFRAN-ODT) 8 MG disintegrating tablet TAKE 1 TABLET (8 MG TOTAL) BY MOUTH EVERY 8 (EIGHT) HOURS AS NEEDED FOR NAUSEA OR VOMITING. 20 tablet 2  . SUMAtriptan (IMITREX) 6 MG/0.5ML SOLN injection One injection at onset of migraine.  May repeat in 2 hrs, if needed.  Max dose: 2 inj/day. This is a 30 day prescription. 12 vial 0  . tiZANidine (ZANAFLEX) 4 MG tablet TAKE ONE TABLET BY MOUTH THREE TIMES A DAY AS NEEDED FOR MIGRAINES FOR 30 DAYS 30 tablet 0  . traMADol (ULTRAM) 50 MG tablet Take 1 tablet (50 mg total) by mouth every 6 (six) hours as needed. 30 tablet 0  . triamcinolone cream (KENALOG) 0.1 % APPLY TO AFFECTED AREA TWICE A DAY 30 g 2  . Vitamin D, Ergocalciferol, (DRISDOL) 1.25 MG (50000 UT) CAPS capsule Take 1 capsule (50,000 Units total) by mouth every 7 (seven) days. 12  capsule 0   No facility-administered medications prior to visit.         Objective:   Physical Exam Vitals:   06/02/19 1554  BP: (!) 128/92  Pulse: 83  SpO2: 100%  Weight: 219 lb (99.3 kg)  Height: 5' 6"  (1.676 m)   Gen: Pleasant, overwt woman, in no distress,  normal affect  ENT: No  lesions,  mouth clear,  oropharynx clear, no postnasal drip  Neck: No JVD, no stridor  Lungs: No use of accessory muscles, no crackles or wheezing on normal respiration, no wheeze on forced expiration  Cardiovascular: RRR, heart sounds normal, no murmur or gallops, no peripheral edema  Musculoskeletal: No deformities, no cyanosis or clubbing  Neuro: alert, awake, non focal  Skin: Warm, no lesions or rash      Assessment & Plan:  Abnormal CT of the chest Linear opacity in the anterior right middle lobe, likely subsegmental atelectasis versus scar.  Question whether she may have had previous bronchitis, pneumonia.  This may actually resolve on next film if it reflects simply atelectasis.  Also with some right paratracheal borderline enlarged lymph nodes of unclear significance.  I explained these findings to her, explained that it is unclear whether they actually have any clinical significance.  I also explained to her certain scenarios where they could be important, for example autoimmune disease, sarcoidosis, etc.  I think it is very low likelihood that the lymphadenopathy reflects lymphoma, but it is possible.  Now that the abnormalities have been identified, I believe they need to be followed for interval change to ensure that they do not someday merit biopsy, etc. Reviewed with her the signs / sx of sarcoidosis so that she can inform me if any develop.   We will repeat your CT scan of the chest and February 2021 to follow for interval stability. Follow with Dr. Lamonte Sakai in February after the scan to review together. Please call us to arrange to be seen sooner if you develop any breathing  difficulty, any new rash, any swollen lymph nodes.  Baltazar Apo, MD, PhD 06/02/2019, 4:40 PM [facility] [phone] or if no answer [phone]

## 2019-06-03 ENCOUNTER — Other Ambulatory Visit: Payer: Self-pay | Admitting: Family Medicine

## 2019-06-11 ENCOUNTER — Other Ambulatory Visit: Payer: Self-pay | Admitting: Neurology

## 2019-06-30 ENCOUNTER — Encounter: Payer: Self-pay | Admitting: Neurology

## 2019-06-30 ENCOUNTER — Other Ambulatory Visit: Payer: Self-pay

## 2019-06-30 ENCOUNTER — Telehealth: Payer: Self-pay | Admitting: *Deleted

## 2019-06-30 ENCOUNTER — Ambulatory Visit (INDEPENDENT_AMBULATORY_CARE_PROVIDER_SITE_OTHER): Payer: 59 | Admitting: Neurology

## 2019-06-30 VITALS — BP 131/86 | HR 71 | Temp 97.8°F | Ht 66.0 in | Wt 216.5 lb

## 2019-06-30 DIAGNOSIS — G43709 Chronic migraine without aura, not intractable, without status migrainosus: Secondary | ICD-10-CM

## 2019-06-30 DIAGNOSIS — IMO0002 Reserved for concepts with insufficient information to code with codable children: Secondary | ICD-10-CM

## 2019-06-30 MED ORDER — SUMATRIPTAN SUCCINATE 6 MG/0.5ML ~~LOC~~ SOLN: SUBCUTANEOUS | 11 refills | Status: DC

## 2019-06-30 MED ORDER — TIZANIDINE HCL 4 MG PO TABS: ORAL_TABLET | ORAL | 6 refills | Status: DC

## 2019-06-30 MED ORDER — ONDANSETRON 8 MG PO TBDP: 8.0000 mg | ORAL_TABLET | Freq: Three times a day (TID) | ORAL | 6 refills | Status: DC | PRN

## 2019-06-30 MED ORDER — AJOVY 225 MG/1.5ML ~~LOC~~ SOSY: 225.0000 mg | PREFILLED_SYRINGE | SUBCUTANEOUS | 11 refills | Status: DC

## 2019-06-30 MED ORDER — METHOCARBAMOL 750 MG PO TABS: 750.0000 mg | ORAL_TABLET | Freq: Three times a day (TID) | ORAL | 6 refills | Status: DC | PRN
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
## 2019-06-30 NOTE — Telephone Encounter (Signed)
PA approved for Ajovy through 06/29/2020. [email or web address] key: [id number].  Pt has coverage with [employer] [phone]).

## 2019-07-03 ENCOUNTER — Telehealth: Payer: Self-pay | Admitting: Family Medicine

## 2019-07-03 NOTE — Telephone Encounter (Signed)
Received a message that patient needed to schedule an appointment with Dr Tamala Julian.  Called patient and left a message for her to call back to schedule first available.

## 2019-07-04 ENCOUNTER — Other Ambulatory Visit: Payer: Self-pay | Admitting: Family Medicine

## 2019-07-29 ENCOUNTER — Encounter: Payer: Self-pay | Admitting: Family Medicine

## 2019-07-29 ENCOUNTER — Ambulatory Visit (INDEPENDENT_AMBULATORY_CARE_PROVIDER_SITE_OTHER): Payer: 59 | Admitting: Family Medicine

## 2019-07-29 ENCOUNTER — Other Ambulatory Visit: Payer: Self-pay

## 2019-07-29 DIAGNOSIS — M25562 Pain in left knee: Secondary | ICD-10-CM

## 2019-07-29 NOTE — Progress Notes (Addendum)
[facility] [place], [place] [address] Phone: [phone] Subjective:   [name], am serving as a scribe for Dr. Hulan Saas.    CC: Left leg pain  [id number]   04/17/2019 Doing well at this time.  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  Did great with physical therapy.  Follow-up again as needed  Update 07/29/2019 Lauren Wolfe is a 43 y.o. female coming in with complaint of left knee pain. Patient states that she is trying to get back to walking and doing jumping jacks. Having increasing her pain over patella. Tried activity in knee brace which did not help. Has been using IBU but is having constant pain when not on NSAIDs.  Patient did have x-rays of the knee done.  X-rays that showed that patient had moderate patellofemoral arthritis otherwise unremarkable    Past Medical History:  Diagnosis Date  . Anxiety   . Arm vein blood clot, right   . Migraine   . Wears glasses    and contacts   Past Surgical History:  Procedure Laterality Date  . BREAST BIOPSY  10/31/2012   Procedure: BREAST BIOPSY WITH NEEDLE LOCALIZATION;  Surgeon: Rolm Bookbinder, MD;  Location: [facility];  Service: General;  Laterality: Right;  right breast wire guided excisional biopsy  . BREAST EXCISIONAL BIOPSY Right 2014  . ganglion cysts  2011  . WRIST SURGERY Left    Social History   Socioeconomic History  . Marital status: Single    Spouse name: Not on file  . Number of children: 1  . Years of education: some college  . Highest education level: Not on file  Occupational History  . Occupation: cust service rep w/ [employer]  . Financial resource strain: Not on file  . Food insecurity    Worry: Not on file    Inability: Not on file  . Transportation needs    Medical: Not on file    Non-medical: Not on file  Tobacco Use  . Smoking status: Never Smoker  . Smokeless tobacco: Never  Used  Substance and Sexual Activity  . Alcohol use: No  . Drug use: No  . Sexual activity: Yes    Birth control/protection: Pill  Lifestyle  . Physical activity    Days per week: Not on file    Minutes per session: Not on file  . Stress: Not on file  Relationships  . Social Herbalist on phone: Not on file    Gets together: Not on file    Attends religious service: Not on file    Active member of club or organization: Not on file    Attends meetings of clubs or organizations: Not on file    Relationship status: Not on file  Other Topics Concern  . Not on file  Social History Narrative   Lives at home with her daughter.   Right-handed.   No caffeine use.   Allergies  Allergen Reactions  . Azithromycin Shortness Of Breath    REACTION: rash, prurutis  . Codeine Shortness Of Breath    Causes shortness of breath, rash, itching  . Hydrocodone Shortness Of Breath    Rash, itching  . Ketorolac Tromethamine Shortness Of Breath    Itching, rash  . Prednisone     SOB  . Tramadol     itching   Family History  Problem Relation Age of Onset  . Hypertension  Mother   . Aneurysm Mother   . Stomach cancer Father   . Other Other        grandparent with DJD  . Hypertension Other        grandparent  . Diabetes Other        grandparent  . Gout Other        grandfather  . Breast cancer Other        grandmother  . Migraines Paternal Aunt   . Breast cancer Cousin   . Breast cancer Maternal Grandmother     Current Outpatient Medications (Endocrine & Metabolic):  .  levonorgestrel (MIRENA) 20 MCG/24HR IUD, 1 each by Intrauterine route once.    Current Outpatient Medications (Analgesics):  Marland Kitchen  Fremanezumab-vfrm (AJOVY) 225 MG/1.5ML SOSY, Inject 225 mg into the skin every 30 (thirty) days. Marland Kitchen  ibuprofen (ADVIL) 600 MG tablet, TAKE 1 TABLET (600 MG TOTAL) BY MOUTH EVERY 8 (EIGHT) HOURS. .  SUMAtriptan (IMITREX) 6 MG/0.5ML SOLN injection, One injection at onset of  migraine.  May repeat in 2 hrs, if needed.  Max dose: 2 inj/day. This is a 30 day prescription. .  traMADol (ULTRAM) 50 MG tablet, Take 1 tablet (50 mg total) by mouth every 6 (six) hours as needed.   Current Outpatient Medications (Other):  Marland Kitchen  Diclofenac Sodium (PENNSAID) 2 % SOLN, Place 2 g onto the skin 2 (two) times daily. .  methocarbamol (ROBAXIN) 750 MG tablet, Take 1 tablet (750 mg total) by mouth 3 (three) times daily as needed (muscle spasm/pain). .  ondansetron (ZOFRAN-ODT) 8 MG disintegrating tablet, Take 1 tablet (8 mg total) by mouth every 8 (eight) hours as needed for nausea or vomiting. Marland Kitchen  tiZANidine (ZANAFLEX) 4 MG tablet, TAKE ONE TABLET BY MOUTH THREE TIMES A DAY AS NEEDED FOR MIGRAINES FOR 30 DAYS .  triamcinolone cream (KENALOG) 0.1 %, APPLY TO AFFECTED AREA TWICE A DAY .  Vitamin D, Ergocalciferol, (DRISDOL) 1.25 MG (50000 UT) CAPS capsule, TAKE 1 CAPSULE (50,000 UNITS TOTAL) BY MOUTH EVERY 7 (SEVEN) DAYS.    Past medical history, social, surgical and family history all reviewed in electronic medical record.  No pertanent information unless stated regarding to the chief complaint.   Review of Systems:  No headache, visual changes, nausea, vomiting, diarrhea, constipation, dizziness, abdominal pain, skin rash, fevers, chills, night sweats, weight loss, swollen lymph nodes, body aches, joint swelling, chest pain, shortness of breath, mood changes.  Positive muscle aches  Objective  Blood pressure 110/86, pulse 89, height 5' 6"  (1.676 m), weight 217 lb (98.4 kg), SpO2 98 %.    General: No apparent distress alert and oriented x3 mood and affect normal, dressed appropriately.  HEENT: Pupils equal, extraocular movements intact  Respiratory: Patient's speak in full sentences and does not appear short of breath  Cardiovascular: No lower extremity edema, non tender, no erythema  Skin: Warm dry intact with no signs of infection or rash on extremities or on axial skeleton.   Abdomen: Soft nontender  Neuro: Cranial nerves II through XII are intact, neurovascularly intact in all extremities with 2+ DTRs and 2+ pulses.  Lymph: No lymphadenopathy of posterior or anterior cervical chain or axillae bilaterally.  Gait normal with good balance and coordination.  MSK:  Non tender with full range of motion and good stability and symmetric strength and tone of shoulders, elbows, wrist, hip and ankles bilaterally.  Left knee exam shows the patient does have a positive patella grind.  Lateral tracking of the  kneecap noted, lacks last 2 degrees of extension.  Negative McMurray's, MCL and LCL intact contralateral knee mild grinding but no pain  After informed written and verbal consent, patient was seated on exam table. Left knee was prepped with alcohol swab and utilizing anterolateral approach, patient's left knee space was injected with 4:1  marcaine 0.5%: Kenalog 56m/dL. Patient tolerated the procedure well without immediate complications.    Impression and Recommendations:     This case required medical decision making of moderate complexity. The above documentation has been reviewed and is accurate and complete ZLyndal Pulley DO       Note: This dictation was prepared with Dragon dictation along with smaller phrase technology. Any transcriptional errors that result from this process are unintentional.

## 2019-07-29 NOTE — Patient Instructions (Signed)
Start exercise on Monday Brace when working out See me in 5 weeks

## 2019-07-30 ENCOUNTER — Encounter: Payer: Self-pay | Admitting: Family Medicine

## 2019-07-30 NOTE — Assessment & Plan Note (Signed)
Patellofemoral arthritis, likely contributing to most of the discomfort and pain.  Could be a candidate for viscosupplementation.  Discussed icing regimen and home exercise, which activities to do which wants to avoid.  Patient can increase activity slowly over the course the next several weeks.  Patient will follow up with me again in 4 to 8 weeks

## 2019-08-14 ENCOUNTER — Other Ambulatory Visit: Payer: Self-pay | Admitting: Family Medicine

## 2019-09-03 ENCOUNTER — Other Ambulatory Visit: Payer: Self-pay

## 2019-09-03 ENCOUNTER — Encounter: Payer: Self-pay | Admitting: Family Medicine

## 2019-09-03 ENCOUNTER — Ambulatory Visit (INDEPENDENT_AMBULATORY_CARE_PROVIDER_SITE_OTHER): Payer: 59 | Admitting: Family Medicine

## 2019-09-03 DIAGNOSIS — M25562 Pain in left knee: Secondary | ICD-10-CM

## 2019-09-03 NOTE — Patient Instructions (Addendum)
  [address], [address]st [address] [place], [place] [address] Phone [phone]  Sorry we didn't have improved We will call you once approved

## 2019-09-03 NOTE — Progress Notes (Signed)
[facility] [place], [place] [address] Phone: [phone] Subjective:   [name], am serving as a scribe for Dr. Hulan Saas. This visit occurred during the SARS-CoV-2 public health emergency.  Safety protocols were in place, including screening questions prior to the visit, additional usage of staff PPE, and extensive cleaning of exam room while observing appropriate contact time as indicated for disinfecting solutions.  I'm seeing this patient by the request  of:    CC: Left knee pain follow-up  [id number]   07/30/2019 Patellofemoral arthritis, likely contributing to most of the discomfort and pain.  Could be a candidate for viscosupplementation.  Discussed icing regimen and home exercise, which activities to do which wants to avoid.  Patient can increase activity slowly over the course the next several weeks.  Patient will follow up with me again in 4 to 8 weeks  Update 09/03/2019 Lauren Wolfe is a 43 y.o. female coming in with complaint of left knee pain. Patient states that she continues to have pain in anterior lateral aspect of her knee. Injection last visit did help alleviate her pain for 2 weeks. Dull achy pain.  Patient states continues to have pain on a fairly regular basis.     Past Medical History:  Diagnosis Date  . Anxiety   . Arm vein blood clot, right   . Migraine   . Wears glasses    and contacts   Past Surgical History:  Procedure Laterality Date  . BREAST BIOPSY  10/31/2012   Procedure: BREAST BIOPSY WITH NEEDLE LOCALIZATION;  Surgeon: Rolm Bookbinder, MD;  Location: [facility];  Service: General;  Laterality: Right;  right breast wire guided excisional biopsy  . BREAST EXCISIONAL BIOPSY Right 2014  . ganglion cysts  2011  . WRIST SURGERY Left    Social History   Socioeconomic History  . Marital status: Single    Spouse name: Not on file  . Number of children: 1  . Years of  education: some college  . Highest education level: Not on file  Occupational History  . 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 Financial resource strain: Not on file  . Food insecurity    Worry: Not on file    Inability: Not on file  . Transportation needs    Medical: Not on file    Non-medical: Not on file  Tobacco Use  . Smoking status: Never Smoker  . Smokeless tobacco: Never Used  Substance and Sexual Activity  . Alcohol use: No  . Drug use: No  . Sexual activity: Yes    Birth control/protection: Pill  Lifestyle  . Physical activity    Days per week: Not on file    Minutes per session: Not on file  . Stress: Not on file  Relationships  . Social Herbalist on phone: Not on file    Gets together: Not on file    Attends religious service: Not on file    Active member of club or organization: Not on file    Attends meetings of clubs or organizations: Not on file    Relationship status: Not on file  Other Topics Concern  . Not on file  Social History Narrative   Lives at home with her daughter.   Right-handed.   No caffeine use.   Allergies  Allergen Reactions  . Azithromycin Shortness Of Breath    REACTION: rash, prurutis  .  Codeine Shortness Of Breath    Causes shortness of breath, rash, itching  . Hydrocodone Shortness Of Breath    Rash, itching  . Ketorolac Tromethamine Shortness Of Breath    Itching, rash  . Prednisone     SOB  . Tramadol     itching   Family History  Problem Relation Age of Onset  . Hypertension Mother   . Aneurysm Mother   . Stomach cancer Father   . Other Other        grandparent with DJD  . Hypertension Other        grandparent  . Diabetes Other        grandparent  . Gout Other        grandfather  . Breast cancer Other        grandmother  . Migraines Paternal Aunt   . Breast cancer Cousin   . Breast cancer Maternal Grandmother     Current Outpatient Medications (Endocrine & Metabolic):  .   levonorgestrel (MIRENA) 20 MCG/24HR IUD, 1 each by Intrauterine route once.    Current Outpatient Medications (Analgesics):  Marland Kitchen  Fremanezumab-vfrm (AJOVY) 225 MG/1.5ML SOSY, Inject 225 mg into the skin every 30 (thirty) days. Marland Kitchen  ibuprofen (ADVIL) 600 MG tablet, TAKE 1 TABLET (600 MG TOTAL) BY MOUTH EVERY 8 (EIGHT) HOURS. .  SUMAtriptan (IMITREX) 6 MG/0.5ML SOLN injection, One injection at onset of migraine.  May repeat in 2 hrs, if needed.  Max dose: 2 inj/day. This is a 30 day prescription. .  traMADol (ULTRAM) 50 MG tablet, Take 1 tablet (50 mg total) by mouth every 6 (six) hours as needed.   Current Outpatient Medications (Other):  Marland Kitchen  Diclofenac Sodium (PENNSAID) 2 % SOLN, Place 2 g onto the skin 2 (two) times daily. .  methocarbamol (ROBAXIN) 750 MG tablet, Take 1 tablet (750 mg total) by mouth 3 (three) times daily as needed (muscle spasm/pain). .  ondansetron (ZOFRAN-ODT) 8 MG disintegrating tablet, Take 1 tablet (8 mg total) by mouth every 8 (eight) hours as needed for nausea or vomiting. Marland Kitchen  tiZANidine (ZANAFLEX) 4 MG tablet, TAKE ONE TABLET BY MOUTH THREE TIMES A DAY AS NEEDED FOR MIGRAINES FOR 30 DAYS .  triamcinolone cream (KENALOG) 0.1 %, APPLY TO AFFECTED AREA TWICE A DAY .  Vitamin D, Ergocalciferol, (DRISDOL) 1.25 MG (50000 UT) CAPS capsule, TAKE 1 CAPSULE (50,000 UNITS TOTAL) BY MOUTH EVERY 7 (SEVEN) DAYS.    Past medical history, social, surgical and family history all reviewed in electronic medical record.  No pertanent information unless stated regarding to the chief complaint.   Review of Systems:  No headache, visual changes, nausea, vomiting, diarrhea, constipation, dizziness, abdominal pain, skin rash, fevers, chills, night sweats, weight loss, swollen lymph nodes, body aches, joint swelling, , chest pain, shortness of breath, mood changes.  Positive muscle aches  Objective  Blood pressure 112/64, pulse 64, height 5' 6"  (1.676 m), weight 221 lb (100.2 kg), SpO2 97 %.  Lauren Wolfe has all think she can go out and under 30  General: No apparent distress alert and oriented x3 mood and affect normal, dressed appropriately.  HEENT: Pupils equal, extraocular movements intact  Respiratory: Patient's speak in full sentences and does not appear short of breath  Cardiovascular: No lower extremity edema, non tender, no erythema  Skin: Warm dry intact with no signs of infection or rash on extremities or on axial skeleton.  Abdomen: Soft nontender  Neuro: Cranial nerves II through XII are intact, neurovascularly  intact in all extremities with 2+ DTRs and 2+ pulses.  Lymph: No lymphadenopathy of posterior or anterior cervical chain or axillae bilaterally.  Gait normal with good balance and coordination.  MSK: tender with full range of motion and good stability and symmetric strength and tone of shoulders, elbows, wrist, hip, and ankles bilaterally.  Left knee exam still has a positive patella grind, patient does have lateral tracking noted.  Tender to palpation over the superior lateral patella joint.  Mild over the lateral joint line.  MCL and LCL intact.    Impression and Recommendations:     . The above documentation has been reviewed and is accurate and complete Lyndal Pulley, DO       Note: This dictation was prepared with Dragon dictation along with smaller phrase technology. Any transcriptional errors that result from this process are unintentional.

## 2019-09-03 NOTE — Assessment & Plan Note (Signed)
Patient continues to have more of the patellofemoral arthritis.  I do think more viscosupplementation.  Discussed which activities to do which will still avoid.  Patient has failed all other conservative therapy and we will consider viscosupplementation and see if we can get approval.

## 2019-09-17 ENCOUNTER — Other Ambulatory Visit: Payer: Self-pay

## 2019-09-17 ENCOUNTER — Ambulatory Visit (INDEPENDENT_AMBULATORY_CARE_PROVIDER_SITE_OTHER): Payer: 59 | Admitting: Family Medicine

## 2019-09-17 ENCOUNTER — Encounter: Payer: Self-pay | Admitting: Family Medicine

## 2019-09-17 DIAGNOSIS — M172 Bilateral post-traumatic osteoarthritis of knee: Secondary | ICD-10-CM

## 2019-09-17 DIAGNOSIS — M25562 Pain in left knee: Secondary | ICD-10-CM

## 2019-09-17 NOTE — Assessment & Plan Note (Addendum)
Patient is x-ray showing the minimal to moderate arthritic changes.  Patient hopefully will respond very well to viscosupplementation.  Due to patient's young age I do not think that surgical intervention is necessary.  Pain has been going on for quite some time and advanced imaging could be warranted but I do not think a high level of unlikely.  Plan holding the patient in the next 2 to 3 months will be bacterial 100% patient will follow up with me again in 4 to 6 weeks.

## 2019-09-17 NOTE — Progress Notes (Signed)
[facility] [place], [place] [address] Phone: [phone] Subjective:   [name], am serving as a scribe for Dr. Hulan Saas. This visit occurred during the SARS-CoV-2 public health emergency.  Safety protocols were in place, including screening questions prior to the visit, additional usage of staff PPE, and extensive cleaning of exam room while observing appropriate contact time as indicated for disinfecting solutions.   I'm seeing this patient by the request  of:    CC: Left knee pain  [id number]   09/03/2019 Patient continues to have more of the patellofemoral arthritis.  I do think more viscosupplementation.  Discussed which activities to do which will still avoid.  Patient has failed all other conservative therapy and we will consider viscosupplementation and see if we can get approval.  Update 09/17/2019 Lauren Wolfe is a 43 y.o. female coming in with complaint of left knee pain. Patient states that she is having constant pain due to changes in weather. Going up stairs increases her pain. Is here for Durllane injection.  Patient did have a fall that seem to exacerbate the underlying arthritic changes.    Past Medical History:  Diagnosis Date  . Anxiety   . Arm vein blood clot, right   . Migraine   . Wears glasses    and contacts   Past Surgical History:  Procedure Laterality Date  . BREAST BIOPSY  10/31/2012   Procedure: BREAST BIOPSY WITH NEEDLE LOCALIZATION;  Surgeon: Rolm Bookbinder, MD;  Location: [facility];  Service: General;  Laterality: Right;  right breast wire guided excisional biopsy  . BREAST EXCISIONAL BIOPSY Right 2014  . ganglion cysts  2011  . WRIST SURGERY Left    Social History   Socioeconomic History  . Marital status: Single    Spouse name: Not on file  . Number of children: 1  . Years of education: some college  . Highest education level: Not on file  Occupational  History  . 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 Smoking status: Never Smoker  . Smokeless tobacco: Never Used  Substance and Sexual Activity  . Alcohol use: No  . Drug use: No  . Sexual activity: Yes    Birth control/protection: Pill  Other Topics Concern  . Not on file  Social History Narrative   Lives at home with her daughter.   Right-handed.   No caffeine use.   Social Determinants of Health   Financial Resource Strain:   . Difficulty of Paying Living Expenses: Not on file  Food Insecurity:   . Worried About Charity fundraiser in the Last Year: Not on file  . Ran Out of Food in the Last Year: Not on file  Transportation Needs:   . Lack of Transportation (Medical): Not on file  . Lack of Transportation (Non-Medical): Not on file  Physical Activity:   . Days of Exercise per Week: Not on file  . Minutes of Exercise per Session: Not on file  Stress:   . Feeling of Stress : Not on file  Social Connections:   . Frequency of Communication with Friends and Family: Not on file  . Frequency of Social Gatherings with Friends and Family: Not on file  . Attends Religious Services: Not on file  . Active Member of Clubs or Organizations: Not on file  . Attends Archivist Meetings: Not on file  . Marital Status: Not on file  Allergies  Allergen Reactions  . Azithromycin Shortness Of Breath    REACTION: rash, prurutis  . Codeine Shortness Of Breath    Causes shortness of breath, rash, itching  . Hydrocodone Shortness Of Breath    Rash, itching  . Ketorolac Tromethamine Shortness Of Breath    Itching, rash  . Prednisone     SOB  . Tramadol     itching   Family History  Problem Relation Age of Onset  . Hypertension Mother   . Aneurysm Mother   . Stomach cancer Father   . Other Other        grandparent with DJD  . Hypertension Other        grandparent  . Diabetes Other        grandparent  . Gout Other        grandfather  . Breast cancer  Other        grandmother  . Migraines Paternal Aunt   . Breast cancer Cousin   . Breast cancer Maternal Grandmother     Current Outpatient Medications (Endocrine & Metabolic):  .  levonorgestrel (MIRENA) 20 MCG/24HR IUD, 1 each by Intrauterine route once.    Current Outpatient Medications (Analgesics):  Marland Kitchen  Fremanezumab-vfrm (AJOVY) 225 MG/1.5ML SOSY, Inject 225 mg into the skin every 30 (thirty) days. Marland Kitchen  ibuprofen (ADVIL) 600 MG tablet, TAKE 1 TABLET (600 MG TOTAL) BY MOUTH EVERY 8 (EIGHT) HOURS. .  SUMAtriptan (IMITREX) 6 MG/0.5ML SOLN injection, One injection at onset of migraine.  May repeat in 2 hrs, if needed.  Max dose: 2 inj/day. This is a 30 day prescription. .  traMADol (ULTRAM) 50 MG tablet, Take 1 tablet (50 mg total) by mouth every 6 (six) hours as needed.   Current Outpatient Medications (Other):  Marland Kitchen  Diclofenac Sodium (PENNSAID) 2 % SOLN, Place 2 g onto the skin 2 (two) times daily. .  methocarbamol (ROBAXIN) 750 MG tablet, Take 1 tablet (750 mg total) by mouth 3 (three) times daily as needed (muscle spasm/pain). .  ondansetron (ZOFRAN-ODT) 8 MG disintegrating tablet, Take 1 tablet (8 mg total) by mouth every 8 (eight) hours as needed for nausea or vomiting. Marland Kitchen  tiZANidine (ZANAFLEX) 4 MG tablet, TAKE ONE TABLET BY MOUTH THREE TIMES A DAY AS NEEDED FOR MIGRAINES FOR 30 DAYS .  triamcinolone cream (KENALOG) 0.1 %, APPLY TO AFFECTED AREA TWICE A DAY .  Vitamin D, Ergocalciferol, (DRISDOL) 1.25 MG (50000 UT) CAPS capsule, TAKE 1 CAPSULE (50,000 UNITS TOTAL) BY MOUTH EVERY 7 (SEVEN) DAYS.    Past medical history, social, surgical and family history all reviewed in electronic medical record.  No pertanent information unless stated regarding to the chief complaint.   Review of Systems:  No headache, visual changes, nausea, vomiting, diarrhea, constipation, dizziness, abdominal pain, skin rash, fevers, chills, night sweats, weight loss, swollen lymph nodes, body aches, joint  swelling,  chest pain, shortness of breath, mood changes.  Positive muscle aches  Objective  Blood pressure 124/90, pulse 92, height 5' 6"  (1.676 m), weight 221 lb (100.2 kg), SpO2 98 %.    General: No apparent distress alert and oriented x3 mood and affect normal, dressed appropriately.  HEENT: Pupils equal, extraocular movements intact  Respiratory: Patient's speak in full sentences and does not appear short of breath  Cardiovascular: No lower extremity edema, non tender, no erythema  Skin: Warm dry intact with no signs of infection or rash on extremities or on axial skeleton.  Abdomen: Soft nontender  Neuro: Cranial nerves II through XII are intact, neurovascularly intact in all extremities with 2+ DTRs and 2+ pulses.  Lymph: No lymphadenopathy of posterior or anterior cervical chain or axillae bilaterally.  Gait normal with good balance and coordination.  MSK:  Non tender with full range of motion and good stability and symmetric strength and tone of shoulders, elbows, wrist, hip, and ankles bilaterally.  Left knee exam shows the patient does have mild positive patellar grind test.  Tenderness to palpation in the area.  Mild positive McMurray's.  Near full range of motion.  Minimal laxity noted with valgus and varus force  After informed written and verbal consent, patient was seated on exam table. Left knee was prepped with alcohol swab and utilizing anterolateral approach, patient's left knee space was injected with with 60 mg per 3 mL of Durolane (sodium hyaluronate) in a prefilled syringe was injected easily into the knee through a 22-gauge needle..Patient tolerated the procedure well without immediate complications.   Impression and Recommendations:     This case required medical decision making of moderate complexity. The above documentation has been reviewed and is accurate and complete Lyndal Pulley, DO       Note: This dictation was prepared with Dragon dictation along  with smaller phrase technology. Any transcriptional errors that result from this process are unintentional.

## 2019-09-17 NOTE — Patient Instructions (Addendum)
  [address] [place], [place] [address] Phone [phone]  Happy Holidays! Injection in left knee today See me again in 4 weeks

## 2019-09-29 ENCOUNTER — Other Ambulatory Visit: Payer: Self-pay | Admitting: Obstetrics and Gynecology

## 2019-09-29 ENCOUNTER — Other Ambulatory Visit: Payer: Self-pay

## 2019-09-29 ENCOUNTER — Ambulatory Visit
Admission: RE | Admit: 2019-09-29 | Discharge: 2019-09-29 | Disposition: A | Discharge status: Home / Self Care | Payer: 59 | Source: Ambulatory Visit | Attending: Obstetrics and Gynecology | Admitting: Obstetrics and Gynecology

## 2019-09-29 DIAGNOSIS — N6320 Unspecified lump in the left breast, unspecified quadrant: Secondary | ICD-10-CM

## 2019-10-13 ENCOUNTER — Ambulatory Visit: Payer: Self-pay | Admitting: *Deleted

## 2019-10-13 NOTE — Telephone Encounter (Signed)
Patient calling dx Covid Friday at [facility]. Not in distress but notices SOB with walking/activity, none with sitting. No wheezing/chest tightness/CP.Dry coughing all day sometimes turns to coughing fits/spells throughout the day and night. Has had nausea since diagnosed with Covid. No fever but chills often. Requesting breathing inhaler and cough medication. Has tried phenergan for nausea but does not help. Requesting other medication to help with nausea. No vomiting/diarrhea. Routing to provider for consideration.  Pharmacy on file, [facility].   Reason for Disposition . [1] Continuous (nonstop) coughing AND [2] keeps from working or sleeping  Answer Assessment - Initial Assessment Questions 1. RESPIRATORY STATUS: "Describe your breathing?" (e.g., wheezing, shortness of breath, unable to speak, severe coughing)      Short of breath, coughing a lot, coughing fits 2. ONSET: "When did this breathing problem begin?"      Saturday, diagnosed with covid Friday. 3. PATTERN "Does the difficult breathing come and go, or has it been constant since it started?"      Comes and goes with walking and the coughing starts 4. SEVERITY: "How bad is your breathing?" (e.g., mild, moderate, severe)    - MILD: No SOB at rest, mild SOB with walking, speaks normally in sentences, can lay down, no retractions, pulse < 100.    - MODERATE: SOB at rest, SOB with minimal exertion and prefers to sit, cannot lie down flat, speaks in phrases, mild retractions, audible wheezing, pulse 100-120.    - SEVERE: Very SOB at rest, speaks in single words, struggling to breathe, sitting hunched forward, retractions, pulse > 120      Not SOB while sitting 5. RECURRENT SYMPTOM: "Have you had difficulty breathing before?" If so, ask: "When was the last time?" and "What happened that time?"      no 6. CARDIAC HISTORY: "Do you have any history of heart disease?" (e.g., heart attack, angina, bypass surgery, angioplasty)      no 7. LUNG  HISTORY: "Do you have any history of lung disease?"  (e.g., pulmonary embolus, asthma, emphysema)     no 8. CAUSE: "What do you think is causing the breathing problem?"      possible 9. OTHER SYMPTOMS: "Do you have any other symptoms? (e.g., dizziness, runny nose, cough, chest pain, fever)     Chills no fever, cough 10. PREGNANCY: "Is there any chance you are pregnant?" "When was your last menstrual period?"       na 11. TRAVEL: "Have you traveled out of the country in the last month?" (e.g., travel history, exposures)       no  Protocols used: BREATHING DIFFICULTY-A-AH

## 2019-10-14 ENCOUNTER — Telehealth: Payer: Self-pay | Admitting: *Deleted

## 2019-10-14 MED ORDER — BENZONATATE 100 MG PO CAPS: 100.0000 mg | ORAL_CAPSULE | Freq: Two times a day (BID) | ORAL | 2 refills | Status: DC | PRN

## 2019-10-14 MED ORDER — ALBUTEROL SULFATE HFA 108 (90 BASE) MCG/ACT IN AERS: 2.0000 | INHALATION_SPRAY | Freq: Four times a day (QID) | RESPIRATORY_TRACT | 1 refills | Status: DC | PRN

## 2019-10-14 MED ORDER — ONDANSETRON HCL 4 MG PO TABS: 4.0000 mg | ORAL_TABLET | Freq: Three times a day (TID) | ORAL | 1 refills | Status: DC | PRN

## 2019-10-14 NOTE — Telephone Encounter (Signed)
All done erx

## 2019-10-14 NOTE — Telephone Encounter (Signed)
Informed patient of prescriptions sent in by Dr. Jenny Reichmann. Advised starting all medications as soon as possible today. Encourage sips of sport drink hourly after taking the zofran this morning. Instructed on inhaler use. Call back if no improvement with symptoms after 24-48 hours.

## 2019-10-14 NOTE — Telephone Encounter (Signed)
Notified pt w/MD response.../lmb 

## 2019-10-14 NOTE — Telephone Encounter (Signed)
[place], Lauren Wolfe Lauren Wolfe, Lauren Wolfe, Lauren Wolfe  Caller: Unspecified Lauren Wolfe, 4:38 PM)  ----- Message from Tarry Kos, RN sent at 10/13/2019 4:58 PM EST -----  Patient is positive Covid and requesting inhaler/cough/nausea medication if possible.

## 2019-10-15 ENCOUNTER — Telehealth: Payer: Self-pay | Admitting: Internal Medicine

## 2019-10-15 ENCOUNTER — Ambulatory Visit: Payer: 59 | Admitting: Family Medicine

## 2019-10-15 NOTE — Telephone Encounter (Signed)
LVM for Lauren Wolfe to inform I do not have any forms for this patient. If something is needing to be completed he will need to fax it over to office at [phone].

## 2019-10-15 NOTE — Telephone Encounter (Signed)
Copied from [place] [id number]. Topic: General - Other >> Oct 15, 2019  9:44 AM Keene Breath wrote: Reason for CRM: Called regarding a disability claim.  Ref# [id number], CB# [id number]

## 2019-10-16 ENCOUNTER — Telehealth: Payer: Self-pay | Admitting: Internal Medicine

## 2019-10-16 NOTE — Telephone Encounter (Signed)
See 10/13/19 tele encounter. You Rx'd Albuterol, Zofran and Tessalon pearls and pt states they are not helping her.  She was dx'd with [facility] on [facility]. Please advise.

## 2019-10-16 NOTE — Telephone Encounter (Signed)
Copied from [place] [id number]. Topic: General - Other >> Oct 16, 2019 12:39 PM Keene Breath wrote: Reason for CRM: Patient is calling to inform the doctor that the medicine he prescribed is not working.  She is still coughing and has a fever.  Please advise and call patient back at [phone]

## 2019-10-16 NOTE — Telephone Encounter (Signed)
Sorry I had missed that  Unfortunately there are no better meds, and if she has worsening sob she should go to ED or UC

## 2019-10-16 NOTE — Telephone Encounter (Signed)
I am confused since the chart does not show my recent involvement for any respiratory infection.  Perhaps she meant to contact a different provider

## 2019-10-17 NOTE — Telephone Encounter (Signed)
Pt informed of below.  

## 2019-10-18 ENCOUNTER — Inpatient Hospital Stay (HOSPITAL_BASED_OUTPATIENT_CLINIC_OR_DEPARTMENT_OTHER)
Admission: EM | Admit: 2019-10-18 | Discharge: 2019-10-22 | DRG: 177 | Disposition: A | Discharge status: Home / Self Care | Payer: 59 | Attending: Internal Medicine | Admitting: Internal Medicine

## 2019-10-18 ENCOUNTER — Emergency Department (HOSPITAL_BASED_OUTPATIENT_CLINIC_OR_DEPARTMENT_OTHER): Payer: 59

## 2019-10-18 ENCOUNTER — Encounter (HOSPITAL_BASED_OUTPATIENT_CLINIC_OR_DEPARTMENT_OTHER): Payer: Self-pay

## 2019-10-18 ENCOUNTER — Other Ambulatory Visit: Payer: Self-pay

## 2019-10-18 DIAGNOSIS — J9600 Acute respiratory failure, unspecified whether with hypoxia or hypercapnia: Secondary | ICD-10-CM | POA: Diagnosis not present

## 2019-10-18 DIAGNOSIS — J1282 Pneumonia due to coronavirus disease 2019: Secondary | ICD-10-CM | POA: Diagnosis present

## 2019-10-18 DIAGNOSIS — J9601 Acute respiratory failure with hypoxia: Secondary | ICD-10-CM | POA: Diagnosis present

## 2019-10-18 DIAGNOSIS — Z79899 Other long term (current) drug therapy: Secondary | ICD-10-CM | POA: Diagnosis not present

## 2019-10-18 DIAGNOSIS — U071 COVID-19: Principal | ICD-10-CM | POA: Diagnosis present

## 2019-10-18 DIAGNOSIS — Z7952 Long term (current) use of systemic steroids: Secondary | ICD-10-CM

## 2019-10-18 DIAGNOSIS — Z8616 Personal history of COVID-19: Secondary | ICD-10-CM | POA: Diagnosis present

## 2019-10-18 DIAGNOSIS — G43709 Chronic migraine without aura, not intractable, without status migrainosus: Secondary | ICD-10-CM | POA: Diagnosis present

## 2019-10-18 LAB — COMPREHENSIVE METABOLIC PANEL
ALT: 37 U/L (ref 0–44)
AST: 36 U/L (ref 15–41)
Albumin: 3.4 g/dL — ABNORMAL LOW (ref 3.5–5.0)
Alkaline Phosphatase: 54 U/L (ref 38–126)
Anion gap: 11 (ref 5–15)
BUN: 12 mg/dL (ref 6–20)
CO2: 27 mmol/L (ref 22–32)
Calcium: 8.9 mg/dL (ref 8.9–10.3)
Chloride: 102 mmol/L (ref 98–111)
Creatinine, Ser: 0.87 mg/dL (ref 0.44–1.00)
GFR calc Af Amer: >60 mL/min (ref >60)
GFR calc non Af Amer: >60 mL/min (ref >60)
Glucose, Bld: 92 mg/dL (ref 70–99)
Potassium: 3.6 mmol/L (ref 3.5–5.1)
Sodium: 140 mmol/L (ref 135–145)
Total Bilirubin: 0.3 mg/dL (ref 0.3–1.2)
Total Protein: 7.5 g/dL (ref 6.5–8.1)

## 2019-10-18 LAB — TRIGLYCERIDES: Triglycerides: 162 mg/dL — ABNORMAL HIGH (ref <150)

## 2019-10-18 LAB — CBC WITH DIFFERENTIAL/PLATELET
Abs Immature Granulocytes: 0.07 10*3/uL (ref 0.00–0.07)
Basophils Absolute: 0 10*3/uL (ref 0.0–0.1)
Basophils Relative: 1 %
Eosinophils Absolute: 0 10*3/uL (ref 0.0–0.5)
Eosinophils Relative: 1 %
HCT: 44.4 % (ref 36.0–46.0)
Hemoglobin: 14.3 g/dL (ref 12.0–15.0)
Immature Granulocytes: 1 %
Lymphocytes Relative: 33 %
Lymphs Abs: 1.7 10*3/uL (ref 0.7–4.0)
MCH: 28.8 pg (ref 26.0–34.0)
MCHC: 32.2 g/dL (ref 30.0–36.0)
MCV: 89.5 fL (ref 80.0–100.0)
Monocytes Absolute: 0.6 10*3/uL (ref 0.1–1.0)
Monocytes Relative: 11 %
Neutro Abs: 2.9 10*3/uL (ref 1.7–7.7)
Neutrophils Relative %: 53 %
Platelets: 415 10*3/uL — ABNORMAL HIGH (ref 150–400)
RBC: 4.96 MIL/uL (ref 3.87–5.11)
RDW: 12.6 % (ref 11.5–15.5)
Smear Review: NORMAL
WBC Morphology: ABNORMAL
WBC: 5.3 10*3/uL (ref 4.0–10.5)
nRBC: 0 % (ref 0.0–0.2)

## 2019-10-18 LAB — FIBRINOGEN: Fibrinogen: 710 mg/dL — ABNORMAL HIGH (ref 210–475)

## 2019-10-18 LAB — LACTATE DEHYDROGENASE: LDH: 272 U/L — ABNORMAL HIGH (ref 98–192)

## 2019-10-18 LAB — C-REACTIVE PROTEIN: CRP: 12.1 mg/dL — ABNORMAL HIGH (ref <1.0)

## 2019-10-18 LAB — D-DIMER, QUANTITATIVE: D-Dimer, Quant: 1.59 ug/mL-FEU — ABNORMAL HIGH (ref 0.00–0.50)

## 2019-10-18 LAB — LACTIC ACID, PLASMA: Lactic Acid, Venous: 0.8 mmol/L (ref 0.5–1.9)

## 2019-10-18 LAB — FERRITIN: Ferritin: 468 ng/mL — ABNORMAL HIGH (ref 11–307)

## 2019-10-18 MED ORDER — IOHEXOL 350 MG/ML SOLN
100.0000 mL | Freq: Once | INTRAVENOUS | Status: AC | PRN
  Administered 2019-10-18: 100 mL via INTRAVENOUS

## 2019-10-18 NOTE — ED Notes (Signed)
Pt ambulated to room with SpO2 monitor and dropped to 87% with HR increasing to 110.

## 2019-10-18 NOTE — ED Triage Notes (Signed)
Pt started having COVID symptoms on 1/4 and tested positive on 1/8. This week pt has been having increased ShOB. Pt noted to be speaking in abbreviated sentences between breaths and has dyspnea on exertion.

## 2019-10-18 NOTE — ED Notes (Signed)
Pt on 2L O2

## 2019-10-18 NOTE — ED Provider Notes (Addendum)
[facility] Provider Note   CSN: [id number] Arrival date & time: 10/18/19  1908     History Chief Complaint  Patient presents with  . COVID Positive    Lauren Wolfe is a 44 y.o. female presenting for evaluation of shortness of breath.  Patient states she developed Covid symptoms on January 4.  She was tested on the sixth, tested positive.  Over the past week, she has had worsened shortness of breath.  It is worse with movement.  It is to the point where she cannot get from her bed to her bathroom without feeling extremely fatigued and dyspneic.  She also reports left upper chest pain, not worse with inspiration or movement.  She called her doctor, who prescribed her albuterol and Tessalon without improvement of symptoms.  She has had intermittent fevers for the past week, none currently.  She had 1 day of loss of taste and smell, and has since returned.  She reports intermittent cough.  She denies nausea, vomiting, domino pain, urinary symptoms, normal bowel movements.  She has a history of migraines for which she takes medications, no other medical problems.  She denies tobacco use.  She denies recent travel, surgeries, immobilization, history of cancer, history of previous DVT/PE, or hormone use.    HPI     Past Medical History:  Diagnosis Date  . Anxiety   . Arm vein blood clot, right   . Migraine   . Wears glasses    and contacts    Patient Active Problem List   Diagnosis Date Noted  . Abnormal CT of the chest 04/21/2019  . Chest pain 04/21/2019  . Hypertrophy, fat pad, infrapatellar 03/20/2019  . Left knee pain 03/04/2019  . Left groin pain 03/04/2019  . Low back pain 03/04/2019  . HLD (hyperlipidemia) 10/21/2018  . Chronic migraine 04/23/2017  . Rash 04/19/2017  . Arm DVT (deep venous thromboembolism), acute, right ([place]) 02/22/2017  . Occipital headache 07/17/2013  . Impingement syndrome of right shoulder 03/04/2013  .  Preventative health care 09/24/2012  . Essential hypertension 06/16/2009  . Anxiety state 05/23/2009  . DEPRESSION 05/23/2009  . INSOMNIA-SLEEP DISORDER-UNSPEC 05/19/2009  . Migraine with intractable migraine, so stated, with status migrainosus 04/07/2009  . ALOPECIA 11/30/2008    Past Surgical History:  Procedure Laterality Date  . BREAST BIOPSY  10/31/2012   Procedure: BREAST BIOPSY WITH NEEDLE LOCALIZATION;  Surgeon: Rolm Bookbinder, MD;  Location: [facility];  Service: General;  Laterality: Right;  right breast wire guided excisional biopsy  . BREAST EXCISIONAL BIOPSY Right 2014  . ganglion cysts  2011  . WRIST SURGERY Left      OB History   No obstetric history on file.     Family History  Problem Relation Age of Onset  . Hypertension Mother   . Aneurysm Mother   . Stomach cancer Father   . Other Other        grandparent with DJD  . Hypertension Other        grandparent  . Diabetes Other        grandparent  . Gout Other        grandfather  . Breast cancer Other        grandmother  . Migraines Paternal Aunt   . Breast cancer Cousin   . Breast cancer Maternal Grandmother     Social History   Tobacco Use  . Smoking status: Never Smoker  . Smokeless tobacco: Never  Used  Substance Use Topics  . Alcohol use: No  . Drug use: No    Home Medications Prior to Admission medications   Medication Sig Start Date End Date Taking? Authorizing Provider  albuterol (VENTOLIN HFA) 108 (90 Base) MCG/ACT inhaler Inhale 2 puffs into the lungs every 6 (six) hours as needed for wheezing or shortness of breath. 10/14/19   Biagio Borg, MD  benzonatate (TESSALON PERLES) 100 MG capsule Take 1 capsule (100 mg total) by mouth 2 (two) times daily as needed for cough. 10/14/19 10/13/20  Biagio Borg, MD  Diclofenac Sodium (PENNSAID) 2 % SOLN Place 2 g onto the skin 2 (two) times daily. 03/20/19   Lyndal Pulley, DO  Fremanezumab-vfrm (AJOVY) 225 MG/1.5ML SOSY Inject  225 mg into the skin every 30 (thirty) days. 06/30/19   Marcial Pacas, MD  ibuprofen (ADVIL) 600 MG tablet TAKE 1 TABLET (600 MG TOTAL) BY MOUTH EVERY 8 (EIGHT) HOURS. 07/08/19   Hudnall, Sharyn Lull, MD  levonorgestrel (MIRENA) 20 MCG/24HR IUD 1 each by Intrauterine route once.    [provider]  methocarbamol (ROBAXIN) 750 MG tablet Take 1 tablet (750 mg total) by mouth 3 (three) times daily as needed (muscle spasm/pain). 06/30/19   Marcial Pacas, MD  ondansetron (ZOFRAN) 4 MG tablet Take 1 tablet (4 mg total) by mouth every 8 (eight) hours as needed for nausea or vomiting. 10/14/19   Biagio Borg, MD  ondansetron (ZOFRAN-ODT) 8 MG disintegrating tablet Take 1 tablet (8 mg total) by mouth every 8 (eight) hours as needed for nausea or vomiting. 06/30/19   Marcial Pacas, MD  SUMAtriptan (IMITREX) 6 MG/0.5ML SOLN injection One injection at onset of migraine.  May repeat in 2 hrs, if needed.  Max dose: 2 inj/day. This is a 30 day prescription. 06/30/19   Marcial Pacas, MD  tiZANidine (ZANAFLEX) 4 MG tablet TAKE ONE TABLET BY MOUTH THREE TIMES A DAY AS NEEDED FOR MIGRAINES FOR 30 DAYS 06/30/19   Marcial Pacas, MD  traMADol (ULTRAM) 50 MG tablet Take 1 tablet (50 mg total) by mouth every 6 (six) hours as needed. 03/04/19   Biagio Borg, MD  triamcinolone cream (KENALOG) 0.1 % APPLY TO AFFECTED AREA TWICE A DAY 05/09/19   Biagio Borg, MD  Vitamin D, Ergocalciferol, (DRISDOL) 1.25 MG (50000 UT) CAPS capsule TAKE 1 CAPSULE (50,000 UNITS TOTAL) BY MOUTH EVERY 7 (SEVEN) DAYS. 08/15/19   Lyndal Pulley, DO    Allergies    Azithromycin, Codeine, Hydrocodone, Ketorolac tromethamine, Prednisone, and Tramadol  Review of Systems   Review of Systems  Constitutional: Positive for fever.  Respiratory: Positive for cough and shortness of breath.   Cardiovascular: Positive for chest pain.  All other systems reviewed and are negative.   Physical Exam Updated Vital Signs BP 129/74 (BP Location: Right Arm)   Pulse 96   Temp  98.7 F (37.1 C) (Oral)   Resp (!) 32   Ht 5' 6"  (1.676 m)   Wt 97.5 kg   SpO2 98%   BMI 34.70 kg/m   Physical Exam Vitals and nursing note reviewed.  Constitutional:      General: She is not in acute distress.    Appearance: She is well-developed.     Comments: Appears sob, but nontoxic.  HENT:     Head: Normocephalic and atraumatic.  Eyes:     Extraocular Movements: Extraocular movements intact.     Conjunctiva/sclera: Conjunctivae normal.     Pupils: Pupils  are equal, round, and reactive to light.  Cardiovascular:     Rate and Rhythm: Normal rate and regular rhythm.     Pulses: Normal pulses.  Pulmonary:     Effort: Pulmonary effort is normal. Tachypnea present.     Breath sounds: Normal breath sounds. No wheezing.     Comments: Patient tachypneic.  Sats 90% on room air, drops with any movement. Speaking in short sentences. Clear lung sounds.  TTP of L upper chest wall. Chest:     Chest wall: Tenderness present.  Abdominal:     General: There is no distension.     Palpations: Abdomen is soft. There is no mass.     Tenderness: There is no abdominal tenderness. There is no guarding or rebound.  Musculoskeletal:        General: Normal range of motion.     Cervical back: Normal range of motion and neck supple.     Right lower leg: No edema.     Left lower leg: No edema.  Skin:    General: Skin is warm and dry.     Capillary Refill: Capillary refill takes less than 2 seconds.  Neurological:     Mental Status: She is alert and oriented to person, place, and time.     ED Results / Procedures / Treatments   Labs (all labs ordered are listed, but only abnormal results are displayed) Labs Reviewed  CBC WITH DIFFERENTIAL/PLATELET - Abnormal; Notable for the following components:      Result Value   Platelets 415 (*)    All other components within normal limits  COMPREHENSIVE METABOLIC PANEL - Abnormal; Notable for the following components:   Albumin 3.4 (*)    All  other components within normal limits  D-DIMER, QUANTITATIVE (NOT AT [facility]) - Abnormal; Notable for the following components:   D-Dimer, Quant 1.59 (*)    All other components within normal limits  CULTURE, BLOOD (ROUTINE X 2)  CULTURE, BLOOD (ROUTINE X 2)  LACTIC ACID, PLASMA  LACTIC ACID, PLASMA  PROCALCITONIN  LACTATE DEHYDROGENASE  FERRITIN  TRIGLYCERIDES  FIBRINOGEN  C-REACTIVE PROTEIN  PREGNANCY, URINE    EKG EKG Interpretation  Date/Time:  Saturday October 18 2019 20:56:02 EST Ventricular Rate:  84 PR Interval:    QRS Duration: 92 QT Interval:  398 QTC Calculation: 471 R Axis:   50 Text Interpretation: Sinus rhythm Probable left atrial enlargement Nonspecific T abnormalities, anterior leads No significant change since 7/20 Confirmed by Aletta Edouard [phone]) on 10/18/2019 9:11:26 PM   Radiology DG Chest Portable 1 View  Result Date: 10/18/2019 CLINICAL DATA:  Shortness of breath, COVID-19 positive EXAM: PORTABLE CHEST 1 VIEW COMPARISON:  04/20/2019 FINDINGS: The heart size and mediastinal contours are within normal limits. Lung volumes are low. There are streaky perihilar and bibasilar opacities. No pneumothorax. The visualized skeletal structures are unremarkable. IMPRESSION: Low lung volumes with streaky perihilar and bibasilar opacities, which may reflect atelectasis and/or pneumonia. Electronically Signed   By: Davina Poke D.O.   On: 10/18/2019 20:00    Procedures .Critical Care Performed by: Franchot Heidelberg, PA-C Authorized by: Franchot Heidelberg, PA-C   Critical care provider statement:    Critical care time (minutes):  35   Critical care time was exclusive of:  Separately billable procedures and treating other patients and teaching time   Critical care was necessary to treat or prevent imminent or life-threatening deterioration of the following conditions:  Respiratory failure   Critical care was time spent personally  by me on the following activities:   Blood draw for specimens, development of treatment plan with patient or surrogate, examination of patient, evaluation of patient's response to treatment, obtaining history from patient or surrogate, ordering and performing treatments and interventions, ordering and review of laboratory studies, ordering and review of radiographic studies, pulse oximetry, re-evaluation of patient's condition and review of old charts   I assumed direction of critical care for this patient from another provider in my specialty: no   Comments:     Pt with covid 19 infection requiring O2 and hospitalization.    (including critical care time)  Medications Ordered in ED Medications  iohexol (OMNIPAQUE) 350 MG/ML injection 100 mL (has no administration in time range)    ED Course  I have reviewed the triage vital signs and the nursing notes.  Pertinent labs & imaging results that were available during my care of the patient were reviewed by me and considered in my medical decision making (see chart for details).  Clinical Course as of Oct 17 2152  Sat Oct 17, 6984  4468 44 year old female recently diagnosed with Covid about a week ago here with increased dyspnea on exertion shortness of breath cough.  Tachypneic and borderline hypoxia here.  Getting labs chest x-ray EKG.  Anticipate admission.   [MB]    Clinical Course User Index [MB] Hayden Rasmussen, MD   MDM Rules/Calculators/A&P                      Patient presenting for evaluation of shortness of breath in the setting of Covid.  Physical exam shows patient who does appear short of breath and tachypneic.  Speaking in short sentences.  Sats 90% on room air without movement, dropped with any movements.  Patient placed on 2 L, appears more comfortable and sats at the mid 90s.  I am concerned about patient's worsening respiratory status in the setting of Covid infection.  Will obtain x-ray and labs.  Consider PE due to worsening shortness of breath and chest  pain which began later in the course of Covid.  X-ray viewed interpreted by me, shows bibasilar opacities concerning for atelectasis versus pneumonia.  Labs overall reassuring.  CTA pending.  Will call for admission.  Discussed with Dr. Jonelle Sidle from [facility] hospital[facility], patient to be admitted at [facility] was evaluated in Emergency Department on 10/18/2019 for the symptoms described in the history of present illness. She was evaluated in the context of the global COVID-19 pandemic, which necessitated consideration that the patient might be at risk for infection with the SARS-CoV-2 virus that causes COVID-19. Institutional protocols and algorithms that pertain to the evaluation of patients at risk for COVID-19 are in a state of rapid change based on information released by regulatory bodies including the CDC and federal and state organizations. These policies and algorithms were followed during the patient's care in the ED.   Final Clinical Impression(s) / ED Diagnoses Final diagnoses:  [id number]    Rx / DC Orders ED Discharge Orders    None       Franchot Heidelberg, PA-C 10/18/19 2159    Franchot Heidelberg, PA-C 10/18/19 2201    Hayden Rasmussen, MD 10/19/19 [phone]

## 2019-10-18 NOTE — ED Notes (Signed)
ED Provider at bedside. 

## 2019-10-18 NOTE — ED Notes (Signed)
ED Provider at bedside. Dr Melina Copa

## 2019-10-18 NOTE — ED Notes (Signed)
Patient transported to CT 

## 2019-10-19 ENCOUNTER — Encounter (HOSPITAL_COMMUNITY): Payer: Self-pay | Admitting: Internal Medicine

## 2019-10-19 LAB — PROCALCITONIN: Procalcitonin: <0.1 ng/mL

## 2019-10-19 LAB — PREGNANCY, URINE: Preg Test, Ur: NEGATIVE

## 2019-10-19 MED ORDER — SODIUM CHLORIDE 0.9 % IV SOLN: 100.0000 mg | Freq: Every day | INTRAVENOUS | Status: DC

## 2019-10-19 MED ORDER — SODIUM CHLORIDE 0.9 % IV SOLN: 100.0000 mg | INTRAVENOUS | Status: DC

## 2019-10-19 MED ORDER — SODIUM CHLORIDE 0.9 % IV SOLN
100.0000 mg | Freq: Every day | INTRAVENOUS | Status: DC
  Administered 2019-10-20 – 2019-10-22 (×3): 100 mg via INTRAVENOUS
  Filled 2019-10-19 (×3): qty 20

## 2019-10-19 MED ORDER — SODIUM CHLORIDE 0.9 % IV SOLN
200.0000 mg | Freq: Once | INTRAVENOUS | Status: DC
  Filled 2019-10-19: qty 40

## 2019-10-19 MED ORDER — ENOXAPARIN SODIUM 40 MG/0.4ML ~~LOC~~ SOLN
40.0000 mg | SUBCUTANEOUS | Status: DC
  Administered 2019-10-19 – 2019-10-21 (×3): 40 mg via SUBCUTANEOUS
  Filled 2019-10-19 (×3): qty 0.4

## 2019-10-19 MED ORDER — ACETAMINOPHEN 325 MG PO TABS
650.0000 mg | ORAL_TABLET | Freq: Once | ORAL | Status: AC
  Administered 2019-10-19: 650 mg via ORAL
  Filled 2019-10-19: qty 2

## 2019-10-19 MED ORDER — GUAIFENESIN-DM 100-10 MG/5ML PO SYRP
10.0000 mL | ORAL_SOLUTION | ORAL | Status: DC | PRN
  Administered 2019-10-22: 10 mL via ORAL
  Filled 2019-10-19 (×2): qty 10

## 2019-10-19 MED ORDER — ALBUTEROL SULFATE HFA 108 (90 BASE) MCG/ACT IN AERS: 2.0000 | INHALATION_SPRAY | RESPIRATORY_TRACT | Status: DC | PRN

## 2019-10-19 MED ORDER — IBUPROFEN 400 MG PO TABS
600.0000 mg | ORAL_TABLET | Freq: Once | ORAL | Status: AC
  Administered 2019-10-19: 600 mg via ORAL
  Filled 2019-10-19: qty 1

## 2019-10-19 MED ORDER — SODIUM CHLORIDE 0.9 % IV SOLN
100.0000 mg | INTRAVENOUS | Status: AC
  Administered 2019-10-19 (×2): 100 mg via INTRAVENOUS
  Filled 2019-10-19: qty 20

## 2019-10-19 NOTE — ED Notes (Signed)
Pt resting quietly at this time.  Awaiting inpatient bed assignment

## 2019-10-19 NOTE — ED Notes (Signed)
Pt provided food that was brought in by family

## 2019-10-19 NOTE — ED Notes (Signed)
Spoke with daughter Hilma Favors regarding pts status.

## 2019-10-19 NOTE — ED Notes (Signed)
Encouraged patient to prone as tolerated.

## 2019-10-19 NOTE — ED Notes (Signed)
Pts daughter wants EDP to speak with pt regarding transfer to a different hosp. EDP Ralene Bathe notified

## 2019-10-19 NOTE — ED Notes (Addendum)
ED Provider at [place], [place]

## 2019-10-19 NOTE — ED Provider Notes (Addendum)
Patient has been admitted to the hospital for Covid.  Requiring 2 L of oxygen.  She remained stable on oxygen, no increased shortness of breath.  She is comfortable at rest, does have slight shortness of breath with exertion.  CTA from yesterday was negative for PE, showed multifocal pneumonia likely due to Covid.  She has received remdesivir. Will start pt on VTE prophylaxis. Pt has listed allergy to prednsione.  Discussed continued plan of admission with patient and pending bed placement.  Patient is agreeable.    Franchot Heidelberg, PA-C 10/19/19 1613    Franchot Heidelberg, PA-C 10/19/19 1614    Quintella Reichert, MD 10/19/19 2354

## 2019-10-19 NOTE — ED Notes (Signed)
Call [facility] spoke to Abbe Amsterdam to transport pt @ 1755

## 2019-10-19 NOTE — ED Notes (Signed)
Significant increase in work of breathing with transfer from bed to commode. Transferring also initiates coughing fit that makes patient anxious. Robitussin DM not available at this facility - coughing resolved with rest. Purewick placed in order to reduce wob for toileting. Pt agreeable.   Pt also endorsing discomfort with stretcher mattress. Placed eggcrate at this time. Pt reports relief.

## 2019-10-19 NOTE — ED Notes (Signed)
Pt reports "slight headache". Consulted EDP, see new orders.

## 2019-10-20 DIAGNOSIS — U071 COVID-19: Principal | ICD-10-CM

## 2019-10-20 DIAGNOSIS — J9600 Acute respiratory failure, unspecified whether with hypoxia or hypercapnia: Secondary | ICD-10-CM

## 2019-10-20 LAB — HIV ANTIBODY (ROUTINE TESTING W REFLEX): HIV Screen 4th Generation wRfx: NONREACTIVE

## 2019-10-20 MED ORDER — ONDANSETRON HCL 4 MG/2ML IJ SOLN
4.0000 mg | Freq: Three times a day (TID) | INTRAMUSCULAR | Status: DC | PRN
  Administered 2019-10-20 – 2019-10-21 (×2): 4 mg via INTRAVENOUS
  Filled 2019-10-20 (×2): qty 2

## 2019-10-20 MED ORDER — SUMATRIPTAN SUCCINATE 6 MG/0.5ML ~~LOC~~ SOLN: 6.0000 mg | SUBCUTANEOUS | Status: DC | PRN

## 2019-10-20 MED ORDER — SUMATRIPTAN SUCCINATE 6 MG/0.5ML ~~LOC~~ SOLN
6.0000 mg | Freq: Every day | SUBCUTANEOUS | Status: DC | PRN
  Administered 2019-10-20: 6 mg via SUBCUTANEOUS
  Filled 2019-10-20 (×2): qty 0.5

## 2019-10-20 MED ORDER — LEVOFLOXACIN IN D5W 750 MG/150ML IV SOLN
750.0000 mg | Freq: Every day | INTRAVENOUS | Status: DC
  Administered 2019-10-20: 750 mg via INTRAVENOUS
  Filled 2019-10-20: qty 150

## 2019-10-20 MED ORDER — METHOCARBAMOL 500 MG PO TABS: 750.0000 mg | ORAL_TABLET | Freq: Three times a day (TID) | ORAL | Status: DC | PRN

## 2019-10-20 MED ORDER — ADULT MULTIVITAMIN W/MINERALS CH
1.0000 | ORAL_TABLET | Freq: Every day | ORAL | Status: DC
  Administered 2019-10-20 – 2019-10-22 (×3): 1 via ORAL
  Filled 2019-10-20 (×3): qty 1

## 2019-10-20 MED ORDER — VITAMIN D (ERGOCALCIFEROL) 1.25 MG (50000 UNIT) PO CAPS: 50000.0000 [IU] | ORAL_CAPSULE | ORAL | Status: DC

## 2019-10-20 MED ORDER — ONDANSETRON HCL 4 MG PO TABS: 4.0000 mg | ORAL_TABLET | Freq: Three times a day (TID) | ORAL | Status: DC | PRN

## 2019-10-20 MED ORDER — TRIAMCINOLONE ACETONIDE 0.1 % EX CREA
1.0000 "application " | TOPICAL_CREAM | Freq: Two times a day (BID) | CUTANEOUS | Status: DC
  Administered 2019-10-20 – 2019-10-22 (×5): 1 via TOPICAL
  Filled 2019-10-20: qty 15

## 2019-10-20 MED ORDER — ENSURE MAX PROTEIN PO LIQD
11.0000 [oz_av] | Freq: Every day | ORAL | Status: DC
  Administered 2019-10-20 – 2019-10-22 (×3): 11 [oz_av] via ORAL
  Filled 2019-10-20 (×3): qty 330

## 2019-10-20 NOTE — Progress Notes (Signed)
Chart reviewed.  Patient seen and updated on plan of care.  Budd Palmer, MD Internal Medicine  Hospitalist

## 2019-10-20 NOTE — H&P (Signed)
History and Physical    Lauren Wolfe:[id number] DOB: [birth year] DOA: 10/18/2019  PCP: Biagio Borg, MD (Confirm with patient/family/NH records and if not entered, this has to be entered at [facility] point of entry) Patient coming from: [place] ED  I have personally briefly reviewed patient's old medical records in [place]  Chief Complaint: Acute respiratory failure secondary to Covid 19 pneumonia  HPI: Lauren Wolfe is a 44 y.o. female with medical history significant of migraine headaches who is transferred from [place] ED for management of acute respiratory failure secondary to COVID-19 pneumonia.  Patient states that she started having difficulty with breathing and severe exertional dyspnea on October 06, 2019 and was tested positive on October 10, 2019 from [facility].  Patient states that she contacted her primary care physician for shortness of breath and dry cough and he prescribed albuterol inhaler and Tessalon Perles for cough but her condition continue to worsen and she went to ED department where it was found that her oxygen saturation is 87% on room air and she was started on 2 L of oxygen with nasal cannula to maintain oxygen saturation within normal limits.  Patient states that she is feeling very weak and tired and she also lost taste and smell sensation earlier which has improved now.  Patient further mentioned that shortness of breath comes with exertion only and she feels well at rest.  She is complaining of episodic cough that is productive sometimes.  Patient states that she you are having chills earlier on but right now she denies fever, chills, headache, dizziness, chest pain, nausea, vomiting, abdominal pain and urinary symptoms.  ED Course: In [place] ED, chest x-ray was positive for bibasilar opacities.  D-dimer was also elevated and CT chest PE was done because of severe exertional dyspnea and hypoxia and it was negative for  pulmonary embolism.  Patient was started on oxygen supplementation, remdesivir and guanfacine cough syrup.  Patient has a history of severe allergic reaction to steroids and was not given IV Decadron  Review of Systems: As per HPI otherwise 10 point review of systems negative.    Past Medical History:  Diagnosis Date  . Anxiety   . Arm vein blood clot, right   . Knee pain, left 01/2019  . Migraine   . Wears glasses    and contacts    Past Surgical History:  Procedure Laterality Date  . BREAST BIOPSY  10/31/2012   Procedure: BREAST BIOPSY WITH NEEDLE LOCALIZATION;  Surgeon: Rolm Bookbinder, MD;  Location: [facility];  Service: General;  Laterality: Right;  right breast wire guided excisional biopsy  . BREAST EXCISIONAL BIOPSY Right 2014  . ganglion cysts  2011  . WRIST SURGERY Left      reports that she has never smoked. She has never used smokeless tobacco. She reports that she does not drink alcohol or use drugs.  Allergies  Allergen Reactions  . Azithromycin Shortness Of Breath    REACTION: rash, prurutis  . Codeine Shortness Of Breath    Causes shortness of breath, rash, itching  . Hydrocodone Shortness Of Breath    Rash, itching  . Ketorolac Tromethamine Shortness Of Breath    Itching, rash  . Prednisone     SOB  . Tramadol     itching    Family History  Problem Relation Age of Onset  . Hypertension Mother   . Aneurysm Mother   .  Stomach cancer Father   . Other Other        grandparent with DJD  . Hypertension Other        grandparent  . Diabetes Other        grandparent  . Gout Other        grandfather  . Breast cancer Other        grandmother  . Migraines Paternal Aunt   . Breast cancer Cousin   . Breast cancer Maternal Grandmother      Prior to Admission medications   Medication Sig Start Date End Date Taking? Authorizing Provider  albuterol (VENTOLIN HFA) 108 (90 Base) MCG/ACT inhaler Inhale 2 puffs into the lungs every 6 (six)  hours as needed for wheezing or shortness of breath. 10/14/19  Yes Biagio Borg, MD  benzonatate (TESSALON PERLES) 100 MG capsule Take 1 capsule (100 mg total) by mouth 2 (two) times daily as needed for cough. 10/14/19 10/13/20 Yes Biagio Borg, MD  Diclofenac Sodium (PENNSAID) 2 % SOLN Place 2 g onto the skin 2 (two) times daily. Patient taking differently: Place 2 g onto the skin 2 (two) times daily as needed (pain).  03/20/19  Yes Lyndal Pulley, DO  ibuprofen (ADVIL) 600 MG tablet TAKE 1 TABLET (600 MG TOTAL) BY MOUTH EVERY 8 (EIGHT) HOURS. Patient taking differently: Take 600 mg by mouth every 8 (eight) hours as needed for fever, headache, mild pain, moderate pain or cramping.  07/08/19  Yes Hudnall, Sharyn Lull, MD  levonorgestrel (MIRENA) 20 MCG/24HR IUD 1 each by Intrauterine route once.   Yes [provider]  methocarbamol (ROBAXIN) 750 MG tablet Take 1 tablet (750 mg total) by mouth 3 (three) times daily as needed (muscle spasm/pain). 06/30/19  Yes Marcial Pacas, MD  ondansetron (ZOFRAN) 4 MG tablet Take 1 tablet (4 mg total) by mouth every 8 (eight) hours as needed for nausea or vomiting. 10/14/19  Yes Biagio Borg, MD  ondansetron (ZOFRAN-ODT) 8 MG disintegrating tablet Take 1 tablet (8 mg total) by mouth every 8 (eight) hours as needed for nausea or vomiting. 06/30/19  Yes Marcial Pacas, MD  SUMAtriptan (IMITREX) 6 MG/0.5ML SOLN injection One injection at onset of migraine.  May repeat in 2 hrs, if needed.  Max dose: 2 inj/day. This is a 30 day prescription. 06/30/19  Yes Marcial Pacas, MD  tiZANidine (ZANAFLEX) 4 MG tablet TAKE ONE TABLET BY MOUTH THREE TIMES A DAY AS NEEDED FOR MIGRAINES FOR 30 DAYS Patient taking differently: Take 4 mg by mouth every 8 (eight) hours as needed for muscle spasms.  06/30/19  Yes Marcial Pacas, MD  traMADol (ULTRAM) 50 MG tablet Take 1 tablet (50 mg total) by mouth every 6 (six) hours as needed. Patient taking differently: Take 50 mg by mouth every 6 (six) hours as  needed for moderate pain or severe pain.  03/04/19  Yes Biagio Borg, MD  triamcinolone cream (KENALOG) 0.1 % APPLY TO AFFECTED AREA TWICE A DAY Patient taking differently: Apply 1 application topically 2 (two) times daily.  05/09/19  Yes Biagio Borg, MD  Vitamin D, Ergocalciferol, (DRISDOL) 1.25 MG (50000 UT) CAPS capsule TAKE 1 CAPSULE (50,000 UNITS TOTAL) BY MOUTH EVERY 7 (SEVEN) DAYS. 08/15/19  Yes Hulan Saas M, DO  Fremanezumab-vfrm (AJOVY) 225 MG/1.5ML SOSY Inject 225 mg into the skin every 30 (thirty) days. Patient not taking: Reported on 10/19/2019 06/30/19   Marcial Pacas, MD    Physical Exam: Vitals:   10/19/19  1845 10/19/19 1945 10/19/19 2000 10/19/19 2113  BP:    140/86  Pulse: 73 91 78 80  Resp: (!) 23 20 20 20   Temp:    98 F (36.7 C)  TempSrc:    Oral  SpO2: 100% 99% 100% 98%  Weight:    (P) 94.3 kg  Height:    (P) 5' 6"  (1.676 m)    Constitutional: NAD, calm, comfortable Vitals:   10/19/19 1845 10/19/19 1945 10/19/19 2000 10/19/19 2113  BP:    140/86  Pulse: 73 91 78 80  Resp: (!) 23 20 20 20   Temp:    98 F (36.7 C)  TempSrc:    Oral  SpO2: 100% 99% 100% 98%  Weight:    (P) 94.3 kg  Height:    (P) 5' 6"  (1.676 m)   General: Patient is a 44 year old African-American female on 2 L of oxygen with nasal cannula and not in any distress at this time. Eyes: PERRL, lids and conjunctivae normal ENMT: Mucous membranes are moist. Posterior pharynx clear of any exudate or lesions.Normal dentition.  Neck: normal, supple, no masses, no thyromegaly Respiratory: Patient is on 2 L of oxygen with nasal cannula.  Diminished breath sounds in bilateral lower lobes while no wheezes rales or rhonchi appreciated with auscultation. No accessory muscle use.  Cardiovascular: Regular rate and rhythm, no murmurs / rubs / gallops. No extremity edema. 2+ pedal pulses. No carotid bruits.  Abdomen: no tenderness, no masses palpated. No hepatosplenomegaly. Bowel sounds positive.   Musculoskeletal: no clubbing / cyanosis. No joint deformity upper and lower extremities. Good ROM, no contractures. Normal muscle tone.  Skin: no rashes, lesions, ulcers. No induration Neurologic: CN 2-12 grossly intact. Sensation intact, DTR normal. Strength 5/5 in all 4.  Psychiatric: Normal judgment and insight. Alert and oriented x 3. Normal mood.     Labs on Admission: I have personally reviewed following labs and imaging studies  CBC: Recent Labs  Lab 10/18/19 2025  WBC 5.3  NEUTROABS 2.9  HGB 14.3  HCT 44.4  MCV 89.5  PLT 517*   Basic Metabolic Panel: Recent Labs  Lab 10/18/19 2025  NA 140  K 3.6  CL 102  CO2 27  GLUCOSE 92  BUN 12  CREATININE 0.87  CALCIUM 8.9   GFR: Estimated Creatinine Clearance: 98.2 mL/min (by C-G formula based on SCr of 0.87 mg/dL). Liver Function Tests: Recent Labs  Lab 10/18/19 2025  AST 36  ALT 37  ALKPHOS 54  BILITOT 0.3  PROT 7.5  ALBUMIN 3.4*   No results for input(s): LIPASE, AMYLASE in the last 168 hours. No results for input(s): AMMONIA in the last 168 hours. Coagulation Profile: No results for input(s): INR, PROTIME in the last 168 hours. Cardiac Enzymes: No results for input(s): CKTOTAL, CKMB, CKMBINDEX, TROPONINI in the last 168 hours. BNP (last 3 results) No results for input(s): PROBNP in the last 8760 hours. HbA1C: No results for input(s): HGBA1C in the last 72 hours. CBG: No results for input(s): GLUCAP in the last 168 hours. Lipid Profile: Recent Labs    10/18/19 2025  TRIG 162*   Thyroid Function Tests: No results for input(s): TSH, T4TOTAL, FREET4, T3FREE, THYROIDAB in the last 72 hours. Anemia Panel: Recent Labs    10/18/19 2011  FERRITIN 468*   Urine analysis:    Component Value Date/Time   COLORURINE YELLOW 10/21/2018 1656   APPEARANCEUR CLEAR 10/21/2018 1656   LABSPEC 1.010 10/21/2018 1656   PHURINE 7.5 10/21/2018 1656  GLUCOSEU NEGATIVE 10/21/2018 1656   HGBUR NEGATIVE 10/21/2018  1656   BILIRUBINUR NEGATIVE 10/21/2018 1656   KETONESUR NEGATIVE 10/21/2018 1656   UROBILINOGEN 0.2 10/21/2018 1656   NITRITE NEGATIVE 10/21/2018 1656   LEUKOCYTESUR NEGATIVE 10/21/2018 1656    Radiological Exams on Admission: CT Angio Chest PE W and/or Wo Contrast  Result Date: 10/18/2019 CLINICAL DATA:  Shortness of breath, COVID-19 positive diagnosed 10/09/2018 EXAM: CT ANGIOGRAPHY CHEST WITH CONTRAST TECHNIQUE: Multidetector CT imaging of the chest was performed using the standard protocol during bolus administration of intravenous contrast. Multiplanar CT image reconstructions and MIPs were obtained to evaluate the vascular anatomy. CONTRAST:  169m OMNIPAQUE IOHEXOL 350 MG/ML SOLN COMPARISON:  Radiograph 10/18/2019, CT 05/16/2019 FINDINGS: Cardiovascular: Satisfactory opacification the pulmonary arteries to the segmental level. No pulmonary artery filling defects are identified. Central pulmonary arteries are normal caliber. Normal heart size. No pericardial effusion. The aorta is normal caliber. No acute luminal abnormality or periaortic abnormality is seen. Shared origin of the brachiocephalic and left common carotid arteries. Proximal great vessels are unremarkable. Mediastinum/Nodes: Borderline enlarged low-attenuation mediastinal and hilar nodes are present, for example a 12 mm AP window lymph node (4/26) and a 12 mm right hilar node (4/33). Thyroid gland and thoracic inlet are unremarkable. No acute abnormality of the trachea. Small hiatal hernia. Lungs/Pleura: Multifocal areas mixed ground-glass and consolidative airspace opacity are present throughout both lungs with interspersed areas of bandlike opacity likely reflecting some atelectatic change. No pneumothorax or visible pleural effusion. Upper Abdomen: Heterogeneous enhancement the spleen is normal for arterial phase. No acute abnormalities present in the visualized portions of the upper abdomen. Musculoskeletal: No acute osseous  abnormality or suspicious osseous lesion. No suspicious chest wall lesions. Review of the MIP images confirms the above findings. IMPRESSION: 1. No evidence of acute pulmonary artery embolism. 2. Multifocal areas of mixed ground-glass and consolidative airspace opacity throughout both lungs with interspersed areas of bandlike opacity likely reflecting some atelectatic change. Findings are nonspecific but can be seen with an atypical infection. 3. Borderline enlarged low-attenuation mediastinal and hilar nodes are likely reactive. Electronically Signed   By: PLovena LeM.D.   On: 10/18/2019 22:39   DG Chest Portable 1 View  Result Date: 10/18/2019 CLINICAL DATA:  Shortness of breath, COVID-19 positive EXAM: PORTABLE CHEST 1 VIEW COMPARISON:  04/20/2019 FINDINGS: The heart size and mediastinal contours are within normal limits. Lung volumes are low. There are streaky perihilar and bibasilar opacities. No pneumothorax. The visualized skeletal structures are unremarkable. IMPRESSION: Low lung volumes with streaky perihilar and bibasilar opacities, which may reflect atelectasis and/or pneumonia. Electronically Signed   By: NDavina PokeD.O.   On: 10/18/2019 20:00      Assessment/Plan    Acute respiratory failure due to COVID-19 ([facility] Continue oxygen supplementation with nasal cannula to maintain oxygen saturation within normal limits. Continue remdesivir Albuterol inhaler every 4 hours as needed for wheezing and shortness of breath. Robitussin syrup as needed for cough Tylenol as needed for fever   Migraine headache: Stable        DVT prophylaxis: Lovenox Code Status: Full code Consults called:   Admission status: Inpatient/telemetry   MEdmonia LynchMD [facility] Pager [phone]   If 7PM-7AM, please contact night-coverage [email or web address]   10/20/2019, 12:25 AM

## 2019-10-20 NOTE — Progress Notes (Signed)
Pharmacy Antibiotic Note  Lauren Wolfe is a 44 y.o. female admitted on 10/18/2019 with .  Pharmacy has been consulted for levofloxacin dosing.  Plan: Levofloxacin 723m iv q24hr  Height: (P) 5' 6"  (167.6 cm) Weight: (P) 207 lb 14.4 oz (94.3 kg) IBW/kg (Calculated) : (P) 59.3  Temp (24hrs), Avg:98.2 F (36.8 C), Min:98 F (36.7 C), Max:98.4 F (36.9 C)  Recent Labs  Lab 10/18/19 2025  WBC 5.3  CREATININE 0.87  LATICACIDVEN 0.8    Estimated Creatinine Clearance: 98.2 mL/min (by C-G formula based on SCr of 0.87 mg/dL).    Allergies  Allergen Reactions  . Azithromycin Shortness Of Breath    REACTION: rash, prurutis  . Codeine Shortness Of Breath    Causes shortness of breath, rash, itching  . Hydrocodone Shortness Of Breath    Rash, itching  . Ketorolac Tromethamine Shortness Of Breath    Itching, rash  . Prednisone     SOB  . Tramadol     itching    Antimicrobials this admission: Levofloxacin 10/20/2019 >>  Dose adjustments this admission:   Microbiology results: - Thank you for allowing pharmacy to be a part of this patient's care.  GNani SkillernCrowford 10/20/2019 3:36 AM

## 2019-10-20 NOTE — Progress Notes (Signed)
Initial Nutrition Assessment  RD working remotely.   DOCUMENTATION CODES:   Obesity unspecified  INTERVENTION:  - will order Ensure Max once/day, each supplement provides 150 kcal and 30 grams protein. - will order Magic Cup BID with meals, each supplement provides 290 kcal and 9 grams of protein. - will order daily multivitamin with minerals.    NUTRITION DIAGNOSIS:   Increased nutrient needs related to acute illness(COVID-19) as evidenced by estimated needs.  GOAL:   Patient will meet greater than or equal to 90% of their needs  MONITOR:   PO intake, Supplement acceptance, Labs, Weight trends  REASON FOR ASSESSMENT:   Malnutrition Screening Tool  ASSESSMENT:   44 y.o. female with medical history significant of migraine headaches. She transferred from [facility] ED for management of acute respiratory failure 2/2 COVID-19 PNA. She started having difficulty with breathing and severe exertional dyspnea on 1/4 and was [facility]. She contacted her PCP d/t SOB and dry cough and was prescribed albuterol inhaler, but her condition continued to worsen. She reported feeling very weak with loss of taste and smell, which has now improved.  No intakes documented since admission. Patient reported that appetite had decreased due to loss of taste and smell, but that those senses have improved/nearly returned and appetite is improving as a result. Appetite and intakes had been good/unchanged prior to becoming COVID+.   Patient feels that she has lost 15-20 lb over the past 1 month. Per chart review, weight on 1/16 was 215 lb and weight on 09/03/19 was 220 lb. This indicates 5 lb weight loss (2.3% body weight) in the past 1.5 months; not significant for time frame.   Per notes: - acute respiratory failure due to COVID-19   Labs reviewed. Medications reviewed; 100 mg IV remdesivir x2 doses on 1/17, 100 mg IV remdesivir x1 dose/day (1/18-1/21), 50000 units  drisdol x1 dose every 7 days starting 1/16.     NUTRITION - FOCUSED PHYSICAL EXAM:  unable to complete for COVID+ patient.   Diet Order:   Diet Order            Diet regular Room service appropriate? Yes; Fluid consistency: Thin  Diet effective now              EDUCATION NEEDS:   No education needs have been identified at this time  Skin:  Skin Assessment: Reviewed RN Assessment  Last BM:  1/17  Height:   Ht Readings from Last 1 Encounters:  10/19/19 (P) 5' 6"  (1.676 m)    Weight:   Wt Readings from Last 1 Encounters:  10/19/19 (P) 94.3 kg    Ideal Body Weight:  59.1 kg  BMI:  Body mass index is 33.56 kg/m (pended).  Estimated Nutritional Needs:   Kcal:  9758-8325 kcal  Protein:  100-115 grams  Fluid:  >/= 2.1 L/day     Jarome Matin, MS, RD, LDN, [facility] Inpatient Clinical Dietitian Pager # [phone] After hours/weekend pager # [phone]

## 2019-10-21 LAB — FERRITIN: Ferritin: 416 ng/mL — ABNORMAL HIGH (ref 11–307)

## 2019-10-21 LAB — D-DIMER, QUANTITATIVE: D-Dimer, Quant: 0.73 ug/mL-FEU — ABNORMAL HIGH (ref 0.00–0.50)

## 2019-10-21 LAB — C-REACTIVE PROTEIN: CRP: 2.3 mg/dL — ABNORMAL HIGH (ref <1.0)

## 2019-10-21 MED ORDER — ASCORBIC ACID 500 MG PO TABS
500.0000 mg | ORAL_TABLET | Freq: Every day | ORAL | Status: DC
  Administered 2019-10-21 – 2019-10-22 (×2): 500 mg via ORAL
  Filled 2019-10-21 (×2): qty 1

## 2019-10-21 MED ORDER — ZINC SULFATE 220 (50 ZN) MG PO CAPS
220.0000 mg | ORAL_CAPSULE | Freq: Every day | ORAL | Status: DC
  Administered 2019-10-21 – 2019-10-22 (×2): 220 mg via ORAL
  Filled 2019-10-21 (×2): qty 1

## 2019-10-21 NOTE — Evaluation (Signed)
Physical Therapy Evaluation Patient Details Name: Lauren Wolfe MRN: [id number] DOB: Nov 03, 1975 Today's Date: 10/21/2019   History of Present Illness  44 yo female admitted to [facility] from [facility] on 1/16 for acute respiratory failure secondary to covid 19. Chest CT negative for PE. PMH unremarkable.  Clinical Impression   Pt presents with decreased activity tolerance, mild to moderate dyspnea on exertion with short distance ambulation, and decreased gait speed. Pt to benefit from acute PT to address deficits. Pt ambulated multiple laps in room, with SpO2 94% and greater on RA. Pt somewhat limited by dyspnea on exertion, but moreso limited by fatigue. Pt lives with her daughter, pt completely independent PTA and PT anticipates no post-acute PT follow up. PT to continue to follow acutely for activity tolerance progression.      Follow Up Recommendations No PT follow up;Supervision for mobility/OOB    Equipment Recommendations  None recommended by PT    Recommendations for Other Services       Precautions / Restrictions Precautions Precautions: None Restrictions Weight Bearing Restrictions: No      Mobility  Bed Mobility Overal bed mobility: Modified Independent             General bed mobility comments: increased time and effort, use of bedrail.  Transfers Overall transfer level: Modified independent               General transfer comment: mod I for increased time to rise and steady.  Ambulation/Gait Ambulation/Gait assistance: Supervision Gait Distance (Feet): 60 Feet Assistive device: None Gait Pattern/deviations: Step-through pattern;Decreased stride length Gait velocity: decr   General Gait Details: supervision for safety, pt with slow and steady gait. Sats 94-95% on RA during ambulation with HR 81-98 bpm.  Stairs            Wheelchair Mobility    Modified Rankin (Stroke Patients Only)       Balance Overall balance assessment: Modified  Independent                                           Pertinent Vitals/Pain Pain Assessment: No/denies pain    Home Living Family/patient expects to be discharged to:: Private residence Living Arrangements: Children(daughter) Available Help at Discharge: Family;Available PRN/intermittently Type of Home: Apartment Home Access: Stairs to enter Entrance Stairs-Rails: Chemical engineer of Steps: 2 flights Home Layout: One level Home Equipment: None      Prior Function Level of Independence: Independent         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   Dominant Hand: Right    Extremity/Trunk Assessment   Upper Extremity Assessment Upper Extremity Assessment: Overall WFL for tasks assessed    Lower Extremity Assessment Lower Extremity Assessment: Overall WFL for tasks assessed    Cervical / Trunk Assessment Cervical / Trunk Assessment: Normal  Communication   Communication: No difficulties  Cognition Arousal/Alertness: Awake/alert Behavior During Therapy: WFL for tasks assessed/performed Overall Cognitive Status: Within Functional Limits for tasks assessed                                        General Comments      Exercises     Assessment/Plan    PT Assessment Patient needs continued PT services  PT Problem List Decreased mobility;Cardiopulmonary status limiting activity;Decreased activity tolerance       PT Treatment Interventions DME instruction;Therapeutic activities;Gait training;Therapeutic exercise;Patient/family education;Balance training;Stair training;Functional mobility training    PT Goals (Current goals can be found in the Care Plan section)  Acute Rehab PT Goals Patient Stated Goal: return home, breathe better PT Goal Formulation: With patient Time For Goal Achievement: 12/02/19 Potential to Achieve Goals: Good    Frequency Min 3X/week   Barriers to  discharge        Co-evaluation               AM-PAC PT "6 Clicks" Mobility  Outcome Measure Help needed turning from your back to your side while in a flat bed without using bedrails?: A Little Help needed moving from lying on your back to sitting on the side of a flat bed without using bedrails?: A Little Help needed moving to and from a bed to a chair (including a wheelchair)?: None Help needed standing up from a chair using your arms (e.g., wheelchair or bedside chair)?: None Help needed to walk in hospital room?: None Help needed climbing 3-5 steps with a railing? : A Little 6 Click Score: 21    End of Session   Activity Tolerance: Patient tolerated treatment well;Patient limited by fatigue Patient left: in chair;with call bell/phone within reach Nurse Communication: Mobility status;Other (comment)(sats WFL during ambulation) PT Visit Diagnosis: Other abnormalities of gait and mobility (R26.89)    Time: 7078-6754 PT Time Calculation (min) (ACUTE ONLY): 14 min   Charges:   PT Evaluation $PT Eval Low Complexity: 1 Low         Izora Benn E, PT Acute Rehabilitation Services Pager [phone]  Office [phone]   [place] Timothy D Elonda Husky 10/21/2019, 4:19 PM

## 2019-10-21 NOTE — Progress Notes (Signed)
Report received from off going RN. Iffy Oraegbunam RN no acute changes noted with assessment Maintain current plan of care

## 2019-10-21 NOTE — Progress Notes (Signed)
Lauren NOTE    Lauren Wolfe  EHU:[id number] DOB: [birth year] DOA: 10/18/2019 PCP: Biagio Borg, MD    Brief Narrative:  44 year old African-American female with PMH of migraine headaches transferred from [facility] ED for hypoxia secondary to COVID-19 pneumonia requiring 2 L of oxygen via nasal cannula.  Started on remdesivir, dexamethasone.   Assessment & Plan:   Principal Problem:   Acute respiratory failure due to COVID-19 [facility]) Active Problems:   Chronic migraine  Acute hypoxic respiratory failure secondary to COVID-19 pneumonia Required 2 L oxygen via nasal cannula.  Weaned off to room air today maintaining oxygen saturation in the mid 90s.  Inflammatory markers downtrending.  Procalcitonin negative. Continue remdesivir, dexamethasone Multivitamin Out of bed to chair, PT consult pending  Chronic migraine Stable Continue home regimen   DVT prophylaxis: Lovenox SQ Code Status: Full code  Family Communication:  Disposition Plan: Tentative plan for discharge home tomorrow pending PT recommendations.  Patient is able to get transport to complete her remdesivir infusions as an outpatient.   Consultants:   None  Procedures:  None  Antimicrobials:   Remdesivir   Subjective: Reports feeling much better.  Agreeable to work with physical therapy.  Objective: Vitals:   10/20/19 0508 10/20/19 1503 10/20/19 2050 10/21/19 0519  BP: (!) 150/80 130/87 (!) 135/95 130/73  Pulse: 79 74 73 77  Resp: 18  18 20   Temp: 98.4 F (36.9 C) 98.3 F (36.8 C) 98 F (36.7 C) 98.2 F (36.8 C)  TempSrc: Oral Oral Oral Oral  SpO2: 100% 100% 100% 99%  Weight:      Height:        Intake/Output Summary (Last 24 hours) at 10/21/2019 0738 Last data filed at 10/21/2019 0230 Gross per 24 hour  Intake 1380 ml  Output --  Net 1380 ml   Filed Weights   10/18/19 1920 10/19/19 2113  Weight: 97.5 kg 94.3 kg    Examination:  General exam: Appears calm and comfortable   Respiratory system: Clear to auscultation. Respiratory effort normal. Cardiovascular system: S1 & S2 heard, RRR. No JVD, murmurs, rubs, gallops or clicks. No pedal edema. Gastrointestinal system: Abdomen is nondistended, soft and nontender. No organomegaly or masses felt. Normal bowel sounds heard. Central nervous system: Alert and oriented. No focal neurological deficits. Extremities: Symmetric 5 x 5 power. Skin: No rashes, lesions or ulcers Psychiatry: Judgement and insight appear normal. Mood & affect appropriate.   Data Reviewed: I have personally reviewed following labs and imaging studies  CBC: Recent Labs  Lab 10/18/19 2025  WBC 5.3  NEUTROABS 2.9  HGB 14.3  HCT 44.4  MCV 89.5  PLT 785*   Basic Metabolic Panel: Recent Labs  Lab 10/18/19 2025  NA 140  K 3.6  CL 102  CO2 27  GLUCOSE 92  BUN 12  CREATININE 0.87  CALCIUM 8.9   GFR: Estimated Creatinine Clearance: 96.5 mL/min (by C-G formula based on SCr of 0.87 mg/dL). Liver Function Tests: Recent Labs  Lab 10/18/19 2025  AST 36  ALT 37  ALKPHOS 54  BILITOT 0.3  PROT 7.5  ALBUMIN 3.4*   No results for input(s): LIPASE, AMYLASE in the last 168 hours. No results for input(s): AMMONIA in the last 168 hours. Coagulation Profile: No results for input(s): INR, PROTIME in the last 168 hours. Cardiac Enzymes: No results for input(s): CKTOTAL, CKMB, CKMBINDEX, TROPONINI in the last 168 hours. BNP (last 3 results) No results for input(s): PROBNP in the last 8760 hours. HbA1C: No  results for input(s): HGBA1C in the last 72 hours. CBG: No results for input(s): GLUCAP in the last 168 hours. Lipid Profile: Recent Labs    10/18/19 2025  TRIG 162*   Thyroid Function Tests: No results for input(s): TSH, T4TOTAL, FREET4, T3FREE, THYROIDAB in the last 72 hours. Anemia Panel: Recent Labs    10/18/19 2011  FERRITIN 468*   Sepsis Labs: Recent Labs  Lab 10/18/19 2025  PROCALCITON <0.10  LATICACIDVEN 0.8     Recent Results (from the past 240 hour(s))  Blood Culture (routine x 2)     Status: None (Preliminary result)   Collection Time: 10/18/19  8:08 PM   Specimen: BLOOD  Result Value Ref Range Status   Specimen Description   Final    BLOOD RIGHT ANTECUBITAL Performed at [facility], [place] [address], [place], [place] [address]    Special Requests   Final    BOTTLES DRAWN AEROBIC AND ANAEROBIC Blood Culture adequate volume Performed at [facility], [address] [place], [place] [address]    Culture   Final    NO GROWTH 3 DAYS Performed at [facility], [place] [address] [place], [place] [address]    Report Status PENDING  Incomplete  Blood Culture (routine x 2)     Status: None (Preliminary result)   Collection Time: 10/18/19  8:25 PM   Specimen: BLOOD LEFT FOREARM  Result Value Ref Range Status   Specimen Description   Final    BLOOD LEFT FOREARM Performed at [facility], [address] [place], [place] [address]    Special Requests   Final    BOTTLES DRAWN AEROBIC AND ANAEROBIC Blood Culture adequate volume Performed at [facility], [address] [place], [place] [address]    Culture   Final    NO GROWTH 3 DAYS Performed at [facility], [place] [address], [place], [place] [address]    Report Status PENDING  Incomplete        Radiology Studies: No results found.      Scheduled Meds: . enoxaparin (LOVENOX) injection  40 mg Subcutaneous Q24H  . multivitamin with minerals  1 tablet Oral Daily  . Ensure Max Protein  11 oz Oral Daily  . triamcinolone cream  1 application Topical BID  . [START ON 10/25/2019] Vitamin D (Ergocalciferol)  50,000 Units Oral Q7 days   Continuous Infusions: . remdesivir 100 mg in NS 100 mL 100 mg (10/20/19 1036)     LOS: 3 days    Time spent:Spent more than 30 minutes in coordinating care for this patient including bedside patient care.   Lucky Cowboy, MD [facility] If 7PM-7AM, please contact night-coverage 10/21/2019, 7:38 AM

## 2019-10-21 NOTE — Plan of Care (Signed)

## 2019-10-22 LAB — D-DIMER, QUANTITATIVE: D-Dimer, Quant: 0.55 ug/mL-FEU — ABNORMAL HIGH (ref 0.00–0.50)

## 2019-10-22 LAB — C-REACTIVE PROTEIN: CRP: 1.6 mg/dL — ABNORMAL HIGH (ref <1.0)

## 2019-10-22 LAB — FERRITIN: Ferritin: 348 ng/mL — ABNORMAL HIGH (ref 11–307)

## 2019-10-22 MED ORDER — ZINC SULFATE 220 (50 ZN) MG PO CAPS: 220.0000 mg | ORAL_CAPSULE | Freq: Every day | ORAL | 0 refills | Status: DC

## 2019-10-22 MED ORDER — ASCORBIC ACID 500 MG PO TABS: 500.0000 mg | ORAL_TABLET | Freq: Every day | ORAL | 0 refills | Status: DC

## 2019-10-22 NOTE — Progress Notes (Signed)
Patient scheduled for outpatient Remdesivir infusion at 1:00 pm on Thursday, 1/21.   Please advise them to report to [facility] at [address]  Drive to the security guard and tell them you are here for an infusion. They will direct you to the front entrance where we will come and get you.  For questions call [phone].  Thanks

## 2019-10-22 NOTE — Discharge Instructions (Signed)
You are scheduled for an outpatient infusion of Remdesivir at 1:00 pm on Thursday, 1/21.Marland Kitchen  Please report to [facility] at [address].  Drive to the security guard and tell them you are here for an infusion. They will direct you to the front entrance where we will come and get you.  For questions call [phone].  Thanks

## 2019-10-22 NOTE — Discharge Summary (Signed)
Physician Discharge Summary  CAMYRA VAETH DGL:[id number] DOB: 02-14-1976 DOA: 10/18/2019  PCP: Biagio Borg, MD  Admit date: 10/18/2019 Discharge date: 10/22/2019  Admitted From: Home Disposition:  home  Recommendations for Outpatient Follow-up:  1. Last dose of remdesivir remaining.  Arranged via [facility] infusion center to be received on 10/23/2019.  Details provided to patient.  Home Health: No Equipment/Devices: None  Discharge Condition: Stable CODE STATUS: Full code Diet recommendation: Regular diet  Brief/Interim Summary: 44 year old African-American female with PMH of migraine headaches transferred from [place] ED for hypoxia secondary to COVID-19 pneumonia requiring 2 L of oxygen via nasal cannula.  CTA chest negative for acute PE but found to have multifocal areas of mixed groundglass opacities and consolidative airspace opacities reflecting atelectatic changes.  She was started on remdesivir subsequently weaned off oxygen and saturated 96% on room air.  Worked with PT who recommended no acute needs and no home health needs.  She remained hemodynamically stable throughout the hospital stay.  She lives with her daughter who is 90 years old who will be helping her at home.  Discharge Diagnoses:  Principal Problem:   Acute respiratory failure due to COVID-19 [facility]) Active Problems:   Chronic migraine    Discharge Instructions:  Discharge Instructions    Diet - low sodium heart healthy   Complete by: As directed    Increase activity slowly   Complete by: As directed      Allergies as of 10/22/2019      Reactions   Azithromycin Shortness Of Breath   REACTION: rash, prurutis   Codeine Shortness Of Breath   Causes shortness of breath, rash, itching   Hydrocodone Shortness Of Breath   Rash, itching   Ketorolac Tromethamine Shortness Of Breath   Itching, rash   Prednisone    SOB   Tramadol    itching      Medication List    TAKE these medications    Ajovy 225 MG/1.5ML Sosy Generic drug: Fremanezumab-vfrm Inject 225 mg into the skin every 30 (thirty) days.   albuterol 108 (90 Base) MCG/ACT inhaler Commonly known as: VENTOLIN HFA Inhale 2 puffs into the lungs every 6 (six) hours as needed for wheezing or shortness of breath.   ascorbic acid 500 MG tablet Commonly known as: VITAMIN C Take 1 tablet (500 mg total) by mouth daily. Start taking on: October 23, 2019   benzonatate 100 MG capsule Commonly known as: Best boy Take 1 capsule (100 mg total) by mouth 2 (two) times daily as needed for cough.   ibuprofen 600 MG tablet Commonly known as: ADVIL TAKE 1 TABLET (600 MG TOTAL) BY MOUTH EVERY 8 (EIGHT) HOURS. What changed:   when to take this  reasons to take this   levonorgestrel 20 MCG/24HR IUD Commonly known as: MIRENA 1 each by Intrauterine route once.   methocarbamol 750 MG tablet Commonly known as: ROBAXIN Take 1 tablet (750 mg total) by mouth 3 (three) times daily as needed (muscle spasm/pain).   ondansetron 4 MG tablet Commonly known as: Zofran Take 1 tablet (4 mg total) by mouth every 8 (eight) hours as needed for nausea or vomiting.   ondansetron 8 MG disintegrating tablet Commonly known as: ZOFRAN-ODT Take 1 tablet (8 mg total) by mouth every 8 (eight) hours as needed for nausea or vomiting.   Pennsaid 2 % Soln Generic drug: Diclofenac Sodium Place 2 g onto the skin 2 (two) times daily. What changed:   when to  take this  reasons to take this   SUMAtriptan 6 MG/0.5ML Soln injection Commonly known as: Imitrex One injection at onset of migraine.  May repeat in 2 hrs, if needed.  Max dose: 2 inj/day. This is a 30 day prescription.   tiZANidine 4 MG tablet Commonly known as: ZANAFLEX TAKE ONE TABLET BY MOUTH THREE TIMES A DAY AS NEEDED FOR MIGRAINES FOR 30 DAYS What changed:   how much to take  how to take this  when to take this  reasons to take this  additional instructions    traMADol 50 MG tablet Commonly known as: ULTRAM Take 1 tablet (50 mg total) by mouth every 6 (six) hours as needed. What changed: reasons to take this   triamcinolone cream 0.1 % Commonly known as: KENALOG APPLY TO AFFECTED AREA TWICE A DAY What changed: See the new instructions.   Vitamin D (Ergocalciferol) 1.25 MG (50000 UNIT) Caps capsule Commonly known as: DRISDOL TAKE 1 CAPSULE (50,000 UNITS TOTAL) BY MOUTH EVERY 7 (SEVEN) DAYS.   zinc sulfate 220 (50 Zn) MG capsule Take 1 capsule (220 mg total) by mouth daily. Start taking on: October 23, 2019       Allergies  Allergen Reactions  . Azithromycin Shortness Of Breath    REACTION: rash, prurutis  . Codeine Shortness Of Breath    Causes shortness of breath, rash, itching  . Hydrocodone Shortness Of Breath    Rash, itching  . Ketorolac Tromethamine Shortness Of Breath    Itching, rash  . Prednisone     SOB  . Tramadol     itching    Consultations:  None   Procedures/Studies: CT Angio Chest PE W and/or Wo Contrast  Result Date: 10/18/2019 CLINICAL DATA:  Shortness of breath, COVID-19 positive diagnosed 10/09/2018 EXAM: CT ANGIOGRAPHY CHEST WITH CONTRAST TECHNIQUE: Multidetector CT imaging of the chest was performed using the standard protocol during bolus administration of intravenous contrast. Multiplanar CT image reconstructions and MIPs were obtained to evaluate the vascular anatomy. CONTRAST:  179m OMNIPAQUE IOHEXOL 350 MG/ML SOLN COMPARISON:  Radiograph 10/18/2019, CT 05/16/2019 FINDINGS: Cardiovascular: Satisfactory opacification the pulmonary arteries to the segmental level. No pulmonary artery filling defects are identified. Central pulmonary arteries are normal caliber. Normal heart size. No pericardial effusion. The aorta is normal caliber. No acute luminal abnormality or periaortic abnormality is seen. Shared origin of the brachiocephalic and left common carotid arteries. Proximal great vessels are  unremarkable. Mediastinum/Nodes: Borderline enlarged low-attenuation mediastinal and hilar nodes are present, for example a 12 mm AP window lymph node (4/26) and a 12 mm right hilar node (4/33). Thyroid gland and thoracic inlet are unremarkable. No acute abnormality of the trachea. Small hiatal hernia. Lungs/Pleura: Multifocal areas mixed ground-glass and consolidative airspace opacity are present throughout both lungs with interspersed areas of bandlike opacity likely reflecting some atelectatic change. No pneumothorax or visible pleural effusion. Upper Abdomen: Heterogeneous enhancement the spleen is normal for arterial phase. No acute abnormalities present in the visualized portions of the upper abdomen. Musculoskeletal: No acute osseous abnormality or suspicious osseous lesion. No suspicious chest wall lesions. Review of the MIP images confirms the above findings. IMPRESSION: 1. No evidence of acute pulmonary artery embolism. 2. Multifocal areas of mixed ground-glass and consolidative airspace opacity throughout both lungs with interspersed areas of bandlike opacity likely reflecting some atelectatic change. Findings are nonspecific but can be seen with an atypical infection. 3. Borderline enlarged low-attenuation mediastinal and hilar nodes are likely reactive. Electronically Signed   By:  Lovena Le M.D.   On: 10/18/2019 22:39   DG Chest Portable 1 View  Result Date: 10/18/2019 CLINICAL DATA:  Shortness of breath, COVID-19 positive EXAM: PORTABLE CHEST 1 VIEW COMPARISON:  04/20/2019 FINDINGS: The heart size and mediastinal contours are within normal limits. Lung volumes are low. There are streaky perihilar and bibasilar opacities. No pneumothorax. The visualized skeletal structures are unremarkable. IMPRESSION: Low lung volumes with streaky perihilar and bibasilar opacities, which may reflect atelectasis and/or pneumonia. Electronically Signed   By: Davina Poke D.O.   On: 10/18/2019 20:00   US  BREAST LTD UNI LEFT INC AXILLA  Result Date: 09/29/2019 CLINICAL DATA:  Follow-up 2 probably benign clustered cysts in the left breast. EXAM: ULTRASOUND OF THE LEFT BREAST COMPARISON:  03/25/2019 FINDINGS: Targeted ultrasound is performed, showing a 5 x 3 x 2 mm oval, horizontally oriented, hypoechoic area with low-level internal echoes in the 7 o'clock position of the left breast, 3 cm from the nipple. This previously measured 5 x 3 x 2 mm. There is a 7 x 6 x 3 mm oval, horizontally oriented hypoechoic area with minimal internal echoes in the 9 o'clock position of the left breast, 3 cm from the nipple. This previously measured 8 x 6 x 2 mm and head more internal echoes previously. IMPRESSION: Stable small probable complicated cyst in the 7 o'clock position of the left breast and improving appearance of a minimally complicated cyst in the 9 o'clock position of the left breast. RECOMMENDATION: Bilateral diagnostic mammogram and left breast ultrasound 6 months. I have discussed the findings and recommendations with the patient. If applicable, a reminder letter will be sent to the patient regarding the next appointment. BI-RADS CATEGORY  3: Probably benign. Electronically Signed   By: Claudie Revering M.D.   On: 09/29/2019 12:12       Subjective: Reports doing much better.  Wants to go home.  Discharge Exam: Vitals:   10/21/19 2102 10/22/19 0409  BP: (!) 142/100 128/80  Pulse: 89 79  Resp: 20 20  Temp: 98 F (36.7 C) 98.4 F (36.9 C)  SpO2: 95% 96%   Vitals:   10/21/19 1321 10/21/19 1752 10/21/19 2102 10/22/19 0409  BP: (!) 145/100 (!) 142/94 (!) 142/100 128/80  Pulse: 71 70 89 79  Resp: (!) 22 20 20 20   Temp: 98.3 F (36.8 C)  98 F (36.7 C) 98.4 F (36.9 C)  TempSrc: Oral  Oral Oral  SpO2: 95% 95% 95% 96%  Weight:      Height:        General exam: Appears calm and comfortable  Respiratory system: Clear to auscultation. Respiratory effort normal. Cardiovascular system: S1 & S2  heard, RRR. No JVD, murmurs, rubs. No pedal edema. Gastrointestinal system: Abdomen is nondistended, soft and nontender. No organomegaly or masses felt. Normal bowel sounds heard. Central nervous system: Alert and oriented. No focal neurological deficits. Extremities: Symmetric 5 x 5 power. Skin: No rashes, lesions or ulcers Psychiatry: Judgement and insight appear normal. Mood & affect appropriate.    The results of significant diagnostics from this hospitalization (including imaging, microbiology, ancillary and laboratory) are listed below for reference.     Microbiology: Recent Results (from the past 240 hour(s))  Blood Culture (routine x 2)     Status: None (Preliminary result)   Collection Time: 10/18/19  8:08 PM   Specimen: BLOOD  Result Value Ref Range Status   Specimen Description   Final    BLOOD RIGHT ANTECUBITAL Performed  at [facility], [place] [address], [place], [place] [address]    Special Requests   Final    BOTTLES DRAWN AEROBIC AND ANAEROBIC Blood Culture adequate volume Performed at [facility], [address] [place], [place] [address]    Culture   Final    NO GROWTH 4 DAYS Performed at [facility], [place] [address], [place], [place] [address]    Report Status PENDING  Incomplete  Blood Culture (routine x 2)     Status: None (Preliminary result)   Collection Time: 10/18/19  8:25 PM   Specimen: BLOOD LEFT FOREARM  Result Value Ref Range Status   Specimen Description   Final    BLOOD LEFT FOREARM Performed at [facility], [address] [place], [place] [address]    Special Requests   Final    BOTTLES DRAWN AEROBIC AND ANAEROBIC Blood Culture adequate volume Performed at [facility], [address] [place], [place] [address]    Culture   Final    NO GROWTH 4 DAYS Performed at [facility], [place] [address] [place],  [address]    Report Status PENDING  Incomplete     Labs: BNP (last 3  results) No results for input(s): BNP in the last 8760 hours. Basic Metabolic Panel: Recent Labs  Lab 10/18/19 2025  NA 140  K 3.6  CL 102  CO2 27  GLUCOSE 92  BUN 12  CREATININE 0.87  CALCIUM 8.9   Liver Function Tests: Recent Labs  Lab 10/18/19 2025  AST 36  ALT 37  ALKPHOS 54  BILITOT 0.3  PROT 7.5  ALBUMIN 3.4*   No results for input(s): LIPASE, AMYLASE in the last 168 hours. No results for input(s): AMMONIA in the last 168 hours. CBC: Recent Labs  Lab 10/18/19 2025  WBC 5.3  NEUTROABS 2.9  HGB 14.3  HCT 44.4  MCV 89.5  PLT 415*   Cardiac Enzymes: No results for input(s): CKTOTAL, CKMB, CKMBINDEX, TROPONINI in the last 168 hours. BNP: Invalid input(s): POCBNP CBG: No results for input(s): GLUCAP in the last 168 hours. D-Dimer Recent Labs    10/21/19 0808 10/22/19 0222  DDIMER 0.73* 0.55*   Hgb A1c No results for input(s): HGBA1C in the last 72 hours. Lipid Profile No results for input(s): CHOL, HDL, LDLCALC, TRIG, CHOLHDL, LDLDIRECT in the last 72 hours. Thyroid function studies No results for input(s): TSH, T4TOTAL, T3FREE, THYROIDAB in the last 72 hours.  Invalid input(s): FREET3 Anemia work up Recent Labs    10/21/19 0808 10/22/19 0222  FERRITIN 416* 348*   Urinalysis    Component Value Date/Time   COLORURINE YELLOW 10/21/2018 [place] 10/21/2018 1656   LABSPEC 1.010 10/21/2018 1656   PHURINE 7.5 10/21/2018 1656   GLUCOSEU NEGATIVE 10/21/2018 1656   HGBUR NEGATIVE 10/21/2018 1656   BILIRUBINUR NEGATIVE 10/21/2018 1656   KETONESUR NEGATIVE 10/21/2018 1656   UROBILINOGEN 0.2 10/21/2018 1656   NITRITE NEGATIVE 10/21/2018 1656   LEUKOCYTESUR NEGATIVE 10/21/2018 1656   Sepsis Labs Invalid input(s): PROCALCITONIN,  WBC,  LACTICIDVEN Microbiology Recent Results (from the past 240 hour(s))  Blood Culture (routine x 2)     Status: None (Preliminary result)   Collection Time: 10/18/19  8:08 PM   Specimen: BLOOD   Result Value Ref Range Status   Specimen Description   Final    BLOOD RIGHT ANTECUBITAL Performed at [facility],  [address] [place], [place] [address]    Special Requests   Final    BOTTLES DRAWN AEROBIC AND ANAEROBIC Blood Culture adequate volume Performed at [facility], [address] [place], [place] [address]    Culture   Final    NO GROWTH 4 DAYS Performed at [facility], [place] [address], [place], [place] [address]    Report Status PENDING  Incomplete  Blood Culture (routine x 2)     Status: None (Preliminary result)   Collection Time: 10/18/19  8:25 PM   Specimen: BLOOD LEFT FOREARM  Result Value Ref Range Status   Specimen Description   Final    BLOOD LEFT FOREARM Performed at [facility], [place] [address] [place], [place] [address]    Special Requests   Final    BOTTLES DRAWN AEROBIC AND ANAEROBIC Blood Culture adequate volume Performed at [facility], [address] [place], [place] [address]    Culture   Final    NO GROWTH 4 DAYS Performed at [facility], [place] [address] [place], [place] [address]    Report Status PENDING  Incomplete     Time coordinating discharge: Over 30 minutes  SIGNED:   Lucky Cowboy, MD  [facility] 10/22/2019, 12:03 PM  If 7PM-7AM, please contact night-coverage

## 2019-10-22 NOTE — Plan of Care (Signed)
  Problem: Education: Goal: Knowledge of General Education information will improve Description: Including pain rating scale, medication(s)/side effects and non-pharmacologic comfort measures Outcome: Completed/Met   Problem: Health Behavior/Discharge Planning: Goal: Ability to manage health-related needs will improve Outcome: Completed/Met   Problem: Clinical Measurements: Goal: Ability to maintain clinical measurements within normal limits will improve Outcome: Completed/Met Goal: Will remain free from infection Outcome: Completed/Met Goal: Diagnostic test results will improve Outcome: Completed/Met Goal: Respiratory complications will improve 10/22/2019 1301 by Margarette Canada, RN Outcome: Completed/Met 10/22/2019 0931 by Margarette Canada, RN Outcome: Progressing Goal: Cardiovascular complication will be avoided 10/22/2019 1301 by Margarette Canada, RN Outcome: Completed/Met 10/22/2019 0931 by Margarette Canada, RN Outcome: Progressing   Problem: Activity: Goal: Risk for activity intolerance will decrease Outcome: Completed/Met   Problem: Nutrition: Goal: Adequate nutrition will be maintained Outcome: Completed/Met   Problem: Coping: Goal: Level of anxiety will decrease Outcome: Completed/Met   Problem: Elimination: Goal: Will not experience complications related to bowel motility Outcome: Completed/Met Goal: Will not experience complications related to urinary retention Outcome: Completed/Met   Problem: Pain Managment: Goal: General experience of comfort will improve Outcome: Completed/Met   Problem: Safety: Goal: Ability to remain free from injury will improve Outcome: Completed/Met   Problem: Skin Integrity: Goal: Risk for impaired skin integrity will decrease Outcome: Completed/Met

## 2019-10-22 NOTE — TOC Progression Note (Signed)
Transition of Care [facility]) - Progression Note    Patient Details  Name: Lauren Wolfe MRN: [id number] Date of Birth: 12/31/1975  Transition of Care [facility]) CM/SW Contact  Purcell Mouton, RN Phone Number: 10/22/2019, 1:30 PM  Clinical Narrative:    Pt discharging home with no needs.          Expected Discharge Plan and Services           Expected Discharge Date: 10/22/19                                     Social Determinants of Health (SDOH) Interventions    Readmission Risk Interventions No flowsheet data found.

## 2019-10-22 NOTE — Plan of Care (Signed)
  Problem: Clinical Measurements: Goal: Respiratory complications will improve Outcome: Progressing Goal: Cardiovascular complication will be avoided Outcome: Progressing   Problem: Education: Goal: Knowledge of General Education information will improve Description: Including pain rating scale, medication(s)/side effects and non-pharmacologic comfort measures Outcome: Completed/Met   Problem: Activity: Goal: Risk for activity intolerance will decrease Outcome: Completed/Met   Problem: Nutrition: Goal: Adequate nutrition will be maintained Outcome: Completed/Met   Problem: Coping: Goal: Level of anxiety will decrease Outcome: Completed/Met   Problem: Pain Managment: Goal: General experience of comfort will improve Outcome: Completed/Met

## 2019-10-23 ENCOUNTER — Telehealth: Payer: Self-pay | Admitting: *Deleted

## 2019-10-23 ENCOUNTER — Ambulatory Visit (HOSPITAL_COMMUNITY)
Admission: RE | Admit: 2019-10-23 | Discharge: 2019-10-23 | Disposition: A | Discharge status: Home / Self Care | Payer: 59 | Source: Ambulatory Visit | Attending: Pulmonary Disease | Admitting: Pulmonary Disease

## 2019-10-23 ENCOUNTER — Encounter: Payer: 59 | Admitting: Internal Medicine

## 2019-10-23 VITALS — BP 128/89 | HR 82 | Temp 97.8°F | Resp 18

## 2019-10-23 DIAGNOSIS — J1282 Pneumonia due to coronavirus disease 2019: Secondary | ICD-10-CM | POA: Diagnosis present

## 2019-10-23 DIAGNOSIS — U071 COVID-19: Secondary | ICD-10-CM | POA: Insufficient documentation

## 2019-10-23 LAB — CULTURE, BLOOD (ROUTINE X 2)
Culture: NO GROWTH
Culture: NO GROWTH
Special Requests: ADEQUATE
Special Requests: ADEQUATE

## 2019-10-23 MED ORDER — SODIUM CHLORIDE 0.9 % IV SOLN
INTRAVENOUS | Status: DC | PRN
  Administered 2019-10-23: 250 mL via INTRAVENOUS

## 2019-10-23 MED ORDER — FAMOTIDINE IN NACL 20-0.9 MG/50ML-% IV SOLN: 20.0000 mg | Freq: Once | INTRAVENOUS | Status: DC | PRN

## 2019-10-23 MED ORDER — SODIUM CHLORIDE 0.9 % IV SOLN: 100.0000 mg | Freq: Once | INTRAVENOUS | Status: AC

## 2019-10-23 MED ORDER — ALBUTEROL SULFATE HFA 108 (90 BASE) MCG/ACT IN AERS: 2.0000 | INHALATION_SPRAY | Freq: Once | RESPIRATORY_TRACT | Status: DC | PRN

## 2019-10-23 MED ORDER — METHYLPREDNISOLONE SODIUM SUCC 125 MG IJ SOLR: 125.0000 mg | Freq: Once | INTRAMUSCULAR | Status: DC | PRN

## 2019-10-23 MED ORDER — EPINEPHRINE 0.3 MG/0.3ML IJ SOAJ: 0.3000 mg | Freq: Once | INTRAMUSCULAR | Status: DC | PRN

## 2019-10-23 MED ORDER — DIPHENHYDRAMINE HCL 50 MG/ML IJ SOLN: 50.0000 mg | Freq: Once | INTRAMUSCULAR | Status: DC | PRN

## 2019-10-23 MED ORDER — SODIUM CHLORIDE 0.9 % IV SOLN
INTRAVENOUS | Status: AC
  Administered 2019-10-23: 13:00:00 100 mg via INTRAVENOUS
  Filled 2019-10-23: qty 20

## 2019-10-23 NOTE — Progress Notes (Signed)
  Diagnosis: COVID-19  Physician:  Dr. Joya Gaskins Procedure: [facility] Infusion [facility] Med: remdesivir infusion.  Complications: No immediate complications noted.  Discharge: Discharged home   Lauren Wolfe 10/23/2019

## 2019-10-23 NOTE — Telephone Encounter (Signed)
Called pt to f/u from discharge on 10/22/19. Pt states she made appt for hosp f/u appt  11/05/19. Inform pt since she was dx w/Covid I will need to change to an virtual which she stated that would be fine. Also inform had some additional questions concerning discharge. Completed TCM below.Lauren Wolfe  Transition Care Management Follow-up Telephone Call   Date discharged? 10/22/19   How have you been since you were released from the hospital? Pt states she is doing fine   Do you understand why you were in the hospital?YES   Do you understand the discharge instructions? YES   Where were you discharged to? Home   Items Reviewed:  Medications reviewed: YES. Pt states they also added zinc and vitamin c  Allergies reviewed: YES  Dietary changes reviewed: YES  Referrals reviewed: No referral recommeded   Functional Questionnaire:   Activities of Daily Living (ADLs):   She states she are independent in the following: ambulation, bathing and hygiene, feeding, continence, grooming, toileting and dressing States she doesn't require assistance    Any transportation issues/concerns?: NO   Any patient concerns? NO   Confirmed importance and date/time of follow-up visits scheduled YES, (virtual) 11/05/19  Provider Appointment booked with Dr. Jenny Reichmann  Confirmed with patient if condition begins to worsen call PCP or go to the ER.  Patient was given the office number and encouraged to call back with question or concerns.  : YES

## 2019-10-24 ENCOUNTER — Encounter: Payer: Self-pay | Admitting: Internal Medicine

## 2019-10-24 ENCOUNTER — Telehealth: Payer: Self-pay | Admitting: *Deleted

## 2019-10-24 NOTE — Telephone Encounter (Signed)
Pt called stating she tested positive for covid on 10/09/2019. She has an appt scheduled to see you on Monday 1/25 and she just wanted to double check to see if you were okay with her coming or does she need to rsch?

## 2019-10-24 NOTE — Telephone Encounter (Signed)
Can we reschedule for 3 weeks out, it is close but if we could do after the 28th that would be based on guidelines

## 2019-10-26 ENCOUNTER — Other Ambulatory Visit: Payer: Self-pay | Admitting: Internal Medicine

## 2019-10-27 ENCOUNTER — Ambulatory Visit: Payer: 59 | Admitting: Family Medicine

## 2019-10-27 ENCOUNTER — Ambulatory Visit (INDEPENDENT_AMBULATORY_CARE_PROVIDER_SITE_OTHER): Payer: 59 | Admitting: Internal Medicine

## 2019-10-27 ENCOUNTER — Other Ambulatory Visit: Payer: Self-pay

## 2019-10-27 ENCOUNTER — Encounter: Payer: Self-pay | Admitting: Internal Medicine

## 2019-10-27 DIAGNOSIS — J1282 Pneumonia due to coronavirus disease 2019: Secondary | ICD-10-CM

## 2019-10-27 DIAGNOSIS — R0602 Shortness of breath: Secondary | ICD-10-CM | POA: Insufficient documentation

## 2019-10-27 DIAGNOSIS — U071 COVID-19: Secondary | ICD-10-CM

## 2019-10-27 NOTE — Assessment & Plan Note (Signed)
She is experiencing shortness of breath secondary to pneumonia secondary to Covid She was hospitalized and did complete treatment She feels short of breath when she walks-she gets winded easily She is eating the inhaler, but is unsure if that helped. She has an appointment with pulmonary in 2 days and then may need to be a virtual visit.  She was unsure if she should be seen at the respiratory clinic Reviewed her imaging with her and discussed that she is still recovering from pneumonia secondary to Covid Her shortness of breath is to be expected, but should slowly improve, but it may take another 3 weeks or so Discussed seeing pulmonary, possibly get an appointment at the respiratory clinic at this time she will see pulmonary in 2 days either virtually or in person and then determine if further evaluation is needed She will call back if she has any questions or concerns Continue inhaler, flutter valve

## 2019-10-27 NOTE — Telephone Encounter (Signed)
Appointment has been rescheduled.

## 2019-10-27 NOTE — Progress Notes (Signed)
Virtual Visit via Video Note  I connected with Lauren Wolfe on 10/27/19 at 10:45 AM EST by a video enabled telemedicine application and verified that I am speaking with the correct person using two identifiers.   I discussed the limitations of evaluation and management by telemedicine and the availability of in person appointments. The patient expressed understanding and agreed to proceed.  Present for the visit:  Myself, Dr Billey Gosling, Lauren Wolfe.  The patient is currently at home and I am in the office.    No referring provider.    History of Present Illness: This visit is an acute visit for shortness of breath related to Covid infection and possibly getting an appointment with respiratory clinic.  She was having increased headaches.  Her daughter tested positive for Covid and she decided to get tested, which was on 1/6.  2 days later, 1/8, her test came back positive.  At that point she developed a cough, fever and shortness of breath.   1/6 test, 1/8 po,   Headaches only.  Then cough, fever, sob.  Her shortness of breath got worse and she did end up going to the emergency room in [facility].  Her oxygenation was low and she did require oxygen via nasal cannula.  She was transferred to [facility].  She was discharged last Thursday, 1/20.  She received her last dose of remdesivir as an outpatient on 1/21.  She does feel better, but is concerned about her persistent shortness of breath.  She can walk around her house, but depending on the activity she needs to rest to catch her breath.  Coming up the stairs is extremely difficult for her takes a lot out of her.  She is using the inhaler.  She is unsure if this really helps or not.  She is also using the flutter valve given to her by the hospital.  She does have some cough, especially when she gets out of breath.  She has not noticed a lot of sputum production, wheezing, fevers or chest pain.  Her headaches are better.  Overall she  feels better, is just the shortness of breath that is concerning.  She gets very winded when she walks.  When it is cold she also finds it harder to breathe and catch her breath.  She has an appointment with her pulmonary doctor in 2 days for follow-up of an abnormal CT scan.  She was told that that may need to be virtual or rescheduled because of her recent Covid diagnosis.  Review of Systems  Constitutional: Negative for fever.  Respiratory: Positive for cough and shortness of breath. Negative for sputum production and wheezing.   Cardiovascular: Negative for chest pain.  Neurological: Positive for headaches (better).      Social History   Socioeconomic History  . Marital status: Single    Spouse name: Not on file  . Number of children: 1  . Years of education: some college  . Highest education level: Not on file  Occupational History  . 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 Smoking status: Never Smoker  . Smokeless tobacco: Never Used  Substance and Sexual Activity  . Alcohol use: No  . Drug use: No  . Sexual activity: Yes  Other Topics Concern  . Not on file  Social History Narrative   Lives at home with her daughter.   Right-handed.   No caffeine use.   Social Determinants of Health  Financial Resource Strain:   . Difficulty of Paying Living Expenses: Not on file  Food Insecurity:   . Worried About Charity fundraiser in the Last Year: Not on file  . Ran Out of Food in the Last Year: Not on file  Transportation Needs:   . Lack of Transportation (Medical): Not on file  . Lack of Transportation (Non-Medical): Not on file  Physical Activity:   . Days of Exercise per Week: Not on file  . Minutes of Exercise per Session: Not on file  Stress:   . Feeling of Stress : Not on file  Social Connections:   . Frequency of Communication with Friends and Family: Not on file  . Frequency of Social Gatherings with Friends and Family: Not on file  . Attends  Religious Services: Not on file  . Active Member of Clubs or Organizations: Not on file  . Attends Archivist Meetings: Not on file  . Marital Status: Not on file     Observations/Objective: Appears well in NAD Breathing normally and able to speak in full sentences.  Intermittent dry cough Skin appears warm and dry  Assessment and Plan:  See Problem List for Assessment and Plan of chronic medical problems.   Follow Up Instructions:    I discussed the assessment and treatment plan with the patient. The patient was provided an opportunity to ask questions and all were answered. The patient agreed with the plan and demonstrated an understanding of the instructions.   The patient was advised to call back or seek an in-person evaluation if the symptoms worsen or if the condition fails to improve as anticipated.    Binnie Rail, MD

## 2019-10-28 ENCOUNTER — Telehealth: Payer: Self-pay | Admitting: Internal Medicine

## 2019-10-28 NOTE — Telephone Encounter (Signed)
Pt had to come back to see Dr. Quay Burow on yesterday. Still having problems w/her breathing. She states she is schedule to see her pulmonologist tomorrow 10/28/10.Marland KitchenJohny Chess

## 2019-10-28 NOTE — Telephone Encounter (Signed)
    Patient calling to discuss returning to work and completion of [facility] forms (see MyChart message)  Patient had virtual visit on 1/25  Please call

## 2019-10-28 NOTE — Telephone Encounter (Signed)
Called pt to verify her 1st day out of work which she's been out since 10/06/19. Pt is schedule to see you on 2/3 for hosp f/u as well. Called John w/[employer] updated him of status on the case. Will fax forms and ov notes once form is complete.Marland KitchenJohny Chess

## 2019-10-28 NOTE — Telephone Encounter (Signed)
Called pt to clarify msg below. Pt states she saw MD virtually for hosp f/u due having covid. She has been out of work since 10/06/19. Her daughter had covid first, and then she became postive. The insurance guy Jenny Reichmann) w/[employer] is clling stating that he has not received any information about her being out of work. Pt states she sent a mychart msg 10/24/19 attached forms and have not heard back. Inform pt will f/u w/Stephanie and contact John w/information. Printed [employer] forms attach for MD to sign. Place on MD desk...Johny Chess

## 2019-10-29 ENCOUNTER — Encounter: Payer: Self-pay | Admitting: Emergency Medicine

## 2019-10-29 ENCOUNTER — Telehealth (INDEPENDENT_AMBULATORY_CARE_PROVIDER_SITE_OTHER): Payer: 59 | Admitting: Emergency Medicine

## 2019-10-29 ENCOUNTER — Ambulatory Visit (INDEPENDENT_AMBULATORY_CARE_PROVIDER_SITE_OTHER): Payer: 59 | Admitting: Medical

## 2019-10-29 ENCOUNTER — Other Ambulatory Visit: Payer: Self-pay

## 2019-10-29 VITALS — BP 140/80 | HR 89 | Temp 98.5°F | Ht 66.0 in | Wt 214.0 lb

## 2019-10-29 DIAGNOSIS — R0602 Shortness of breath: Secondary | ICD-10-CM

## 2019-10-29 DIAGNOSIS — R059 Cough, unspecified: Secondary | ICD-10-CM | POA: Insufficient documentation

## 2019-10-29 DIAGNOSIS — R05 Cough: Secondary | ICD-10-CM | POA: Diagnosis not present

## 2019-10-29 DIAGNOSIS — R9389 Abnormal findings on diagnostic imaging of other specified body structures: Secondary | ICD-10-CM

## 2019-10-29 DIAGNOSIS — U071 COVID-19: Secondary | ICD-10-CM

## 2019-10-29 MED ORDER — HYDROCODONE-HOMATROPINE 5-1.5 MG/5ML PO SYRP: 5.0000 mL | ORAL_SOLUTION | Freq: Four times a day (QID) | ORAL | 0 refills | Status: DC | PRN

## 2019-10-29 NOTE — Progress Notes (Signed)
[facility]   Subjective:  Lauren Wolfe is a 44 y.o. female who presents for respiratory illness.    PCP:  Lauren Borg, MD  Started feeling headaches with migraines 10/06/2019.   She continued to have headache and got covid test due to daughter 10yo begin + for covid in same house.   Lauren Wolfe tested + for covid 10/10/2019.  By the day of the test started getting SOB, fever, loss of taste, nausea.  No loose stool, no wheezing.     In last few days, still having hard time breathing, still some nausea.  Not wheezy.  No fever.  Cough some, not a whole lot.  Drinking a lot of water, urine clear.  Overall energy is ok, not too bad.   Was using albuterol inhaler, used yuesterday but was having hard time sleeping, felt like the albuterol was keeping her wired or awake.    Had e-visit today with pulmonology, they advised she come here for eval, chest xray.   pulm prescribed Hycodan today that she has not picked up yet.  They did discussed her allergies and still wanted her to try this.   She is not a smoker.  Has hx/o DVT, but no hx/o PE.  DVT was 3 years ago.  She was hospitalized January 16 through October 22, 2019 for acute respiratory failure due to COVID-19, and she did receive a full course of remdesivir.  No other aggravating or relieving factors.  No other c/o.  Past Medical History:  Diagnosis Date  . Anxiety   . Arm vein blood clot, right   . Knee pain, left 01/2019  . Migraine   . Wears glasses    and contacts    ROS as in subjective   Objective: BP 140/80   Pulse 89   Temp 98.5 F (36.9 C)   Ht 5' 6"  (1.676 m)   Wt 214 lb (97.1 kg)   SpO2 95%   BMI 34.54 kg/m   Wt Readings from Last 3 Encounters:  10/29/19 214 lb (97.1 kg)  10/19/19 207 lb 14.4 oz (94.3 kg)  09/17/19 221 lb (100.2 kg)   BP Readings from Last 3 Encounters:  10/29/19 140/80  10/23/19 128/89  10/22/19 128/80    General appearance: Alert, WD/WN, no distress, not  ill-appearing                             Skin: warm, no rash                           Head: no sinus tenderness                            Eyes: conjunctiva normal, corneas clear, PERRLA                            Nose: septum midline, turbinates swollen, with mild erythema and clear discharge             Mouth/throat: MMM, tongue normal, no pharyngeal erythema                           Neck: supple, no adenopathy, no thyromegaly, non tender  Heart: RRR, normal S1, S2, no murmurs                         Lungs: Relatively clear but mildly decreased lower fields sounds, no wheezes, rales, or rhonchi       Assessment  Encounter Diagnoses  Name Primary?  . Cough Yes  . SOB (shortness of breath)   . COVID-19   . Abnormal chest CT       Plan: We discussed her recent symptoms, recent hospitalization, overall her vital signs are stable and she looks improved compared to her recent hospitalization.  She will go tomorrow morning for chest x-ray at [facility].  She will continue to rest, hydrate.  She does get wired up with albuterol but will use as needed, she will begin the new Hycodan syrup called out by her pulmonologist today.  She has a listed allergy of hydrocodone but she will use Benadryl and stop the medicine Hycodan if she starts having problems with Hycodan.  We discussed that she will gradually improve over the next few weeks.expect to feel great within days.  Activity as tolerated.  She was advised to go to hospital if much worse.  She is advised to call her PCP within the next few days to update them on her symptoms.  We will call with chest x-ray results.  Patient voiced understanding of diagnosis, recommendations, and treatment plan.  After visit summary given.   Lauren Wolfe was seen today for sob upon exertion / cough.  Diagnoses and all orders for this visit:  Cough -     DG Chest 2 View; Future  SOB (shortness of breath) -     DG  Chest 2 View; Future  COVID-19 -     DG Chest 2 View; Future  Abnormal chest CT

## 2019-10-29 NOTE — Progress Notes (Signed)
Virtual Visit via Video Note  I connected with Lauren Wolfe on 10/29/19 at  1:30 PM EST by a video enabled telemedicine application and verified that I am speaking with the correct person using two identifiers.  Location: Patient: Home Provider: Office   I discussed the limitations of evaluation and management by telemedicine and the availability of in person appointments. The patient expressed understanding and agreed to proceed.  History of Present Illness: 44 year old woman, never smoker with a history of right upper extremity DVT, seen in August for an abnormal CT chest that showed some anterior right middle lobe linear opacity consistent with subsegmental atelectasis versus scar.  She also had some mild right paratracheal lymphadenopathy of unclear significance.  We have plan to repeat her CT scan in February 2021.   Observations/Objective: Unfortunately she reports that she was diagnosed with COVID-19.  Her diagnosis was made 1/8, she was admitted to [facility], discharged 1/20. She feels better but is having difficulty sleeping, a lot of cough, some dyspnea. She has non-productive cough. She isn't sure the albuterol is helping her.   Assessment and Plan: Persistent symptoms after COVID 19. Cough and SOB She needa a repeat CXR to look for clearance of infiltrates, any evolving scar. We were planning for a repeat Ct chest in February which we should still do Try an alternative cough syrup w hydrocodone, see if she tolerates. Has not been able to take codeine in past.  OK to keep albuterol for prn Needs a repeat walking oximetry appointment when she is allowed to come to office (21 days after dx).   Follow Up Instructions: 1 week w RB or APP   I discussed the assessment and treatment plan with the patient. The patient was provided an opportunity to ask questions and all were answered. The patient agreed with the plan and demonstrated an understanding of the instructions.   The patient  was advised to call back or seek an in-person evaluation if the symptoms worsen or if the condition fails to improve as anticipated.  I provided 18 minutes of non-face-to-face time during this encounter.   Collene Gobble, MD

## 2019-10-29 NOTE — Telephone Encounter (Signed)
MD completed forms.. notified pt and Lauren Wolfe/[employer] forms has been faxed this am to [phone].Marland KitchenJohny Chess

## 2019-10-30 ENCOUNTER — Telehealth: Payer: Self-pay | Admitting: Emergency Medicine

## 2019-10-30 ENCOUNTER — Ambulatory Visit: Payer: 59 | Admitting: Neurology

## 2019-10-30 ENCOUNTER — Ambulatory Visit (HOSPITAL_COMMUNITY)
Admission: RE | Admit: 2019-10-30 | Discharge: 2019-10-30 | Disposition: A | Discharge status: Home / Self Care | Payer: 59 | Source: Ambulatory Visit | Attending: Medical | Admitting: Medical

## 2019-10-30 ENCOUNTER — Other Ambulatory Visit: Payer: Self-pay

## 2019-10-30 DIAGNOSIS — U071 COVID-19: Secondary | ICD-10-CM | POA: Insufficient documentation

## 2019-10-30 DIAGNOSIS — R0602 Shortness of breath: Secondary | ICD-10-CM | POA: Diagnosis present

## 2019-10-30 DIAGNOSIS — R05 Cough: Secondary | ICD-10-CM

## 2019-10-30 DIAGNOSIS — R059 Cough, unspecified: Secondary | ICD-10-CM

## 2019-10-31 NOTE — Telephone Encounter (Signed)
Pt calling back for x-ray results.  See original message.  [phone].
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
## 2019-11-03 ENCOUNTER — Telehealth (INDEPENDENT_AMBULATORY_CARE_PROVIDER_SITE_OTHER): Payer: 59 | Admitting: Neurology

## 2019-11-03 ENCOUNTER — Telehealth: Payer: Self-pay | Admitting: Emergency Medicine

## 2019-11-03 ENCOUNTER — Encounter: Payer: Self-pay | Admitting: Neurology

## 2019-11-03 DIAGNOSIS — G43709 Chronic migraine without aura, not intractable, without status migrainosus: Secondary | ICD-10-CM

## 2019-11-03 DIAGNOSIS — IMO0002 Reserved for concepts with insufficient information to code with codable children: Secondary | ICD-10-CM

## 2019-11-03 NOTE — Telephone Encounter (Signed)
Patient is returning phone call.  Patient phone number is [phone].

## 2019-11-03 NOTE — Telephone Encounter (Signed)
Called and spoke to pt. Spoke with pt about her CXR, the results were relayed through MyChart by Chana Bode, PA, pt viewed the interpreted results on 1.31.2021. Pt states she still has several questions and would like a follow up. Per Dr. Lamonte Sakai last note on 1.27.21 pt needs a 1 week OV with RB or APP, appt schedule with Dr. Lamonte Sakai on 2.3.21. Pt verbalized understanding and denied any further questions or concerns at this time.

## 2019-11-03 NOTE — Telephone Encounter (Signed)
See phone note from 1.28.21. Pt has upcoming visit with Dr. Lamonte Sakai on 2.3.21. Will sign off.

## 2019-11-03 NOTE — Telephone Encounter (Signed)
Discussed this with Lauren- office protocol is that results should come from the ordering provider's office, but since Covid clinic has limited office hours we need to know the protocol for how to deliver results to patients.  Lauren is checking on this protocol with director of Covid clinic.  Will hold message until we get clarification.

## 2019-11-03 NOTE — Telephone Encounter (Signed)
lmtcb X1 for pt. This xray was taken at [facility] and ordered through the Covid clinic.  Patient should call that facility for results.

## 2019-11-03 NOTE — Telephone Encounter (Signed)
Pt calling back to get her results.

## 2019-11-04 NOTE — Progress Notes (Signed)
Subjective:    Patient ID: Lauren Wolfe, female    DOB: 11/18/1975, 44 y.o.   MRN: [id number]  HPI The patient is here for follow up from the hospital.  Admitted 10/18/19 - 10/22/19 for acute resp failure due to covid.  Her symptoms started 1/4 with headaches.  She got tested for covid because her daughter tested positive.  Her test was positive on 1/8.  She started to have fever, SOB, primarily dry cough, loss of taste and nausea.  Her PCP prescribed albuterol and tessalon perles, but her symptoms worsened.  She went to he ED and and her oxygen saturation was 87% on RA and she was started on 2 L oxygen via [place].  She was weak and tired.  Her taste and smell had improved.  CXR was positive for bibasilar opacities.  D-dimer was elevated and CT of chest was done due to severe DOE and hypoxia which was negative for PE.  She was stared on remdesivir and cough syrup.  She did not receive steroids due to allergy to them.  She was weaned off oxygen. She was discharged to home.  She had her last remdesivir infusion as an outpatient.    On 1/25 she was still having cough, SOB and headaches.  Her headaches were better.    1/27 she went to the respiratory clinic.  She had a chest xray the next day that showed - bilateral atelectasis at the basis, left more than right. Lungs otherwise clear.  Inspiration was shallow.  She was advised albuterol prn, cough syrup as needed.  She was advised her symptoms would improve with time.    Bending over and going up and down stair is still difficult.  She is to rest frequently and sit down in the middle of doing her ADLs.  She does have difficulty breathing in cold air and going up and down the stairs.  She does have a virtual visit with her pulmonologist later today.  She is concerned about the x-ray that she had on the 27th.  She does have a CT scan of her chest/lungs scheduled for next week.       Medications and allergies reviewed with patient and updated  if appropriate.  Patient Active Problem List   Diagnosis Date Noted  . Cough 10/29/2019  . SOB (shortness of breath) 10/27/2019  . Pneumonia due to COVID-19 virus 10/20/2019  . COVID-19 10/18/2019  . Abnormal chest CT 04/21/2019  . Chest pain 04/21/2019  . Hypertrophy, fat pad, infrapatellar 03/20/2019  . Left knee pain 03/04/2019  . Left groin pain 03/04/2019  . Low back pain 03/04/2019  . HLD (hyperlipidemia) 10/21/2018  . Chronic migraine 04/23/2017  . Rash 04/19/2017  . Arm DVT (deep venous thromboembolism), acute, right ([place]) 02/22/2017  . Occipital headache 07/17/2013  . Impingement syndrome of right shoulder 03/04/2013  . Preventative health care 09/24/2012  . Essential hypertension 06/16/2009  . Anxiety state 05/23/2009  . DEPRESSION 05/23/2009  . INSOMNIA-SLEEP DISORDER-UNSPEC 05/19/2009  . Migraine with intractable migraine, so stated, with status migrainosus 04/07/2009  . ALOPECIA 11/30/2008    Current Outpatient Medications on File Prior to Visit  Medication Sig Dispense Refill  . ascorbic acid (VITAMIN C) 500 MG tablet Take 1 tablet (500 mg total) by mouth daily. 10 tablet 0  . Diclofenac Sodium (PENNSAID) 2 % SOLN Place 2 g onto the skin 2 (two) times daily. (Patient taking differently: Place 2 g onto the skin 2 (two) times daily  as needed (pain). ) 112 g 3  . Fremanezumab-vfrm (AJOVY) 225 MG/1.5ML SOSY Inject 225 mg into the skin every 30 (thirty) days. 1.5 mL 11  . HYDROcodone-homatropine (HYCODAN) 5-1.5 MG/5ML syrup Take 5 mLs by mouth every 6 (six) hours as needed for cough. 240 mL 0  . ibuprofen (ADVIL) 600 MG tablet TAKE 1 TABLET (600 MG TOTAL) BY MOUTH EVERY 8 (EIGHT) HOURS. (Patient taking differently: Take 600 mg by mouth every 8 (eight) hours as needed for fever, headache, mild pain, moderate pain or cramping. ) 90 tablet 1  . levonorgestrel (MIRENA) 20 MCG/24HR IUD 1 each by Intrauterine route once.    . methocarbamol (ROBAXIN) 750 MG tablet Take 1 tablet  (750 mg total) by mouth 3 (three) times daily as needed (muscle spasm/pain). 15 tablet 6  . ondansetron (ZOFRAN) 4 MG tablet Take 1 tablet (4 mg total) by mouth every 8 (eight) hours as needed for nausea or vomiting. 20 tablet 1  . ondansetron (ZOFRAN-ODT) 8 MG disintegrating tablet Take 1 tablet (8 mg total) by mouth every 8 (eight) hours as needed for nausea or vomiting. 20 tablet 6  . SUMAtriptan (IMITREX) 6 MG/0.5ML SOLN injection One injection at onset of migraine.  May repeat in 2 hrs, if needed.  Max dose: 2 inj/day. This is a 30 day prescription. 5 mL 11  . tiZANidine (ZANAFLEX) 4 MG tablet TAKE ONE TABLET BY MOUTH THREE TIMES A DAY AS NEEDED FOR MIGRAINES FOR 30 DAYS (Patient taking differently: Take 4 mg by mouth every 8 (eight) hours as needed for muscle spasms. ) 30 tablet 6  . triamcinolone cream (KENALOG) 0.1 % APPLY TO AFFECTED AREA TWICE A DAY 30 g 2  . Vitamin D, Ergocalciferol, (DRISDOL) 1.25 MG (50000 UT) CAPS capsule TAKE 1 CAPSULE (50,000 UNITS TOTAL) BY MOUTH EVERY 7 (SEVEN) DAYS. 4 capsule 2  . zinc sulfate 220 (50 Zn) MG capsule Take 1 capsule (220 mg total) by mouth daily. 10 capsule 0   No current facility-administered medications on file prior to visit.    Past Medical History:  Diagnosis Date  . Anxiety   . Arm vein blood clot, right   . Knee pain, left 01/2019  . Migraine   . Wears glasses    and contacts    Past Surgical History:  Procedure Laterality Date  . BREAST BIOPSY  10/31/2012   Procedure: BREAST BIOPSY WITH NEEDLE LOCALIZATION;  Surgeon: Rolm Bookbinder, MD;  Location: [facility];  Service: General;  Laterality: Right;  right breast wire guided excisional biopsy  . BREAST EXCISIONAL BIOPSY Right 2014  . ganglion cysts  2011  . WRIST SURGERY Left     Social History   Socioeconomic History  . Marital status: Single    Spouse name: Not on file  . Number of children: 1  . Years of education: some college  . Highest education  level: Not on file  Occupational History  . 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 Smoking status: Never Smoker  . Smokeless tobacco: Never Used  Substance and Sexual Activity  . Alcohol use: No  . Drug use: No  . Sexual activity: Yes  Other Topics Concern  . Not on file  Social History Narrative   Lives at home with her daughter.   Right-handed.   No caffeine use.   Social Determinants of Health   Financial Resource Strain:   . Difficulty of Paying Living Expenses: Not on file  Food Insecurity:   . Worried About Charity fundraiser in the Last Year: Not on file  . Ran Out of Food in the Last Year: Not on file  Transportation Needs:   . Lack of Transportation (Medical): Not on file  . Lack of Transportation (Non-Medical): Not on file  Physical Activity:   . Days of Exercise per Week: Not on file  . Minutes of Exercise per Session: Not on file  Stress:   . Feeling of Stress : Not on file  Social Connections:   . Frequency of Communication with Friends and Family: Not on file  . Frequency of Social Gatherings with Friends and Family: Not on file  . Attends Religious Services: Not on file  . Active Member of Clubs or Organizations: Not on file  . Attends Archivist Meetings: Not on file  . Marital Status: Not on file    Family History  Problem Relation Age of Onset  . Hypertension Mother   . Aneurysm Mother   . Stomach cancer Father   . Other Other        grandparent with DJD  . Hypertension Other        grandparent  . Diabetes Other        grandparent  . Gout Other        grandfather  . Breast cancer Other        grandmother  . Migraines Paternal Aunt   . Breast cancer Cousin   . Breast cancer Maternal Grandmother     Review of Systems  Constitutional: Positive for fatigue. Negative for chills and fever.       Appetite good, drinking a good amount of fluids  HENT: Negative for congestion, ear pain, sinus pressure and sore throat.         Taste better, but no 100%  Respiratory: Positive for cough (dry) and shortness of breath (with exertion). Negative for chest tightness and wheezing.   Cardiovascular: Positive for chest pain (if laying on left side and with cold air).  Gastrointestinal: Positive for nausea (with eating). Negative for diarrhea.  Musculoskeletal: Negative for arthralgias and myalgias.  Neurological: Positive for headaches (only a couple of times). Negative for dizziness and light-headedness.       Objective:   Vitals:   11/05/19 0852  BP: 134/88  Pulse: 79  Resp: 16  Temp: 98.3 F (36.8 C)  SpO2: 97%   BP Readings from Last 3 Encounters:  11/05/19 134/88  10/29/19 140/80  10/23/19 128/89   Wt Readings from Last 3 Encounters:  11/05/19 215 lb (97.5 kg)  10/29/19 214 lb (97.1 kg)  10/19/19 207 lb 14.4 oz (94.3 kg)   Body mass index is 34.7 kg/m.   Physical Exam    Constitutional: Appears well-developed and well-nourished. No distress.  HENT:  Head: Normocephalic and atraumatic.  Neck: Neck supple. No tracheal deviation present. No thyromegaly present.  No cervical lymphadenopathy Cardiovascular: Normal rate, regular rhythm and normal heart sounds.   No murmur heard. No carotid bruit .  No edema Pulmonary/Chest: Effort normal and breath sounds normal. No respiratory distress. No has no wheezes. No rales.  Skin: Skin is warm and dry. Not diaphoretic.  Psychiatric: Normal mood and affect. Behavior is normal.   Lab Results  Component Value Date   WBC 5.3 10/18/2019   HGB 14.3 10/18/2019   HCT 44.4 10/18/2019   PLT 415 (H) 10/18/2019   GLUCOSE 92 10/18/2019   CHOL 191 10/21/2018  TRIG 162 (H) 10/18/2019   HDL 60.40 10/21/2018   LDLDIRECT 164.5 09/19/2012   LDLCALC 109 (H) 10/21/2018   ALT 37 10/18/2019   AST 36 10/18/2019   NA 140 10/18/2019   K 3.6 10/18/2019   CL 102 10/18/2019   CREATININE 0.87 10/18/2019   BUN 12 10/18/2019   CO2 27 10/18/2019   TSH 1.85 10/21/2018     DG Chest 2 View CLINICAL DATA:  Cough and shortness of breath. Recent COVID-19 positive  EXAM: CHEST - 2 VIEW  COMPARISON:  October 18, 2019  FINDINGS: There is atelectatic change in the lung bases. The lungs elsewhere are clear. Note shallow degree of inspiration.  Heart size and pulmonary vascularity are normal. No adenopathy. No bone lesions.  IMPRESSION: Shallow degree of inspiration, a finding that potentially may indicate a degree of underlying restrictive lung disease. There is atelectatic change in the lower lung regions bilaterally, slightly more on the left than on the right. Lungs elsewhere clear. Normal cardiac silhouette.  Electronically Signed   By: Lowella Grip III M.D.   On: 10/30/2019 09:52    Assessment & Plan:    See Problem List for Assessment and Plan of chronic medical problems.    This visit occurred during the SARS-CoV-2 public health emergency.  Safety protocols were in place, including screening questions prior to the visit, additional usage of staff PPE, and extensive cleaning of exam room while observing appropriate contact time as indicated for disinfecting solutions.

## 2019-11-05 ENCOUNTER — Encounter: Payer: Self-pay | Admitting: Emergency Medicine

## 2019-11-05 ENCOUNTER — Inpatient Hospital Stay: Payer: 59 | Admitting: Internal Medicine

## 2019-11-05 ENCOUNTER — Ambulatory Visit (INDEPENDENT_AMBULATORY_CARE_PROVIDER_SITE_OTHER): Payer: 59 | Admitting: Internal Medicine

## 2019-11-05 ENCOUNTER — Telehealth (INDEPENDENT_AMBULATORY_CARE_PROVIDER_SITE_OTHER): Payer: 59 | Admitting: Emergency Medicine

## 2019-11-05 ENCOUNTER — Other Ambulatory Visit: Payer: Self-pay

## 2019-11-05 ENCOUNTER — Encounter: Payer: Self-pay | Admitting: Internal Medicine

## 2019-11-05 DIAGNOSIS — R9389 Abnormal findings on diagnostic imaging of other specified body structures: Secondary | ICD-10-CM

## 2019-11-05 DIAGNOSIS — R05 Cough: Secondary | ICD-10-CM | POA: Diagnosis not present

## 2019-11-05 DIAGNOSIS — U071 COVID-19: Secondary | ICD-10-CM

## 2019-11-05 DIAGNOSIS — R059 Cough, unspecified: Secondary | ICD-10-CM

## 2019-11-05 DIAGNOSIS — R0602 Shortness of breath: Secondary | ICD-10-CM | POA: Diagnosis not present

## 2019-11-05 NOTE — Assessment & Plan Note (Signed)
In the aftermath of COVID-19.  She is improving.

## 2019-11-05 NOTE — Patient Instructions (Signed)
Continue to treat your symptoms with over the counter medications.

## 2019-11-05 NOTE — Progress Notes (Signed)
Virtual Visit via Video Note  I connected with Draven S Mcelhinney on 11/05/19 at  1:30 PM EST by a video enabled telemedicine application and verified that I am speaking with the correct person using two identifiers.  Location: Patient: Home Provider: Office   I discussed the limitations of evaluation and management by telemedicine and the availability of in person appointments. The patient expressed understanding and agreed to proceed.  History of Present Illness: 44 year old never smoker with a history of right extremity DVT.  I met her for evaluation of a CT scan from August 2020 that showed some anterior right middle lobe atelectasis/scar, right paratracheal lymphadenopathy of unclear significance.  Plan was to repeat her CT this month.  She was diagnosed with COVID-19 1/8, continue to have significant cough when I saw her on 1/27 video visit.  Chest x-ray 1/28   Observations/Objective: Chest x-ray 1/28 reviewed, showed basilar atelectatic changes left greater than right, otherwise normal.  Her repeat CT scan of the chest is planned for 11/13/2019. She still has some exertional SOB, walking stairs, bending. Her cough is better - she did not try the the codeine cough syrup. She has been using mucinex DM. She has a pulse oximeter - has not seen any desaturations   Assessment and Plan: Abnormal chest CT Repeat CT chest planned for 11/13/2019.  I will follow up with her in office after that to review the scan, compare with her priors, determine whether any subsequent work-up needs to take place  Cough  In the aftermath of COVID-19.  She is improving.  SOB (shortness of breath) Suspect that she needs full pulmonary function testing to clarify whether there is any obstructive lung disease present.  Suspect also that most of her dyspnea relates to some restrictive lung disease and some deconditioning due to her COVID-19 infection.    Follow Up Instructions: After her CT chest on 2/11.    I  discussed the assessment and treatment plan with the patient. The patient was provided an opportunity to ask questions and all were answered. The patient agreed with the plan and demonstrated an understanding of the instructions.   The patient was advised to call back or seek an in-person evaluation if the symptoms worsen or if the condition fails to improve as anticipated.  I provided 20 minutes of non-face-to-face time during this encounter.   Collene Gobble, MD

## 2019-11-05 NOTE — Assessment & Plan Note (Signed)
Repeat CT chest planned for 11/13/2019.  I will follow up with her in office after that to review the scan, compare with her priors, determine whether any subsequent work-up needs to take place

## 2019-11-05 NOTE — Assessment & Plan Note (Signed)
Suspect that she needs full pulmonary function testing to clarify whether there is any obstructive lung disease present.  Suspect also that most of her dyspnea relates to some restrictive lung disease and some deconditioning due to her COVID-19 infection.

## 2019-11-05 NOTE — Assessment & Plan Note (Signed)
Recently diagnosed with Covid-was hospitalized for several days for acute hypoxic respiratory failure secondary to Covid Completed remdesivir Did not receive steroids because of allergy Still experiencing shortness of breath, fatigue-both of which are likely related to her recent Covid infection Recent chest x-ray did not reveal any pneumonia.  She is concerned about some of the findings on the chest x-ray and will discuss with her pulmonologist.  She has a CT scan of her chest scheduled for next week I expect her symptoms to continue to improve Symptomatic treatment of cough Not currently taking the inhaler because it affected her sleep and caused anxiety She does have a pulse oximeter coming today or tomorrow and will start monitoring her oxygenation, especially with ambulating We will keep her out of work for now She is currently on short-term disability We will tentatively set a return to work date as 2/17 part-time initially-4 hours a day She will return full-time to [employer] be determined She will call with any questions or concerns

## 2019-11-06 ENCOUNTER — Telehealth: Payer: Self-pay

## 2019-11-06 ENCOUNTER — Encounter: Payer: Self-pay | Admitting: Family Medicine

## 2019-11-06 ENCOUNTER — Ambulatory Visit (INDEPENDENT_AMBULATORY_CARE_PROVIDER_SITE_OTHER): Payer: 59 | Admitting: Family Medicine

## 2019-11-06 DIAGNOSIS — M25562 Pain in left knee: Secondary | ICD-10-CM | POA: Diagnosis not present

## 2019-11-06 NOTE — Progress Notes (Signed)
[facility] [address] [place] Phone: [phone] Subjective:   I Lauren Wolfe am serving as a Education administrator for Dr. Hulan Saas.  This visit occurred during the SARS-CoV-2 public health emergency.  Safety protocols were in place, including screening questions prior to the visit, additional usage of staff PPE, and extensive cleaning of exam room while observing appropriate contact time as indicated for disinfecting solutions.   I'm seeing this patient by the request  of:  Biagio Borg, MD  CC: Left knee pain follow-up  [id number]   09/17/2019 Patient is x-ray showing the minimal to moderate arthritic changes.  Patient hopefully will respond very well to viscosupplementation.  Due to patient's young age I do not think that surgical intervention is necessary.  Pain has been going on for quite some time and advanced imaging could be warranted but I do not think a high level of unlikely.  Plan holding the patient in the next 2 to 3 months will be bacterial 100% patient will follow up with me again in 4 to 6 weeks.  11/06/2019 Lauren Wolfe is a 44 y.o. female coming in with complaint of left knee pain. Patient states she is still in pain but has more better days. Hurts with weather change. Had covid in January and states that her activity level went down which she believes is the cause to the pain.  Patient states that did not have any significant pain to the knee.      Past Medical History:  Diagnosis Date  . Anxiety   . Arm vein blood clot, right   . Knee pain, left 01/2019  . Migraine   . Wears glasses    and contacts   Past Surgical History:  Procedure Laterality Date  . BREAST BIOPSY  10/31/2012   Procedure: BREAST BIOPSY WITH NEEDLE LOCALIZATION;  Surgeon: Rolm Bookbinder, MD;  Location: [facility];  Service: General;  Laterality: Right;  right breast wire guided excisional biopsy  . BREAST EXCISIONAL BIOPSY  Right 2014  . ganglion cysts  2011  . WRIST SURGERY Left    Social History   Socioeconomic History  . Marital status: Single    Spouse name: Not on file  . Number of children: 1  . Years of education: some college  . Highest education level: Not on file  Occupational History  . 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 Smoking status: Never Smoker  . Smokeless tobacco: Never Used  Substance and Sexual Activity  . Alcohol use: No  . Drug use: No  . Sexual activity: Yes  Other Topics Concern  . Not on file  Social History Narrative   Lives at home with her daughter.   Right-handed.   No caffeine use.   Social Determinants of Health   Financial Resource Strain:   . Difficulty of Paying Living Expenses: Not on file  Food Insecurity:   . Worried About Charity fundraiser in the Last Year: Not on file  . Ran Out of Food in the Last Year: Not on file  Transportation Needs:   . Lack of Transportation (Medical): Not on file  . Lack of Transportation (Non-Medical): Not on file  Physical Activity:   . Days of Exercise per Week: Not on file  . Minutes of Exercise per Session: Not on file  Stress:   . Feeling of Stress : Not on file  Social Connections:   .  Frequency of Communication with Friends and Family: Not on file  . Frequency of Social Gatherings with Friends and Family: Not on file  . Attends Religious Services: Not on file  . Active Member of Clubs or Organizations: Not on file  . Attends Archivist Meetings: Not on file  . Marital Status: Not on file   Allergies  Allergen Reactions  . Azithromycin Shortness Of Breath    REACTION: rash, prurutis  . Codeine Shortness Of Breath    Causes shortness of breath, rash, itching  . Hydrocodone Shortness Of Breath    Rash, itching  . Ketorolac Tromethamine Shortness Of Breath    Itching, rash  . Prednisone     SOB  . Tramadol     itching   Family History  Problem Relation Age of Onset  .  Hypertension Mother   . Aneurysm Mother   . Stomach cancer Father   . Other Other        grandparent with DJD  . Hypertension Other        grandparent  . Diabetes Other        grandparent  . Gout Other        grandfather  . Breast cancer Other        grandmother  . Migraines Paternal Aunt   . Breast cancer Cousin   . Breast cancer Maternal Grandmother     Current Outpatient Medications (Endocrine & Metabolic):  .  levonorgestrel (MIRENA) 20 MCG/24HR IUD, 1 each by Intrauterine route once.   Current Outpatient Medications (Respiratory):  .  HYDROcodone-homatropine (HYCODAN) 5-1.5 MG/5ML syrup, Take 5 mLs by mouth every 6 (six) hours as needed for cough.  Current Outpatient Medications (Analgesics):  Marland Kitchen  Fremanezumab-vfrm (AJOVY) 225 MG/1.5ML SOSY, Inject 225 mg into the skin every 30 (thirty) days. Marland Kitchen  ibuprofen (ADVIL) 600 MG tablet, TAKE 1 TABLET (600 MG TOTAL) BY MOUTH EVERY 8 (EIGHT) HOURS. (Patient taking differently: Take 600 mg by mouth every 8 (eight) hours as needed for fever, headache, mild pain, moderate pain or cramping. ) .  SUMAtriptan (IMITREX) 6 MG/0.5ML SOLN injection, One injection at onset of migraine.  May repeat in 2 hrs, if needed.  Max dose: 2 inj/day. This is a 30 day prescription.   Current Outpatient Medications (Other):  .  ascorbic acid (VITAMIN C) 500 MG tablet, Take 1 tablet (500 mg total) by mouth daily. .  Diclofenac Sodium (PENNSAID) 2 % SOLN, Place 2 g onto the skin 2 (two) times daily. (Patient taking differently: Place 2 g onto the skin 2 (two) times daily as needed (pain). ) .  methocarbamol (ROBAXIN) 750 MG tablet, Take 1 tablet (750 mg total) by mouth 3 (three) times daily as needed (muscle spasm/pain). .  ondansetron (ZOFRAN) 4 MG tablet, Take 1 tablet (4 mg total) by mouth every 8 (eight) hours as needed for nausea or vomiting. .  ondansetron (ZOFRAN-ODT) 8 MG disintegrating tablet, Take 1 tablet (8 mg total) by mouth every 8 (eight) hours as  needed for nausea or vomiting. Marland Kitchen  tiZANidine (ZANAFLEX) 4 MG tablet, TAKE ONE TABLET BY MOUTH THREE TIMES A DAY AS NEEDED FOR MIGRAINES FOR 30 DAYS (Patient taking differently: Take 4 mg by mouth every 8 (eight) hours as needed for muscle spasms. ) .  triamcinolone cream (KENALOG) 0.1 %, APPLY TO AFFECTED AREA TWICE A DAY .  Vitamin D, Ergocalciferol, (DRISDOL) 1.25 MG (50000 UT) CAPS capsule, TAKE 1 CAPSULE (50,000 UNITS TOTAL) BY MOUTH EVERY  7 (SEVEN) DAYS. Marland Kitchen  zinc sulfate 220 (50 Zn) MG capsule, Take 1 capsule (220 mg total) by mouth daily.   Reviewed prior external information including notes and imaging from  primary care provider As well as notes that were available from care everywhere and other healthcare systems.  Past medical history, social, surgical and family history all reviewed in electronic medical record.  No pertanent information unless stated regarding to the chief complaint.   Review of Systems:  No headache, visual changes, nausea, vomiting, diarrhea, constipation, dizziness, abdominal pain, skin rash, fevers, chills, night sweats, weight loss, swollen lymph nodes, body aches, joint swelling, chest pain, shortness of breath, mood changes. POSITIVE muscle aches  Objective  Blood pressure 100/80, pulse 81, height 5' 6"  (1.676 m), weight 214 lb (97.1 kg), SpO2 96 %.   General: No apparent distress alert and oriented x3 mood and affect normal, dressed appropriately.  HEENT: Pupils equal, extraocular movements intact  Respiratory: Patient's speak in full sentences and does not appear short of breath  Cardiovascular: No lower extremity edema, non tender, no erythema  Skin: Warm dry intact with no signs of infection or rash on extremities or on axial skeleton.  Abdomen: Soft nontender  Neuro: Cranial nerves II through XII are intact, neurovascularly intact in all extremities with 2+ DTRs and 2+ pulses.  Lymph: No lymphadenopathy of posterior or anterior cervical chain or  axillae bilaterally.  Gait normal with good balance and coordination.  MSK:  tender with full range of motion and good stability and symmetric strength and tone of shoulders, elbows, wrist, hip, and ankles bilaterally.  Left knee exam is significantly improved at this time.  Mild pain over the medial joint line noted.  Patient does not have any significant instability.  Still mild positive patellar grind test noted.   Impression and Recommendations:     This case required medical decision making of moderate complexity. The above documentation has been reviewed and is accurate and complete Lyndal Pulley, DO       Note: This dictation was prepared with Dragon dictation along with smaller phrase technology. Any transcriptional errors that result from this process are unintentional.

## 2019-11-06 NOTE — Assessment & Plan Note (Signed)
Patient is doing significantly better after the viscosupplementation.  Multiple other comorbidities that are occurring.  Patient is following up with the other physicians.  Patient can follow-up with me for the knee more on an as-needed basis.

## 2019-11-06 NOTE — Telephone Encounter (Signed)
Called John back had to leave msg faxed ov 11/05/19 to # given below../lm,b

## 2019-11-06 NOTE — Telephone Encounter (Signed)
John with [employer] calling and is requesting to speak with Lucy. States that he has worked with her before and is requesting a copy of the most recent progress report. Please advise. Fax#: [phone] ref#: [id number] CB#: [phone]

## 2019-11-06 NOTE — Patient Instructions (Signed)
Once you can breathe you can walk 2 baby Asprin daily for the next 2 weeks  See me when you need me

## 2019-11-11 ENCOUNTER — Emergency Department (HOSPITAL_COMMUNITY): Payer: 59

## 2019-11-11 ENCOUNTER — Emergency Department (HOSPITAL_COMMUNITY)
Admission: EM | Admit: 2019-11-11 | Discharge: 2019-11-12 | Disposition: A | Discharge status: Home / Self Care | Payer: 59 | Source: Home / Self Care | Attending: Emergency Medicine | Admitting: Emergency Medicine

## 2019-11-11 ENCOUNTER — Encounter (HOSPITAL_COMMUNITY): Payer: Self-pay

## 2019-11-11 ENCOUNTER — Telehealth: Payer: Self-pay | Admitting: Internal Medicine

## 2019-11-11 ENCOUNTER — Emergency Department (HOSPITAL_COMMUNITY)
Admission: EM | Admit: 2019-11-11 | Discharge: 2019-11-11 | Disposition: A | Discharge status: Home / Self Care | Payer: 59 | Attending: Emergency Medicine | Admitting: Emergency Medicine

## 2019-11-11 ENCOUNTER — Other Ambulatory Visit: Payer: Self-pay

## 2019-11-11 ENCOUNTER — Encounter (HOSPITAL_COMMUNITY): Payer: Self-pay | Admitting: Emergency Medicine

## 2019-11-11 ENCOUNTER — Telehealth: Payer: Self-pay | Admitting: Emergency Medicine

## 2019-11-11 DIAGNOSIS — I10 Essential (primary) hypertension: Secondary | ICD-10-CM | POA: Diagnosis not present

## 2019-11-11 DIAGNOSIS — R0789 Other chest pain: Secondary | ICD-10-CM | POA: Insufficient documentation

## 2019-11-11 DIAGNOSIS — Z8616 Personal history of COVID-19: Secondary | ICD-10-CM | POA: Insufficient documentation

## 2019-11-11 DIAGNOSIS — Z79899 Other long term (current) drug therapy: Secondary | ICD-10-CM | POA: Insufficient documentation

## 2019-11-11 DIAGNOSIS — Z7982 Long term (current) use of aspirin: Secondary | ICD-10-CM | POA: Insufficient documentation

## 2019-11-11 DIAGNOSIS — Z86718 Personal history of other venous thrombosis and embolism: Secondary | ICD-10-CM | POA: Insufficient documentation

## 2019-11-11 LAB — BASIC METABOLIC PANEL
Anion gap: 8 (ref 5–15)
Anion gap: 11 (ref 5–15)
BUN: 12 mg/dL (ref 6–20)
BUN: 7 mg/dL (ref 6–20)
CO2: 23 mmol/L (ref 22–32)
CO2: 24 mmol/L (ref 22–32)
Calcium: 9 mg/dL (ref 8.9–10.3)
Calcium: 9.6 mg/dL (ref 8.9–10.3)
Chloride: 107 mmol/L (ref 98–111)
Chloride: 104 mmol/L (ref 98–111)
Creatinine, Ser: 0.87 mg/dL (ref 0.44–1.00)
Creatinine, Ser: 0.84 mg/dL (ref 0.44–1.00)
GFR calc Af Amer: >60 mL/min (ref >60)
GFR calc Af Amer: >60 mL/min (ref >60)
GFR calc non Af Amer: >60 mL/min (ref >60)
GFR calc non Af Amer: >60 mL/min (ref >60)
Glucose, Bld: 100 mg/dL — ABNORMAL HIGH (ref 70–99)
Glucose, Bld: 86 mg/dL (ref 70–99)
Potassium: 3.8 mmol/L (ref 3.5–5.1)
Potassium: 3.5 mmol/L (ref 3.5–5.1)
Sodium: 138 mmol/L (ref 135–145)
Sodium: 139 mmol/L (ref 135–145)

## 2019-11-11 LAB — CBC
HCT: 42.5 % (ref 36.0–46.0)
HCT: 45.6 % (ref 36.0–46.0)
Hemoglobin: 13.8 g/dL (ref 12.0–15.0)
Hemoglobin: 14.8 g/dL (ref 12.0–15.0)
MCH: 30 pg (ref 26.0–34.0)
MCH: 29.8 pg (ref 26.0–34.0)
MCHC: 32.5 g/dL (ref 30.0–36.0)
MCHC: 32.5 g/dL (ref 30.0–36.0)
MCV: 92.4 fL (ref 80.0–100.0)
MCV: 91.8 fL (ref 80.0–100.0)
Platelets: 231 10*3/uL (ref 150–400)
Platelets: 266 10*3/uL (ref 150–400)
RBC: 4.6 MIL/uL (ref 3.87–5.11)
RBC: 4.97 MIL/uL (ref 3.87–5.11)
RDW: 14.4 % (ref 11.5–15.5)
RDW: 14.2 % (ref 11.5–15.5)
WBC: 6.5 10*3/uL (ref 4.0–10.5)
WBC: 6 10*3/uL (ref 4.0–10.5)
nRBC: 0 % (ref 0.0–0.2)
nRBC: 0 % (ref 0.0–0.2)

## 2019-11-11 LAB — PROTIME-INR
INR: 1 (ref 0.8–1.2)
Prothrombin Time: 12.7 seconds (ref 11.4–15.2)

## 2019-11-11 LAB — TROPONIN I (HIGH SENSITIVITY)
Troponin I (High Sensitivity): <2 ng/L (ref <18)
Troponin I (High Sensitivity): 5 ng/L (ref <18)

## 2019-11-11 LAB — I-STAT BETA HCG BLOOD, ED (MC, WL, AP ONLY)
I-stat hCG, quantitative: <5 m[IU]/mL (ref <5)
I-stat hCG, quantitative: <5 m[IU]/mL (ref <5)

## 2019-11-11 LAB — D-DIMER, QUANTITATIVE: D-Dimer, Quant: 0.62 ug/mL-FEU — ABNORMAL HIGH (ref 0.00–0.50)

## 2019-11-11 MED ORDER — IOHEXOL 350 MG/ML SOLN
100.0000 mL | Freq: Once | INTRAVENOUS | Status: AC | PRN
  Administered 2019-11-11: 100 mL via INTRAVENOUS

## 2019-11-11 MED ORDER — SODIUM CHLORIDE (PF) 0.9 % IJ SOLN
INTRAMUSCULAR | Status: AC
  Filled 2019-11-11: qty 50

## 2019-11-11 MED ORDER — SODIUM CHLORIDE 0.9% FLUSH
3.0000 mL | Freq: Once | INTRAVENOUS | Status: AC
  Administered 2019-11-11: 3 mL via INTRAVENOUS

## 2019-11-11 MED ORDER — LIDOCAINE 5 % EX PTCH: 1.0000 | MEDICATED_PATCH | CUTANEOUS | 0 refills | Status: DC

## 2019-11-11 MED ORDER — SODIUM CHLORIDE 0.9% FLUSH: 3.0000 mL | Freq: Once | INTRAVENOUS | Status: DC

## 2019-11-11 NOTE — Telephone Encounter (Signed)
Patient had a visit on 2/3 with Dr.Burns due to [place]. She then stated having chest pain today and went to ED (2/9). In her blood work today they told her it was high and could mean blood clot but did not see one in her lung. She stated she is worried because she had a blood clot in her arm 3 years ago. She wants to know if she should be on blood thinners, ED stated she needs to ask her PCP.   Please advise or follow up with patient. Thank you.

## 2019-11-11 NOTE — ED Triage Notes (Signed)
Pt tested positive for COVID early last month. She reports that she has been having chest pain for 2 days and the pain woke her up out of her sleep tonight. Ambulatory, A&Ox4.

## 2019-11-11 NOTE — ED Notes (Signed)
RN reviewed d/c paperwork. Time for questions and answers provided. Patient reports she is waiting on her daughter to come pick her up at this time.  Patient is ambulatory and declines wheelchair at this time.  Patient reports she has no questions at this time.

## 2019-11-11 NOTE — ED Notes (Signed)
Pt transported to CT ?

## 2019-11-11 NOTE — ED Notes (Signed)
Pt ambulated to the restroom independently without issues.

## 2019-11-11 NOTE — Telephone Encounter (Signed)
I called and spoke with the patient and made her aware of Dr. Agustina Caroli recommendations. She verbalized understanding. I advised her to call us if she has increased or new symptoms.

## 2019-11-11 NOTE — Telephone Encounter (Signed)
Ok to hold on blood thinners if no blood clot found in ED 2/9, as the risk of the blood thinners may be more than the risk of covid 19 related clot at this point

## 2019-11-11 NOTE — ED Provider Notes (Signed)
[facility] DEPT Provider Note   CSN: [id number] Arrival date & time: 11/11/19  0547     History Chief Complaint  Patient presents with  . Chest Pain    Lauren Wolfe is a 44 y.o. female who presents to the ED with cc of CP. Recent hx of covid infection and hospitalization last month. She has had difficulty breathing at times and is following with pulmdeonolgy. She has had left sided cp x 2 days wheich she describes as sharp, constant, worse with certain positions, non-exertional. She denies pressure, sob, nausea, vomiting or diaphoresis. Today the pain woke her from sleep and she decided to come in.   HPI     Past Medical History:  Diagnosis Date  . Anxiety   . Arm vein blood clot, right   . Knee pain, left 01/2019  . Migraine   . Wears glasses    and contacts    Patient Active Problem List   Diagnosis Date Noted  . Cough 10/29/2019  . SOB (shortness of breath) 10/27/2019  . Pneumonia due to COVID-19 virus 10/20/2019  . COVID-19 10/18/2019  . Abnormal chest CT 04/21/2019  . Chest pain 04/21/2019  . Hypertrophy, fat pad, infrapatellar 03/20/2019  . Left knee pain 03/04/2019  . Left groin pain 03/04/2019  . Low back pain 03/04/2019  . HLD (hyperlipidemia) 10/21/2018  . Chronic migraine 04/23/2017  . Rash 04/19/2017  . Arm DVT (deep venous thromboembolism), acute, right ([place]) 02/22/2017  . Occipital headache 07/17/2013  . Impingement syndrome of right shoulder 03/04/2013  . Preventative health care 09/24/2012  . Essential hypertension 06/16/2009  . Anxiety state 05/23/2009  . DEPRESSION 05/23/2009  . INSOMNIA-SLEEP DISORDER-UNSPEC 05/19/2009  . Migraine with intractable migraine, so stated, with status migrainosus 04/07/2009  . ALOPECIA 11/30/2008    Past Surgical History:  Procedure Laterality Date  . BREAST BIOPSY  10/31/2012   Procedure: BREAST BIOPSY WITH NEEDLE LOCALIZATION;  Surgeon: Rolm Bookbinder, MD;  Location:  [facility];  Service: General;  Laterality: Right;  right breast wire guided excisional biopsy  . BREAST EXCISIONAL BIOPSY Right 2014  . ganglion cysts  2011  . WRIST SURGERY Left      OB History   No obstetric history on file.     Family History  Problem Relation Age of Onset  . Hypertension Mother   . Aneurysm Mother   . Stomach cancer Father   . Other Other        grandparent with DJD  . Hypertension Other        grandparent  . Diabetes Other        grandparent  . Gout Other        grandfather  . Breast cancer Other        grandmother  . Migraines Paternal Aunt   . Breast cancer Cousin   . Breast cancer Maternal Grandmother     Social History   Tobacco Use  . Smoking status: Never Smoker  . Smokeless tobacco: Never Used  Substance Use Topics  . Alcohol use: No  . Drug use: No    Home Medications Prior to Admission medications   Medication Sig Start Date End Date Taking? Authorizing Provider  ascorbic acid (VITAMIN C) 500 MG tablet Take 1 tablet (500 mg total) by mouth daily. 10/23/19   Lucky Cowboy, MD  Diclofenac Sodium (PENNSAID) 2 % SOLN Place 2 g onto the skin 2 (two) times daily. Patient taking differently: Place  2 g onto the skin 2 (two) times daily as needed (pain).  03/20/19   Lyndal Pulley, DO  Fremanezumab-vfrm (AJOVY) 225 MG/1.5ML SOSY Inject 225 mg into the skin every 30 (thirty) days. 06/30/19   Marcial Pacas, MD  HYDROcodone-homatropine [facility]) 5-1.5 MG/5ML syrup Take 5 mLs by mouth every 6 (six) hours as needed for cough. 10/29/19   Collene Gobble, MD  ibuprofen (ADVIL) 600 MG tablet TAKE 1 TABLET (600 MG TOTAL) BY MOUTH EVERY 8 (EIGHT) HOURS. Patient taking differently: Take 600 mg by mouth every 8 (eight) hours as needed for fever, headache, mild pain, moderate pain or cramping.  07/08/19   Hudnall, Sharyn Lull, MD  levonorgestrel (MIRENA) 20 MCG/24HR IUD 1 each by Intrauterine route once.    [provider]  methocarbamol  (ROBAXIN) 750 MG tablet Take 1 tablet (750 mg total) by mouth 3 (three) times daily as needed (muscle spasm/pain). 06/30/19   Marcial Pacas, MD  ondansetron (ZOFRAN) 4 MG tablet Take 1 tablet (4 mg total) by mouth every 8 (eight) hours as needed for nausea or vomiting. 10/14/19   Biagio Borg, MD  ondansetron (ZOFRAN-ODT) 8 MG disintegrating tablet Take 1 tablet (8 mg total) by mouth every 8 (eight) hours as needed for nausea or vomiting. 06/30/19   Marcial Pacas, MD  SUMAtriptan (IMITREX) 6 MG/0.5ML SOLN injection One injection at onset of migraine.  May repeat in 2 hrs, if needed.  Max dose: 2 inj/day. This is a 30 day prescription. 06/30/19   Marcial Pacas, MD  tiZANidine (ZANAFLEX) 4 MG tablet TAKE ONE TABLET BY MOUTH THREE TIMES A DAY AS NEEDED FOR MIGRAINES FOR 30 DAYS Patient taking differently: Take 4 mg by mouth every 8 (eight) hours as needed for muscle spasms.  06/30/19   Marcial Pacas, MD  triamcinolone cream (KENALOG) 0.1 % APPLY TO AFFECTED AREA TWICE A DAY 10/26/19   Biagio Borg, MD  Vitamin D, Ergocalciferol, (DRISDOL) 1.25 MG (50000 UT) CAPS capsule TAKE 1 CAPSULE (50,000 UNITS TOTAL) BY MOUTH EVERY 7 (SEVEN) DAYS. 08/15/19   Lyndal Pulley, DO  zinc sulfate 220 (50 Zn) MG capsule Take 1 capsule (220 mg total) by mouth daily. 10/23/19   Lucky Cowboy, MD    Allergies    Azithromycin, Codeine, Hydrocodone, Ketorolac tromethamine, Prednisone, and Tramadol  Review of Systems   Review of Systems Ten systems reviewed and are negative for acute change, except as noted in the HPI.   Physical Exam Updated Vital Signs BP (!) 153/101 (BP Location: Right Arm)   Pulse 67   Temp 98.5 F (36.9 C) (Oral)   Resp 17   Ht 5' 6"  (1.676 m)   Wt 97.5 kg   SpO2 94%   BMI 34.70 kg/m   Physical Exam Vitals and nursing note reviewed. Exam conducted with a chaperone present.  Constitutional:      General: She is not in acute distress.    Appearance: She is well-developed. She is not diaphoretic.  HENT:      Head: Normocephalic and atraumatic.  Eyes:     General: No scleral icterus.    Conjunctiva/sclera: Conjunctivae normal.  Cardiovascular:     Rate and Rhythm: Normal rate and regular rhythm.     Heart sounds: Normal heart sounds. No murmur. No friction rub. No gallop.   Pulmonary:     Effort: Pulmonary effort is normal. No respiratory distress.     Breath sounds: Normal breath sounds.  Chest:  Chest wall: Tenderness present.    Abdominal:     General: Bowel sounds are normal. There is no distension.     Palpations: Abdomen is soft. There is no mass.     Tenderness: There is no abdominal tenderness. There is no guarding.  Musculoskeletal:     Cervical back: Normal range of motion.     Right lower leg: No tenderness. No edema.     Left lower leg: No tenderness. No edema.  Skin:    General: Skin is warm and dry.  Neurological:     Mental Status: She is alert and oriented to person, place, and time.  Psychiatric:        Behavior: Behavior normal.     ED Results / Procedures / Treatments   Labs (all labs ordered are listed, but only abnormal results are displayed) Labs Reviewed  BASIC METABOLIC PANEL  CBC  D-DIMER, QUANTITATIVE (NOT AT [facility])  I-STAT BETA HCG BLOOD, ED ([facility], [facility], [facility] ONLY)  TROPONIN I (HIGH SENSITIVITY)    EKG None  Radiology DG Chest 2 View  Result Date: 11/11/2019 CLINICAL DATA:  Chest pain EXAM: CHEST - 2 VIEW COMPARISON:  10/30/2019 FINDINGS: Low volume chest with streaky density on both sides that is stable. Normal heart size and aortic contours. No edema, effusion, or pneumothorax. IMPRESSION: Stable low volume chest with streaky opacity on both sides that could be atelectasis or scarring. Electronically Signed   By: Monte Fantasia M.D.   On: 11/11/2019 06:23    Procedures Procedures (including critical care time)  Medications Ordered in ED Medications  sodium chloride flush (NS) 0.9 % injection 3 mL (has no administration in time range)     ED Course  I have reviewed the triage vital signs and the nursing notes.  Pertinent labs & imaging results that were available during my care of the patient were reviewed by me and considered in my medical decision making (see chart for details).    MDM Rules/Calculators/A&P                      44 year old female here with chest pain.  Patient definitely has reproducible chest wall pain on examination.  I have pending work-up with troponin, basic chemistry panel, CBC and D-dimer.  Have given signout to PA Marisue Brooklyn who will assume care of the patient Final Clinical Impression(s) / ED Diagnoses Final diagnoses:  None    Rx / DC Orders ED Discharge Orders    None       Margarita Mail, PA-C 11/11/19 0645    Merrily Pew, MD 11/19/19 2256

## 2019-11-11 NOTE — Telephone Encounter (Signed)
Please let her know that there is been improvement on her CT scan of the chest.  The areas of inflammation seen on her prior have resolved.  There is still a small amount of poor inflation at the bottom of each lung.  Overall improved.  We can cancel the CT scan that we had originally planned  I don't think a muscle relaxer is going to help with the kind of discomfort she describes. Suspect this is costochondritis or intercostal muscle pain from her prior coughing. Would try ibuprofen as needed for this pain, keep her follow up as we have planned.

## 2019-11-11 NOTE — ED Provider Notes (Signed)
Lauren Wolfe is a 44 y.o. female, presenting to the ED with chest discomfort she states has been present for the last 2 days.  HPI from Margarita Mail, PA-C: "Lauren Wolfe is a 44 y.o. female who presents to the ED with cc of CP. Recent hx of covid infection and hospitalization last month. She has had difficulty breathing at times and is following with pulmdeonolgy. She has had left sided cp x 2 days wheich she describes as sharp, constant, worse with certain positions, non-exertional. She denies pressure, sob, nausea, vomiting or diaphoresis. Today the pain woke her from sleep and she decided to come in."  Past Medical History:  Diagnosis Date  . Anxiety   . Arm vein blood clot, right   . Knee pain, left 01/2019  . Migraine   . Wears glasses    and contacts    Physical Exam  BP 134/85   Pulse 78   Temp 98.5 F (36.9 C) (Oral)   Resp 17   Ht 5' 6"  (1.676 m)   Wt 97.5 kg   SpO2 100%   BMI 34.70 kg/m   Physical Exam Vitals and nursing note reviewed.  Constitutional:      General: She is not in acute distress.    Appearance: She is well-developed. She is obese. She is not diaphoretic.  HENT:     Head: Normocephalic and atraumatic.     Mouth/Throat:     Mouth: Mucous membranes are moist.     Pharynx: Oropharynx is clear.  Eyes:     Conjunctiva/sclera: Conjunctivae normal.  Cardiovascular:     Rate and Rhythm: Normal rate and regular rhythm.     Pulses: Normal pulses.          Radial pulses are 2+ on the right side and 2+ on the left side.       Posterior tibial pulses are 2+ on the right side and 2+ on the left side.     Heart sounds: Normal heart sounds.     Comments: Tactile temperature in the extremities appropriate and equal bilaterally. Pulmonary:     Effort: Pulmonary effort is normal. No respiratory distress.     Breath sounds: Normal breath sounds.  Chest:    Abdominal:     Palpations: Abdomen is soft.     Tenderness: There is no abdominal  tenderness. There is no guarding.  Musculoskeletal:     Cervical back: Neck supple.     Right lower leg: No edema.     Left lower leg: No edema.  Lymphadenopathy:     Cervical: No cervical adenopathy.  Skin:    General: Skin is warm and dry.  Neurological:     Mental Status: She is alert.  Psychiatric:        Mood and Affect: Mood and affect normal.        Speech: Speech normal.        Behavior: Behavior normal.     ED Course/Procedures     Procedures   Abnormal Labs Reviewed  BASIC METABOLIC PANEL - Abnormal; Notable for the following components:      Result Value   Glucose, Bld 100 (*)    All other components within normal limits  D-DIMER, QUANTITATIVE (NOT AT [facility]) - Abnormal; Notable for the following components:   D-Dimer, Quant 0.62 (*)    All other components within normal limits     DG Chest 2 View  Result Date: 11/11/2019 CLINICAL DATA:  Chest pain  EXAM: CHEST - 2 VIEW COMPARISON:  10/30/2019 FINDINGS: Low volume chest with streaky density on both sides that is stable. Normal heart size and aortic contours. No edema, effusion, or pneumothorax. IMPRESSION: Stable low volume chest with streaky opacity on both sides that could be atelectasis or scarring. Electronically Signed   By: Monte Fantasia M.D.   On: 11/11/2019 06:23   CT Angio Chest PE W and/or Wo Contrast  Result Date: 11/11/2019 CLINICAL DATA:  Elevated D-dimer, chest pain for 2 days EXAM: CT ANGIOGRAPHY CHEST WITH CONTRAST TECHNIQUE: Multidetector CT imaging of the chest was performed using the standard protocol during bolus administration of intravenous contrast. Multiplanar CT image reconstructions and MIPs were obtained to evaluate the vascular anatomy. CONTRAST:  135m OMNIPAQUE IOHEXOL 350 MG/ML SOLN COMPARISON:  10/18/2019 FINDINGS: Cardiovascular: Satisfactory opacification of the pulmonary arteries to the segmental level. No evidence of pulmonary embolism. Normal heart size. No pericardial effusion.  Mediastinum/Nodes: No enlarged mediastinal, hilar, or axillary lymph nodes. Thyroid gland, trachea, and esophagus demonstrate no significant findings. Lungs/Pleura: No pleural effusion or pneumothorax. Right middle lobe, lingular and left lower lobe atelectasis. Upper Abdomen: No acute upper abdominal abnormality. Musculoskeletal: No acute osseous abnormality. No aggressive osseous lesion. Review of the MIP images confirms the above findings. IMPRESSION: 1. No evidence of a pulmonary embolus. 2. No acute cardiopulmonary disease. 3. Right middle lobe, lingular and left lower lobe atelectasis. Electronically Signed   By: HKathreen Devoid  On: 11/11/2019 08:28     EKG Interpretation  Date/Time:  Tuesday November 11 2019 06:22:08 EST Ventricular Rate:  75 PR Interval:    QRS Duration: 85 QT Interval:  396 QTC Calculation: 443 R Axis:   24 Text Interpretation: Sinus rhythm Consider anterior infarct Baseline wander in lead(s) V1 No significant change since last tracing Confirmed by MMerrily [phone] on 11/11/2019 6:47:07 AM       MDM   Clinical Course as of Nov 10 925  Tue Nov 11, 2019  0650 Declines analgesia at this time.   [SJ]    Clinical Course User Index [SJ] JLorayne Bender PA-C   Patient care handoff report received from AMargarita Mail PA-C. Plan: Patient awaiting lab results, review and disposition accordingly.  Patient presents with 2 days of chest discomfort, tender on exam. Patient is nontoxic appearing, afebrile, not tachycardic, not tachypneic, not hypotensive, maintains excellent SPO2 on room air, and is in no apparent distress.  Her D-dimer was mildly elevated.  Chest CT without evidence of actionable abnormality. Initial troponin negative.  Patient has been having pain consistently over the last 2 days.  We discussed second troponin with shared decision making, ultimately we decided to forego this second test. No acute change or signs of ischemia on EKG. Follows with Dr.  BLamonte Sakai pulmonology. Scheduled for CT chest this week.  I recommended she call Dr. BAgustina Carolioffice, tell them about her ED visit, and ask whether she still needs to have her chest CT this week. She has already been prescribed diclofenac topical gel and Robaxin for this issue. Return precautions discussed.  Patient voices understanding of these instructions, accepts the plan, and is comfortable with discharge.    Vitals:   11/11/19 0630 11/11/19 0700 11/11/19 0830 11/11/19 0853  BP: 134/79 134/90 134/90 (!) 161/96  Pulse: 68 69  61  Resp: 14 16  18   Temp:      TempSrc:      SpO2: 97% 94%  98%  Weight:  Height:           Layla Maw 11/11/19 3299    Blanchie Dessert, MD 11/11/19 [phone]

## 2019-11-11 NOTE — Telephone Encounter (Signed)
I called and spoke with the patient and she states that she was seen in the ER today and shes still having chest pain. Her D-dimer was elevated but CT was negative for PE. She has an appointment with you for 11/14/19. She was wondering what she can do in the meantime and she says she never received the muscle relaxer that you mentioned at her last visit. Please advise.

## 2019-11-11 NOTE — Discharge Instructions (Addendum)
Your work-up today was overall reassuring.  Please follow-up with pulmonologist on this matter.  Methocarbamol: Methocarbamol (generic for Robaxin) is a muscle relaxer and can help relieve stiff muscles or muscle spasms.  Do not drive or perform other dangerous activities while taking this medication as it can cause drowsiness as well as changes in reaction time and judgement. Lidocaine patches: These are available via either prescription or over-the-counter. The over-the-counter option may be more economical one and are likely just as effective. There are multiple over-the-counter brands, such as Salonpas. Diclofenac gel: This is a topical anti-inflammatory medication and can be applied directly to the painful region.  Do not use on the face or genitals.  This medication may be used as an alternative to oral anti-inflammatory medications, such as ibuprofen or naproxen.

## 2019-11-11 NOTE — ED Triage Notes (Signed)
Patient reports mid chest pain onset yesterday with occasional dry cough , denies SOB / no emesis or diaphoresis .

## 2019-11-12 LAB — TROPONIN I (HIGH SENSITIVITY): Troponin I (High Sensitivity): 2 ng/L (ref <18)

## 2019-11-12 MED ORDER — IBUPROFEN 800 MG PO TABS: 800.0000 mg | ORAL_TABLET | Freq: Three times a day (TID) | ORAL | 0 refills | Status: DC

## 2019-11-12 MED ORDER — ONDANSETRON 4 MG PO TBDP
4.0000 mg | ORAL_TABLET | Freq: Once | ORAL | Status: AC
  Administered 2019-11-12: 4 mg via ORAL
  Filled 2019-11-12: qty 1

## 2019-11-12 MED ORDER — CYCLOBENZAPRINE HCL 10 MG PO TABS: 10.0000 mg | ORAL_TABLET | Freq: Two times a day (BID) | ORAL | 0 refills | Status: DC | PRN

## 2019-11-12 NOTE — ED Provider Notes (Signed)
[facility] EMERGENCY DEPARTMENT Provider Note   CSN: [id number] Arrival date & time: 11/11/19  2202     History Chief Complaint  Patient presents with  . Chest Pain    Lauren Wolfe is a 44 y.o. female.  Patient presents to the emergency department with a chief complaint of chest pain.  She states that she was diagnosed with coronavirus several weeks ago.  She was hospitalized.  She states that she has been having chest pain for the past 3 days.  She states that her cough has resolved.  She has had some vomiting.  She denies any fevers or chills.  Hx of blood clots. She was seen yesterday at [facility], and had a CT angio chest which showed no evidence of PE.  She states that she continues to have soreness.  The pain is worsened with palpation.  She describes it as stabbing.  The history is provided by the patient. No language interpreter was used.       Past Medical History:  Diagnosis Date  . Anxiety   . Arm vein blood clot, right   . Knee pain, left 01/2019  . Migraine   . Wears glasses    and contacts    Patient Active Problem List   Diagnosis Date Noted  . Cough 10/29/2019  . SOB (shortness of breath) 10/27/2019  . Pneumonia due to COVID-19 virus 10/20/2019  . COVID-19 10/18/2019  . Abnormal chest CT 04/21/2019  . Chest pain 04/21/2019  . Hypertrophy, fat pad, infrapatellar 03/20/2019  . Left knee pain 03/04/2019  . Left groin pain 03/04/2019  . Low back pain 03/04/2019  . HLD (hyperlipidemia) 10/21/2018  . Chronic migraine 04/23/2017  . Rash 04/19/2017  . Arm DVT (deep venous thromboembolism), acute, right ([place]) 02/22/2017  . Occipital headache 07/17/2013  . Impingement syndrome of right shoulder 03/04/2013  . Preventative health care 09/24/2012  . Essential hypertension 06/16/2009  . Anxiety state 05/23/2009  . DEPRESSION 05/23/2009  . INSOMNIA-SLEEP DISORDER-UNSPEC 05/19/2009  . Migraine with intractable migraine, so stated, with  status migrainosus 04/07/2009  . ALOPECIA 11/30/2008    Past Surgical History:  Procedure Laterality Date  . BREAST BIOPSY  10/31/2012   Procedure: BREAST BIOPSY WITH NEEDLE LOCALIZATION;  Surgeon: Rolm Bookbinder, MD;  Location: [place];  Service: General;  Laterality: Right;  right breast wire guided excisional biopsy  . BREAST EXCISIONAL BIOPSY Right 2014  . ganglion cysts  2011  . WRIST SURGERY Left      OB History   No obstetric history on file.     Family History  Problem Relation Age of Onset  . Hypertension Mother   . Aneurysm Mother   . Stomach cancer Father   . Other Other        grandparent with DJD  . Hypertension Other        grandparent  . Diabetes Other        grandparent  . Gout Other        grandfather  . Breast cancer Other        grandmother  . Migraines Paternal Aunt   . Breast cancer Cousin   . Breast cancer Maternal Grandmother     Social History   Tobacco Use  . Smoking status: Never Smoker  . Smokeless tobacco: Never Used  Substance Use Topics  . Alcohol use: No  . Drug use: No    Home Medications Prior to Admission medications  Medication Sig Start Date End Date Taking? Authorizing Provider  ascorbic acid (VITAMIN C) 500 MG tablet Take 1 tablet (500 mg total) by mouth daily. 10/23/19   Lucky Cowboy, MD  aspirin EC 81 MG tablet Take 81 mg by mouth daily.    [provider]  Diclofenac Sodium (PENNSAID) 2 % SOLN Place 2 g onto the skin 2 (two) times daily. Patient taking differently: Place 2 g onto the skin 2 (two) times daily as needed (For knee pain.).  03/20/19   Lyndal Pulley, DO  Fremanezumab-vfrm (AJOVY) 225 MG/1.5ML SOSY Inject 225 mg into the skin every 30 (thirty) days. 06/30/19   Marcial Pacas, MD  HYDROcodone-homatropine [facility]) 5-1.5 MG/5ML syrup Take 5 mLs by mouth every 6 (six) hours as needed for cough. 10/29/19   Collene Gobble, MD  ibuprofen (ADVIL) 600 MG tablet TAKE 1 TABLET (600 MG TOTAL)  BY MOUTH EVERY 8 (EIGHT) HOURS. Patient taking differently: Take 600 mg by mouth every 8 (eight) hours as needed for fever, headache, mild pain, moderate pain or cramping.  07/08/19   Hudnall, Sharyn Lull, MD  levonorgestrel (MIRENA) 20 MCG/24HR IUD 1 each by Intrauterine route once.    [provider]  lidocaine (LIDODERM) 5 % Place 1 patch onto the skin daily. Remove & Discard patch within 12 hours or as directed by MD 11/11/19   Joy, Shawn C, PA-C  methocarbamol (ROBAXIN) 750 MG tablet Take 1 tablet (750 mg total) by mouth 3 (three) times daily as needed (muscle spasm/pain). Patient taking differently: Take 750 mg by mouth 3 (three) times daily as needed (For migraines.).  06/30/19   Marcial Pacas, MD  ondansetron (ZOFRAN) 4 MG tablet Take 1 tablet (4 mg total) by mouth every 8 (eight) hours as needed for nausea or vomiting. 10/14/19   Biagio Borg, MD  ondansetron (ZOFRAN-ODT) 8 MG disintegrating tablet Take 1 tablet (8 mg total) by mouth every 8 (eight) hours as needed for nausea or vomiting. 06/30/19   Marcial Pacas, MD  SUMAtriptan (IMITREX) 6 MG/0.5ML SOLN injection One injection at onset of migraine.  May repeat in 2 hrs, if needed.  Max dose: 2 inj/day. This is a 30 day prescription. Patient taking differently: Inject 6 mg into the skin every 2 (two) hours as needed for migraine (One injection at onset.  May repeat in 2 hrs, if needed.  Max dose: 2 inj/day.).  06/30/19   Marcial Pacas, MD  tiZANidine (ZANAFLEX) 4 MG tablet TAKE ONE TABLET BY MOUTH THREE TIMES A DAY AS NEEDED FOR MIGRAINES FOR 30 DAYS Patient taking differently: Take 4 mg by mouth every 8 (eight) hours as needed (For migraines.).  06/30/19   Marcial Pacas, MD  triamcinolone cream (KENALOG) 0.1 % APPLY TO AFFECTED AREA TWICE A DAY Patient taking differently: Apply 1 application topically 2 (two) times daily. Applies for eczema. 10/26/19   Biagio Borg, MD  Vitamin D, Ergocalciferol, (DRISDOL) 1.25 MG (50000 UT) CAPS capsule TAKE 1 CAPSULE  (50,000 UNITS TOTAL) BY MOUTH EVERY 7 (SEVEN) DAYS. Patient taking differently: Take 50,000 Units by mouth every Saturday.  08/15/19   Lyndal Pulley, DO  zinc sulfate 220 (50 Zn) MG capsule Take 1 capsule (220 mg total) by mouth daily. 10/23/19   Lucky Cowboy, MD    Allergies    Azithromycin, Codeine, Hydrocodone, Ketorolac tromethamine, Prednisone, and Tramadol  Review of Systems   Review of Systems  All other systems reviewed and are negative.  Physical Exam Updated Vital Signs BP (!) 131/98 (BP Location: Left Arm)   Pulse 74   Temp 98.7 F (37.1 C) (Oral)   Resp 18   SpO2 98%   Physical Exam Vitals and nursing note reviewed.  Constitutional:      General: She is not in acute distress.    Appearance: She is well-developed.  HENT:     Head: Normocephalic and atraumatic.  Eyes:     Conjunctiva/sclera: Conjunctivae normal.  Cardiovascular:     Rate and Rhythm: Normal rate and regular rhythm.     Heart sounds: No murmur.     Comments: Anterior chest wall very tender on the left side Pulmonary:     Effort: Pulmonary effort is normal. No respiratory distress.     Breath sounds: Normal breath sounds.     Comments: Lungs are clear to auscultation Abdominal:     Palpations: Abdomen is soft.     Tenderness: There is no abdominal tenderness.  Musculoskeletal:        General: Normal range of motion.     Cervical back: Neck supple.  Skin:    General: Skin is warm and dry.     Comments: No rash  Neurological:     Mental Status: She is alert and oriented to person, place, and time.  Psychiatric:        Mood and Affect: Mood normal.        Behavior: Behavior normal.     ED Results / Procedures / Treatments   Labs (all labs ordered are listed, but only abnormal results are displayed) Labs Reviewed  BASIC METABOLIC PANEL  CBC  PROTIME-INR  I-STAT BETA HCG BLOOD, ED ([facility], [facility], [facility] ONLY)  TROPONIN I (HIGH SENSITIVITY)  TROPONIN I (HIGH SENSITIVITY)     EKG None  Radiology DG Chest 2 View  Result Date: 11/11/2019 CLINICAL DATA:  Chest pain EXAM: CHEST - 2 VIEW COMPARISON:  November 11, 2019 FINDINGS: The heart size and mediastinal contours are within normal limits. Both lungs are clear. The visualized skeletal structures are unremarkable. IMPRESSION: No active cardiopulmonary disease. Electronically Signed   By: Prudencio Pair M.D.   On: 11/11/2019 22:28   DG Chest 2 View  Result Date: 11/11/2019 CLINICAL DATA:  Chest pain EXAM: CHEST - 2 VIEW COMPARISON:  10/30/2019 FINDINGS: Low volume chest with streaky density on both sides that is stable. Normal heart size and aortic contours. No edema, effusion, or pneumothorax. IMPRESSION: Stable low volume chest with streaky opacity on both sides that could be atelectasis or scarring. Electronically Signed   By: Monte Fantasia M.D.   On: 11/11/2019 06:23   CT Angio Chest PE W and/or Wo Contrast  Result Date: 11/11/2019 CLINICAL DATA:  Elevated D-dimer, chest pain for 2 days EXAM: CT ANGIOGRAPHY CHEST WITH CONTRAST TECHNIQUE: Multidetector CT imaging of the chest was performed using the standard protocol during bolus administration of intravenous contrast. Multiplanar CT image reconstructions and MIPs were obtained to evaluate the vascular anatomy. CONTRAST:  120m OMNIPAQUE IOHEXOL 350 MG/ML SOLN COMPARISON:  10/18/2019 FINDINGS: Cardiovascular: Satisfactory opacification of the pulmonary arteries to the segmental level. No evidence of pulmonary embolism. Normal heart size. No pericardial effusion. Mediastinum/Nodes: No enlarged mediastinal, hilar, or axillary lymph nodes. Thyroid gland, trachea, and esophagus demonstrate no significant findings. Lungs/Pleura: No pleural effusion or pneumothorax. Right middle lobe, lingular and left lower lobe atelectasis. Upper Abdomen: No acute upper abdominal abnormality. Musculoskeletal: No acute osseous abnormality. No aggressive osseous lesion. Review of  the MIP images  confirms the above findings. IMPRESSION: 1. No evidence of a pulmonary embolus. 2. No acute cardiopulmonary disease. 3. Right middle lobe, lingular and left lower lobe atelectasis. Electronically Signed   By: Kathreen Devoid   On: 11/11/2019 08:28    Procedures Procedures (including critical care time)  Medications Ordered in ED Medications  sodium chloride flush (NS) 0.9 % injection 3 mL (has no administration in time range)  ondansetron (ZOFRAN-ODT) disintegrating tablet 4 mg (4 mg Oral Given 11/12/19 3419)    ED Course  I have reviewed the triage vital signs and the nursing notes.  Pertinent labs & imaging results that were available during my care of the patient were reviewed by me and considered in my medical decision making (see chart for details).    MDM Rules/Calculators/A&P                     Patient with chest pain x3 days.  Symptoms are easily reproducible with palpation, I am more concerned for chest wall/musculoskeletal process.  Well appearing.  VSS.  She was seen yesterday at [facility] and had a negative CT PE study.  Labs are reassuring.  Normal troponins.  EKG is unchanged from priors per Dr. Leonides Schanz. Don't think that any additional emergent workup is needed. Will treat with NSAIDs and recommend ice, heat, stretching, and rest.  PCP follow-up.   Final Clinical Impression(s) / ED Diagnoses Final diagnoses:  Chest wall pain    Rx / DC Orders ED Discharge Orders    None       Montine Circle, PA-C 11/12/19 0302    Ward, Delice Bison, DO 11/12/19 [phone]

## 2019-11-12 NOTE — Discharge Instructions (Signed)
You have had extensive evaluations in the ER.  We feel that you are safe for discharge and outpatient follow-up.  Your recent CT scan showed no evidence of blood clots.  Your heart markers have been normal.  Your EKG is without significant changes from priors.  You pain is easily reproducible with palpation and therefore thought to be more in the muscles and/or chest wall.  This may take some time to improve.  Take anti-inflammatories for pain.  Also use ice, heat, and stretching.  Please follow-up with your doctor.

## 2019-11-12 NOTE — Telephone Encounter (Signed)
Pt was advised that she did not have to take blood thinners if no blood clot was found. She verbalized understanding.

## 2019-11-13 ENCOUNTER — Inpatient Hospital Stay: Admission: RE | Admit: 2019-11-13 | Payer: 59 | Source: Ambulatory Visit

## 2019-11-14 ENCOUNTER — Encounter: Payer: Self-pay | Admitting: Emergency Medicine

## 2019-11-14 ENCOUNTER — Ambulatory Visit (INDEPENDENT_AMBULATORY_CARE_PROVIDER_SITE_OTHER): Payer: 59 | Admitting: Emergency Medicine

## 2019-11-14 ENCOUNTER — Other Ambulatory Visit: Payer: Self-pay

## 2019-11-14 DIAGNOSIS — R9389 Abnormal findings on diagnostic imaging of other specified body structures: Secondary | ICD-10-CM

## 2019-11-14 DIAGNOSIS — R0602 Shortness of breath: Secondary | ICD-10-CM

## 2019-11-14 DIAGNOSIS — J1282 Pneumonia due to coronavirus disease 2019: Secondary | ICD-10-CM

## 2019-11-14 DIAGNOSIS — I82621 Acute embolism and thrombosis of deep veins of right upper extremity: Secondary | ICD-10-CM

## 2019-11-14 DIAGNOSIS — U071 COVID-19: Secondary | ICD-10-CM

## 2019-11-14 NOTE — Progress Notes (Signed)
Subjective:    Patient ID: Lauren Wolfe, female    DOB: 18-Dec-1975, 44 y.o.   MRN: [id number]  HPI 44 year old woman, never smoker, with a history of migraines, right upper extremity DVT.  She presents today as a new consultation for evaluation of an abnormal CT scan of the chest.  She was originally evaluated in the ED 7/19 for chest discomfort. In the end she had a reassuring evaluation.  A chest x-ray was done that day that showed slightly increased hazy interstitial markings in the right midlung.  Etiology was unclear, prompted a CT scan of the chest that was done on 05/16/2019 and I reviewed.  This shows some anterior right middle lobe linear opacity most consistent with subsegmental atelectasis, consider scar.  She also had some mild right paratracheal lymphadenopathy.  Significance unclear.   She has never had any chronic pulmonary issues, no asthma, no thoracic surgeries.  She believes that she may have been dx with PNA before, but she is unsure. Denies any breathing trouble. No cough.  The CT abnormalities were found completely spuriously.  ROV 11/14/19 -6 year old woman with history of right upper extremity DVT seen originally following a CT chest done 05/16/2019 after she had chest discomfort.  There was some anterior right middle lobe linear opacity most consistent with atelectasis, consider scar and some mild right paratracheal lymphadenopathy.  She has continued to have on and off chest discomfort, was unfortunately diagnosed with COVID-19 1/8 associated with this and also continued cough.  She went to the emergency department for evaluation 2/9 and a repeat CT chest was performed which I have reviewed, shows no evidence of pulmonary embolism, some mild right middle lobe, lingular and left lower lobe atelectatic change that is improved compared with her prior.  Her lymphadenopathy is resolved. She still has SOB with exertion, slowly getting better. She has some mid-chest pain, has  minimal cough. The mid CP is superficial, ? Msk. No overt GERD sx   Review of Systems  Constitutional: Negative for fever and unexpected weight change.  HENT: Negative for congestion, dental problem, ear pain, nosebleeds, postnasal drip, rhinorrhea, sinus pressure, sneezing, sore throat and trouble swallowing.   Eyes: Negative for redness and itching.  Respiratory: Negative for cough, chest tightness, shortness of breath and wheezing.   Cardiovascular: Negative for palpitations and leg swelling.  Gastrointestinal: Negative for nausea and vomiting.  Genitourinary: Negative for dysuria.  Musculoskeletal: Negative for joint swelling.  Skin: Negative for rash.  Neurological: Negative for headaches.  Hematological: Does not bruise/bleed easily.  Psychiatric/Behavioral: Negative for dysphoric mood. The patient is not nervous/anxious.    Past Medical History:  Diagnosis Date  . Anxiety   . Arm vein blood clot, right   . Knee pain, left 01/2019  . Migraine   . Wears glasses    and contacts     Family History  Problem Relation Age of Onset  . Hypertension Mother   . Aneurysm Mother   . Stomach cancer Father   . Other Other        grandparent with DJD  . Hypertension Other        grandparent  . Diabetes Other        grandparent  . Gout Other        grandfather  . Breast cancer Other        grandmother  . Migraines Paternal Aunt   . Breast cancer Cousin   . Breast cancer Maternal Grandmother  Social History   Socioeconomic History  . Marital status: Single    Spouse name: Not on file  . Number of children: 1  . Years of education: some college  . Highest education level: Not on file  Occupational History  . 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 Smoking status: Never Smoker  . Smokeless tobacco: Never Used  Substance and Sexual Activity  . Alcohol use: No  . Drug use: No  . Sexual activity: Yes  Other Topics Concern  . Not on file  Social  History Narrative   Lives at home with her daughter.   Right-handed.   No caffeine use.   Social Determinants of Health   Financial Resource Strain:   . Difficulty of Paying Living Expenses: Not on file  Food Insecurity:   . Worried About Charity fundraiser in the Last Year: Not on file  . Ran Out of Food in the Last Year: Not on file  Transportation Needs:   . Lack of Transportation (Medical): Not on file  . Lack of Transportation (Non-Medical): Not on file  Physical Activity:   . Days of Exercise per Week: Not on file  . Minutes of Exercise per Session: Not on file  Stress:   . Feeling of Stress : Not on file  Social Connections:   . Frequency of Communication with Friends and Family: Not on file  . Frequency of Social Gatherings with Friends and Family: Not on file  . Attends Religious Services: Not on file  . Active Member of Clubs or Organizations: Not on file  . Attends Archivist Meetings: Not on file  . Marital Status: Not on file  Intimate Partner Violence:   . Fear of Current or Ex-Partner: Not on file  . Emotionally Abused: Not on file  . Physically Abused: Not on file  . Sexually Abused: Not on file   Works baking and as an Chiropractor.  No military [place] native.   Allergies  Allergen Reactions  . Azithromycin Shortness Of Breath, Rash and Other (See Comments)    Prurutis  . Codeine Shortness Of Breath, Itching and Rash  . Hydrocodone Shortness Of Breath, Itching and Rash  . Ketorolac Tromethamine Shortness Of Breath, Itching and Rash  . Prednisone Shortness Of Breath  . Tramadol Itching     Outpatient Medications Prior to Visit  Medication Sig Dispense Refill  . ascorbic acid (VITAMIN C) 500 MG tablet Take 1 tablet (500 mg total) by mouth daily. 10 tablet 0  . aspirin EC 81 MG tablet Take 81 mg by mouth daily.    . cyclobenzaprine (FLEXERIL) 10 MG tablet Take 1 tablet (10 mg total) by mouth 2 (two) times daily as needed for muscle spasms.  20 tablet 0  . Diclofenac Sodium (PENNSAID) 2 % SOLN Place 2 g onto the skin 2 (two) times daily. (Patient taking differently: Place 2 g onto the skin 2 (two) times daily as needed (For knee pain.). ) 112 g 3  . Fremanezumab-vfrm (AJOVY) 225 MG/1.5ML SOSY Inject 225 mg into the skin every 30 (thirty) days. 1.5 mL 11  . HYDROcodone-homatropine (HYCODAN) 5-1.5 MG/5ML syrup Take 5 mLs by mouth every 6 (six) hours as needed for cough. 240 mL 0  . ibuprofen (ADVIL) 800 MG tablet Take 1 tablet (800 mg total) by mouth 3 (three) times daily. 21 tablet 0  . levonorgestrel (MIRENA) 20 MCG/24HR IUD 1 each by Intrauterine route once.    Marland Kitchen  lidocaine (LIDODERM) 5 % Place 1 patch onto the skin daily. Remove & Discard patch within 12 hours or as directed by MD 30 patch 0  . methocarbamol (ROBAXIN) 750 MG tablet Take 1 tablet (750 mg total) by mouth 3 (three) times daily as needed (muscle spasm/pain). (Patient taking differently: Take 750 mg by mouth 3 (three) times daily as needed (For migraines.). ) 15 tablet 6  . ondansetron (ZOFRAN) 4 MG tablet Take 1 tablet (4 mg total) by mouth every 8 (eight) hours as needed for nausea or vomiting. 20 tablet 1  . ondansetron (ZOFRAN-ODT) 8 MG disintegrating tablet Take 1 tablet (8 mg total) by mouth every 8 (eight) hours as needed for nausea or vomiting. 20 tablet 6  . SUMAtriptan (IMITREX) 6 MG/0.5ML SOLN injection One injection at onset of migraine.  May repeat in 2 hrs, if needed.  Max dose: 2 inj/day. This is a 30 day prescription. (Patient taking differently: Inject 6 mg into the skin every 2 (two) hours as needed for migraine (One injection at onset.  May repeat in 2 hrs, if needed.  Max dose: 2 inj/day.). ) 5 mL 11  . tiZANidine (ZANAFLEX) 4 MG tablet TAKE ONE TABLET BY MOUTH THREE TIMES A DAY AS NEEDED FOR MIGRAINES FOR 30 DAYS (Patient taking differently: Take 4 mg by mouth every 8 (eight) hours as needed (For migraines.). ) 30 tablet 6  . triamcinolone cream (KENALOG)  0.1 % APPLY TO AFFECTED AREA TWICE A DAY (Patient taking differently: Apply 1 application topically 2 (two) times daily. Applies for eczema.) 30 g 2  . Vitamin D, Ergocalciferol, (DRISDOL) 1.25 MG (50000 UT) CAPS capsule TAKE 1 CAPSULE (50,000 UNITS TOTAL) BY MOUTH EVERY 7 (SEVEN) DAYS. (Patient taking differently: Take 50,000 Units by mouth every Saturday. ) 4 capsule 2  . zinc sulfate 220 (50 Zn) MG capsule Take 1 capsule (220 mg total) by mouth daily. 10 capsule 0   No facility-administered medications prior to visit.        Objective:   Physical Exam Vitals:   11/14/19 1145  BP: 126/80  Pulse: 70  Temp: (!) 97.2 F (36.2 C)  TempSrc: Temporal  SpO2: 99%  Weight: 216 lb 3.2 oz (98.1 kg)  Height: 5' 6"  (1.676 m)   Gen: Pleasant, overwt woman, in no distress,  normal affect  ENT: No lesions,  mouth clear,  oropharynx clear, no postnasal drip  Neck: No JVD, no stridor  Lungs: No use of accessory muscles, no crackles or wheezing on normal respiration, no wheeze on forced expiration  Cardiovascular: RRR, heart sounds normal, no murmur or gallops, no peripheral edema  Musculoskeletal: No deformities, no cyanosis or clubbing  Neuro: alert, awake, non focal  Skin: Warm, no lesions or rash      Assessment & Plan:  Pneumonia due to COVID-19 virus Still recovering.  Having some residual fatigue, exertional dyspnea.  Unclear whether her chest discomfort is related as well.  SOB (shortness of breath) If her dyspnea persists after 3-4 more months of recovery from her COVID-19 than we should pursue further evaluation with PFT, etc.  Abnormal chest CT Lymphadenopathy has resolved.  She does still have some very mild basilar atelectatic change that I do not believe is clinically significant.  No indication to follow serial CTs at this time.  Do believe there is a restrictive component due to her weight.  Baltazar Apo, MD, PhD 11/14/2019, 1:34 PM [facility] [phone] or if no answer  [phone]  

## 2019-11-14 NOTE — Assessment & Plan Note (Signed)
Still recovering.  Having some residual fatigue, exertional dyspnea.  Unclear whether her chest discomfort is related as well.

## 2019-11-14 NOTE — Assessment & Plan Note (Signed)
Lymphadenopathy has resolved.  She does still have some very mild basilar atelectatic change that I do not believe is clinically significant.  No indication to follow serial CTs at this time.  Do believe there is a restrictive component due to her weight.

## 2019-11-14 NOTE — Assessment & Plan Note (Signed)
If her dyspnea persists after 3-4 more months of recovery from her COVID-19 than we should pursue further evaluation with PFT, etc.

## 2019-11-14 NOTE — Patient Instructions (Addendum)
Your CT scan of the chest shows improvement compared with your prior from August 2020.  Your lymph nodes are all back to normal size.  There is still some very subtle evidence of poor inflation at the bottoms of your lungs but this is not concerning. You may want to try starting over-the-counter reflux medicine to see if it helps your chest pain.  If you decide to do this take it for 2 weeks straight and see if it works. Anticipate that your breathing will continue to improve as you get better from your COVID-19 from January.  If you continue to have shortness of breath 4 to 6 months from now then we should probably arrange for another visit and check pulmonary function testing to further evaluate.

## 2019-11-16 ENCOUNTER — Other Ambulatory Visit: Payer: Self-pay | Admitting: Family Medicine

## 2019-11-18 ENCOUNTER — Encounter: Payer: Self-pay | Admitting: Internal Medicine

## 2019-11-19 NOTE — Progress Notes (Signed)
I have reviewed and agreed above plan. 

## 2019-11-19 NOTE — Telephone Encounter (Signed)
Spoke with pt and gave her advise per Dr. Quay Burow. Will have [employer] reach out to gather more information from [name] in regards to how long to be out of work.

## 2019-11-19 NOTE — Telephone Encounter (Signed)
° ° °  Please return call to patient °

## 2019-11-25 ENCOUNTER — Telehealth: Payer: Self-pay

## 2019-11-25 NOTE — Progress Notes (Signed)
Virtual Visit via Video Note  I connected with Lauren Wolfe on 11/26/19 at  2:45 PM EST by a video enabled telemedicine application and verified that I am speaking with the correct person using two identifiers.   I discussed the limitations of evaluation and management by telemedicine and the availability of in person appointments. The patient expressed understanding and agreed to proceed.  Present for the visit:  Myself, Dr Billey Gosling, Lauren Wolfe.  The patient is currently at home and I am in the office.    No referring provider.    History of Present Illness: She is here for follow up from Lauren Wolfe.  She had COVID last month - her test was positive on 10/08/19.     She went to the ED 2/9 for cough, intermittent chest discomfort and Ct of hte chest showed no PE, some mild atelectasisi in areas of right and left lungs that had improved, no lymphadenoapthy.  She had a follow up with  - they will do PFTs in a few months is SOB continues.  She has occasional SOB.  She has musculoskeletal chest pain, which is the concern.     Chest pain - ongoing for two weeks.  She has been told it was a muscle strain.  Once or twice a day she has a stabbing pain through her chest.  It lasts about 30 seconds.  She takes ibuprofen 1-2 times a day. It takes the edge off of the pain.   She also has a constant pain at the left edge of her upper sternum.  Nothing makes it worse.  Flexeril has not helped.  She has tried voltaren gel and lidocaine patches and it has not helped.    She takes nausea medication, because she feels nauseous. She wonders if this is from the ibuprofen.  She has insomnia.     Review of Systems  Constitutional: Negative for chills and fever.  Respiratory: Positive for shortness of breath (occ). Negative for cough and wheezing.   Cardiovascular: Positive for chest pain (msk).  Neurological: Negative for tingling.     Social History   Socioeconomic History  . Marital status:  Single    Spouse name: Not on file  . Number of children: 1  . Years of education: some college  . Highest education level: Not on file  Occupational History  . 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 Smoking status: Never Smoker  . Smokeless tobacco: Never Used  Substance and Sexual Activity  . Alcohol use: No  . Drug use: No  . Sexual activity: Yes  Other Topics Concern  . Not on file  Social History Narrative   Lives at home with her daughter.   Right-handed.   No caffeine use.   Social Determinants of Health   Financial Resource Strain:   . Difficulty of Paying Living Expenses: Not on file  Food Insecurity:   . Worried About Charity fundraiser in the Last Year: Not on file  . Ran Out of Food in the Last Year: Not on file  Transportation Needs:   . Lack of Transportation (Medical): Not on file  . Lack of Transportation (Non-Medical): Not on file  Physical Activity:   . Days of Exercise per Week: Not on file  . Minutes of Exercise per Session: Not on file  Stress:   . Feeling of Stress : Not on file  Social Connections:   . Frequency of Communication with Friends  and Family: Not on file  . Frequency of Social Gatherings with Friends and Family: Not on file  . Attends Religious Services: Not on file  . Active Member of Clubs or Organizations: Not on file  . Attends Archivist Meetings: Not on file  . Marital Status: Not on file     Observations/Objective: Appears well in NAD Breathing normally, speaking in full sentences Skin appears warma nd dry  Assessment and Plan:  See Problem List for Assessment and Plan of chronic medical problems.   Anticipate return to work mid-end next week w/o restrictions She will call next week and I can write her a RTW note at that time   Follow Up Instructions:    I discussed the assessment and treatment plan with the patient. The patient was provided an opportunity to ask questions and all were  answered. The patient agreed with the plan and demonstrated an understanding of the instructions.   The patient was advised to call back or seek an in-person evaluation if the symptoms worsen or if the condition fails to improve as anticipated.    Binnie Rail, MD

## 2019-11-25 NOTE — Telephone Encounter (Signed)
   Patient calling would like to know if any additional correspondence has been sent to [place] on her behalf.   Please call

## 2019-11-25 NOTE — Telephone Encounter (Signed)
Called pt back and set up a virtual visit with Dr. Quay Burow to update OV notes.

## 2019-11-26 ENCOUNTER — Encounter: Payer: Self-pay | Admitting: Internal Medicine

## 2019-11-26 ENCOUNTER — Ambulatory Visit (INDEPENDENT_AMBULATORY_CARE_PROVIDER_SITE_OTHER): Payer: 59 | Admitting: Internal Medicine

## 2019-11-26 ENCOUNTER — Other Ambulatory Visit: Payer: Self-pay | Admitting: Internal Medicine

## 2019-11-26 DIAGNOSIS — R0789 Other chest pain: Secondary | ICD-10-CM | POA: Diagnosis not present

## 2019-11-26 MED ORDER — TRIAMCINOLONE ACETONIDE 0.1 % EX CREA: TOPICAL_CREAM | CUTANEOUS | 2 refills | Status: DC

## 2019-11-26 MED ORDER — GABAPENTIN 300 MG PO CAPS: 300.0000 mg | ORAL_CAPSULE | Freq: Every day | ORAL | 3 refills | Status: DC

## 2019-11-26 NOTE — Assessment & Plan Note (Signed)
Experiencing chest pain that is musculoskeletal in nature Possibly related to covid infection, cough Ibuprofen, voltaren, felxeril, lidocaine - little to no help Try gabapentin at night which may help with sleep as well Should improve over next week

## 2019-11-26 NOTE — Telephone Encounter (Signed)
Kenalog done erx  No need further high dose vit d - Please change to OTC Vitamin D3 at 2000 units per day, indefinitely.

## 2019-11-26 NOTE — Telephone Encounter (Signed)
New message:   Medication Requested:triamcinolone cream (KENALOG) 0.1 %  Vitamin D, Ergocalciferol, (DRISDOL) 1.25 MG (50000 UNIT) CAPS capsule   Is medication on med list: Yes (if no, inform pt they may need an appointment)  Is medication a controled: (yes = last OV with PCP)  -Controlled Substances: Adderall, Ritalin, oxycodone, hydrocodone, methadone, alprazolam, etc  Last visit with PCP: 11/26/19  Is the OV > than 4 months: No (yes = schedule an appt if one is not already made)  Pharmacy (Name, [place], [place]): [facility] [phone]

## 2019-11-27 ENCOUNTER — Encounter: Payer: Self-pay | Admitting: Internal Medicine

## 2019-11-27 NOTE — Telephone Encounter (Signed)
Patient is calling in regards to her [facility] papers and states she talk with someone yesterday here in the office.

## 2019-11-27 NOTE — Telephone Encounter (Signed)
Mychart message sent.

## 2019-11-28 DIAGNOSIS — Z0279 Encounter for issue of other medical certificate: Secondary | ICD-10-CM

## 2019-11-28 NOTE — Telephone Encounter (Signed)
Forms have been signed, Faxed to [facility], Copy sent to scan &Charged for.   Patient informed and original mailed to patient.

## 2019-12-03 ENCOUNTER — Encounter: Payer: Self-pay | Admitting: Internal Medicine

## 2019-12-10 ENCOUNTER — Encounter: Payer: Self-pay | Admitting: Internal Medicine

## 2019-12-10 MED ORDER — GABAPENTIN 100 MG PO CAPS: 100.0000 mg | ORAL_CAPSULE | Freq: Every day | ORAL | 0 refills | Status: DC

## 2019-12-10 NOTE — Addendum Note (Signed)
Addended by: Binnie Rail on: 12/10/2019 08:48 PM   Modules accepted: Orders

## 2019-12-17 ENCOUNTER — Encounter: Payer: Self-pay | Admitting: Internal Medicine

## 2019-12-17 ENCOUNTER — Ambulatory Visit (INDEPENDENT_AMBULATORY_CARE_PROVIDER_SITE_OTHER): Payer: 59 | Admitting: Internal Medicine

## 2019-12-17 ENCOUNTER — Other Ambulatory Visit: Payer: Self-pay

## 2019-12-17 ENCOUNTER — Other Ambulatory Visit: Payer: Self-pay | Admitting: Internal Medicine

## 2019-12-17 VITALS — BP 128/88 | HR 68 | Temp 97.8°F | Ht 66.0 in | Wt 216.0 lb

## 2019-12-17 DIAGNOSIS — E538 Deficiency of other specified B group vitamins: Secondary | ICD-10-CM | POA: Diagnosis not present

## 2019-12-17 DIAGNOSIS — Z0000 Encounter for general adult medical examination without abnormal findings: Secondary | ICD-10-CM

## 2019-12-17 DIAGNOSIS — E559 Vitamin D deficiency, unspecified: Secondary | ICD-10-CM

## 2019-12-17 DIAGNOSIS — R21 Rash and other nonspecific skin eruption: Secondary | ICD-10-CM

## 2019-12-17 DIAGNOSIS — E611 Iron deficiency: Secondary | ICD-10-CM | POA: Diagnosis not present

## 2019-12-17 LAB — BASIC METABOLIC PANEL
BUN: 13 mg/dL (ref 6–23)
CO2: 29 mEq/L (ref 19–32)
Calcium: 9.3 mg/dL (ref 8.4–10.5)
Chloride: 102 mEq/L (ref 96–112)
Creatinine, Ser: 0.84 mg/dL (ref 0.40–1.20)
GFR: 89.32 mL/min (ref >60.00)
Glucose, Bld: 89 mg/dL (ref 70–99)
Potassium: 3.9 mEq/L (ref 3.5–5.1)
Sodium: 138 mEq/L (ref 135–145)

## 2019-12-17 LAB — VITAMIN D 25 HYDROXY (VIT D DEFICIENCY, FRACTURES): VITD: 28.23 ng/mL — ABNORMAL LOW (ref 30.00–100.00)

## 2019-12-17 LAB — IBC PANEL
Iron: 129 ug/dL (ref 42–145)
Saturation Ratios: 43.1 % (ref 20.0–50.0)
Transferrin: 214 mg/dL (ref 212.0–360.0)

## 2019-12-17 LAB — CBC WITH DIFFERENTIAL/PLATELET
Basophils Absolute: 0 10*3/uL (ref 0.0–0.1)
Basophils Relative: 0.7 % (ref 0.0–3.0)
Eosinophils Absolute: 0.1 10*3/uL (ref 0.0–0.7)
Eosinophils Relative: 1.3 % (ref 0.0–5.0)
HCT: 43.6 % (ref 36.0–46.0)
Hemoglobin: 14.6 g/dL (ref 12.0–15.0)
Lymphocytes Relative: 37.2 % (ref 12.0–46.0)
Lymphs Abs: 2.3 10*3/uL (ref 0.7–4.0)
MCHC: 33.5 g/dL (ref 30.0–36.0)
MCV: 90.3 fl (ref 78.0–100.0)
Monocytes Absolute: 0.5 10*3/uL (ref 0.1–1.0)
Monocytes Relative: 8.3 % (ref 3.0–12.0)
Neutro Abs: 3.3 10*3/uL (ref 1.4–7.7)
Neutrophils Relative %: 52.5 % (ref 43.0–77.0)
Platelets: 269 10*3/uL (ref 150.0–400.0)
RBC: 4.83 Mil/uL (ref 3.87–5.11)
RDW: 14.5 % (ref 11.5–15.5)
WBC: 6.2 10*3/uL (ref 4.0–10.5)

## 2019-12-17 LAB — URINALYSIS, ROUTINE W REFLEX MICROSCOPIC
Bilirubin Urine: NEGATIVE
Hgb urine dipstick: NEGATIVE
Ketones, ur: NEGATIVE
Leukocytes,Ua: NEGATIVE
Nitrite: NEGATIVE
RBC / HPF: NONE SEEN (ref 0–?)
Specific Gravity, Urine: 1.025 (ref 1.000–1.030)
Total Protein, Urine: NEGATIVE
Urine Glucose: NEGATIVE
Urobilinogen, UA: 0.2 (ref 0.0–1.0)
pH: 5.5 (ref 5.0–8.0)

## 2019-12-17 LAB — HEPATIC FUNCTION PANEL
ALT: 18 U/L (ref 0–35)
AST: 15 U/L (ref 0–37)
Albumin: 4.2 g/dL (ref 3.5–5.2)
Alkaline Phosphatase: 66 U/L (ref 39–117)
Bilirubin, Direct: 0.1 mg/dL (ref 0.0–0.3)
Total Bilirubin: 0.4 mg/dL (ref 0.2–1.2)
Total Protein: 7.3 g/dL (ref 6.0–8.3)

## 2019-12-17 LAB — LIPID PANEL
Cholesterol: 234 mg/dL — ABNORMAL HIGH (ref 0–200)
HDL: 60.6 mg/dL (ref >39.00)
LDL Cholesterol: 150 mg/dL — ABNORMAL HIGH (ref 0–99)
NonHDL: 173.06
Total CHOL/HDL Ratio: 4
Triglycerides: 114 mg/dL (ref 0.0–149.0)
VLDL: 22.8 mg/dL (ref 0.0–40.0)

## 2019-12-17 LAB — TSH: TSH: 1.97 u[IU]/mL (ref 0.35–4.50)

## 2019-12-17 LAB — VITAMIN B12: Vitamin B-12: 244 pg/mL (ref 211–911)

## 2019-12-17 MED ORDER — VITAMIN D (ERGOCALCIFEROL) 1.25 MG (50000 UNIT) PO CAPS: 50000.0000 [IU] | ORAL_CAPSULE | ORAL | 2 refills | Status: DC

## 2019-12-17 MED ORDER — CLOBETASOL PROPIONATE 0.05 % EX CREA: 1.0000 "application " | TOPICAL_CREAM | Freq: Two times a day (BID) | CUTANEOUS | 2 refills | Status: DC

## 2019-12-17 NOTE — Progress Notes (Signed)
Subjective:    Patient ID: Lauren Wolfe, female    DOB: 1976-05-18, 44 y.o.   MRN: [id number]  HPI  Here for wellness and f/u;  Overall doing ok;  Pt denies Chest pain, worsening SOB, DOE, wheezing, orthopnea, PND, worsening LE edema, palpitations, dizziness or syncope.  Pt denies neurological change such as new headache, facial or extremity weakness.  Pt denies polydipsia, polyuria, or low sugar symptoms. Pt states overall good compliance with treatment and medications, good tolerability, and has been trying to follow appropriate diet.  Pt denies worsening depressive symptoms, suicidal ideation or panic. No fever, night sweats, wt loss, loss of appetite, or other constitutional symptoms.  Pt states good ability with ADL's, has low fall risk, home safety reviewed and adequate, no other significant changes in hearing or vision, and only occasionally active with exercise.  Has ongoing rash, asks for change triam cr.  Past Medical History:  Diagnosis Date  . Anxiety   . Arm vein blood clot, right   . Knee pain, left 01/2019  . Migraine   . Wears glasses    and contacts   Past Surgical History:  Procedure Laterality Date  . BREAST BIOPSY  10/31/2012   Procedure: BREAST BIOPSY WITH NEEDLE LOCALIZATION;  Surgeon: Rolm Bookbinder, MD;  Location: [facility];  Service: General;  Laterality: Right;  right breast wire guided excisional biopsy  . BREAST EXCISIONAL BIOPSY Right 2014  . ganglion cysts  2011  . WRIST SURGERY Left     reports that she has never smoked. She has never used smokeless tobacco. She reports that she does not drink alcohol or use drugs. family history includes Aneurysm in her mother; Breast cancer in her cousin, maternal grandmother, and another family member; Diabetes in an other family member; Gout in an other family member; Hypertension in her mother and another family member; Migraines in her paternal aunt; Other in an other family member; Stomach cancer  in her father. Allergies  Allergen Reactions  . Azithromycin Shortness Of Breath, Rash and Other (See Comments)    Prurutis  . Codeine Shortness Of Breath, Itching and Rash  . Hydrocodone Shortness Of Breath, Itching and Rash  . Ketorolac Tromethamine Shortness Of Breath, Itching and Rash  . Prednisone Shortness Of Breath  . Tramadol Itching   Current Outpatient Medications on File Prior to Visit  Medication Sig Dispense Refill  . ascorbic acid (VITAMIN C) 500 MG tablet Take 1 tablet (500 mg total) by mouth daily. 10 tablet 0  . aspirin EC 81 MG tablet Take 81 mg by mouth daily.    . cyclobenzaprine (FLEXERIL) 10 MG tablet Take 1 tablet (10 mg total) by mouth 2 (two) times daily as needed for muscle spasms. 20 tablet 0  . Diclofenac Sodium (PENNSAID) 2 % SOLN Place 2 g onto the skin 2 (two) times daily. (Patient taking differently: Place 2 g onto the skin 2 (two) times daily as needed (For knee pain.). ) 112 g 3  . Fremanezumab-vfrm (AJOVY) 225 MG/1.5ML SOSY Inject 225 mg into the skin every 30 (thirty) days. 1.5 mL 11  . gabapentin (NEURONTIN) 100 MG capsule Take 1 capsule (100 mg total) by mouth at bedtime. 30 capsule 0  . HYDROcodone-homatropine (HYCODAN) 5-1.5 MG/5ML syrup Take 5 mLs by mouth every 6 (six) hours as needed for cough. 240 mL 0  . ibuprofen (ADVIL) 800 MG tablet Take 1 tablet (800 mg total) by mouth 3 (three) times daily. 21 tablet 0  .  levonorgestrel (MIRENA) 20 MCG/24HR IUD 1 each by Intrauterine route once.    . lidocaine (LIDODERM) 5 % Place 1 patch onto the skin daily. Remove & Discard patch within 12 hours or as directed by MD 30 patch 0  . methocarbamol (ROBAXIN) 750 MG tablet Take 1 tablet (750 mg total) by mouth 3 (three) times daily as needed (muscle spasm/pain). (Patient taking differently: Take 750 mg by mouth 3 (three) times daily as needed (For migraines.). ) 15 tablet 6  . ondansetron (ZOFRAN) 4 MG tablet Take 1 tablet (4 mg total) by mouth every 8 (eight)  hours as needed for nausea or vomiting. 20 tablet 1  . ondansetron (ZOFRAN-ODT) 8 MG disintegrating tablet Take 1 tablet (8 mg total) by mouth every 8 (eight) hours as needed for nausea or vomiting. 20 tablet 6  . SUMAtriptan (IMITREX) 6 MG/0.5ML SOLN injection One injection at onset of migraine.  May repeat in 2 hrs, if needed.  Max dose: 2 inj/day. This is a 30 day prescription. (Patient taking differently: Inject 6 mg into the skin every 2 (two) hours as needed for migraine (One injection at onset.  May repeat in 2 hrs, if needed.  Max dose: 2 inj/day.). ) 5 mL 11  . tiZANidine (ZANAFLEX) 4 MG tablet TAKE ONE TABLET BY MOUTH THREE TIMES A DAY AS NEEDED FOR MIGRAINES FOR 30 DAYS (Patient taking differently: Take 4 mg by mouth every 8 (eight) hours as needed (For migraines.). ) 30 tablet 6  . triamcinolone cream (KENALOG) 0.1 % APPLY TO AFFECTED AREA TWICE A DAY 30 g 2  . zinc sulfate 220 (50 Zn) MG capsule Take 1 capsule (220 mg total) by mouth daily. 10 capsule 0   No current facility-administered medications on file prior to visit.   Review of Systems All otherwise neg per pt     Objective:   Physical Exam BP 128/88   Pulse 68   Temp 97.8 F (36.6 C)   Ht 5' 6"  (1.676 m)   Wt 216 lb (98 kg)   SpO2 98%   BMI 34.86 kg/m  VS noted,  Constitutional: Pt appears in NAD HENT: Head: NCAT.  Right Ear: External ear normal.  Left Ear: External ear normal.  Eyes: . Pupils are equal, round, and reactive to light. Conjunctivae and EOM are normal Nose: without d/c or deformity Neck: Neck supple. Gross normal ROM Cardiovascular: Normal rate and regular rhythm.   Pulmonary/Chest: Effort normal and breath sounds without rales or wheezing.  Abd:  Soft, NT, ND, + BS, no organomegaly Neurological: Pt is alert. At baseline orientation, motor grossly intact Skin: Skin is warm. No rashes, other new lesions, no LE edema Psychiatric: Pt behavior is normal without agitation  All otherwise neg per  pt Lab Results  Component Value Date   WBC 6.2 12/17/2019   HGB 14.6 12/17/2019   HCT 43.6 12/17/2019   PLT 269.0 12/17/2019   GLUCOSE 89 12/17/2019   CHOL 234 (H) 12/17/2019   TRIG 114.0 12/17/2019   HDL 60.60 12/17/2019   LDLDIRECT 164.5 09/19/2012   LDLCALC 150 (H) 12/17/2019   ALT 18 12/17/2019   AST 15 12/17/2019   NA 138 12/17/2019   K 3.9 12/17/2019   CL 102 12/17/2019   CREATININE 0.84 12/17/2019   BUN 13 12/17/2019   CO2 29 12/17/2019   TSH 1.97 12/17/2019   INR 1.0 11/11/2019      Assessment & Plan:

## 2019-12-17 NOTE — Patient Instructions (Signed)
Ok to change the triamcinolone cream to the generic Temovate  Please continue all other medications as before, and refills have been done if requested.  Please have the pharmacy call with any other refills you may need.  Please continue your efforts at being more active, low cholesterol diet, and weight control.  You are otherwise up to date with prevention measures today.  Please keep your appointments with your specialists as you may have planned  Please go to the LAB at the blood drawing area for the tests to be done  You will be contacted by phone if any changes need to be made immediately.  Otherwise, you will receive a letter about your results with an explanation, but please check with MyChart first.  Please remember to sign up for MyChart if you have not done so, as this will be important to you in the future with finding out test results, communicating by private email, and scheduling acute appointments online when needed.  Please make an Appointment to return for your 1 year visit, or sooner if needed

## 2019-12-17 NOTE — Assessment & Plan Note (Signed)
[place] for change triam to temovate prn

## 2019-12-17 NOTE — Assessment & Plan Note (Signed)

## 2019-12-18 ENCOUNTER — Encounter: Payer: Self-pay | Admitting: Internal Medicine

## 2019-12-22 ENCOUNTER — Encounter: Payer: Self-pay | Admitting: Internal Medicine

## 2019-12-23 MED ORDER — MELOXICAM 15 MG PO TABS: 15.0000 mg | ORAL_TABLET | Freq: Every day | ORAL | 1 refills | Status: DC | PRN

## 2019-12-23 NOTE — Telephone Encounter (Signed)
Please advise on patients my chart message concern. Thank you

## 2019-12-23 NOTE — Addendum Note (Signed)
Addended by: Biagio Borg on: 12/23/2019 12:57 PM   Modules accepted: Orders

## 2020-01-05 ENCOUNTER — Encounter: Payer: Self-pay | Admitting: Neurology

## 2020-01-06 NOTE — Telephone Encounter (Signed)
I reviewed her notes and studies. There is nothing in the workup to explain her chest discomfort, no reason to suggest a lung cause. If continuing to happen then I suggest having her be seen in our office with a CXR to insure there aren't new findings to support a pulmonary cause. Please set her up with APP if she agrees. Thanks.

## 2020-01-07 ENCOUNTER — Other Ambulatory Visit: Payer: Self-pay | Admitting: Neurology

## 2020-01-07 MED ORDER — NURTEC 75 MG PO TBDP: 75.0000 mg | ORAL_TABLET | ORAL | 11 refills | Status: DC | PRN

## 2020-01-07 NOTE — Progress Notes (Signed)
I Have sent in Nurtec for acute headache, per my chart message.    For abortive treatment, she tried different triptan treatment, including Imitrex, Maxalt, Zomig, Frova, Relpax, Amerge with limited response,

## 2020-01-13 ENCOUNTER — Telehealth: Payer: Self-pay | Admitting: Emergency Medicine

## 2020-01-13 DIAGNOSIS — R0789 Other chest pain: Secondary | ICD-10-CM

## 2020-01-13 NOTE — Telephone Encounter (Signed)
Called and spoke with pt who stated she is still having pain in her chest. Pt stated after she had covid in January 2021, she developed pain in her chest and thought it was a heart attack so went to the ED to be evaluated. Pt stated that the pain is a constant pain that is sharp. Pt stated that Dr Lamonte Sakai told her to take an antacid x2 weeks to see if that would work and pt stated that the antacid has not helped at all.  Pt sent a mychart message to the office 4/5 after her last OV with Dr. Lamonte Sakai on 2/12 stating that she was still having the pains and was prescribed gabapentin and meloxicam by PCP which was giving her no relief.  Recommendations from Dr. Lamonte Sakai was if pt continued to have the chest discomfort to be seen in our office with a cxr to ensure there isn't a new finding to support a pulmonary cause.  Stated to pt the info stated by Dr. Lamonte Sakai in last mychart message and pt verbalized understanding. OV scheduled for pt and told her to arrive early for xray prior. Order placed for the xray. Nothing further needed.

## 2020-01-13 NOTE — Telephone Encounter (Signed)
A user error has taken place: closing encounter.Marland KitchenJohny Wolfe

## 2020-01-15 ENCOUNTER — Other Ambulatory Visit: Payer: Self-pay

## 2020-01-15 ENCOUNTER — Ambulatory Visit (INDEPENDENT_AMBULATORY_CARE_PROVIDER_SITE_OTHER): Payer: 59 | Admitting: Pulmonary Disease

## 2020-01-15 ENCOUNTER — Encounter: Payer: Self-pay | Admitting: Pulmonary Disease

## 2020-01-15 ENCOUNTER — Ambulatory Visit (INDEPENDENT_AMBULATORY_CARE_PROVIDER_SITE_OTHER): Payer: 59

## 2020-01-15 VITALS — BP 120/84 | HR 88 | Temp 98.4°F | Ht 66.0 in | Wt 211.8 lb

## 2020-01-15 DIAGNOSIS — R0789 Other chest pain: Secondary | ICD-10-CM

## 2020-01-15 DIAGNOSIS — Z8616 Personal history of COVID-19: Secondary | ICD-10-CM | POA: Diagnosis not present

## 2020-01-15 NOTE — Assessment & Plan Note (Signed)
Had Covid in January/2021 Was hospitalized Completed remdesivir Did not receive steroids  Plan: Continue to clinically monitor Referral to cardiology for atypical chest pain status post COVID-19 infection If patient shortness of breath worsens then she will need pulmonary function testing

## 2020-01-15 NOTE — Patient Instructions (Addendum)
You were seen today by Lauraine Rinne, NP  for:   1. Atypical chest pain s/p COVID19  - Ambulatory referral to Cardiology  You tolerated walk in office today without any oxygen desaturations' Chest x-ray today stable Notify our office if your shortness of breath starts to worsen we may need to obtain pulmonary function testing  I will refer you to cardiology for further evaluation of this atypical chest pain that you have developed ever since having Covid  We recommend today:  Orders Placed This Encounter  Procedures  . Ambulatory referral to Cardiology    Referral Priority:   Urgent    Referral Type:   Consultation    Referral Reason:   Specialty Services Required    Requested Specialty:   Cardiology    Number of Visits Requested:   1   Orders Placed This Encounter  Procedures  . Ambulatory referral to Cardiology   No orders of the defined types were placed in this encounter.   Follow Up:    Return in about 2 months (around 03/16/2020), or if symptoms worsen or fail to improve, for Follow up with Dr. Lamonte Sakai.   Please do your part to reduce the spread of COVID-19:      Reduce your risk of any infection  and COVID19 by using the similar precautions used for avoiding the common cold or flu:  Marland Kitchen Wash your hands often with soap and warm water for at least 20 seconds.  If soap and water are not readily available, use an alcohol-based hand sanitizer with at least 60% alcohol.  . If coughing or sneezing, cover your mouth and nose by coughing or sneezing into the elbow areas of your shirt or coat, into a tissue or into your sleeve (not your hands). Langley Gauss A MASK when in public  . Avoid shaking hands with others and consider head nods or verbal greetings only. . Avoid touching your eyes, nose, or mouth with unwashed hands.  . Avoid close contact with people who are sick. . Avoid places or events with large numbers of people in one location, like concerts or sporting events. . If you  have some symptoms but not all symptoms, continue to monitor at home and seek medical attention if your symptoms worsen. . If you are having a medical emergency, call 911.   [place] / e-Visit: [email or web address]         [facility]: [facility]: [phone]                   [facility]: [phone]     It is flu season:   >>> Best ways to protect herself from the flu: Receive the yearly flu vaccine, practice good hand hygiene washing with soap and also using hand sanitizer when available, eat a nutritious meals, get adequate rest, hydrate appropriately   Please contact the office if your symptoms worsen or you have concerns that you are not improving.   Thank you for choosing [facility] for your healthcare, and for allowing [name] to partner with you on your healthcare journey. I am thankful to be able to provide care to you today.   Wyn Quaker FNP-C

## 2020-01-15 NOTE — Progress Notes (Signed)
@Patient  ID: Lauren Wolfe, female    DOB: 04-26-1976, 44 y.o.   MRN: [id number]  Chief Complaint  Patient presents with  . Follow-up    F/U on chest pain from covid. Has had the chest pain since January 2021. Described as a sharp pain.     Referring provider: Biagio Borg, MD  HPI:  44 year old female followed in our office for right upper extremity DVT and chest discomfort.  Patient status post Covid in January/2021  PMH: Anxiety, depression, hyperlipidemia, migraine, insomnia Smoker/ Smoking History: Never smoker Maintenance:   Pt of: Dr. Lamonte Sakai  01/15/2020  - Visit   43 year old female never smoker status post Covid infection in January/2021.  Patient was last seen in February/2021 by Dr. Lamonte Sakai.  At that time patient was recovering from COVID-19 virus.  Still having some residual fatigue as well as exertional dyspnea.  Unsure if atypical chest pain is directly related to symptoms of long Covid as well.  If her shortness of breath persists after 3 to 4 months after recovery from COVID-19 then we should prefer pursue pulmonary function testing.  44 year old female never smoker presenting to office today for physical evaluation.  Patient had a chest x-ray prior to exam.  Chest x-ray shows no acute cardiopulmonary concern.  Mild left base atelectasis.  Patient reporting that she continues to have ongoing atypical chest pain which has been present since contracting Covid in January/2021.  She reports that there is no pattern to the chest pain.  Will intermittently happen.  She reports that has not happened today.  But happened multiple times throughout the day yesterday and the day before where she had a dull ache.  Vital signs today are stable.  Patient tried PPI therapy recommended at last visit with Dr. Lamonte Sakai did not find this helpful.  Patient has not been evaluated by cardiology.  Patient is also not had an echocardiogram.  Patient feels that her breathing is slowly improving as  she recovers from Covid.  Questionaires / Pulmonary Flowsheets:   MMRC: mMRC Dyspnea Scale mMRC Score  01/15/2020 2    Tests:   05/17/2019-CT chest-mild right middle lobe scarring versus subsegmental atelectasis, no evidence of pulmonary neoplasm, mild right paratracheal mediastinal lymphadenopathy which is nonspecific, I recommended continued follow-up by chest CT in 6 months  10/18/2019-CTA chest-no evidence of acute PE, multifocal areas of mixed groundglass and consolidative airspace opacity throughout both lungs findings are nonspecific but can be seen with atypical infection, borderline enlarged low-attenuation mediastinal and hilar nodes are likely reactive  11/11/2019-CTA chest-no evidence of PE, no active cardiopulmonary disease, right middle lobe, lingular and left lower lobe atelectasis  02/20/2017-ultrasound venous upper extremity-DVT with right basilic vein  FENO:  No results found for: NITRICOXIDE  PFT: No flowsheet data found.  WALK:  SIX MIN WALK 01/15/2020  Supplimental Oxygen during Test? (L/min) No  Tech Comments: Patient was able to complete 2 laps at a steady pace. Denied any SOB or chest pain. No O2 needed during or after walk.    Imaging: DG Chest 2 View  Result Date: 01/15/2020 CLINICAL DATA:  Chest discomfort EXAM: CHEST - 2 VIEW COMPARISON:  November 11, 2019 FINDINGS: There is slight left base atelectasis. Lungs elsewhere are clear. Heart size and pulmonary vascularity are normal. No adenopathy. No pneumothorax. No bone lesions. IMPRESSION: Mild left base atelectasis. Lungs elsewhere clear. Cardiac silhouette within normal limits. Electronically Signed   By: Lowella Grip III M.D.   On: 01/15/2020 16:42  Lab Results:  CBC    Component Value Date/Time   WBC 6.2 12/17/2019 0926   RBC 4.83 12/17/2019 0926   HGB 14.6 12/17/2019 0926   HCT 43.6 12/17/2019 0926   PLT 269.0 12/17/2019 0926   MCV 90.3 12/17/2019 0926   MCH 29.8 11/11/2019 2237   MCHC  33.5 12/17/2019 0926   RDW 14.5 12/17/2019 0926   LYMPHSABS 2.3 12/17/2019 0926   MONOABS 0.5 12/17/2019 0926   EOSABS 0.1 12/17/2019 0926   BASOSABS 0.0 12/17/2019 0926    BMET    Component Value Date/Time   NA 138 12/17/2019 0926   K 3.9 12/17/2019 0926   CL 102 12/17/2019 0926   CO2 29 12/17/2019 0926   GLUCOSE 89 12/17/2019 0926   BUN 13 12/17/2019 0926   CREATININE 0.84 12/17/2019 0926   CALCIUM 9.3 12/17/2019 0926   GFRNONAA >60 11/11/2019 2237   GFRAA >60 11/11/2019 2237    BNP No results found for: BNP  ProBNP No results found for: PROBNP  Specialty Problems      Pulmonary Problems   Pneumonia due to COVID-19 virus   SOB (shortness of breath)   Cough      Allergies  Allergen Reactions  . Azithromycin Shortness Of Breath, Rash and Other (See Comments)    Prurutis  . Codeine Shortness Of Breath, Itching and Rash  . Hydrocodone Shortness Of Breath, Itching and Rash  . Ketorolac Tromethamine Shortness Of Breath, Itching and Rash  . Prednisone Shortness Of Breath  . Tramadol Itching    Immunization History  Administered Date(s) Administered  . Influenza Whole 07/21/2010  . Influenza,inj,Quad PF,6+ Mos 08/21/2018  . Influenza-Unspecified 07/05/2017, 08/19/2018  . PFIZER SARS-COV-2 Vaccination 12/10/2019  . Td 07/21/2010    Past Medical History:  Diagnosis Date  . Anxiety   . Arm vein blood clot, right   . Knee pain, left 01/2019  . Migraine   . Wears glasses    and contacts    Tobacco History: Social History   Tobacco Use  Smoking Status Never Smoker  Smokeless Tobacco Never Used   Counseling given: Yes   Continue to not smoke  Outpatient Encounter Medications as of 01/15/2020  Medication Sig  . ascorbic acid (VITAMIN C) 500 MG tablet Take 1 tablet (500 mg total) by mouth daily.  Marland Kitchen aspirin EC 81 MG tablet Take 81 mg by mouth daily.  . clobetasol cream (TEMOVATE) 6.27 % Apply 1 application topically 2 (two) times daily.  .  cyclobenzaprine (FLEXERIL) 10 MG tablet Take 1 tablet (10 mg total) by mouth 2 (two) times daily as needed for muscle spasms.  . Diclofenac Sodium (PENNSAID) 2 % SOLN Place 2 g onto the skin 2 (two) times daily. (Patient taking differently: Place 2 g onto the skin 2 (two) times daily as needed (For knee pain.). )  . Fremanezumab-vfrm (AJOVY) 225 MG/1.5ML SOSY Inject 225 mg into the skin every 30 (thirty) days.  Marland Kitchen HYDROcodone-homatropine (HYCODAN) 5-1.5 MG/5ML syrup Take 5 mLs by mouth every 6 (six) hours as needed for cough.  Marland Kitchen levonorgestrel (MIRENA) 20 MCG/24HR IUD 1 each by Intrauterine route once.  . lidocaine (LIDODERM) 5 % Place 1 patch onto the skin daily. Remove & Discard patch within 12 hours or as directed by MD  . meloxicam (MOBIC) 15 MG tablet Take 1 tablet (15 mg total) by mouth daily as needed for pain.  . methocarbamol (ROBAXIN) 750 MG tablet Take 1 tablet (750 mg total) by mouth 3 (three)  times daily as needed (muscle spasm/pain). (Patient taking differently: Take 750 mg by mouth 3 (three) times daily as needed (For migraines.). )  . ondansetron (ZOFRAN) 4 MG tablet Take 1 tablet (4 mg total) by mouth every 8 (eight) hours as needed for nausea or vomiting.  . ondansetron (ZOFRAN-ODT) 8 MG disintegrating tablet Take 1 tablet (8 mg total) by mouth every 8 (eight) hours as needed for nausea or vomiting.  . Rimegepant Sulfate (NURTEC) 75 MG TBDP Take 75 mg by mouth as needed (take 1 tablet for acute headache, max is 75 mg/24 hours).  . SUMAtriptan (IMITREX) 6 MG/0.5ML SOLN injection One injection at onset of migraine.  May repeat in 2 hrs, if needed.  Max dose: 2 inj/day. This is a 30 day prescription. (Patient taking differently: Inject 6 mg into the skin every 2 (two) hours as needed for migraine (One injection at onset.  May repeat in 2 hrs, if needed.  Max dose: 2 inj/day.). )  . tiZANidine (ZANAFLEX) 4 MG tablet TAKE ONE TABLET BY MOUTH THREE TIMES A DAY AS NEEDED FOR MIGRAINES FOR 30  DAYS (Patient taking differently: Take 4 mg by mouth every 8 (eight) hours as needed (For migraines.). )  . triamcinolone cream (KENALOG) 0.1 % APPLY TO AFFECTED AREA TWICE A DAY  . Vitamin D, Ergocalciferol, (DRISDOL) 1.25 MG (50000 UNIT) CAPS capsule Take 1 capsule (50,000 Units total) by mouth every 7 (seven) days.  Marland Kitchen zinc sulfate 220 (50 Zn) MG capsule Take 1 capsule (220 mg total) by mouth daily.   No facility-administered encounter medications on file as of 01/15/2020.     Review of Systems  Review of Systems  Constitutional: Negative for activity change, fatigue and fever.  HENT: Negative for sinus pressure, sinus pain and sore throat.   Respiratory: Positive for shortness of breath (feels its improving ). Negative for cough and wheezing.   Cardiovascular: Positive for chest pain. Negative for palpitations.  Gastrointestinal: Negative for diarrhea, nausea and vomiting.  Musculoskeletal: Negative for arthralgias.  Neurological: Negative for dizziness.  Psychiatric/Behavioral: Negative for sleep disturbance. The patient is not nervous/anxious.      Physical Exam  BP 120/84 (BP Location: Left Arm, Patient Position: Sitting, Cuff Size: Large)   Pulse 88   Temp 98.4 F (36.9 C) (Temporal)   Ht 5' 6"  (1.676 m)   Wt 211 lb 12.8 oz (96.1 kg)   SpO2 100% Comment: on RA  BMI 34.19 kg/m   Wt Readings from Last 5 Encounters:  01/15/20 211 lb 12.8 oz (96.1 kg)  12/17/19 216 lb (98 kg)  11/14/19 216 lb 3.2 oz (98.1 kg)  11/11/19 215 lb (97.5 kg)  11/06/19 214 lb (97.1 kg)    BMI Readings from Last 5 Encounters:  01/15/20 34.19 kg/m  12/17/19 34.86 kg/m  11/14/19 34.90 kg/m  11/11/19 34.70 kg/m  11/06/19 34.54 kg/m    Physical Exam Vitals and nursing note reviewed.  Constitutional:      General: She is not in acute distress.    Appearance: Normal appearance. She is normal weight.  HENT:     Head: Normocephalic and atraumatic.     Right Ear: External ear normal.      Left Ear: External ear normal.     Nose: Nose normal.     Mouth/Throat:     Mouth: Mucous membranes are moist.     Pharynx: Oropharynx is clear.  Eyes:     Pupils: Pupils are equal, round, and reactive to light.  Cardiovascular:     Rate and Rhythm: Normal rate and regular rhythm.     Pulses: Normal pulses.     Heart sounds: Normal heart sounds. No murmur.  Pulmonary:     Effort: Pulmonary effort is normal. No respiratory distress.     Breath sounds: Normal breath sounds. No decreased air movement. No decreased breath sounds, wheezing or rales.  Musculoskeletal:     Cervical back: Normal range of motion.  Skin:    General: Skin is warm and dry.     Capillary Refill: Capillary refill takes less than 2 seconds.  Neurological:     General: No focal deficit present.     Mental Status: She is alert and oriented to person, place, and time. Mental status is at baseline.     Gait: Gait normal.  Psychiatric:        Mood and Affect: Mood normal.        Behavior: Behavior normal.        Thought Content: Thought content normal.        Judgment: Judgment normal.     Assessment & Plan:   Atypical chest pain S/p Covid19 pneumonia  Atypical chest pain since covid  #intermittant, no pattern #at rest and with exertion  No cards eval  No echo  Stable walk on exam today Trial of anti-inflammatories from primary care did not improve Trial of PPI from this office did not improve Chest x-ray today clear  Plan: Referral to cardiology for further evaluation of atypical chest pain Explained to patient that the symptoms may be symptoms of long Covid Patient may benefit from an echocardiogram    History of COVID-19 Had Covid in January/2021 Was hospitalized Completed remdesivir Did not receive steroids  Plan: Continue to clinically monitor Referral to cardiology for atypical chest pain status post COVID-19 infection If patient shortness of breath worsens then she will need  pulmonary function testing    Return in about 2 months (around 03/16/2020), or if symptoms worsen or fail to improve, for Follow up with Dr. Lamonte Sakai.   Lauraine Rinne, NP 01/15/2020   This appointment required 28 minutes of patient care (this includes precharting, chart review, review of results, face-to-face care, etc.).

## 2020-01-15 NOTE — Assessment & Plan Note (Signed)
S/p Covid19 pneumonia  Atypical chest pain since covid  #intermittant, no pattern #at rest and with exertion  No cards eval  No echo  Stable walk on exam today Trial of anti-inflammatories from primary care did not improve Trial of PPI from this office did not improve Chest x-ray today clear  Plan: Referral to cardiology for further evaluation of atypical chest pain Explained to patient that the symptoms may be symptoms of long Covid Patient may benefit from an echocardiogram

## 2020-01-19 ENCOUNTER — Encounter: Payer: Self-pay | Admitting: Cardiovascular Disease

## 2020-01-19 ENCOUNTER — Ambulatory Visit (INDEPENDENT_AMBULATORY_CARE_PROVIDER_SITE_OTHER): Payer: 59 | Admitting: Cardiovascular Disease

## 2020-01-19 ENCOUNTER — Other Ambulatory Visit: Payer: Self-pay

## 2020-01-19 DIAGNOSIS — IMO0002 Reserved for concepts with insufficient information to code with codable children: Secondary | ICD-10-CM

## 2020-01-19 DIAGNOSIS — E781 Pure hyperglyceridemia: Secondary | ICD-10-CM | POA: Diagnosis not present

## 2020-01-19 DIAGNOSIS — R5383 Other fatigue: Secondary | ICD-10-CM

## 2020-01-19 DIAGNOSIS — G43709 Chronic migraine without aura, not intractable, without status migrainosus: Secondary | ICD-10-CM | POA: Diagnosis not present

## 2020-01-19 DIAGNOSIS — E669 Obesity, unspecified: Secondary | ICD-10-CM | POA: Diagnosis not present

## 2020-01-19 DIAGNOSIS — R0789 Other chest pain: Secondary | ICD-10-CM | POA: Diagnosis not present

## 2020-01-19 DIAGNOSIS — Z8616 Personal history of COVID-19: Secondary | ICD-10-CM

## 2020-01-19 NOTE — Progress Notes (Signed)
Cardiology Office Note    Date:  01/26/2020   ID:  PENNY ARRAMBIDE, DOB May 23, 1976, MRN [id number]  PCP:  Biagio Borg, MD  Cardiologist:  Shelva Majestic, MD   Chief Complaint  Patient presents with  . New Patient (Initial Visit)  . Chest Pain   New patient cardiology referral through the courtesy of Dr. Lamonte Sakai for evaluation of chest pain.  History of Present Illness:  Lauren Wolfe is a 44 y.o. female who is followed by Dr. Dan Maker for primary care.  He was diagnosed with COVID-19 pneumonia in January 2021.  Symptoms were significant with fever to 104 and marked fatigability.  Subsequently she has developed chest pain symptomatology.  Her chest pain does not have aches any exertional component to it.  Can happen anytime throughout the day typically described as sharp, at times continuous, can last for days.  It is consistently been nonexertional.  She experiences some occasional shortness of breath with exertion which has slightly improved.  Because of her recurrent chest pain symptomatology she is referred for cardiac evaluation.  Her history is notable for remote right upper extremity DVT.  She has a history of migraine headaches and now sees Dr. Krista Blue of [facility] neurology.  She has been treated with meloxicam and gabapentin.  She continues to experience fatigue.  She has a history of obesity but admits to an 8 pound weight loss over the last month.  She presents for initial evaluation.  Past Medical History:  Diagnosis Date  . Anxiety   . Arm vein blood clot, right   . Knee pain, left 01/2019  . Migraine   . Wears glasses    and contacts    Past Surgical History:  Procedure Laterality Date  . BREAST BIOPSY  10/31/2012   Procedure: BREAST BIOPSY WITH NEEDLE LOCALIZATION;  Surgeon: Rolm Bookbinder, MD;  Location: [facility];  Service: General;  Laterality: Right;  right breast wire guided excisional biopsy  . BREAST EXCISIONAL BIOPSY Right 2014  .  ganglion cysts  2011  . WRIST SURGERY Left     Current Medications: Outpatient Medications Prior to Visit  Medication Sig Dispense Refill  . clobetasol cream (TEMOVATE) 3.00 % Apply 1 application topically 2 (two) times daily. 30 g 2  . Diclofenac Sodium (PENNSAID) 2 % SOLN Place 2 g onto the skin 2 (two) times daily. (Patient taking differently: Place 2 g onto the skin 2 (two) times daily as needed (For knee pain.). ) 112 g 3  . levonorgestrel (MIRENA) 20 MCG/24HR IUD 1 each by Intrauterine route once.    . ondansetron (ZOFRAN) 4 MG tablet Take 1 tablet (4 mg total) by mouth every 8 (eight) hours as needed for nausea or vomiting. 20 tablet 1  . Rimegepant Sulfate (NURTEC) 75 MG TBDP Take 75 mg by mouth as needed (take 1 tablet for acute headache, max is 75 mg/24 hours). 8 tablet 11  . SUMAtriptan (IMITREX) 6 MG/0.5ML SOLN injection One injection at onset of migraine.  May repeat in 2 hrs, if needed.  Max dose: 2 inj/day. This is a 30 day prescription. (Patient taking differently: Inject 6 mg into the skin every 2 (two) hours as needed for migraine (One injection at onset.  May repeat in 2 hrs, if needed.  Max dose: 2 inj/day.). ) 5 mL 11  . tiZANidine (ZANAFLEX) 4 MG tablet TAKE ONE TABLET BY MOUTH THREE TIMES A DAY AS NEEDED FOR MIGRAINES FOR 30  DAYS (Patient taking differently: Take 4 mg by mouth every 8 (eight) hours as needed (For migraines.). ) 30 tablet 6  . triamcinolone cream (KENALOG) 0.1 % APPLY TO AFFECTED AREA TWICE A DAY 30 g 2  . Vitamin D, Ergocalciferol, (DRISDOL) 1.25 MG (50000 UNIT) CAPS capsule Take 1 capsule (50,000 Units total) by mouth every 7 (seven) days. 4 capsule 2  . ascorbic acid (VITAMIN C) 500 MG tablet Take 1 tablet (500 mg total) by mouth daily. 10 tablet 0  . aspirin EC 81 MG tablet Take 81 mg by mouth daily.    . cyclobenzaprine (FLEXERIL) 10 MG tablet Take 1 tablet (10 mg total) by mouth 2 (two) times daily as needed for muscle spasms. 20 tablet 0  .  Fremanezumab-vfrm (AJOVY) 225 MG/1.5ML SOSY Inject 225 mg into the skin every 30 (thirty) days. 1.5 mL 11  . HYDROcodone-homatropine (HYCODAN) 5-1.5 MG/5ML syrup Take 5 mLs by mouth every 6 (six) hours as needed for cough. 240 mL 0  . lidocaine (LIDODERM) 5 % Place 1 patch onto the skin daily. Remove & Discard patch within 12 hours or as directed by MD 30 patch 0  . meloxicam (MOBIC) 15 MG tablet Take 1 tablet (15 mg total) by mouth daily as needed for pain. 90 tablet 1  . methocarbamol (ROBAXIN) 750 MG tablet Take 1 tablet (750 mg total) by mouth 3 (three) times daily as needed (muscle spasm/pain). (Patient taking differently: Take 750 mg by mouth 3 (three) times daily as needed (For migraines.). ) 15 tablet 6  . ondansetron (ZOFRAN-ODT) 8 MG disintegrating tablet Take 1 tablet (8 mg total) by mouth every 8 (eight) hours as needed for nausea or vomiting. 20 tablet 6  . zinc sulfate 220 (50 Zn) MG capsule Take 1 capsule (220 mg total) by mouth daily. 10 capsule 0   No facility-administered medications prior to visit.     Allergies:   Azithromycin, Codeine, Hydrocodone, Ketorolac tromethamine, Prednisone, and Tramadol   Social History   Socioeconomic History  . Marital status: Single    Spouse name: Not on file  . Number of children: 1  . Years of education: some college  . Highest education level: Not on file  Occupational History  . 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 Smoking status: Never Smoker  . Smokeless tobacco: Never Used  Substance and Sexual Activity  . Alcohol use: No  . Drug use: No  . Sexual activity: Yes  Other Topics Concern  . Not on file  Social History Narrative   Lives at home with her daughter.   Right-handed.   No caffeine use.   Social Determinants of Health   Financial Resource Strain:   . Difficulty of Paying Living Expenses:   Food Insecurity:   . Worried About Charity fundraiser in the Last Year:   . Arboriculturist in the Last  Year:   Transportation Needs:   . Film/video editor (Medical):   Marland Kitchen Lack of Transportation (Non-Medical):   Physical Activity:   . Days of Exercise per Week:   . Minutes of Exercise per Session:   Stress:   . Feeling of Stress :   Social Connections:   . Frequency of Communication with Friends and Family:   . Frequency of Social Gatherings with Friends and Family:   . Attends Religious Services:   . Active Member of Clubs or Organizations:   . Attends Archivist Meetings:   .  Marital Status:     Additional social history is notable that she is single and has 1 child a daughter age 71.  There is no history of tobacco use.  She attended [employer].  She currently works at [employer] as an Web designer.  Family History:  The patient's family history includes Aneurysm in her mother; Breast cancer in her cousin, maternal grandmother, and another family member; Diabetes in an other family member; Gout in an other family member; Hypertension in her mother and another family member; Migraines in her paternal aunt; Other in an other family member; Stomach cancer in her father.  Her mother died at age 80 and hypertension unfortunately died secondary to a car accident.  Her father is living at age 16 and has stage IV colon cancer.  ROS General: Negative; No fevers, chills, or night sweats; positive fatigability HEENT: Negative; No changes in vision or hearing, sinus congestion, difficulty swallowing Pulmonary: Covid pneumonia completed a course of remdesivir in January 2021 Cardiovascular: See HPI GI: Negative; No nausea, vomiting, diarrhea, or abdominal pain GU: Negative; No dysuria, hematuria, or difficulty voiding Musculoskeletal: Negative; no myalgias, joint pain, or weakness Hematologic/Oncology: Negative; no easy bruising, bleeding Endocrine: Negative; no heat/cold intolerance; no diabetes Neuro: Positive for migraine  headaches Skin: Negative; No rashes or skin lesions Psychiatric: Negative; No behavioral problems, depression Sleep: Negative; No snoring, daytime sleepiness, hypersomnolence, bruxism, restless legs, hypnogognic hallucinations, no cataplexy Other comprehensive 14 point system review is negative.   PHYSICAL EXAM:   VS:  BP 126/88 (BP Location: Left Arm, Patient Position: Sitting, Cuff Size: Large)   Pulse 67   Temp (!) 97.1 F (36.2 C)   Ht 5' 6"  (1.676 m)   Wt 210 lb (95.3 kg)   BMI 33.89 kg/m     Repeat blood pressure by me 122/80  Wt Readings from Last 3 Encounters:  01/19/20 210 lb (95.3 kg)  01/15/20 211 lb 12.8 oz (96.1 kg)  12/17/19 216 lb (98 kg)    General: Alert, oriented, no distress.  Skin: normal turgor, no rashes, warm and dry HEENT: Normocephalic, atraumatic. Pupils equal round and reactive to light; sclera anicteric; extraocular muscles intact;  Nose without nasal septal hypertrophy Mouth/Parynx benign; Mallinpatti scale 3 Neck: No JVD, no carotid bruits; normal carotid upstroke Lungs: clear to ausculatation and percussion; no wheezing or rales Chest wall: without tenderness to palpitation Heart: PMI not displaced, RRR, s1 s2 normal, 1/6 systolic murmur, no diastolic murmur, no rubs, gallops, thrills, or heaves Abdomen: soft, nontender; no hepatosplenomehaly, BS+; abdominal aorta nontender and not dilated by palpation. Back: no CVA tenderness Pulses 2+ Musculoskeletal: full range of motion, normal strength, no joint deformities Extremities: no clubbing cyanosis or edema, Homan's sign negative  Neurologic: grossly nonfocal; Cranial nerves grossly wnl Psychologic: Normal mood and affect   Studies/Labs Reviewed:   EKG:  EKG is ordered today.  ECG (independently read by me): Normal sinus rhythm at a 67 bpm.  Nondiagnostic T wave abnormality in lead III, V1 and V2.  Recent Labs: BMP Latest Ref Rng & Units 12/17/2019 11/11/2019 11/11/2019  Glucose 70 - 99 mg/dL 89  86 100(H)  BUN 6 - 23 mg/dL 13 7 12   Creatinine 0.40 - 1.20 mg/dL 0.84 0.84 0.87  Sodium 135 - 145 mEq/L 138 139 138  Potassium 3.5 - 5.1 mEq/L 3.9 3.5 3.8  Chloride 96 - 112 mEq/L 102 104 107  CO2 19 - 32 mEq/L 29 24 23   Calcium 8.4 -  10.5 mg/dL 9.3 9.6 9.0     Hepatic Function Latest Ref Rng & Units 12/17/2019 10/18/2019 10/21/2018  Total Protein 6.0 - 8.3 g/dL 7.3 7.5 7.3  Albumin 3.5 - 5.2 g/dL 4.2 3.4(L) 4.3  AST 0 - 37 U/L 15 36 17  ALT 0 - 35 U/L 18 37 20  Alk Phosphatase 39 - 117 U/L 66 54 77  Total Bilirubin 0.2 - 1.2 mg/dL 0.4 0.3 0.3  Bilirubin, Direct 0.0 - 0.3 mg/dL 0.1 - 0.1    CBC Latest Ref Rng & Units 12/17/2019 11/11/2019 11/11/2019  WBC 4.0 - 10.5 K/uL 6.2 6.0 6.5  Hemoglobin 12.0 - 15.0 g/dL 14.6 14.8 13.8  Hematocrit 36.0 - 46.0 % 43.6 45.6 42.5  Platelets 150.0 - 400.0 K/uL 269.0 266 231   Lab Results  Component Value Date   MCV 90.3 12/17/2019   MCV 91.8 11/11/2019   MCV 92.4 11/11/2019   Lab Results  Component Value Date   TSH 1.97 12/17/2019   No results found for: HGBA1C   BNP No results found for: BNP  ProBNP No results found for: PROBNP   Lipid Panel     Component Value Date/Time   CHOL 234 (H) 12/17/2019 0926   TRIG 114.0 12/17/2019 0926   HDL 60.60 12/17/2019 0926   CHOLHDL 4 12/17/2019 0926   VLDL 22.8 12/17/2019 0926   LDLCALC 150 (H) 12/17/2019 0926   LDLDIRECT 164.5 09/19/2012 1052     RADIOLOGY: DG Chest 2 View  Result Date: 01/15/2020 CLINICAL DATA:  Chest discomfort EXAM: CHEST - 2 VIEW COMPARISON:  November 11, 2019 FINDINGS: There is slight left base atelectasis. Lungs elsewhere are clear. Heart size and pulmonary vascularity are normal. No adenopathy. No pneumothorax. No bone lesions. IMPRESSION: Mild left base atelectasis. Lungs elsewhere clear. Cardiac silhouette within normal limits. Electronically Signed   By: Lowella Grip III M.D.   On: 01/15/2020 16:42     Additional studies/ records that were reviewed today  include:  I reviewed the patient's records from of our pulmonary as well as her hospitalization    ASSESSMENT:    1. Atypical chest pain   2. Pure hypertriglyceridemia   3. Chronic migraine   4. Obesity (BMI 30.0-34.9)   5. History of COVID-19   6. Fatigue, unspecified type    PLAN:  Lauren Wolfe is a 44 year old African-American female who has a remote history for right upper extremity DVT and developed Covid in January 2021 associated with fevers up to 104 cough and marked fatigability.  She completed a full course of remdesivir.  Subsequent chest x-rays have not shown acute abnormality and repeat CT imaging in February 2021 did not show evidence for pulmonary embolism and showed mild right middle lobe, lingular and left lower lobe atelectasis which was improved from January.  She has had recurrent episodes of nonexertional chest pain which initiated 2 weeks after her hospitalization.  Her pain is described as sharp, at times continuous, at times can last for days, and is characteristically nonexertional.  I suspect her chest pain is not of ischemic etiology and by clinical description seems more musculoskeletal in etiology.  Her blood pressure today is stable she is not on any medication for hypertension.  At times she admits to some exertional shortness of breath.  I am recommending she undergo an echo Doppler study to assess systolic and diastolic function as well as wall motion.  View of laboratory from March demonstrates significant hyperlipidemia with total cholesterol 234 LDL  cholesterol 150.  Triglycerides were normal at 114 and HDL was excellent at 60.6.  She had normal renal function.  She has tried to adjust her diet over the last month and has lost approximately 8 pounds.  She continues to be mildly obese with a BMI of 33.89.  We discussed healthy cardiovascular diet.  She will treated to lose weight.  I am recommending follow-up chemistry and lipid panel in 2 to 3 months.  Her  migraine headaches are now followed by Dr. Krista Blue of [facility] neurology and she is on meloxicam and gabapentin.  I will see her in 3 months for follow-up evaluation or sooner as necessary.   Medication Adjustments/Labs and Tests Ordered: Current medicines are reviewed at length with the patient today.  Concerns regarding medicines are outlined above.  Medication changes, Labs and Tests ordered today are listed in the Patient Instructions below. Patient Instructions  Medication Instructions:  CONTINUE WITH CURRENT MEDICATIONS. NO CHANGES.  *If you need a refill on your cardiac medications before your next appointment, please call your pharmacy*   Lab Work: 2 MONTHS: FASTING LABS: CMET LIPID  If you have labs (blood work) drawn today and your tests are completely normal, you will receive your results only by: Marland Kitchen MyChart Message (if you have MyChart) OR . A paper copy in the mail If you have any lab test that is abnormal or we need to change your treatment, we will call you to review the results.   Testing/Procedures: Your physician has requested that you have an echocardiogram. Echocardiography is a painless test that uses sound waves to create images of your heart. It provides your doctor with information about the size and shape of your heart and how well your heart's chambers and valves are working. This procedure takes approximately one hour. There are no restrictions for this procedure.  [place]   Follow-Up: At [facility], you and your health needs are our priority.  As part of our continuing mission to provide you with exceptional heart care, we have created designated Provider Care Teams.  These Care Teams include your primary Cardiologist (physician) and Advanced Practice Providers (APPs -  Physician Assistants and Nurse Practitioners) who all work together to provide you with the care you need, when you need it.  We recommend signing up for the patient portal  called "MyChart".  Sign up information is provided on this After Visit Summary.  MyChart is used to connect with patients for Virtual Visits (Telemedicine).  Patients are able to view lab/test results, encounter notes, upcoming appointments, etc.  Non-urgent messages can be sent to your provider as well.   To learn more about what you can do with MyChart, go to [email or web address].    Your next appointment:   2 month(s)  The format for your next appointment:   In Person  Provider:   Shelva Majestic, MD       Signed, Shelva Majestic, MD  01/26/2020 6:18 PM    [facility] [address] [place], [place], [place]  [address] Phone: [phone]

## 2020-01-19 NOTE — Patient Instructions (Signed)
Medication Instructions:  CONTINUE WITH CURRENT MEDICATIONS. NO CHANGES.  *If you need a refill on your cardiac medications before your next appointment, please call your pharmacy*   Lab Work: 2 MONTHS: FASTING LABS: CMET LIPID  If you have labs (blood work) drawn today and your tests are completely normal, you will receive your results only by: Marland Kitchen MyChart Message (if you have MyChart) OR . A paper copy in the mail If you have any lab test that is abnormal or we need to change your treatment, we will call you to review the results.   Testing/Procedures: Your physician has requested that you have an echocardiogram. Echocardiography is a painless test that uses sound waves to create images of your heart. It provides your doctor with information about the size and shape of your heart and how well your heart's chambers and valves are working. This procedure takes approximately one hour. There are no restrictions for this procedure.  [employer]   Follow-Up: At [facility], you and your health needs are our priority.  As part of our continuing mission to provide you with exceptional heart care, we have created designated Provider Care Teams.  These Care Teams include your primary Cardiologist (physician) and Advanced Practice Providers (APPs -  Physician Assistants and Nurse Practitioners) who all work together to provide you with the care you need, when you need it.  We recommend signing up for the patient portal called "MyChart".  Sign up information is provided on this After Visit Summary.  MyChart is used to connect with patients for Virtual Visits (Telemedicine).  Patients are able to view lab/test results, encounter notes, upcoming appointments, etc.  Non-urgent messages can be sent to your provider as well.   To learn more about what you can do with MyChart, go to [email or web address].    Your next appointment:   2 month(s)  The format for your next appointment:   In  Person  Provider:   Shelva Majestic, MD

## 2020-01-26 ENCOUNTER — Encounter: Payer: Self-pay | Admitting: Cardiovascular Disease

## 2020-01-29 ENCOUNTER — Ambulatory Visit: Payer: 59 | Admitting: Emergency Medicine

## 2020-02-05 ENCOUNTER — Other Ambulatory Visit: Payer: Self-pay

## 2020-02-05 ENCOUNTER — Ambulatory Visit (HOSPITAL_COMMUNITY): Payer: 59 | Attending: Cardiovascular Disease

## 2020-02-05 DIAGNOSIS — R0789 Other chest pain: Secondary | ICD-10-CM | POA: Diagnosis present

## 2020-02-11 ENCOUNTER — Telehealth: Payer: Self-pay | Admitting: Cardiovascular Disease

## 2020-02-11 NOTE — Telephone Encounter (Signed)
Reviewed echo results with pt who is concerned and would like for Dr.Kelly to explain the results better to her. No other questions from pt at this time. Will send to [facility] for review.

## 2020-02-11 NOTE — Telephone Encounter (Signed)
° °  Pt would like to speak with Dr. Evette Georges nurse regarding echo results. She said she has some questions

## 2020-02-19 ENCOUNTER — Other Ambulatory Visit: Payer: Self-pay

## 2020-02-19 ENCOUNTER — Encounter: Payer: Self-pay | Admitting: Cardiovascular Disease

## 2020-02-19 ENCOUNTER — Ambulatory Visit (INDEPENDENT_AMBULATORY_CARE_PROVIDER_SITE_OTHER): Payer: 59 | Admitting: Cardiovascular Disease

## 2020-02-19 VITALS — BP 136/90 | HR 81 | Ht 66.0 in | Wt 206.4 lb

## 2020-02-19 DIAGNOSIS — E669 Obesity, unspecified: Secondary | ICD-10-CM

## 2020-02-19 DIAGNOSIS — Z8616 Personal history of COVID-19: Secondary | ICD-10-CM

## 2020-02-19 DIAGNOSIS — E7800 Pure hypercholesterolemia, unspecified: Secondary | ICD-10-CM | POA: Diagnosis not present

## 2020-02-19 DIAGNOSIS — R0789 Other chest pain: Secondary | ICD-10-CM | POA: Diagnosis not present

## 2020-02-19 DIAGNOSIS — G43709 Chronic migraine without aura, not intractable, without status migrainosus: Secondary | ICD-10-CM

## 2020-02-19 DIAGNOSIS — IMO0002 Reserved for concepts with insufficient information to code with codable children: Secondary | ICD-10-CM

## 2020-02-19 NOTE — Patient Instructions (Signed)
    Lab Work: GET BLOOD WORK IN ONE MONTH    Follow-Up: At [facility], you and your health needs are our priority.  As part of our continuing mission to provide you with exceptional heart care, we have created designated Provider Care Teams.  These Care Teams include your primary Cardiologist (physician) and Advanced Practice Providers (APPs -  Physician Assistants and Nurse Practitioners) who all work together to provide you with the care you need, when you need it.  We recommend signing up for the patient portal called "MyChart".  Sign up information is provided on this After Visit Summary.  MyChart is used to connect with patients for Virtual Visits (Telemedicine).  Patients are able to view lab/test results, encounter notes, upcoming appointments, etc.  Non-urgent messages can be sent to your provider as well.   To learn more about what you can do with MyChart, go to [email or web address].    Your next appointment:   6 month(s)  The format for your next appointment:   In Person  Provider:   Shelva Majestic, MD

## 2020-02-21 ENCOUNTER — Encounter: Payer: Self-pay | Admitting: Cardiovascular Disease

## 2020-02-21 NOTE — Progress Notes (Signed)
Cardiology Office Note    Date:  02/21/2020   ID:  SAMYIAH HALVORSEN, DOB [birth year], MRN [id number]  PCP:  Biagio Borg, MD  Cardiologist:  Shelva Majestic, MD   No chief complaint on file.  F/U cardiology evaluation initially referred through the courtesy of Dr. Lamonte Sakai for evaluation of chest pain.  History of Present Illness:  Erandy ZANAYAH SHADOWENS is a 44 y.o. female who is followed by Dr. Dan Maker for primary care.  She was diagnosed with COVID-19 pneumonia in January 2021.  Symptoms were significant with fever to 104 and marked fatigability.  Subsequently she has developed chest pain symptomatology.  Her chest pain does not have aches any exertional component to it.  Can happen anytime throughout the day typically described as sharp, at times continuous, can last for days.  It is consistently been nonexertional.  She experiences some occasional shortness of breath with exertion which has slightly improved.  Because of her recurrent chest pain symptomatology she is referred for cardiac evaluation.  Her history is notable for remote right upper extremity DVT.  She has a history of migraine headaches and now sees Dr. Krista Blue of [facility] neurology.  She has been treated with meloxicam and gabapentin.  She continues to experience fatigue.  She has a history of obesity but admits to an 8 pound weight loss over the last month.    When I saw her for initial evaluation on January 19, 2020 I felt her chest pain was noncardiac in etiology.  She describes it as sharp, at times continuous, and at times can last for days and was characteristically nonexertional.  She admitted to some exertional shortness of breath.  I recommended she undergo an echo Doppler study.  I also discussed her significant hyperlipidemia on prior laboratory assessment.  An echo Doppler study on Feb 05, 2020 showed hyperdynamic LV function with EF estimated 65 to 70%.  There was grade 1 diastolic dysfunction.  There was normal strain.   Presently, she denies any recurrent chest pain.  She denies any exertional shortness of breath or chest discomfort.  She has been trying to lose weight and she has significantly adjusted her diet.  She presents for evaluation.  Past Medical History:  Diagnosis Date  . Anxiety   . Arm vein blood clot, right   . Knee pain, left 01/2019  . Migraine   . Wears glasses    and contacts    Past Surgical History:  Procedure Laterality Date  . BREAST BIOPSY  10/31/2012   Procedure: BREAST BIOPSY WITH NEEDLE LOCALIZATION;  Surgeon: Rolm Bookbinder, MD;  Location: [place];  Service: General;  Laterality: Right;  right breast wire guided excisional biopsy  . BREAST EXCISIONAL BIOPSY Right 2014  . ganglion cysts  2011  . WRIST SURGERY Left     Current Medications: Outpatient Medications Prior to Visit  Medication Sig Dispense Refill  . clobetasol cream (TEMOVATE) 3.56 % Apply 1 application topically 2 (two) times daily. 30 g 2  . levonorgestrel (MIRENA) 20 MCG/24HR IUD 1 each by Intrauterine route once.    . ondansetron (ZOFRAN) 4 MG tablet Take 1 tablet (4 mg total) by mouth every 8 (eight) hours as needed for nausea or vomiting. 20 tablet 1  . Rimegepant Sulfate (NURTEC) 75 MG TBDP Take 75 mg by mouth as needed (take 1 tablet for acute headache, max is 75 mg/24 hours). 8 tablet 11  . SUMAtriptan (IMITREX) 6 MG/0.5ML SOLN injection  One injection at onset of migraine.  May repeat in 2 hrs, if needed.  Max dose: 2 inj/day. This is a 30 day prescription. (Patient taking differently: Inject 6 mg into the skin every 2 (two) hours as needed for migraine (One injection at onset.  May repeat in 2 hrs, if needed.  Max dose: 2 inj/day.). ) 5 mL 11  . tiZANidine (ZANAFLEX) 4 MG tablet TAKE ONE TABLET BY MOUTH THREE TIMES A DAY AS NEEDED FOR MIGRAINES FOR 30 DAYS (Patient taking differently: Take 4 mg by mouth every 8 (eight) hours as needed (For migraines.). ) 30 tablet 6  . triamcinolone  cream (KENALOG) 0.1 % APPLY TO AFFECTED AREA TWICE A DAY 30 g 2  . Vitamin D, Ergocalciferol, (DRISDOL) 1.25 MG (50000 UNIT) CAPS capsule Take 1 capsule (50,000 Units total) by mouth every 7 (seven) days. 4 capsule 2  . Diclofenac Sodium (PENNSAID) 2 % SOLN Place 2 g onto the skin 2 (two) times daily. (Patient taking differently: Place 2 g onto the skin 2 (two) times daily as needed (For knee pain.). ) 112 g 3   No facility-administered medications prior to visit.     Allergies:   Azithromycin, Codeine, Hydrocodone, Ketorolac tromethamine, Prednisone, and Tramadol   Social History   Socioeconomic History  . Marital status: Single    Spouse name: Not on file  . Number of children: 1  . Years of education: some college  . Highest education level: Not on file  Occupational History  . 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 Smoking status: Never Smoker  . Smokeless tobacco: Never Used  Substance and Sexual Activity  . Alcohol use: No  . Drug use: No  . Sexual activity: Yes  Other Topics Concern  . Not on file  Social History Narrative   Lives at home with her daughter.   Right-handed.   No caffeine use.   Social Determinants of Health   Financial Resource Strain:   . Difficulty of Paying Living Expenses:   Food Insecurity:   . Worried About Charity fundraiser in the Last Year:   . Arboriculturist in the Last Year:   Transportation Needs:   . Film/video editor (Medical):   Marland Kitchen Lack of Transportation (Non-Medical):   Physical Activity:   . Days of Exercise per Week:   . Minutes of Exercise per Session:   Stress:   . Feeling of Stress :   Social Connections:   . Frequency of Communication with Friends and Family:   . Frequency of Social Gatherings with Friends and Family:   . Attends Religious Services:   . Active Member of Clubs or Organizations:   . Attends Archivist Meetings:   Marland Kitchen Marital Status:     Additional social history is notable  that she is single and has 1 child a daughter age 88.  There is no history of tobacco use.  She attended [employer].  She currently works at [employer] as an Web designer.  Family History:  The patient's family history includes Aneurysm in her mother; Breast cancer in her cousin, maternal grandmother, and another family member; Diabetes in an other family member; Gout in an other family member; Hypertension in her mother and another family member; Migraines in her paternal aunt; Other in an other family member; Stomach cancer in her father.  Her mother died at age 33 and hypertension unfortunately died secondary  to a car accident.  Her father is living at age 80 and has stage IV colon cancer.  ROS General: Negative; No fevers, chills, or night sweats; positive fatigability HEENT: Negative; No changes in vision or hearing, sinus congestion, difficulty swallowing Pulmonary: Covid pneumonia completed a course of remdesivir in January 2021 Cardiovascular: See HPI GI: Negative; No nausea, vomiting, diarrhea, or abdominal pain GU: Negative; No dysuria, hematuria, or difficulty voiding Musculoskeletal: Negative; no myalgias, joint pain, or weakness Hematologic/Oncology: Negative; no easy bruising, bleeding Endocrine: Negative; no heat/cold intolerance; no diabetes Neuro: Positive for migraine headaches Skin: Negative; No rashes or skin lesions Psychiatric: Negative; No behavioral problems, depression Sleep: Negative; No snoring, daytime sleepiness, hypersomnolence, bruxism, restless legs, hypnogognic hallucinations, no cataplexy Other comprehensive 14 point system review is negative.   PHYSICAL EXAM:   VS:  BP 136/90   Pulse 81   Ht 5' 6"  (1.676 m)   Wt 206 lb 6.4 oz (93.6 kg)   SpO2 100%   BMI 33.31 kg/m     Repeat blood pressure by me 130/82  Wt Readings from Last 3 Encounters:  02/19/20 206 lb 6.4 oz (93.6 kg)  01/19/20 210 lb (95.3 kg)   01/15/20 211 lb 12.8 oz (96.1 kg)    General: Alert, oriented, no distress.  Skin: normal turgor, no rashes, warm and dry HEENT: Normocephalic, atraumatic. Pupils equal round and reactive to light; sclera anicteric; extraocular muscles intact; Nose without nasal septal hypertrophy Mouth/Parynx benign; Mallinpatti scale 3 Neck: No JVD, no carotid bruits; normal carotid upstroke Lungs: clear to ausculatation and percussion; no wheezing or rales Chest wall: without tenderness to palpitation Heart: PMI not displaced, RRR, s1 s2 normal, 1/6 systolic murmur, no diastolic murmur, no rubs, gallops, thrills, or heaves Abdomen: soft, nontender; no hepatosplenomehaly, BS+; abdominal aorta nontender and not dilated by palpation. Back: no CVA tenderness Pulses 2+ Musculoskeletal: full range of motion, normal strength, no joint deformities Extremities: no clubbing cyanosis or edema, Homan's sign negative  Neurologic: grossly nonfocal; Cranial nerves grossly wnl Psychologic: Normal mood and affect    Studies/Labs Reviewed:   EKG:  EKG is ordered today.  ECG (independently read by me): Normal sinus rhythm at a 67 bpm.  Nondiagnostic T wave abnormality in lead III, V1 and V2.  Recent Labs: BMP Latest Ref Rng & Units 12/17/2019 11/11/2019 11/11/2019  Glucose 70 - 99 mg/dL 89 86 100(H)  BUN 6 - 23 mg/dL 13 7 12   Creatinine 0.40 - 1.20 mg/dL 0.84 0.84 0.87  Sodium 135 - 145 mEq/L 138 139 138  Potassium 3.5 - 5.1 mEq/L 3.9 3.5 3.8  Chloride 96 - 112 mEq/L 102 104 107  CO2 19 - 32 mEq/L 29 24 23   Calcium 8.4 - 10.5 mg/dL 9.3 9.6 9.0     Hepatic Function Latest Ref Rng & Units 12/17/2019 10/18/2019 10/21/2018  Total Protein 6.0 - 8.3 g/dL 7.3 7.5 7.3  Albumin 3.5 - 5.2 g/dL 4.2 3.4(L) 4.3  AST 0 - 37 U/L 15 36 17  ALT 0 - 35 U/L 18 37 20  Alk Phosphatase 39 - 117 U/L 66 54 77  Total Bilirubin 0.2 - 1.2 mg/dL 0.4 0.3 0.3  Bilirubin, Direct 0.0 - 0.3 mg/dL 0.1 - 0.1    CBC Latest Ref Rng & Units  12/17/2019 11/11/2019 11/11/2019  WBC 4.0 - 10.5 K/uL 6.2 6.0 6.5  Hemoglobin 12.0 - 15.0 g/dL 14.6 14.8 13.8  Hematocrit 36.0 - 46.0 % 43.6 45.6 42.5  Platelets 150.0 - 400.0 K/uL  269.0 266 231   Lab Results  Component Value Date   MCV 90.3 12/17/2019   MCV 91.8 11/11/2019   MCV 92.4 11/11/2019   Lab Results  Component Value Date   TSH 1.97 12/17/2019   No results found for: HGBA1C   BNP No results found for: BNP  ProBNP No results found for: PROBNP   Lipid Panel     Component Value Date/Time   CHOL 234 (H) 12/17/2019 0926   TRIG 114.0 12/17/2019 0926   HDL 60.60 12/17/2019 0926   CHOLHDL 4 12/17/2019 0926   VLDL 22.8 12/17/2019 0926   LDLCALC 150 (H) 12/17/2019 0926   LDLDIRECT 164.5 09/19/2012 1052     RADIOLOGY: ECHOCARDIOGRAM COMPLETE  Result Date: 02/05/2020    ECHOCARDIOGRAM REPORT   Patient Name:   [name] Date of Exam: 02/05/2020 Medical Rec #:  [id number]          Height:       66.0 in Accession #:    [id number]         Weight:       210.0 lb Date of Birth:  [birth year]           BSA:          2.042 m Patient Age:    10 years           BP:           126/88 mmHg Patient Gender: F                  HR:           83 bpm. Exam Location:  [place] Procedure: 2D Echo, 3D Echo, Cardiac Doppler, Color Doppler and Strain Analysis Indications:    R07.9 Chest pain  History:        Patient has no prior history of Echocardiogram examinations.                 COVID-19. Pneumonia.  Sonographer:    Wilford Sports Rodgers-Jones RDCS Referring Phys: [place]  1. Left ventricular ejection fraction, by estimation, is 65 to 70%. The left ventricle has normal function. The left ventricle has no regional wall motion abnormalities. Left ventricular diastolic parameters are consistent with Grade I diastolic dysfunction (impaired relaxation). The average left ventricular global longitudinal strain is -22.5 %.  2. Right ventricular systolic function is normal. The right  ventricular size is normal.  3. The mitral valve is normal in structure. No evidence of mitral valve regurgitation. No evidence of mitral stenosis.  4. The aortic valve is normal in structure. Aortic valve regurgitation is not visualized. No aortic stenosis is present. FINDINGS  Left Ventricle: Left ventricular ejection fraction, by estimation, is 65 to 70%. The left ventricle has normal function. The left ventricle has no regional wall motion abnormalities. The average left ventricular global longitudinal strain is -22.5 %. The left ventricular internal cavity size was normal in size. There is no left ventricular hypertrophy. Left ventricular diastolic function could not be evaluated due to atrial fibrillation. Left ventricular diastolic parameters are consistent with Grade  I diastolic dysfunction (impaired relaxation). Right Ventricle: The right ventricular size is normal. No increase in right ventricular wall thickness. Right ventricular systolic function is normal. Left Atrium: Left atrial size was normal in size. Right Atrium: Right atrial size was normal in size. Pericardium: There is no evidence of pericardial effusion. Mitral Valve: The mitral valve is normal in structure. No evidence of  mitral valve regurgitation. No evidence of mitral valve stenosis. Tricuspid Valve: The tricuspid valve is normal in structure. Tricuspid valve regurgitation is not demonstrated. No evidence of tricuspid stenosis. Aortic Valve: The aortic valve is normal in structure. Aortic valve regurgitation is not visualized. No aortic stenosis is present. Pulmonic Valve: The pulmonic valve was normal in structure. Pulmonic valve regurgitation is trivial. No evidence of pulmonic stenosis. Aorta: The aortic root and ascending aorta are structurally normal, with no evidence of dilitation. IAS/Shunts: The atrial septum is grossly normal.  LEFT VENTRICLE PLAX 2D LVIDd:         4.20 cm  Diastology LVIDs:         2.60 cm  LV e' lateral:   9.25  cm/s LV PW:         1.10 cm  LV E/e' lateral: 6.1 LV IVS:        0.80 cm  LV e' medial:    8.16 cm/s LVOT diam:     1.90 cm  LV E/e' medial:  6.9 LV SV:         51 LV SV Index:   25       2D Longitudinal Strain LVOT Area:     2.84 cm 2D Strain GLS (A2C):   -22.6 %                         2D Strain GLS (A3C):   -22.2 %                         2D Strain GLS (A4C):   -22.6 %                         2D Strain GLS Avg:     -22.5 %                          3D Volume EF:                         3D EF:        59 %                         LV EDV:       114 ml                         LV ESV:       47 ml                         LV SV:        67 ml RIGHT VENTRICLE RV Basal diam:  3.30 cm RV S prime:     17.00 cm/s TAPSE (M-mode): 2.3 cm LEFT ATRIUM             Index       RIGHT ATRIUM          Index LA diam:        3.80 cm 1.86 cm/m  RA Area:     9.31 cm LA Vol (A2C):   50.5 ml 24.73 ml/m RA Volume:   19.80 ml 9.70 ml/m LA Vol (A4C):   24.6 ml 12.05 ml/m LA Biplane Vol: 37.9 ml 18.56 ml/m  AORTIC VALVE LVOT Vmax:  99.80 cm/s LVOT Vmean:  70.100 cm/s LVOT VTI:    0.180 m  AORTA Ao Root diam: 3.10 cm Ao Asc diam:  2.80 cm MITRAL VALVE MV Area (PHT): 3.65 cm    SHUNTS MV Decel Time: 208 msec    Systemic VTI:  0.18 m MV E velocity: 56.60 cm/s  Systemic Diam: 1.90 cm MV A velocity: 70.70 cm/s MV E/A ratio:  0.80 Mertie Moores MD Electronically signed by Mertie Moores MD Signature Date/Time: 02/05/2020/5:20:40 PM    Final      Additional studies/ records that were reviewed today include:  I reviewed the patient's records from of our pulmonary as well as her hospitalization  ECHO 02/05/2020 IMPRESSIONS  1. Left ventricular ejection fraction, by estimation, is 65 to 70%. The  left ventricle has normal function. The left ventricle has no regional  wall motion abnormalities. Left ventricular diastolic parameters are  consistent with Grade I diastolic  dysfunction (impaired relaxation). The average left ventricular  global  longitudinal strain is -22.5 %.  2. Right ventricular systolic function is normal. The right ventricular  size is normal.  3. The mitral valve is normal in structure. No evidence of mitral valve  regurgitation. No evidence of mitral stenosis.  4. The aortic valve is normal in structure. Aortic valve regurgitation is  not visualized. No aortic stenosis is present.   ASSESSMENT:    1. Atypical chest pain   2. Pure hypercholesterolemia   3. Obesity (BMI 30.0-34.9)   4. History of COVID-19   5. Chronic migraine    PLAN:  Ms. Jamell Opfer is a 44 year old African-American female who has a remote history for right upper extremity DVT and developed Covid in January 2021 associated with fevers up to 104 cough and marked fatigability.  She completed a full course of remdesivir.  Subsequent chest x-rays have not shown acute abnormality and repeat CT imaging in February 2021 did not show evidence for pulmonary embolism and showed mild right middle lobe, lingular and left lower lobe atelectasis which was improved from January.  When I saw her for initial evaluation, she had noticed some episodes of nonexertional chest pain which initiated 2 weeks after her hospitalization.  Her pain was atypical for ischemic heart disease and was described as sharp, at times continuous, and at times can last for days.  Since she had suffered Covid, I recommended she undergo a follow-up echo Doppler study to make certain she did not have any delayed myocarditis or subsequent development of LV dysfunction.  I reviewed her echo Doppler study with her in detail today which showed an EF of 65 to 70%.  There was grade 1 diastolic dysfunction.  She had normal valvular architecture.  She does have hyperlipidemia on laboratory from March 2017 with LDL cholesterol at 150.  I discussed with her the atherogenic potential of that LDL level.  She has significantly adjusted her diet.  She is followed by Dr. Cathlean Cower and has  plans for follow-up laboratory.  She also is vitamin D insufficient and has been on supplemental vitamin D at 50,000 units weekly for 3 months.  BMI is increased and consistent with mild obesity.  I discussed the importance of weight loss and have recommended that she does some form of moderate intensity exercise at least 5 days/week for 30 minutes at a time if at all possible.  If subsequent lipid studies come back elevated I would recommend initiation of lipid-lowering therapy.  Her migraine headaches are followed by Dr.  Krista Blue of [facility] neurology and she is on meloxicam and gabapentin.  I will see her in 6 months for cardiology evaluation or sooner as needed.   Medication Adjustments/Labs and Tests Ordered: Current medicines are reviewed at length with the patient today.  Concerns regarding medicines are outlined above.  Medication changes, Labs and Tests ordered today are listed in the Patient Instructions below. Patient Instructions     Lab Work: GET BLOOD WORK IN ONE MONTH    Follow-Up: At [facility], you and your health needs are our priority.  As part of our continuing mission to provide you with exceptional heart care, we have created designated Provider Care Teams.  These Care Teams include your primary Cardiologist (physician) and Advanced Practice Providers (APPs -  Physician Assistants and Nurse Practitioners) who all work together to provide you with the care you need, when you need it.  We recommend signing up for the patient portal called "MyChart".  Sign up information is provided on this After Visit Summary.  MyChart is used to connect with patients for Virtual Visits (Telemedicine).  Patients are able to view lab/test results, encounter notes, upcoming appointments, etc.  Non-urgent messages can be sent to your provider as well.   To learn more about what you can do with MyChart, go to [email or web address].    Your next appointment:   6 month(s)  The format for your  next appointment:   In Person  Provider:   Shelva Majestic, MD       Signed, Shelva Majestic, MD  02/21/2020 2:58 PM    [facility] [address] [place], [place], [place]  [address] Phone: [phone]

## 2020-03-03 NOTE — Telephone Encounter (Signed)
Pt called to discuss Nurtec states she was unable to get coverage for medication and was advised by pharmacy medication would cost $1,200 and would like to know if she can be prescribed another medication

## 2020-03-04 NOTE — Telephone Encounter (Signed)
I called pharmacy and spoke to [facility].  He states that needs PA and card only good for one time use. Attempted to do a PA and was  Told udate was being done and to call back in 1 hour.

## 2020-03-09 ENCOUNTER — Telehealth: Payer: Self-pay | Admitting: *Deleted

## 2020-03-09 NOTE — Telephone Encounter (Signed)
CMM

## 2020-03-09 NOTE — Telephone Encounter (Signed)
CMM initiated KEY [id number] for Nurtec 85m tabs.  Approval given 02-08-20 thru 03-09-2021 CASE [id number] Fax confirmation received to [facility] [phone].

## 2020-03-24 ENCOUNTER — Other Ambulatory Visit: Payer: Self-pay

## 2020-03-24 ENCOUNTER — Ambulatory Visit (INDEPENDENT_AMBULATORY_CARE_PROVIDER_SITE_OTHER): Payer: 59 | Admitting: Emergency Medicine

## 2020-03-24 ENCOUNTER — Encounter: Payer: Self-pay | Admitting: Emergency Medicine

## 2020-03-24 VITALS — BP 132/82 | HR 74 | Temp 98.2°F | Ht 66.0 in | Wt 200.6 lb

## 2020-03-24 DIAGNOSIS — R0789 Other chest pain: Secondary | ICD-10-CM

## 2020-03-24 DIAGNOSIS — J1282 Pneumonia due to coronavirus disease 2019: Secondary | ICD-10-CM

## 2020-03-24 DIAGNOSIS — U071 COVID-19: Secondary | ICD-10-CM

## 2020-03-24 DIAGNOSIS — R0602 Shortness of breath: Secondary | ICD-10-CM | POA: Diagnosis not present

## 2020-03-24 NOTE — Assessment & Plan Note (Signed)
She had been improving post COVID-19 pneumonia.  Now seems to be having recurrent symptoms.  Unclear etiology.  Her imaging in February was reassuring with some residual scar on CT.  I think she needs pulmonary function testing to better evaluate.  We may need to repeat her imaging as well going forward.  I will check a chest x-ray next time.

## 2020-03-24 NOTE — Progress Notes (Signed)
Subjective:    Patient ID: [name], female    DOB: 04/03/1976, 44 y.o.   MRN: [id number]  HPI 44 year old woman, never smoker, with a history of migraines, right upper extremity DVT.  She presents today as a new consultation for evaluation of an abnormal CT scan of the chest.  She was originally evaluated in the ED 7/19 for chest discomfort. In the end she had a reassuring evaluation.  A chest x-ray was done that day that showed slightly increased hazy interstitial markings in the right midlung.  Etiology was unclear, prompted a CT scan of the chest that was done on 05/16/2019 and I reviewed.  This shows some anterior right middle lobe linear opacity most consistent with subsegmental atelectasis, consider scar.  She also had some mild right paratracheal lymphadenopathy.  Significance unclear.   She has never had any chronic pulmonary issues, no asthma, no thoracic surgeries.  She believes that she may have been dx with PNA before, but she is unsure. Denies any breathing trouble. No cough.  The CT abnormalities were found completely spuriously.  ROV 11/14/19 -52 year old woman with history of right upper extremity DVT seen originally following a CT chest done 05/16/2019 after she had chest discomfort.  There was some anterior right middle lobe linear opacity most consistent with atelectasis, consider scar and some mild right paratracheal lymphadenopathy.  She has continued to have on and off chest discomfort, was unfortunately diagnosed with COVID-19 1/8 associated with this and also continued cough.  She went to the emergency department for evaluation 2/9 and a repeat CT chest was performed which I have reviewed, shows no evidence of pulmonary embolism, some mild right middle lobe, lingular and left lower lobe atelectatic change that is improved compared with her prior.  Her lymphadenopathy is resolved. She still has SOB with exertion, slowly getting better. She has some mid-chest pain, has  minimal cough. The mid CP is superficial, ? Msk. No overt GERD sx  ROV 03/24/20 --pleasant 44 year old woman with a history of COVID-19 pneumonia, right upper extremity DVT residual atelectasis/scar noted on CT chest.  She is following with Dr Claiborne Billings for her CP, her breathing has been labile.  She had initially improved and was approaching her previous baseline exercise tolerance.  Now she is having exertional SOB with walking up stairs for the last few weeks. No wheeze. No Cough.    Review of Systems  Constitutional: Negative for fever and unexpected weight change.  HENT: Negative for congestion, dental problem, ear pain, nosebleeds, postnasal drip, rhinorrhea, sinus pressure, sneezing, sore throat and trouble swallowing.   Eyes: Negative for redness and itching.  Respiratory: Positive for shortness of breath. Negative for cough, chest tightness and wheezing.   Cardiovascular: Negative for palpitations and leg swelling.  Gastrointestinal: Negative for nausea and vomiting.  Genitourinary: Negative for dysuria.  Musculoskeletal: Negative for joint swelling.  Skin: Negative for rash.  Neurological: Negative for headaches.  Hematological: Does not bruise/bleed easily.  Psychiatric/Behavioral: Negative for dysphoric mood. The patient is not nervous/anxious.        Objective:   Physical Exam Vitals:   03/24/20 1600  BP: 132/82  Pulse: 74  Temp: 98.2 F (36.8 C)  TempSrc: Oral  SpO2: 97%  Weight: 200 lb 9.6 oz (91 kg)  Height: 5' 6"  (1.676 m)   Gen: Pleasant, overwt woman, in no distress,  normal affect  ENT: No lesions,  mouth clear,  oropharynx clear, no postnasal drip  Neck: No JVD, no stridor  Lungs: No use of accessory muscles, no crackles or wheezing on normal respiration, no wheeze on forced expiration  Cardiovascular: RRR, heart sounds normal, no murmur or gallops, no peripheral edema  Musculoskeletal: No deformities, no cyanosis or clubbing  Neuro: alert, awake, non  focal  Skin: Warm, no lesions or rash      Assessment & Plan:  Atypical chest pain Now following with Dr. Claiborne Billings with cardiology.  SOB (shortness of breath) She had been improving post COVID-19 pneumonia.  Now seems to be having recurrent symptoms.  Unclear etiology.  Her imaging in February was reassuring with some residual scar on CT.  I think she needs pulmonary function testing to better evaluate.  We may need to repeat her imaging as well going forward.  I will check a chest x-ray next time.  Baltazar Apo, MD, PhD 03/24/2020, 4:41 PM [facility] [phone] or if no answer [phone]

## 2020-03-24 NOTE — Patient Instructions (Signed)
We will arrange for pulmonary function testing  Follow with Dr. Lamonte Sakai next available with full pulmonary function testing on the same day.

## 2020-03-24 NOTE — Assessment & Plan Note (Signed)
Now following with Dr. Claiborne Billings with cardiology.

## 2020-03-30 ENCOUNTER — Ambulatory Visit
Admission: RE | Admit: 2020-03-30 | Discharge: 2020-03-30 | Disposition: A | Discharge status: Home / Self Care | Payer: 59 | Source: Ambulatory Visit | Attending: Obstetrics and Gynecology | Admitting: Obstetrics and Gynecology

## 2020-03-30 ENCOUNTER — Other Ambulatory Visit: Payer: Self-pay

## 2020-03-30 DIAGNOSIS — N6320 Unspecified lump in the left breast, unspecified quadrant: Secondary | ICD-10-CM

## 2020-03-30 LAB — LIPID PANEL
Chol/HDL Ratio: 3.7 ratio (ref 0.0–4.4)
Cholesterol, Total: 232 mg/dL — ABNORMAL HIGH (ref 100–199)
HDL: 63 mg/dL (ref >39)
LDL Chol Calc (NIH): 156 mg/dL — ABNORMAL HIGH (ref 0–99)
Triglycerides: 74 mg/dL (ref 0–149)
VLDL Cholesterol Cal: 13 mg/dL (ref 5–40)

## 2020-03-30 LAB — COMPREHENSIVE METABOLIC PANEL
ALT: 12 IU/L (ref 0–32)
AST: 16 IU/L (ref 0–40)
Albumin/Globulin Ratio: 1.6 (ref 1.2–2.2)
Albumin: 4.5 g/dL (ref 3.8–4.8)
Alkaline Phosphatase: 93 IU/L (ref 48–121)
BUN/Creatinine Ratio: 11 (ref 9–23)
BUN: 12 mg/dL (ref 6–24)
Bilirubin Total: 0.5 mg/dL (ref 0.0–1.2)
CO2: 22 mmol/L (ref 20–29)
Calcium: 9.9 mg/dL (ref 8.7–10.2)
Chloride: 103 mmol/L (ref 96–106)
Creatinine, Ser: 1.1 mg/dL — ABNORMAL HIGH (ref 0.57–1.00)
GFR calc Af Amer: 71 mL/min/{1.73_m2} (ref >59)
GFR calc non Af Amer: 62 mL/min/{1.73_m2} (ref >59)
Globulin, Total: 2.8 g/dL (ref 1.5–4.5)
Glucose: 88 mg/dL (ref 65–99)
Potassium: 4.9 mmol/L (ref 3.5–5.2)
Sodium: 140 mmol/L (ref 134–144)
Total Protein: 7.3 g/dL (ref 6.0–8.5)

## 2020-04-01 ENCOUNTER — Telehealth: Payer: Self-pay

## 2020-04-01 DIAGNOSIS — E781 Pure hyperglyceridemia: Secondary | ICD-10-CM

## 2020-04-01 DIAGNOSIS — Z79899 Other long term (current) drug therapy: Secondary | ICD-10-CM

## 2020-04-01 MED ORDER — ROSUVASTATIN CALCIUM 20 MG PO TABS: 20.0000 mg | ORAL_TABLET | Freq: Every day | ORAL | 2 refills | Status: DC

## 2020-04-01 NOTE — Telephone Encounter (Signed)
Called and reviewed results with pt. Pt weary of starting a new medication. She states she just got her migranes under control and does not want to take anything that will trigger them. Notified I was unaware of rosuvastatin causing migranes, notified that a common side effect was muscle and joint pain. Pt okay with [name] sending the medication and she will do research on it and will let us know her decision on taking it or not. Will send lab slips as well.

## 2020-04-02 NOTE — Telephone Encounter (Signed)
Rosuvastatin should not cause migraines.  Obviously, it is her decision if she started or not but my recommendation would be to initiate therapy

## 2020-04-06 NOTE — Telephone Encounter (Signed)
Called patient, advised of message from MD.  Patient verbalized understanding- she did not say that she was starting the medication or not.

## 2020-04-12 ENCOUNTER — Ambulatory Visit: Payer: 59 | Admitting: Cardiovascular Disease

## 2020-05-03 ENCOUNTER — Ambulatory Visit (INDEPENDENT_AMBULATORY_CARE_PROVIDER_SITE_OTHER): Payer: 59 | Admitting: Neurology

## 2020-05-03 ENCOUNTER — Encounter: Payer: Self-pay | Admitting: Neurology

## 2020-05-03 ENCOUNTER — Ambulatory Visit: Payer: 59 | Admitting: Neurology

## 2020-05-03 ENCOUNTER — Other Ambulatory Visit: Payer: Self-pay

## 2020-05-03 VITALS — BP 128/86 | HR 78 | Ht 66.0 in | Wt 193.0 lb

## 2020-05-03 DIAGNOSIS — G43709 Chronic migraine without aura, not intractable, without status migrainosus: Secondary | ICD-10-CM

## 2020-05-03 DIAGNOSIS — IMO0002 Reserved for concepts with insufficient information to code with codable children: Secondary | ICD-10-CM

## 2020-05-03 MED ORDER — ONDANSETRON HCL 4 MG PO TABS: 4.0000 mg | ORAL_TABLET | Freq: Three times a day (TID) | ORAL | 5 refills | Status: DC | PRN

## 2020-05-03 MED ORDER — DICLOFENAC POTASSIUM(MIGRAINE) 50 MG PO PACK: 50.0000 mg | PACK | ORAL | 5 refills | Status: DC | PRN

## 2020-05-03 MED ORDER — TIZANIDINE HCL 4 MG PO TABS: 4.0000 mg | ORAL_TABLET | Freq: Three times a day (TID) | ORAL | 5 refills | Status: DC | PRN
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
## 2020-05-03 NOTE — Patient Instructions (Signed)
you may take Imitrex injection, along with tizanidine as muscle relaxant, Zofran for nausea, diclofenac potassium as needed for prolonged intense headache,  Nurtec as needed for severe headache

## 2020-05-12 ENCOUNTER — Ambulatory Visit (INDEPENDENT_AMBULATORY_CARE_PROVIDER_SITE_OTHER): Payer: 59 | Admitting: Emergency Medicine

## 2020-05-12 ENCOUNTER — Ambulatory Visit (INDEPENDENT_AMBULATORY_CARE_PROVIDER_SITE_OTHER): Payer: 59

## 2020-05-12 ENCOUNTER — Encounter: Payer: Self-pay | Admitting: Emergency Medicine

## 2020-05-12 ENCOUNTER — Other Ambulatory Visit: Payer: Self-pay

## 2020-05-12 VITALS — BP 118/68 | HR 108 | Temp 98.3°F | Ht 66.0 in | Wt 192.0 lb

## 2020-05-12 DIAGNOSIS — R0602 Shortness of breath: Secondary | ICD-10-CM

## 2020-05-12 DIAGNOSIS — R0789 Other chest pain: Secondary | ICD-10-CM

## 2020-05-12 DIAGNOSIS — J1282 Pneumonia due to coronavirus disease 2019: Secondary | ICD-10-CM

## 2020-05-12 LAB — PULMONARY FUNCTION TEST
DL/VA % pred: 130 %
DL/VA: 5.64 ml/min/mmHg/L
DLCO cor % pred: 103 %
DLCO cor: 23.93 ml/min/mmHg
DLCO unc % pred: 103 %
DLCO unc: 23.93 ml/min/mmHg
FEF 25-75 Post: 3.38 L/sec
FEF 25-75 Pre: 3.03 L/sec
FEF2575-%Change-Post: 11 %
FEF2575-%Pred-Post: 118 %
FEF2575-%Pred-Pre: 105 %
FEV1-%Change-Post: 2 %
FEV1-%Pred-Post: 107 %
FEV1-%Pred-Pre: 104 %
FEV1-Post: 2.85 L
FEV1-Pre: 2.79 L
FEV1FVC-%Change-Post: 0 %
FEV1FVC-%Pred-Pre: 101 %
FEV6-%Change-Post: 2 %
FEV6-%Pred-Post: 106 %
FEV6-%Pred-Pre: 103 %
FEV6-Post: 3.39 L
FEV6-Pre: 3.31 L
FEV6FVC-%Change-Post: 0 %
FEV6FVC-%Pred-Post: 102 %
FEV6FVC-%Pred-Pre: 101 %
FVC-%Change-Post: 2 %
FVC-%Pred-Post: 103 %
FVC-%Pred-Pre: 101 %
FVC-Post: 3.39 L
FVC-Pre: 3.32 L
Post FEV1/FVC ratio: 84 %
Post FEV6/FVC ratio: 100 %
Pre FEV1/FVC ratio: 84 %
Pre FEV6/FVC Ratio: 100 %
RV % pred: 78 %
RV: 1.37 L
TLC % pred: 86 %
TLC: 4.65 L

## 2020-05-12 NOTE — Progress Notes (Signed)
PFT done today. 

## 2020-05-12 NOTE — Patient Instructions (Signed)
Your chest x-ray is stable without any evidence of new scarring or problems following COVID-19 pneumonia.  Your pulmonary function testing is normal.  This is all good news. Continue to work on building up your cardiopulmonary conditioning, healthy diet, weight loss. Please call our office and follow-up if you develop any new breathing symptoms so that we can evaluate.

## 2020-05-12 NOTE — Progress Notes (Signed)
Subjective:    Patient ID: Lauren Wolfe, female    DOB: September 25, 1976, 44 y.o.   MRN: [id number]  HPI 44 year old woman, never smoker, with a history of migraines, right upper extremity DVT.  She presents today as a new consultation for evaluation of an abnormal CT scan of the chest.   ROV 05/12/20 --follow-up visit for Ms. Lauren Wolfe who is 15, has a history of a right upper extremity DVT, COVID-19 pneumonia in January 2021.  She has had progressive exertional dyspnea over the last several months.  Even after some initial improvement post Covid.  Chest x-ray performed today reviewed by me, shows prominent right pulmonary vasculature but no significant interstitial change or infiltrate.  She underwent pulmonary function testing today which I have reviewed, showed normal spirometry without a bronchodilator response, normal lung volumes with the exception of a slightly decreased RV (78%) and normal diffusion capacity.  Her flow volume loop is normal. She is still having difficulty with stairs. Is unable to walk any significant distance. She is working on diet - has lost 30+ lbs since January.   MDM: Reviewed PFT, CXR from today Reviewed Neurology notes from 05/03/20   Review of Systems  Constitutional: Negative for fever and unexpected weight change.  HENT: Negative for congestion, dental problem, ear pain, nosebleeds, postnasal drip, rhinorrhea, sinus pressure, sneezing, sore throat and trouble swallowing.   Eyes: Negative for redness and itching.  Respiratory: Positive for shortness of breath. Negative for cough, chest tightness and wheezing.   Cardiovascular: Negative for palpitations and leg swelling.  Gastrointestinal: Negative for nausea and vomiting.  Genitourinary: Negative for dysuria.  Musculoskeletal: Negative for joint swelling.  Skin: Negative for rash.  Neurological: Negative for headaches.  Hematological: Does not bruise/bleed easily.  Psychiatric/Behavioral: Negative for  dysphoric mood. The patient is not nervous/anxious.       Objective:   Physical Exam Vitals:   05/12/20 1609  BP: 118/68  Pulse: (!) 108  Temp: 98.3 F (36.8 C)  TempSrc: Temporal  SpO2: 99%  Weight: 192 lb (87.1 kg)  Height: 5' 6"  (1.676 m)   Gen: Pleasant, overwt woman, in no distress,  normal affect  ENT: No lesions,  mouth clear,  oropharynx clear, no postnasal drip  Neck: No JVD, no stridor  Lungs: No use of accessory muscles, no crackles or wheezing on normal respiration, no wheeze on forced expiration  Cardiovascular: RRR, heart sounds normal, no murmur or gallops, no peripheral edema  Musculoskeletal: No deformities, no cyanosis or clubbing  Neuro: alert, awake, non focal  Skin: Warm, no lesions or rash      Assessment & Plan:  SOB (shortness of breath) Overall reassuring evaluation.  Chest x-ray today is stable without any evidence of any new interstitial changes.  She has normal pulmonary function testing.  I do not think she needs any other pulmonary work-up.  I think that she will likely improve her functional capacity as she works on her cardiopulmonary conditioning.  She has lost 30 pounds since January and is still working on diet and exercise.  She can follow-up here as needed for any changes in symptoms.  Your chest x-ray is stable without any evidence of new scarring or problems following COVID-19 pneumonia.  Your pulmonary function testing is normal.  This is all good news. Continue to work on building up your cardiopulmonary conditioning, healthy diet, weight loss. Please call our office and follow-up if you develop any new breathing symptoms so that we can evaluate  Baltazar Apo, MD, PhD 05/12/2020, 4:25 PM [facility] [phone] or if no answer [phone]

## 2020-05-12 NOTE — Assessment & Plan Note (Signed)
Overall reassuring evaluation.  Chest x-ray today is stable without any evidence of any new interstitial changes.  She has normal pulmonary function testing.  I do not think she needs any other pulmonary work-up.  I think that she will likely improve her functional capacity as she works on her cardiopulmonary conditioning.  She has lost 30 pounds since January and is still working on diet and exercise.  She can follow-up here as needed for any changes in symptoms.  Your chest x-ray is stable without any evidence of new scarring or problems following COVID-19 pneumonia.  Your pulmonary function testing is normal.  This is all good news. Continue to work on building up your cardiopulmonary conditioning, healthy diet, weight loss. Please call our office and follow-up if you develop any new breathing symptoms so that we can evaluate

## 2020-06-14 ENCOUNTER — Other Ambulatory Visit: Payer: Self-pay

## 2020-06-14 MED ORDER — ROSUVASTATIN CALCIUM 20 MG PO TABS: 20.0000 mg | ORAL_TABLET | Freq: Every day | ORAL | 3 refills | Status: DC

## 2020-06-16 IMAGING — CT CT CHEST WITHOUT CONTRAST
2 of 3 series · 15 of 36 positions shown, 18 images · non-contrast
Comparison: Chest radiograph on 04/20/2019

CLINICAL DATA: Right lung opacity on recent chest radiograph. Chest
pain.

EXAM:
CT CHEST WITHOUT CONTRAST
TECHNIQUE: Multidetector CT imaging of the chest was performed following the
standard protocol without IV contrast.

[Series 2: thorax · axial · 0.73mm/px · z∈[-255,-9]mm · 12 of 145 slices shown, 15 images]
[im 11/145  mediastinal]
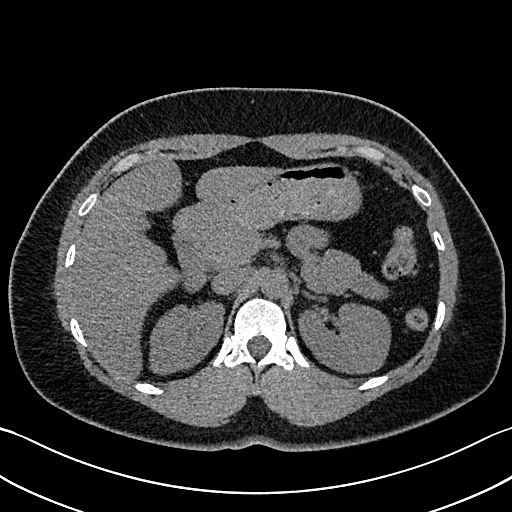
[im 11/145  lung]
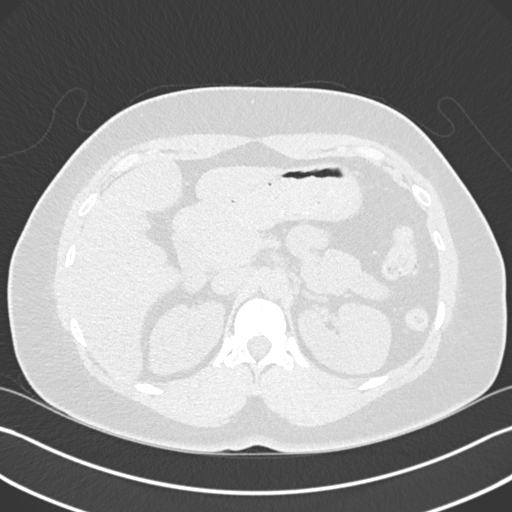
[im 22/145  lung]
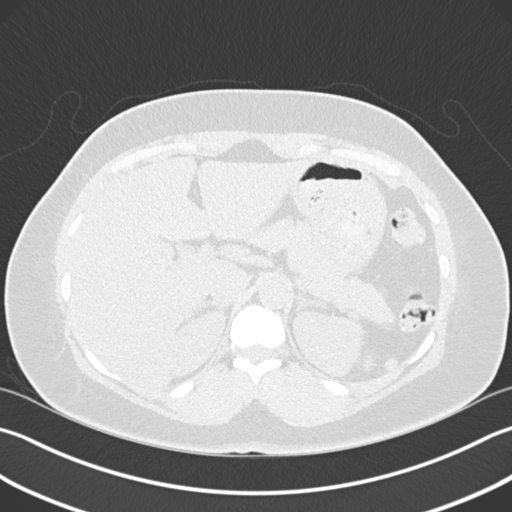
[im 33/145  lung]
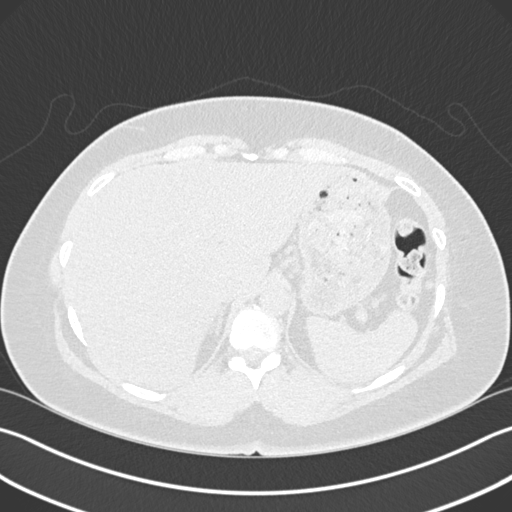
[im 43/145  lung]
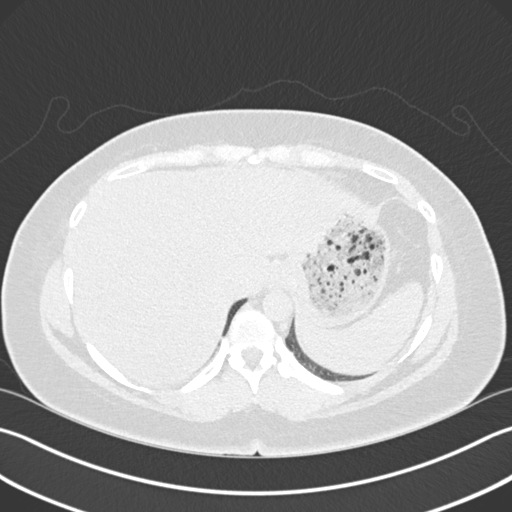
[im 54/145  mediastinal]
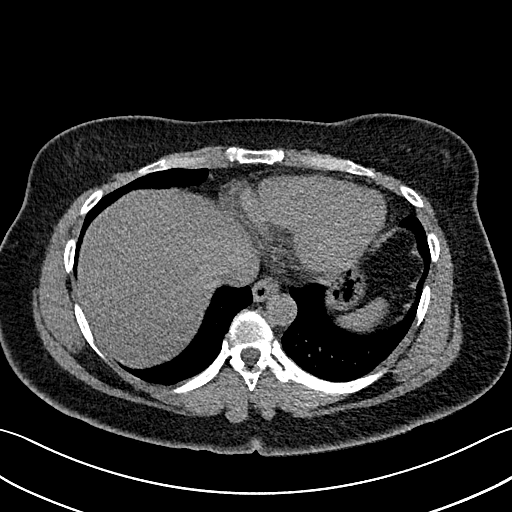
[im 54/145  lung]
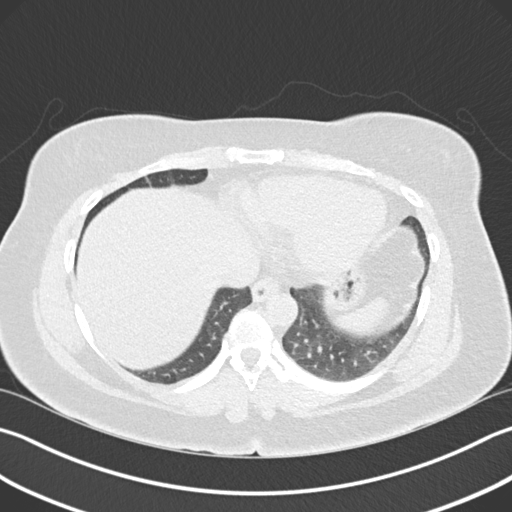
[im 65/145  lung]
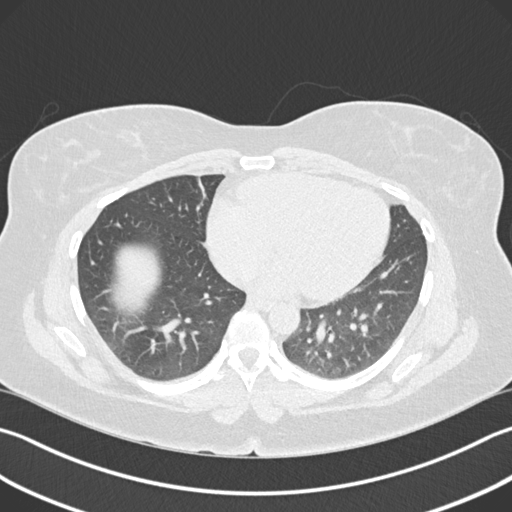
[im 81/145  lung]
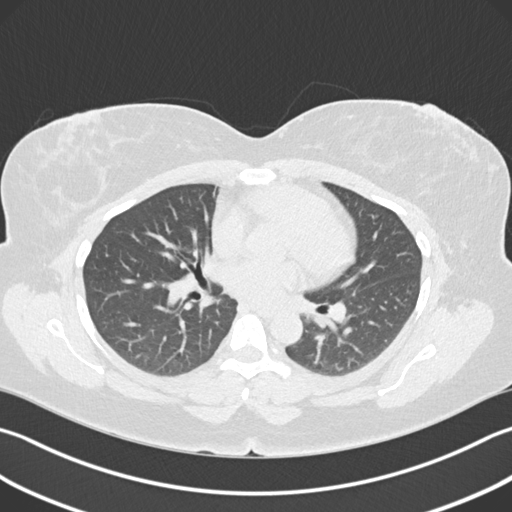
[im 91/145  lung]
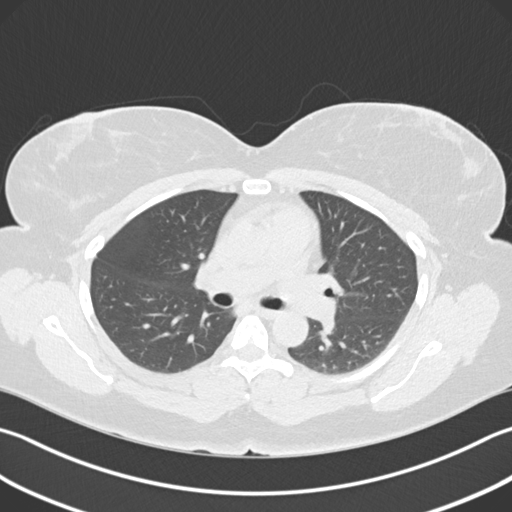
[im 102/145  mediastinal]
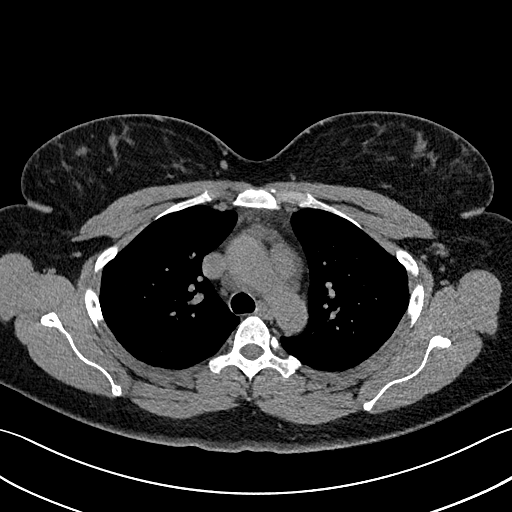
[im 102/145  lung]
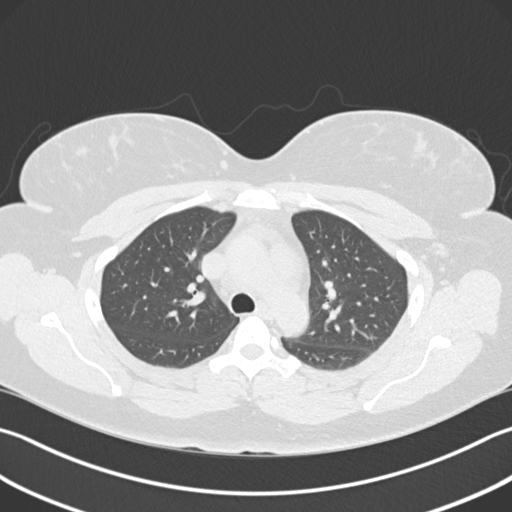
[im 113/145  lung]
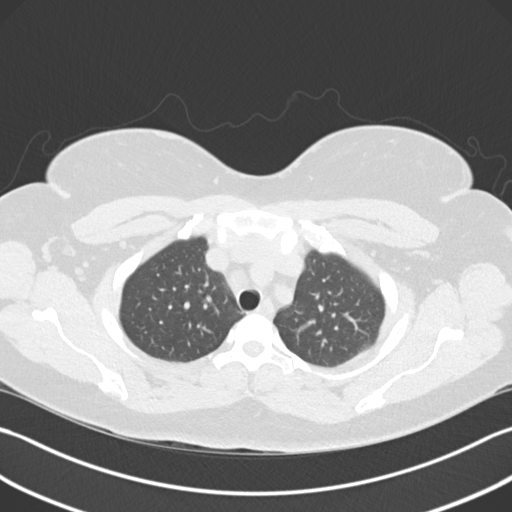
[im 123/145  lung]
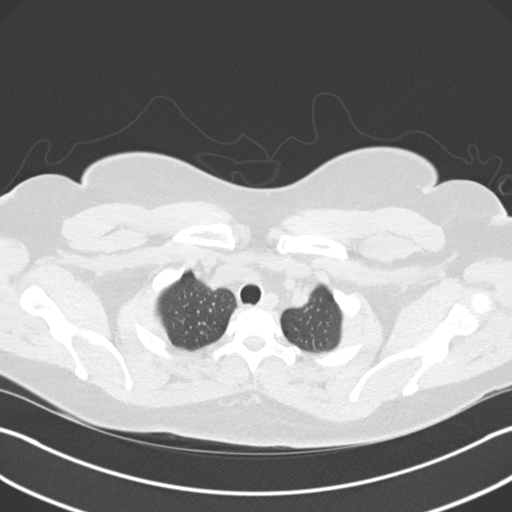
[im 134/145  lung]
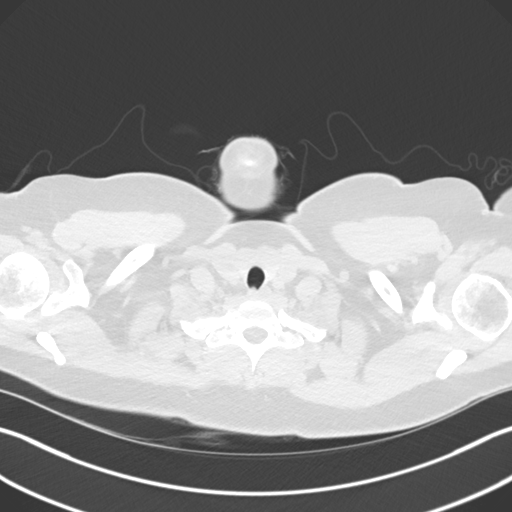

[Series 5: coronal · coronal · 0.58mm/px · 3 of 98 slices shown]
[im 20/98  lung]
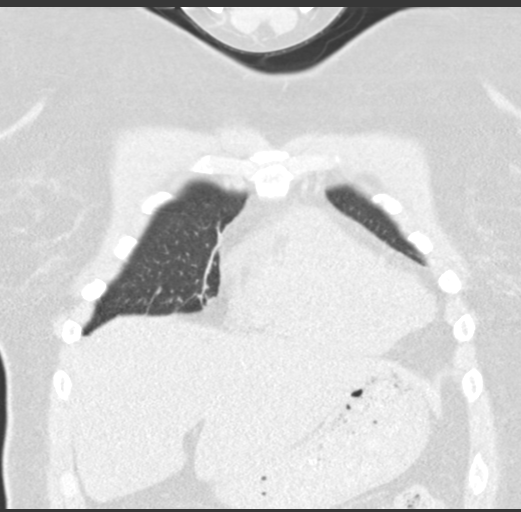
[im 39/98  lung]
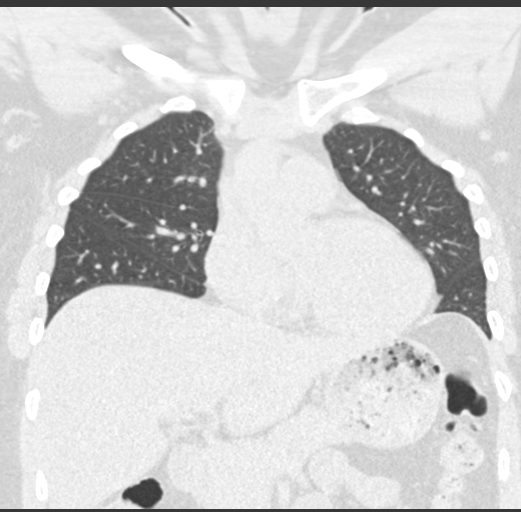
[im 59/98  lung]
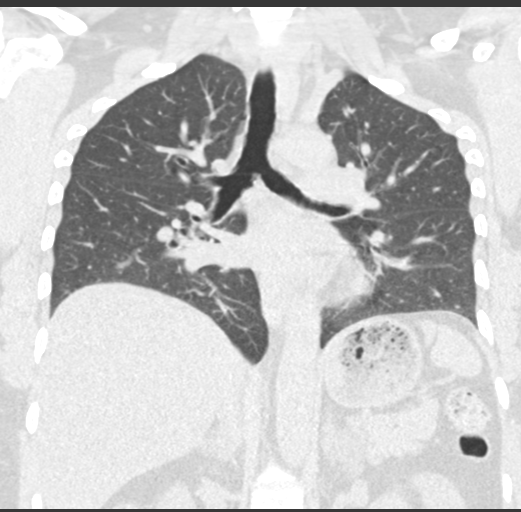

[15 of 36 positions shown; findings below may reference images not displayed]

FINDINGS: Cardiovascular:  No acute findings.

Mediastinum/Nodes: Mild mediastinal lymphadenopathy is seen in the
right paratracheal region with 2 lymph nodes measuring 13 mm in
short axis. No other pathologically enlarged lymph nodes identified,
although evaluation hilar regions is suboptimal without IV contrast.

Lungs/Pleura: No evidence pulmonary infiltrate or pleural effusion.
Mild linear opacity in the right middle lobe may be due to scarring
or subsegmental atelectasis. No suspicious pulmonary nodules or
masses identified.

Upper Abdomen:  Unremarkable.

Musculoskeletal:  No suspicious bone lesions.
IMPRESSION: 1. Mild right middle lobe scarring versus subsegmental atelectasis.
2. No evidence of pulmonary neoplasm or consolidation.
3. Mild right paratracheal mediastinal lymphadenopathy, which is
nonspecific. Recommend continued follow-up by chest CT in 6 months.

## 2020-06-22 ENCOUNTER — Other Ambulatory Visit: Payer: Self-pay

## 2020-06-22 MED ORDER — TRIAMCINOLONE ACETONIDE 0.1 % EX CREA: TOPICAL_CREAM | CUTANEOUS | 2 refills | Status: DC

## 2020-06-28 ENCOUNTER — Other Ambulatory Visit: Payer: Self-pay | Admitting: *Deleted

## 2020-07-23 ENCOUNTER — Other Ambulatory Visit: Payer: Self-pay | Admitting: Internal Medicine

## 2020-07-23 NOTE — Telephone Encounter (Signed)
Please change to OTC Vitamin D3 at 2000 units per day, indefinitely.

## 2020-07-30 ENCOUNTER — Other Ambulatory Visit: Payer: Self-pay

## 2020-07-30 DIAGNOSIS — E781 Pure hyperglyceridemia: Secondary | ICD-10-CM

## 2020-07-30 DIAGNOSIS — Z79899 Other long term (current) drug therapy: Secondary | ICD-10-CM

## 2020-07-30 LAB — COMPREHENSIVE METABOLIC PANEL
ALT: 11 IU/L (ref 0–32)
AST: 18 IU/L (ref 0–40)
Albumin/Globulin Ratio: 1.6 (ref 1.2–2.2)
Albumin: 4.4 g/dL (ref 3.8–4.8)
Alkaline Phosphatase: 88 IU/L (ref 44–121)
BUN/Creatinine Ratio: 10 (ref 9–23)
BUN: 10 mg/dL (ref 6–24)
Bilirubin Total: 0.4 mg/dL (ref 0.0–1.2)
CO2: 25 mmol/L (ref 20–29)
Calcium: 9.5 mg/dL (ref 8.7–10.2)
Chloride: 101 mmol/L (ref 96–106)
Creatinine, Ser: 1.02 mg/dL — ABNORMAL HIGH (ref 0.57–1.00)
GFR calc Af Amer: 77 mL/min/{1.73_m2} (ref >59)
GFR calc non Af Amer: 67 mL/min/{1.73_m2} (ref >59)
Globulin, Total: 2.7 g/dL (ref 1.5–4.5)
Glucose: 86 mg/dL (ref 65–99)
Potassium: 4.3 mmol/L (ref 3.5–5.2)
Sodium: 137 mmol/L (ref 134–144)
Total Protein: 7.1 g/dL (ref 6.0–8.5)

## 2020-07-30 LAB — LIPID PANEL
Chol/HDL Ratio: 2.4 ratio (ref 0.0–4.4)
Cholesterol, Total: 168 mg/dL (ref 100–199)
HDL: 69 mg/dL (ref >39)
LDL Chol Calc (NIH): 89 mg/dL (ref 0–99)
Triglycerides: 49 mg/dL (ref 0–149)
VLDL Cholesterol Cal: 10 mg/dL (ref 5–40)

## 2020-08-02 ENCOUNTER — Other Ambulatory Visit: Payer: Self-pay

## 2020-08-02 ENCOUNTER — Ambulatory Visit (INDEPENDENT_AMBULATORY_CARE_PROVIDER_SITE_OTHER): Payer: 59 | Admitting: Cardiovascular Disease

## 2020-08-02 ENCOUNTER — Encounter: Payer: Self-pay | Admitting: Cardiovascular Disease

## 2020-08-02 DIAGNOSIS — E781 Pure hyperglyceridemia: Secondary | ICD-10-CM

## 2020-08-02 DIAGNOSIS — R0789 Other chest pain: Secondary | ICD-10-CM

## 2020-08-02 DIAGNOSIS — Z8669 Personal history of other diseases of the nervous system and sense organs: Secondary | ICD-10-CM

## 2020-08-02 DIAGNOSIS — Z8616 Personal history of COVID-19: Secondary | ICD-10-CM | POA: Diagnosis not present

## 2020-08-02 NOTE — Progress Notes (Signed)
Cardiology Office Note    Date:  08/04/2020   ID:  ELVIRA Wolfe, DOB Apr 30, 1976, MRN [id number]  PCP:  Biagio Borg, MD  Cardiologist:  Shelva Majestic, MD   No chief complaint on file.  F/U cardiology evaluation initially referred through the courtesy of Dr. Lamonte Sakai for evaluation of chest pain.  History of Present Illness:  Lauren Wolfe is a 44 y.o. female who presents for 86-monthfollow-up Cardiologic evaluation.   Ms. GAlinda Moneyis followed by Dr. JCathlean Cowerfor primary care.  She was diagnosed with COVID-19 pneumonia in January 2021.  Symptoms were significant with fever to 104 and marked fatigability.  Subsequently she has developed chest pain symptomatology.  Her chest pain does not have aches any exertional component to it.  Can happen anytime throughout the day typically described as sharp, at times continuous, can last for days.  It is consistently been nonexertional.  She experiences some occasional shortness of breath with exertion which has slightly improved.  Because of her recurrent chest pain symptomatology she is referred for cardiac evaluation.  Her history is notable for remote right upper extremity DVT.  She has a history of migraine headaches and [facility]  She has been treated with meloxicam and gabapentin.  She continues to experience fatigue.  She has a history of obesity but admits to an 8 pound weight loss over the last month.    When I saw her for initial evaluation on January 19, 2020 I felt her chest pain was noncardiac in etiology.  She describes it as sharp, at times continuous, and at times can last for days and was characteristically nonexertional.  She admitted to some exertional shortness of breath.  I recommended she undergo an echo Doppler study.  I also discussed her significant hyperlipidemia on prior laboratory assessment.  An echo Doppler study on Feb 05, 2020 showed hyperdynamic LV function with EF estimated 65 to 70%.   There was grade 1 diastolic dysfunction.  There was normal strain.  I last saw her on 02/19/2020 at which time she denied any recurrent chest pain or exertional shortness of breath.  She was trying to lose weight and had significantly adjusted her diet.  During that evaluation I reviewed her echo Doppler study.  She was on vitamin D supplementation and I discussed the importance of weight loss and exercise.  Since I last saw her, she is remained stable from a cardiac standpoint.  She has continued to lose weight and has lost 28 pounds since my initial evaluation.  She feels well.  She presents for follow-up assessment.  Past Medical History:  Diagnosis Date  . Anxiety   . Arm vein blood clot, right   . Knee pain, left 01/2019  . Migraine   . Wears glasses    and contacts    Past Surgical History:  Procedure Laterality Date  . BREAST BIOPSY  10/31/2012   Procedure: BREAST BIOPSY WITH NEEDLE LOCALIZATION;  Surgeon: MRolm Bookbinder MD;  [place]  Service: General;  Laterality: Right;  right breast wire guided excisional biopsy  . BREAST EXCISIONAL BIOPSY Right 2014  . ganglion cysts  2011  . WRIST SURGERY Left     Current Medications: Outpatient Medications Prior to Visit  Medication Sig Dispense Refill  . cyclobenzaprine (FLEXERIL) 10 MG tablet daily as needed.    . Diclofenac Potassium,Migraine, 50 MG PACK Take 50 mg by mouth as needed.  10 each 5  . levonorgestrel (MIRENA) 20 MCG/24HR IUD 1 each by Intrauterine route once.    . ondansetron (ZOFRAN) 4 MG tablet Take 1 tablet (4 mg total) by mouth every 8 (eight) hours as needed for nausea or vomiting. 20 tablet 5  . Rimegepant Sulfate (NURTEC) 75 MG TBDP Take 75 mg by mouth as needed (take 1 tablet for acute headache, max is 75 mg/24 hours). 8 tablet 11  . rosuvastatin (CRESTOR) 20 MG tablet Take 1 tablet (20 mg total) by mouth daily. 90 tablet 3  . SUMAtriptan (IMITREX) 6 MG/0.5ML SOLN injection One  injection at onset of migraine.  May repeat in 2 hrs, if needed.  Max dose: 2 inj/day. This is a 30 day prescription. (Patient taking differently: Inject 6 mg into the skin every 2 (two) hours as needed for migraine (One injection at onset.  May repeat in 2 hrs, if needed.  Max dose: 2 inj/day.). ) 5 mL 11  . tiZANidine (ZANAFLEX) 4 MG tablet Take 1 tablet (4 mg total) by mouth every 8 (eight) hours as needed (For migraines.). 30 tablet 5  . triamcinolone cream (KENALOG) 0.1 % APPLY TO AFFECTED AREA TWICE A DAY 30 g 2  . Vitamin D, Ergocalciferol, (DRISDOL) 1.25 MG (50000 UNIT) CAPS capsule Take 1 capsule (50,000 Units total) by mouth every 7 (seven) days. 4 capsule 2  . clobetasol cream (TEMOVATE) 3.57 % Apply 1 application topically 2 (two) times daily. 30 g 2   No facility-administered medications prior to visit.     Allergies:   Azithromycin, Codeine, Hydrocodone, Ketorolac tromethamine, Prednisone, and Tramadol   Social History   Socioeconomic History  . Marital status: Single    Spouse name: Not on file  . Number of children: 1  . Years of education: some college  . Highest education level: Not on file  Occupational History  . 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 Smoking status: Never Smoker  . Smokeless tobacco: Never Used  Vaping Use  . Vaping Use: Never used  Substance and Sexual Activity  . Alcohol use: No  . Drug use: No  . Sexual activity: Yes  Other Topics Concern  . Not on file  Social History Narrative   Lives at home with her daughter.   Right-handed.   No caffeine use.   Social Determinants of Health   Financial Resource Strain:   . Difficulty of Paying Living Expenses: Not on file  Food Insecurity:   . Worried About Charity fundraiser in the Last Year: Not on file  . Ran Out of Food in the Last Year: Not on file  Transportation Needs:   . Lack of Transportation (Medical): Not on file  . Lack of Transportation (Non-Medical): Not on file   Physical Activity:   . Days of Exercise per Week: Not on file  . Minutes of Exercise per Session: Not on file  Stress:   . Feeling of Stress : Not on file  Social Connections:   . Frequency of Communication with Friends and Family: Not on file  . Frequency of Social Gatherings with Friends and Family: Not on file  . Attends Religious Services: Not on file  . Active Member of Clubs or Organizations: Not on file  . Attends Archivist Meetings: Not on file  . Marital Status: Not on file    Additional social history is notable that she is single and has 1 child a daughter age  24.  There is no history of tobacco use.  She attended [employer].  She currently works at [employer] as an Web designer.  Family History:  The patient's family history includes Aneurysm in her mother; Breast cancer in her cousin, maternal grandmother, and another family member; Diabetes in an other family member; Gout in an other family member; Hypertension in her mother and another family member; Migraines in her paternal aunt; Other in an other family member; Stomach cancer in her father.  Her mother died at age 35 and hypertension unfortunately died secondary to a car accident.  Her father is living at age 32 and has stage IV colon cancer.  ROS General: Negative; No fevers, chills, or night sweats; positive fatigability HEENT: Negative; No changes in vision or hearing, sinus congestion, difficulty swallowing Pulmonary: Covid pneumonia completed a course of remdesivir in January 2021 Cardiovascular: See HPI GI: Negative; No nausea, vomiting, diarrhea, or abdominal pain GU: Negative; No dysuria, hematuria, or difficulty voiding Musculoskeletal: Negative; no myalgias, joint pain, or weakness Hematologic/Oncology: Negative; no easy bruising, bleeding Endocrine: Negative; no heat/cold intolerance; no diabetes Neuro: Positive for migraine headaches Skin: Negative; No  rashes or skin lesions Psychiatric: Negative; No behavioral problems, depression Sleep: Negative; No snoring, daytime sleepiness, hypersomnolence, bruxism, restless legs, hypnogognic hallucinations, no cataplexy Other comprehensive 14 point system review is negative.   PHYSICAL EXAM:   VS:  BP 128/88   Pulse 69   Ht 5' 6"  (1.676 m)   Wt 183 lb (83 kg)   BMI 29.54 kg/m     Repeat blood pressure by me was 122/78  Wt Readings from Last 3 Encounters:  08/02/20 183 lb (83 kg)  05/12/20 192 lb (87.1 kg)  05/03/20 193 lb (87.5 kg)    General: Alert, oriented, no distress.  Skin: normal turgor, no rashes, warm and dry HEENT: Normocephalic, atraumatic. Pupils equal round and reactive to light; sclera anicteric; extraocular muscles intact;  Nose without nasal septal hypertrophy Mouth/Parynx benign; Mallinpatti scale 3 Neck: No JVD, no carotid bruits; normal carotid upstroke Lungs: clear to ausculatation and percussion; no wheezing or rales Chest wall: without tenderness to palpitation Heart: PMI not displaced, RRR, s1 s2 normal, 1/6 systolic murmur, no diastolic murmur, no rubs, gallops, thrills, or heaves Abdomen: soft, nontender; no hepatosplenomehaly, BS+; abdominal aorta nontender and not dilated by palpation. Back: no CVA tenderness Pulses 2+ Musculoskeletal: full range of motion, normal strength, no joint deformities Extremities: no clubbing cyanosis or edema, Homan's sign negative  Neurologic: grossly nonfocal; Cranial nerves grossly wnl Psychologic: Normal mood and affect  Studies/Labs Reviewed:   EKG:  EKG is ordered today.  ECG (independently read by me): NSR at 69, no ectopy, normal inervals  Feb 19, 2020 ECG (independently read by me): NSR at 68; no ectopy, normal intervals  October 27, 2020ECG (independently read by me): Normal sinus rhythm at a 67 bpm.  Nondiagnostic T wave abnormality in lead III, V1 and V2.  Recent Labs: BMP Latest Ref Rng & Units 07/30/2020  03/30/2020 12/17/2019  Glucose 65 - 99 mg/dL 86 88 89  BUN 6 - 24 mg/dL 10 12 13   Creatinine 0.57 - 1.00 mg/dL 1.02(H) 1.10(H) 0.84  BUN/Creat Ratio 9 - 23 10 11  -  Sodium 134 - 144 mmol/L 137 140 138  Potassium 3.5 - 5.2 mmol/L 4.3 4.9 3.9  Chloride 96 - 106 mmol/L 101 103 102  CO2 20 - 29 mmol/L 25 22 29   Calcium 8.7 - 10.2  mg/dL 9.5 9.9 9.3     Hepatic Function Latest Ref Rng & Units 07/30/2020 03/30/2020 12/17/2019  Total Protein 6.0 - 8.5 g/dL 7.1 7.3 7.3  Albumin 3.8 - 4.8 g/dL 4.4 4.5 4.2  AST 0 - 40 IU/L 18 16 15   ALT 0 - 32 IU/L 11 12 18   Alk Phosphatase 44 - 121 IU/L 88 93 66  Total Bilirubin 0.0 - 1.2 mg/dL 0.4 0.5 0.4  Bilirubin, Direct 0.0 - 0.3 mg/dL - - 0.1    CBC Latest Ref Rng & Units 12/17/2019 11/11/2019 11/11/2019  WBC 4.0 - 10.5 K/uL 6.2 6.0 6.5  Hemoglobin 12.0 - 15.0 g/dL 14.6 14.8 13.8  Hematocrit 36 - 46 % 43.6 45.6 42.5  Platelets 150 - 400 K/uL 269.0 266 231   Lab Results  Component Value Date   MCV 90.3 12/17/2019   MCV 91.8 11/11/2019   MCV 92.4 11/11/2019   Lab Results  Component Value Date   TSH 1.97 12/17/2019   No results found for: HGBA1C   BNP No results found for: BNP  ProBNP No results found for: PROBNP   Lipid Panel     Component Value Date/Time   CHOL 168 07/30/2020 1051   TRIG 49 07/30/2020 1051   HDL 69 07/30/2020 1051   CHOLHDL 2.4 07/30/2020 1051   CHOLHDL 4 12/17/2019 0926   VLDL 22.8 12/17/2019 0926   LDLCALC 89 07/30/2020 1051   LDLDIRECT 164.5 09/19/2012 1052   LABVLDL 10 07/30/2020 1051     RADIOLOGY: No results found.   Additional studies/ records that were reviewed today include:  I reviewed the patient's records from of our pulmonary as well as her hospitalization  ECHO 02/05/2020 IMPRESSIONS  1. Left ventricular ejection fraction, by estimation, is 65 to 70%. The  left ventricle has normal function. The left ventricle has no regional  wall motion abnormalities. Left ventricular diastolic parameters  are  consistent with Grade I diastolic  dysfunction (impaired relaxation). The average left ventricular global  longitudinal strain is -22.5 %.  2. Right ventricular systolic function is normal. The right ventricular  size is normal.  3. The mitral valve is normal in structure. No evidence of mitral valve  regurgitation. No evidence of mitral stenosis.  4. The aortic valve is normal in structure. Aortic valve regurgitation is  not visualized. No aortic stenosis is present.   ASSESSMENT:    1. Atypical chest pain   2. Pure hypertriglyceridemia   3. History of COVID-19   4. History of migraine headaches    PLAN:  Ms. Dosha Broshears is a 44 year old African-American female who has a remote history for right upper extremity DVT and developed Covid in January 2021 associated with fevers up to 104 cough and marked fatigability.  She completed a full course of remdesivir.  Subsequent chest x-rays have not shown acute abnormality and repeat CT imaging in February 2021 did not show evidence for pulmonary embolism and showed mild right middle lobe, lingular and left lower lobe atelectasis which was improved from January.  When I saw her for initial evaluation, she had noticed some episodes of nonexertional chest pain which initiated 2 weeks after her hospitalization.  Her pain was atypical for ischemic heart disease and was described as sharp, at times continuous, and at times can last for days.  Since she had suffered Covid, I recommended she undergo a follow-up echo Doppler study to make certain she did not have any delayed myocarditis or subsequent development of LV dysfunction.  Her echo Doppler study demonstrated an EF of 65 to 70%.  There was grade 1 diastolic dysfunction.  She had normal valvular architecture.  She has hyperlipidemia on laboratory from March 2017 revealed an LDL cholesterol elevated at 150. I discussed with her the atherogenic potential of that LDL level.  She has significantly  adjusted her diet.  Presently, her blood pressure today is stable.  She is now on rosuvastatin 20 mg but admits that she has only been taking this approximately 3 times per week.  She did have significant change in her diet and I commended her on her 28 pound weight loss since my initial evaluation.  Repeat lipid studies on October 29 were markedly improved with a total cholesterol now at 168 from 232 and her LDL cholesterol at 89, significantly reduced from 156.  She does not have established CAD.  I suspect if she was taking her rosuvastatin daily her LDL would be less than 70.  I discussed the importance of continued exercise and weight loss if at all possible.  She will be following up with Dr. Jenny Reichmann.  She sees Dr. Evelena Leyden of neurology who follows her chronic migraine and has been on meloxicam and gabapentin.  I will see her in 1 year for follow-up evaluation or sooner as needed.   Medication Adjustments/Labs and Tests Ordered: Current medicines are reviewed at length with the patient today.  Concerns regarding medicines are outlined above.  Medication changes, Labs and Tests ordered today are listed in the Patient Instructions below. Patient Instructions  Medication Instructions:  Your physician recommends that you continue on your current medications as directed. Please refer to the Current Medication list given to you today.  *If you need a refill on your cardiac medications before your next appointment, please call your pharmacy*  Follow-Up: At [facility], you and your health needs are our priority.  As part of our continuing mission to provide you with exceptional heart care, we have created designated Provider Care Teams.  These Care Teams include your primary Cardiologist (physician) and Advanced Practice Providers (APPs -  Physician Assistants and Nurse Practitioners) who all work together to provide you with the care you need, when you need it.  We recommend signing up for the patient  portal called "MyChart".  Sign up information is provided on this After Visit Summary.  MyChart is used to connect with patients for Virtual Visits (Telemedicine).  Patients are able to view lab/test results, encounter notes, upcoming appointments, etc.  Non-urgent messages can be sent to your provider as well.   To learn more about what you can do with MyChart, go to [email or web address].    Your next appointment:   12 month(s)  The format for your next appointment:   In Person  Provider:   Shelva Majestic, MD         Signed, Shelva Majestic, MD  08/04/2020 6:34 PM    [facility] [address] [place], [place], [place]  [address] Phone: [phone]

## 2020-08-02 NOTE — Patient Instructions (Signed)
Medication Instructions:  Your physician recommends that you continue on your current medications as directed. Please refer to the Current Medication list given to you today.  *If you need a refill on your cardiac medications before your next appointment, please call your pharmacy*  Follow-Up: At [facility], you and your health needs are our priority.  As part of our continuing mission to provide you with exceptional heart care, we have created designated Provider Care Teams.  These Care Teams include your primary Cardiologist (physician) and Advanced Practice Providers (APPs -  Physician Assistants and Nurse Practitioners) who all work together to provide you with the care you need, when you need it.  We recommend signing up for the patient portal called "MyChart".  Sign up information is provided on this After Visit Summary.  MyChart is used to connect with patients for Virtual Visits (Telemedicine).  Patients are able to view lab/test results, encounter notes, upcoming appointments, etc.  Non-urgent messages can be sent to your provider as well.   To learn more about what you can do with MyChart, go to [email or web address].    Your next appointment:   12 month(s)  The format for your next appointment:   In Person  Provider:   Shelva Majestic, MD

## 2020-08-04 ENCOUNTER — Encounter: Payer: Self-pay | Admitting: Cardiovascular Disease

## 2020-08-30 ENCOUNTER — Other Ambulatory Visit: Payer: Self-pay | Admitting: Neurology

## 2020-09-01 ENCOUNTER — Other Ambulatory Visit: Payer: Self-pay | Admitting: Neurology

## 2020-09-08 ENCOUNTER — Other Ambulatory Visit: Payer: Self-pay | Admitting: Neurology

## 2020-11-03 ENCOUNTER — Ambulatory Visit (INDEPENDENT_AMBULATORY_CARE_PROVIDER_SITE_OTHER): Payer: 59 | Admitting: Neurology

## 2020-11-03 ENCOUNTER — Encounter: Payer: Self-pay | Admitting: Neurology

## 2020-11-03 VITALS — BP 133/86 | HR 75 | Ht 66.0 in | Wt 186.0 lb

## 2020-11-03 DIAGNOSIS — G43811 Other migraine, intractable, with status migrainosus: Secondary | ICD-10-CM | POA: Diagnosis not present

## 2020-11-03 MED ORDER — TIZANIDINE HCL 4 MG PO TABS: 4.0000 mg | ORAL_TABLET | Freq: Three times a day (TID) | ORAL | 1 refills | Status: DC | PRN

## 2020-11-03 MED ORDER — DICLOFENAC POTASSIUM(MIGRAINE) 50 MG PO PACK: 50.0000 mg | PACK | ORAL | 5 refills | Status: DC | PRN

## 2020-11-03 MED ORDER — NURTEC 75 MG PO TBDP: 75.0000 mg | ORAL_TABLET | ORAL | 11 refills | Status: DC | PRN

## 2020-11-03 MED ORDER — ONDANSETRON 4 MG PO TBDP: 4.0000 mg | ORAL_TABLET | Freq: Three times a day (TID) | ORAL | 3 refills | Status: DC | PRN
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
## 2020-11-03 NOTE — Patient Instructions (Signed)
Continue current medications Refills sent  See you back in 1 year

## 2020-11-04 NOTE — Progress Notes (Signed)
I have reviewed and agreed above plan. 

## 2020-12-12 IMAGING — CR DG CHEST 2V
2 series · 2 of 2 positions shown · non-contrast
Comparison: November 11, 2019

CLINICAL DATA: Chest pain

EXAM:
CHEST - 2 VIEW

[chest pa]
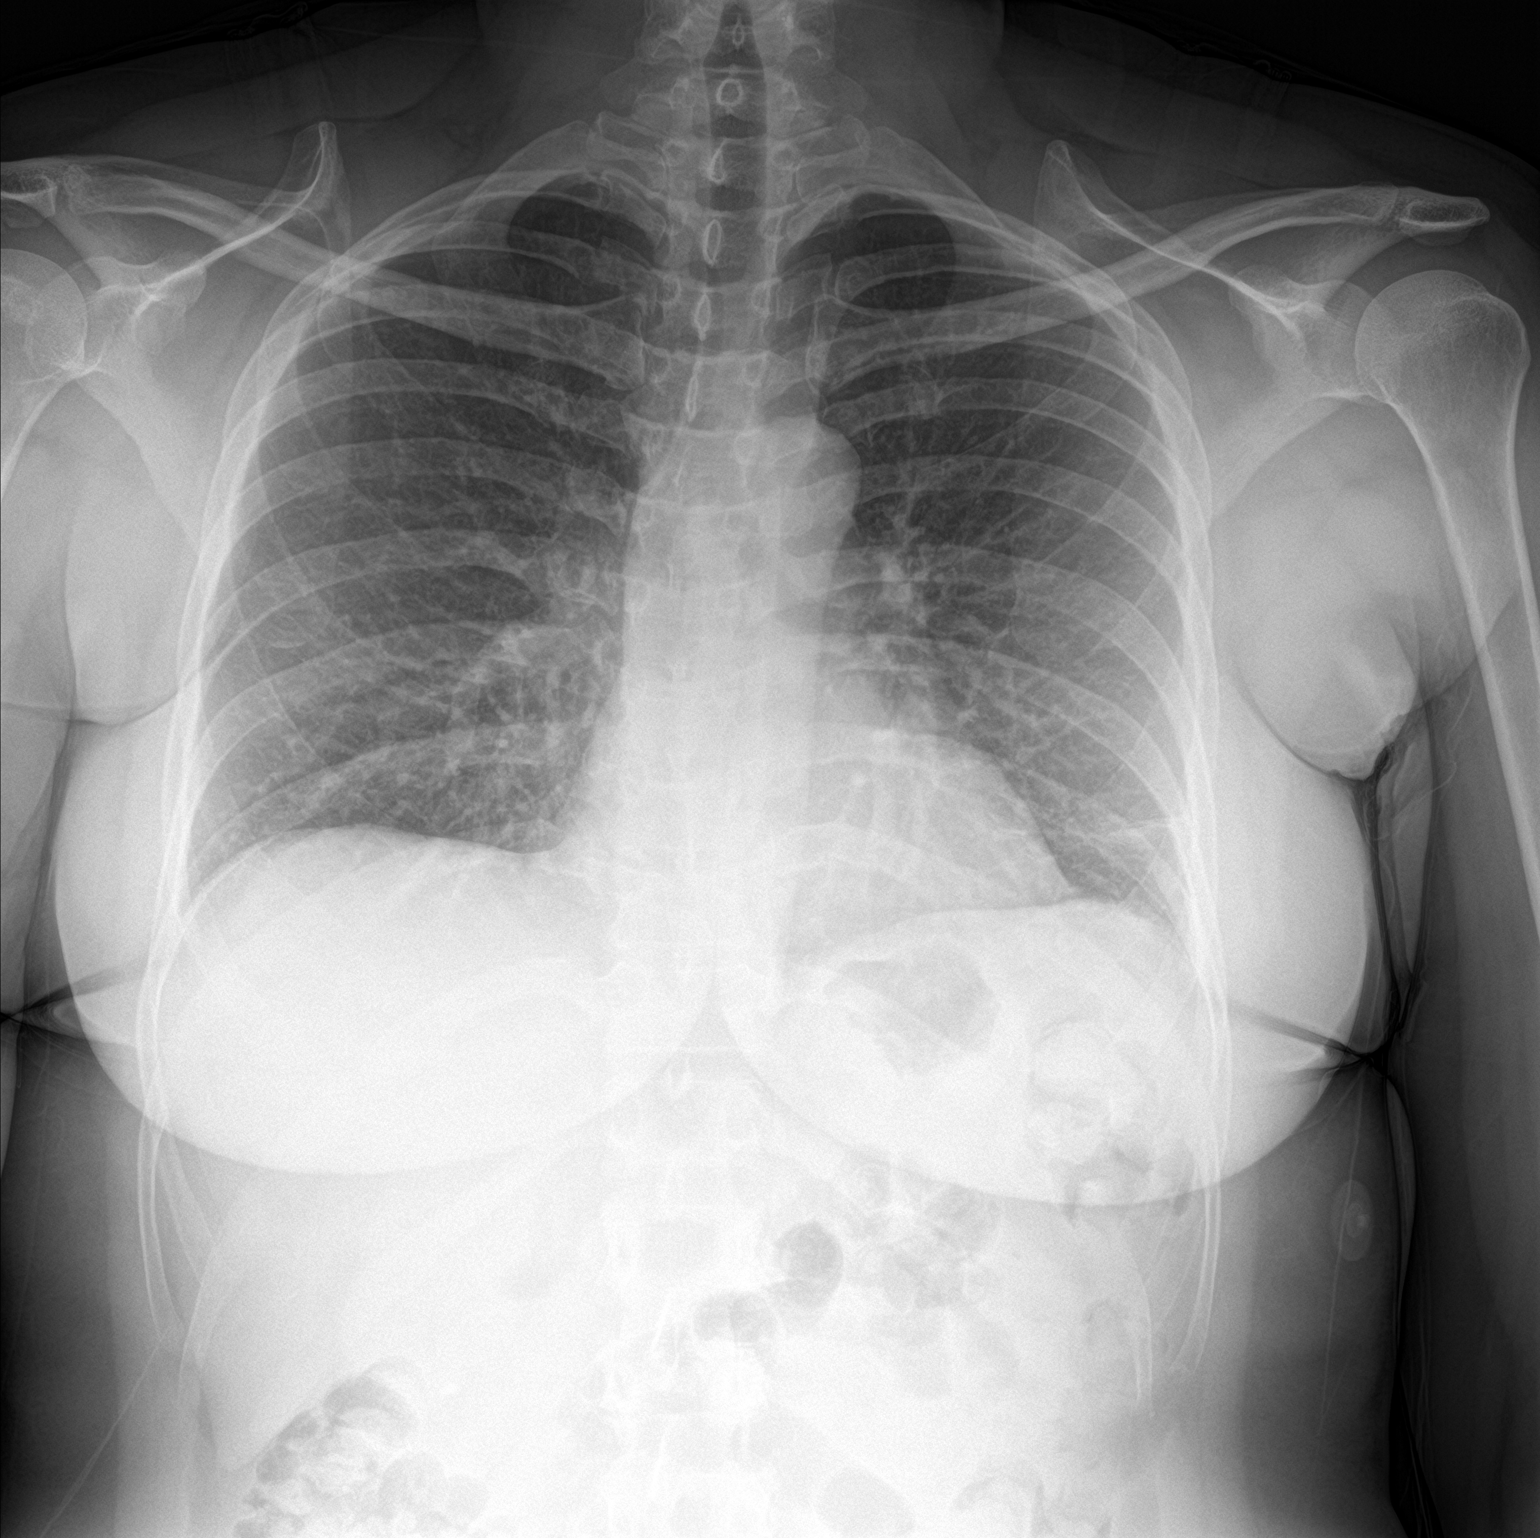

[chest lat]
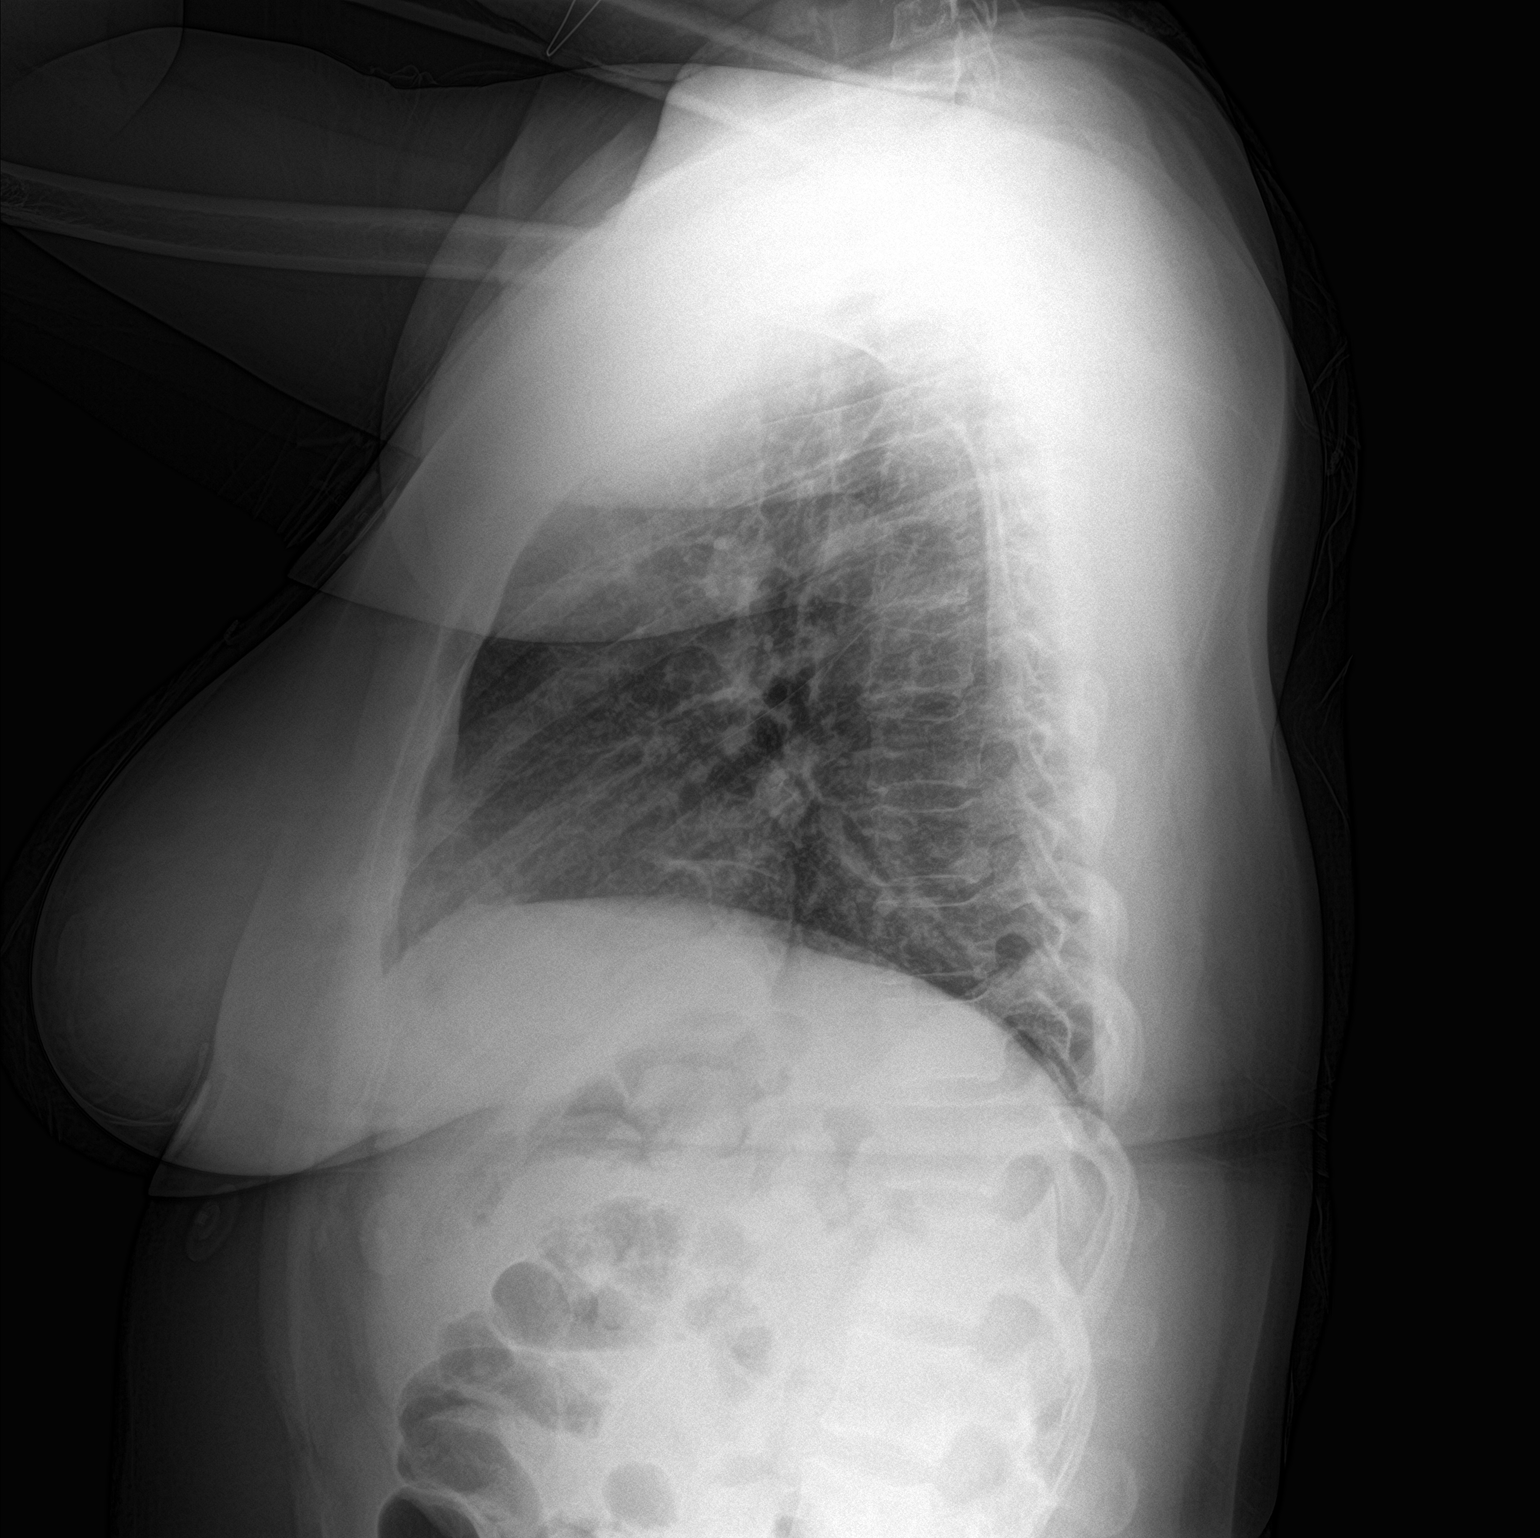

[2 of 2 positions shown; findings below may reference images not displayed]

FINDINGS: The heart size and mediastinal contours are within normal limits.
Both lungs are clear. The visualized skeletal structures are
unremarkable.
IMPRESSION: No active cardiopulmonary disease.

## 2020-12-12 IMAGING — CR DG CHEST 2V
2 series · 2 of 2 positions shown · non-contrast
Comparison: 10/30/2019

CLINICAL DATA: Chest pain

EXAM:
CHEST - 2 VIEW

[w chest pa]
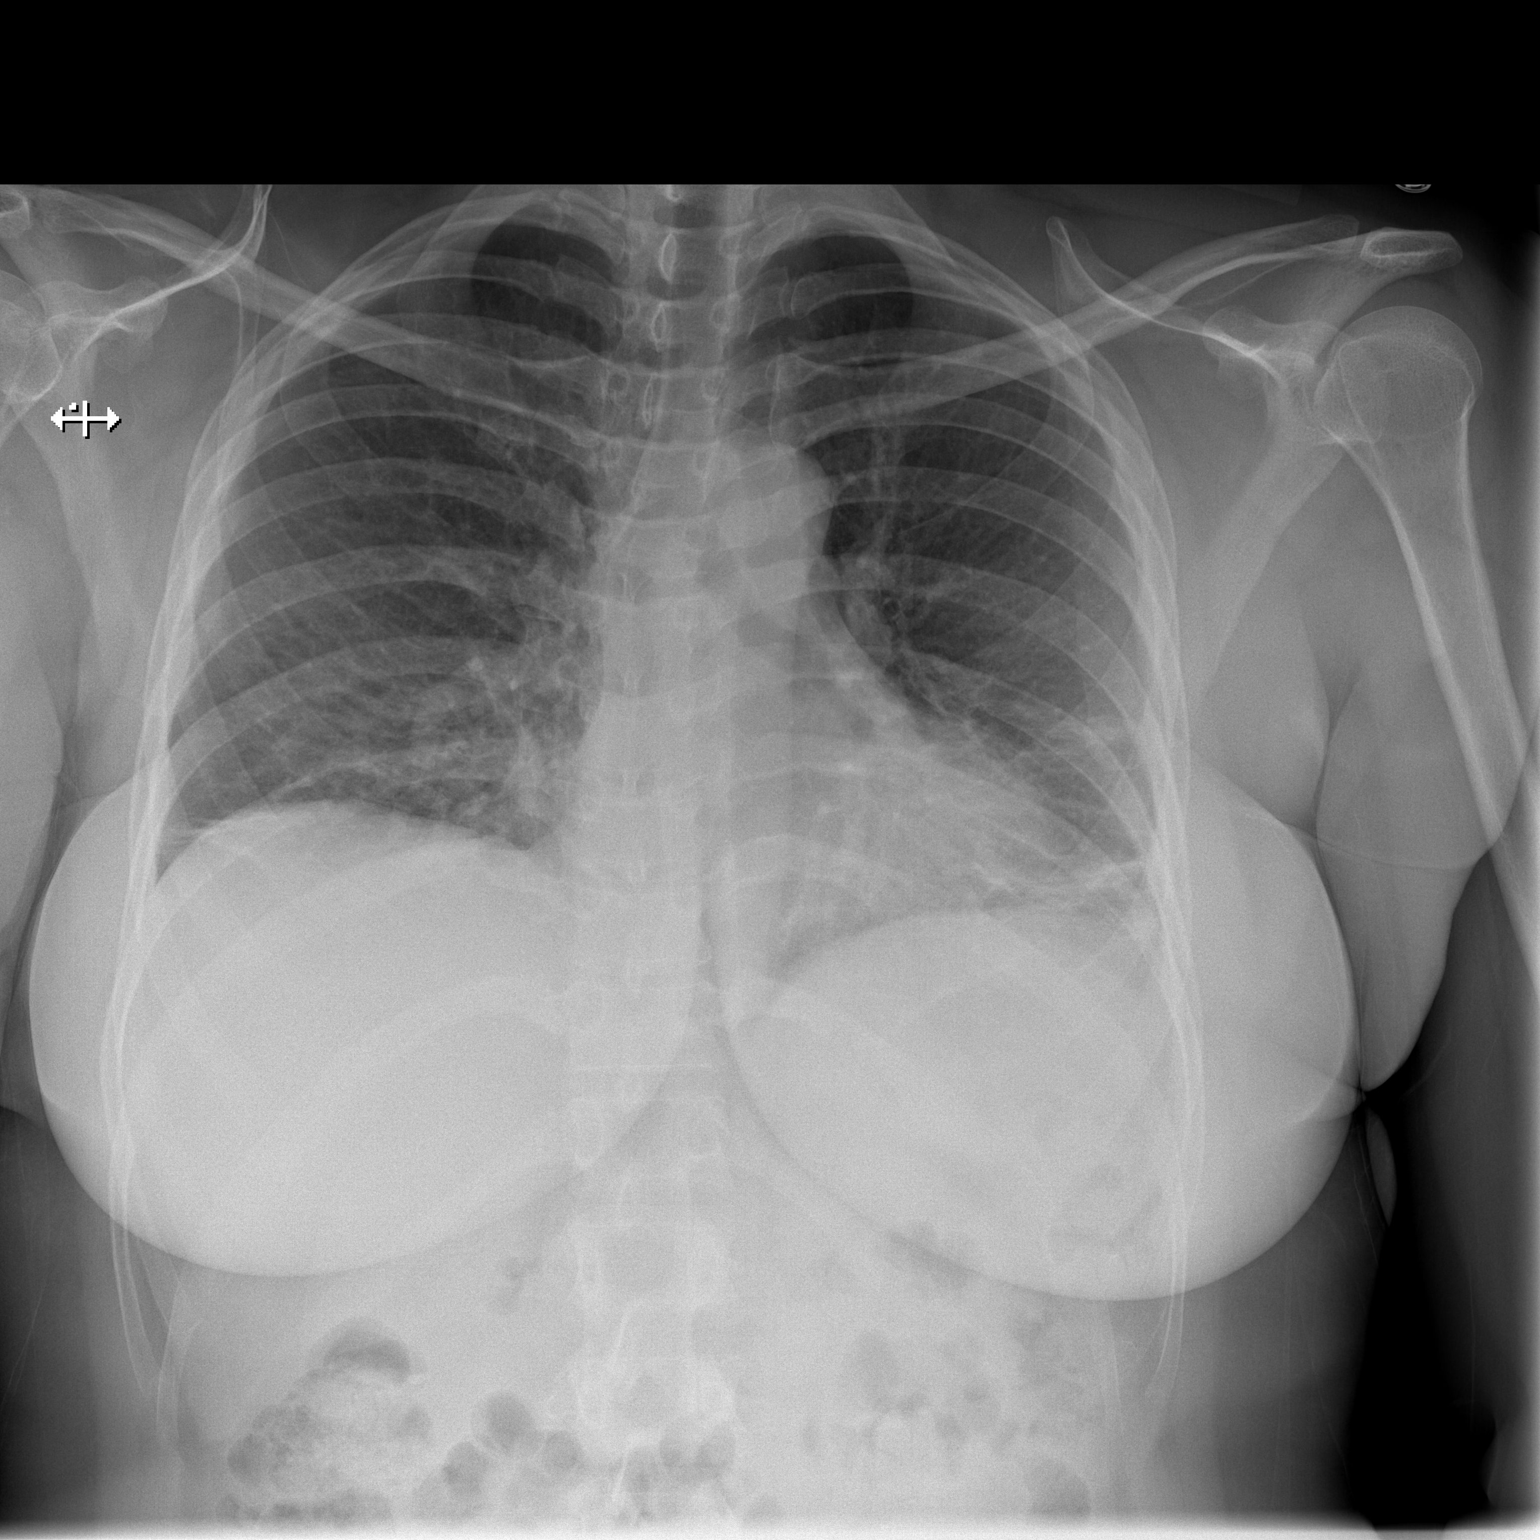

[w chest lat]
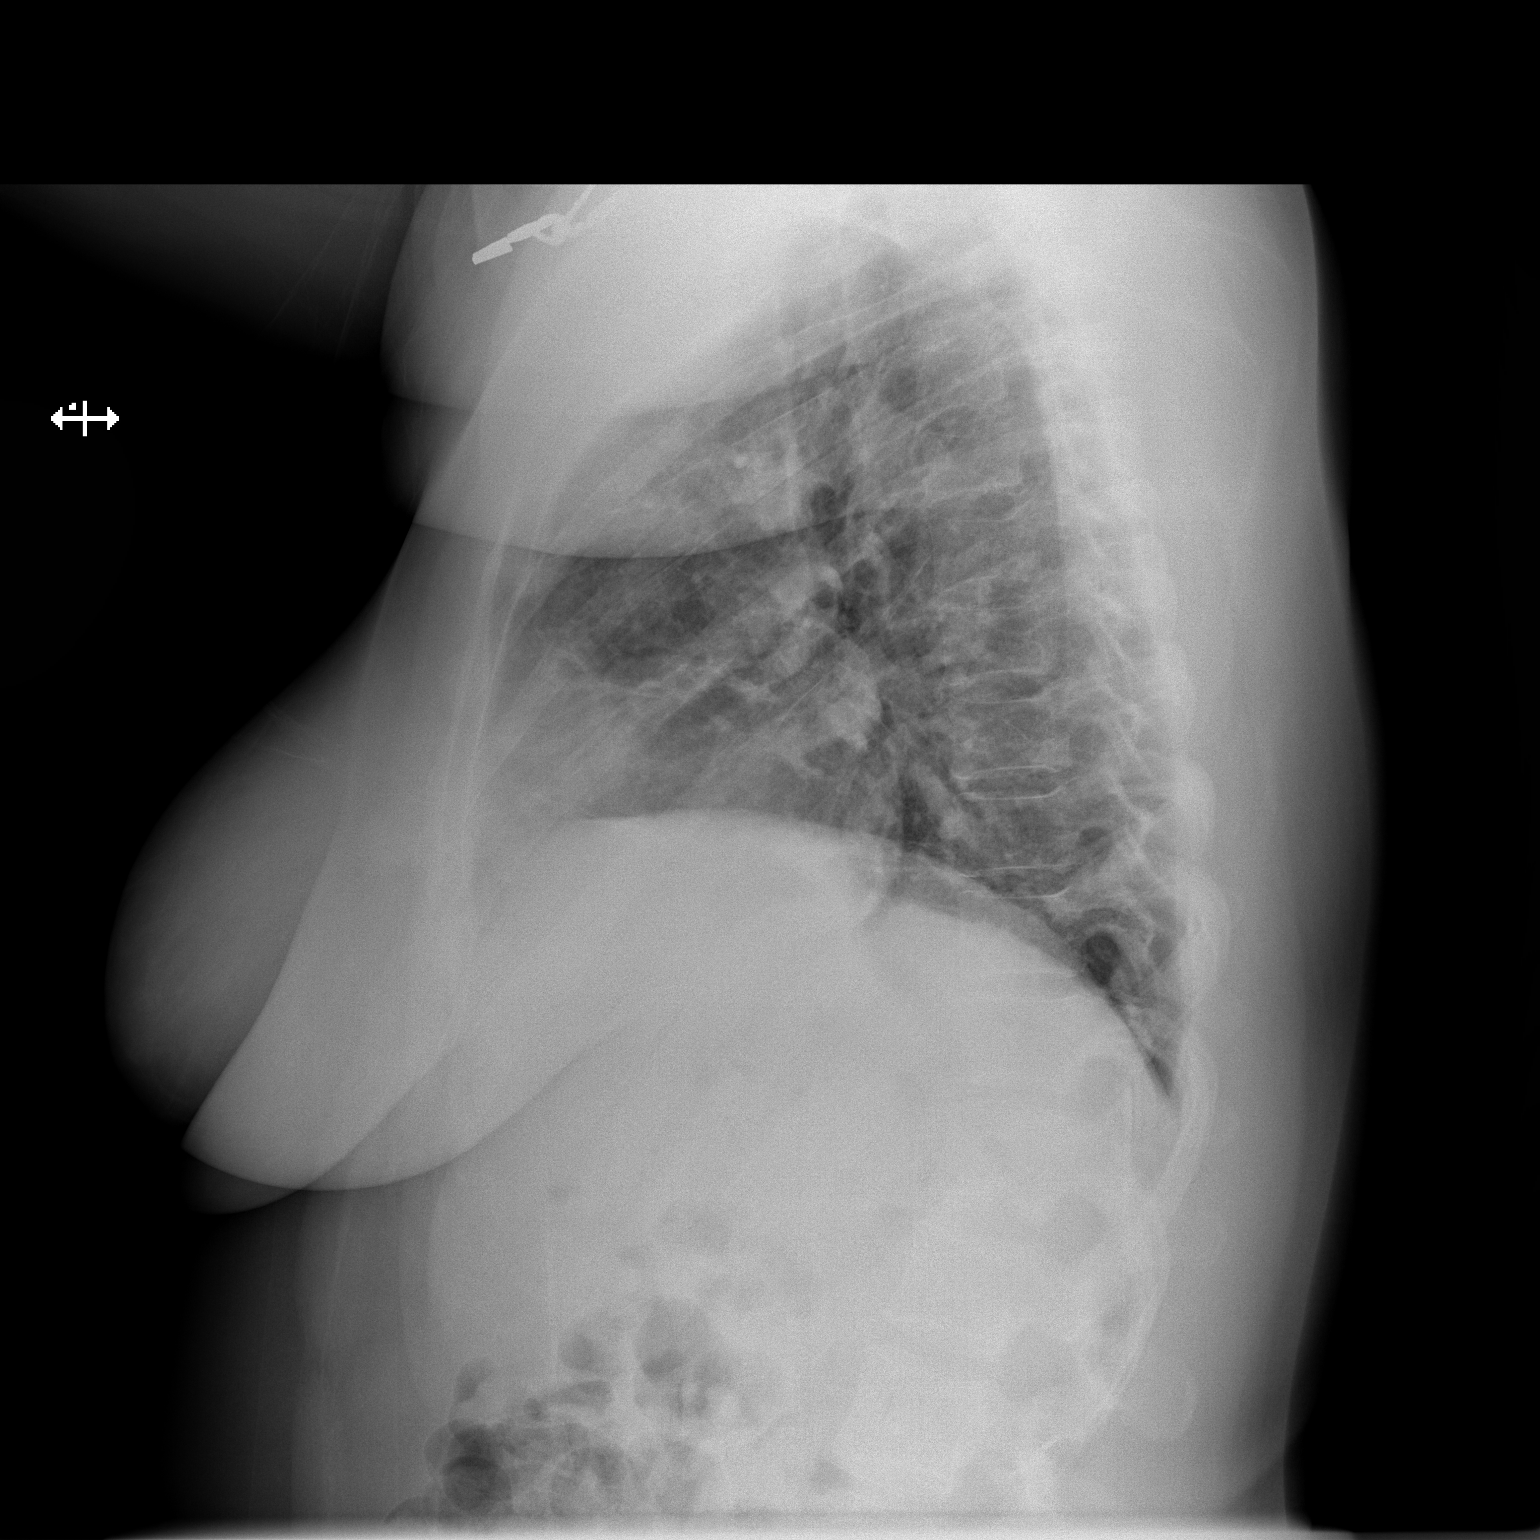

[2 of 2 positions shown; findings below may reference images not displayed]

FINDINGS: Low volume chest with streaky density on both sides that is stable.
Normal heart size and aortic contours. No edema, effusion, or
pneumothorax.
IMPRESSION: Stable low volume chest with streaky opacity on both sides that
could be atelectasis or scarring.

## 2020-12-17 ENCOUNTER — Ambulatory Visit: Payer: 59 | Admitting: Internal Medicine

## 2020-12-21 ENCOUNTER — Other Ambulatory Visit: Payer: Self-pay

## 2020-12-22 ENCOUNTER — Ambulatory Visit (INDEPENDENT_AMBULATORY_CARE_PROVIDER_SITE_OTHER): Payer: 59 | Admitting: Internal Medicine

## 2020-12-22 ENCOUNTER — Encounter: Payer: Self-pay | Admitting: Internal Medicine

## 2020-12-22 VITALS — BP 138/82 | HR 72 | Temp 98.6°F | Ht 66.0 in | Wt 190.0 lb

## 2020-12-22 DIAGNOSIS — E559 Vitamin D deficiency, unspecified: Secondary | ICD-10-CM

## 2020-12-22 DIAGNOSIS — Z1159 Encounter for screening for other viral diseases: Secondary | ICD-10-CM | POA: Diagnosis not present

## 2020-12-22 DIAGNOSIS — E7800 Pure hypercholesterolemia, unspecified: Secondary | ICD-10-CM | POA: Diagnosis not present

## 2020-12-22 DIAGNOSIS — Z0001 Encounter for general adult medical examination with abnormal findings: Secondary | ICD-10-CM

## 2020-12-22 DIAGNOSIS — I10 Essential (primary) hypertension: Secondary | ICD-10-CM

## 2020-12-22 DIAGNOSIS — Z0000 Encounter for general adult medical examination without abnormal findings: Secondary | ICD-10-CM | POA: Diagnosis not present

## 2020-12-22 DIAGNOSIS — I82629 Acute embolism and thrombosis of deep veins of unspecified upper extremity: Secondary | ICD-10-CM | POA: Insufficient documentation

## 2020-12-22 LAB — HEPATIC FUNCTION PANEL
ALT: 12 U/L (ref 0–35)
AST: 15 U/L (ref 0–37)
Albumin: 4.5 g/dL (ref 3.5–5.2)
Alkaline Phosphatase: 69 U/L (ref 39–117)
Bilirubin, Direct: 0.1 mg/dL (ref 0.0–0.3)
Total Bilirubin: 0.6 mg/dL (ref 0.2–1.2)
Total Protein: 7.3 g/dL (ref 6.0–8.3)

## 2020-12-22 LAB — URINALYSIS, ROUTINE W REFLEX MICROSCOPIC
Bilirubin Urine: NEGATIVE
Hgb urine dipstick: NEGATIVE
Ketones, ur: NEGATIVE
Leukocytes,Ua: NEGATIVE
Nitrite: NEGATIVE
RBC / HPF: NONE SEEN (ref 0–?)
Specific Gravity, Urine: 1.015 (ref 1.000–1.030)
Total Protein, Urine: NEGATIVE
Urine Glucose: NEGATIVE
Urobilinogen, UA: 0.2 (ref 0.0–1.0)
pH: 7 (ref 5.0–8.0)

## 2020-12-22 LAB — CBC WITH DIFFERENTIAL/PLATELET
Basophils Absolute: 0.1 10*3/uL (ref 0.0–0.1)
Basophils Relative: 0.7 % (ref 0.0–3.0)
Eosinophils Absolute: 0.1 10*3/uL (ref 0.0–0.7)
Eosinophils Relative: 0.9 % (ref 0.0–5.0)
HCT: 43.7 % (ref 36.0–46.0)
Hemoglobin: 14.7 g/dL (ref 12.0–15.0)
Lymphocytes Relative: 30.3 % (ref 12.0–46.0)
Lymphs Abs: 2.2 10*3/uL (ref 0.7–4.0)
MCHC: 33.5 g/dL (ref 30.0–36.0)
MCV: 90.1 fl (ref 78.0–100.0)
Monocytes Absolute: 0.6 10*3/uL (ref 0.1–1.0)
Monocytes Relative: 8.1 % (ref 3.0–12.0)
Neutro Abs: 4.3 10*3/uL (ref 1.4–7.7)
Neutrophils Relative %: 60 % (ref 43.0–77.0)
Platelets: 258 10*3/uL (ref 150.0–400.0)
RBC: 4.85 Mil/uL (ref 3.87–5.11)
RDW: 12.9 % (ref 11.5–15.5)
WBC: 7.2 10*3/uL (ref 4.0–10.5)

## 2020-12-22 LAB — LIPID PANEL
Cholesterol: 177 mg/dL (ref 0–200)
HDL: 76.6 mg/dL (ref >39.00)
LDL Cholesterol: 90 mg/dL (ref 0–99)
NonHDL: 100.88
Total CHOL/HDL Ratio: 2
Triglycerides: 55 mg/dL (ref 0.0–149.0)
VLDL: 11 mg/dL (ref 0.0–40.0)

## 2020-12-22 LAB — BASIC METABOLIC PANEL
BUN: 11 mg/dL (ref 6–23)
CO2: 29 mEq/L (ref 19–32)
Calcium: 9.5 mg/dL (ref 8.4–10.5)
Chloride: 104 mEq/L (ref 96–112)
Creatinine, Ser: 0.86 mg/dL (ref 0.40–1.20)
GFR: 82.09 mL/min (ref >60.00)
Glucose, Bld: 84 mg/dL (ref 70–99)
Potassium: 3.7 mEq/L (ref 3.5–5.1)
Sodium: 139 mEq/L (ref 135–145)

## 2020-12-22 LAB — VITAMIN D 25 HYDROXY (VIT D DEFICIENCY, FRACTURES): VITD: 27.23 ng/mL — ABNORMAL LOW (ref 30.00–100.00)

## 2020-12-22 LAB — TSH: TSH: 2.09 u[IU]/mL (ref 0.35–4.50)

## 2020-12-22 MED ORDER — TRIAMCINOLONE ACETONIDE 0.1 % EX CREA: TOPICAL_CREAM | CUTANEOUS | 2 refills | Status: DC

## 2020-12-22 NOTE — Progress Notes (Deleted)
Patient ID: Lauren Wolfe, female   DOB: 01-Mar-1976, 45 y.o.   MRN: [id number]         Chief Complaint:: wellness exam and Annual Exam  ***       HPI:  Lauren Wolfe is a 45 y.o. female here for wellness exam                        Also***   Wt Readings from Last 3 Encounters:  12/22/20 190 lb (86.2 kg)  11/03/20 186 lb (84.4 kg)  08/02/20 183 lb (83 kg)   BP Readings from Last 3 Encounters:  12/22/20 138/82  11/03/20 133/86  08/02/20 128/88   Immunization History  Administered Date(s) Administered  . Influenza Whole 07/21/2010  . Influenza,inj,Quad PF,6+ Mos 08/21/2018  . Influenza-Unspecified 07/05/2017, 08/19/2018  . PFIZER(Purple Top)SARS-COV-2 Vaccination 12/10/2019, 01/07/2020, 09/24/2020  . Td 07/21/2010  . Tdap 01/30/2017   Health Maintenance Due  Topic Date Due  . Hepatitis C Screening  Never done      Past Medical History:  Diagnosis Date  . Anxiety   . Arm vein blood clot, right   . Knee pain, left 01/2019  . Migraine   . Wears glasses    and contacts   Past Surgical History:  Procedure Laterality Date  . BREAST BIOPSY  10/31/2012   Procedure: BREAST BIOPSY WITH NEEDLE LOCALIZATION;  Surgeon: Rolm Bookbinder, MD;  Location: [facility];  Service: General;  Laterality: Right;  right breast wire guided excisional biopsy  . BREAST EXCISIONAL BIOPSY Right 2014  . ganglion cysts  2011  . WRIST SURGERY Left     reports that she has never smoked. She has never used smokeless tobacco. She reports that she does not drink alcohol and does not use drugs. family history includes Aneurysm in her mother; Breast cancer in her cousin, maternal grandmother, and another family member; Diabetes in an other family member; Gout in an other family member; Hypertension in her mother and another family member; Migraines in her paternal aunt; Other in an other family member; Stomach cancer in her father. Allergies  Allergen Reactions  . Azithromycin  Shortness Of Breath, Rash and Other (See Comments)    Prurutis  . Codeine Shortness Of Breath, Itching and Rash  . Hydrocodone Shortness Of Breath, Itching and Rash  . Ketorolac Tromethamine Shortness Of Breath, Itching and Rash  . Prednisone Shortness Of Breath  . Topiramate Shortness Of Breath  . Tramadol Itching   Current Outpatient Medications on File Prior to Visit  Medication Sig Dispense Refill  . Diclofenac Potassium,Migraine, 50 MG PACK Take 50 mg by mouth as needed. 10 each 5  . levonorgestrel (MIRENA) 20 MCG/24HR IUD 1 each by Intrauterine route once.    . ondansetron (ZOFRAN ODT) 4 MG disintegrating tablet Take 1 tablet (4 mg total) by mouth every 8 (eight) hours as needed for nausea or vomiting. 20 tablet 3  . Rimegepant Sulfate (NURTEC) 75 MG TBDP Take 75 mg by mouth as needed (take 1 tablet for acute headache, max is 75 mg/24 hours). 8 tablet 11  . SUMAtriptan (IMITREX) 6 MG/0.5ML SOLN injection One injection at onset of migraine.  May repeat in 2 hrs, if needed.  Max dose: 2 inj/day. This is a 30 day prescription. (Patient taking differently: Inject 6 mg into the skin every 2 (two) hours as needed for migraine (One injection at onset.  May repeat in 2 hrs, if needed.  Max dose: 2 inj/day.).) 5 mL 11  . tiZANidine (ZANAFLEX) 4 MG tablet Take 1 tablet (4 mg total) by mouth every 8 (eight) hours as needed (For migraines.). 30 tablet 1  . triamcinolone cream (KENALOG) 0.1 % APPLY TO AFFECTED AREA TWICE A DAY 30 g 2  . rosuvastatin (CRESTOR) 20 MG tablet Take 1 tablet (20 mg total) by mouth daily. 90 tablet 3   No current facility-administered medications on file prior to visit.        ROS:  All others reviewed and negative.  Objective        PE:  BP 138/82   Pulse 72   Temp 98.6 F (37 C) (Oral)   Ht 5' 6"  (1.676 m)   Wt 190 lb (86.2 kg)   SpO2 97%   BMI 30.67 kg/m                 Constitutional: Pt appears in NAD               HENT: Head: NCAT.                Right  Ear: External ear normal.                 Left Ear: External ear normal.                Eyes: . Pupils are equal, round, and reactive to light. Conjunctivae and EOM are normal               Nose: without d/c or deformity               Neck: Neck supple. Gross normal ROM               Cardiovascular: Normal rate and regular rhythm.                 Pulmonary/Chest: Effort normal and breath sounds without rales or wheezing.                Abd:  Soft, NT, ND, + BS, no organomegaly               Neurological: Pt is alert. At baseline orientation, motor grossly intact               Skin: Skin is warm. No rashes, no other new lesions, LE edema - none               Psychiatric: Pt behavior is normal without agitation   Micro: none  Cardiac tracings I have personally interpreted today:  none  Pertinent Radiological findings (summarize): none   Lab Results  Component Value Date   WBC 6.2 12/17/2019   HGB 14.6 12/17/2019   HCT 43.6 12/17/2019   PLT 269.0 12/17/2019   GLUCOSE 86 07/30/2020   CHOL 168 07/30/2020   TRIG 49 07/30/2020   HDL 69 07/30/2020   LDLDIRECT 164.5 09/19/2012   LDLCALC 89 07/30/2020   ALT 11 07/30/2020   AST 18 07/30/2020   NA 137 07/30/2020   K 4.3 07/30/2020   CL 101 07/30/2020   CREATININE 1.02 (H) 07/30/2020   BUN 10 07/30/2020   CO2 25 07/30/2020   TSH 1.97 12/17/2019   INR 1.0 11/11/2019   Assessment/Plan:  [name] is a 45 y.o. Black or African American [2] female with  has a past medical history of Anxiety, Arm vein blood clot, right, Knee pain, left (01/2019), Migraine, and  Wears glasses.  No problem-specific Assessment & Plan notes found for this encounter.  Followup: No follow-ups on file.  Cathlean Cower, MD 12/22/2020 8:48 AM [facility]

## 2020-12-22 NOTE — Progress Notes (Signed)
Patient ID: Lauren Wolfe, female   DOB: 11-May-1976, 45 y.o.   MRN: [id number]         Chief Complaint:: wellness exam and low vit d, htn, hld       HPI:  Fred REEVE MALLO is a 45 y.o. female here for wellness exam; up to date with preventive referrals and immunizations                        Also not taking vit d.  Pt denies chest pain, increased sob or doe, wheezing, orthopnea, PND, increased LE swelling, palpitations, dizziness or syncope.   Pt denies polydipsia, polyuria, .  Pt states overall good compliance with meds, trying to follow lower cholesterol, diabetic diet, wt overall increased several lbs, but little exercise however.  Denies new focal neuro s/s.   Pt denies fever, wt loss, night sweats, loss of appetite, or other constitutional symptoms  No other new complaints   Wt Readings from Last 3 Encounters:  12/22/20 190 lb (86.2 kg)  11/03/20 186 lb (84.4 kg)  08/02/20 183 lb (83 kg)   BP Readings from Last 3 Encounters:  12/22/20 138/82  11/03/20 133/86  08/02/20 128/88   Immunization History  Administered Date(s) Administered  . Influenza Whole 07/21/2010  . Influenza,inj,Quad PF,6+ Mos 08/21/2018  . Influenza-Unspecified 07/05/2017, 08/19/2018  . PFIZER(Purple Top)SARS-COV-2 Vaccination 12/10/2019, 01/07/2020, 09/24/2020  . Td 07/21/2010  . Tdap 01/30/2017   There are no preventive care reminders to display for this patient.    Past Medical History:  Diagnosis Date  . Anxiety   . Arm vein blood clot, right   . Knee pain, left 01/2019  . Migraine   . Wears glasses    and contacts   Past Surgical History:  Procedure Laterality Date  . BREAST BIOPSY  10/31/2012   Procedure: BREAST BIOPSY WITH NEEDLE LOCALIZATION;  Surgeon: Rolm Bookbinder, MD;  Location: [facility];  Service: General;  Laterality: Right;  right breast wire guided excisional biopsy  . BREAST EXCISIONAL BIOPSY Right 2014  . ganglion cysts  2011  . WRIST SURGERY Left      reports that she has never smoked. She has never used smokeless tobacco. She reports that she does not drink alcohol and does not use drugs. family history includes Aneurysm in her mother; Breast cancer in her cousin, maternal grandmother, and another family member; Diabetes in an other family member; Gout in an other family member; Hypertension in her mother and another family member; Migraines in her paternal aunt; Other in an other family member; Stomach cancer in her father. Allergies  Allergen Reactions  . Azithromycin Shortness Of Breath, Rash and Other (See Comments)    Prurutis  . Codeine Shortness Of Breath, Itching and Rash  . Hydrocodone Shortness Of Breath, Itching and Rash  . Ketorolac Tromethamine Shortness Of Breath, Itching and Rash  . Prednisone Shortness Of Breath  . Topiramate Shortness Of Breath  . Tramadol Itching   Current Outpatient Medications on File Prior to Visit  Medication Sig Dispense Refill  . Diclofenac Potassium,Migraine, 50 MG PACK Take 50 mg by mouth as needed. 10 each 5  . levonorgestrel (MIRENA) 20 MCG/24HR IUD 1 each by Intrauterine route once.    . ondansetron (ZOFRAN ODT) 4 MG disintegrating tablet Take 1 tablet (4 mg total) by mouth every 8 (eight) hours as needed for nausea or vomiting. 20 tablet 3  . Rimegepant Sulfate (NURTEC) 75 MG TBDP  Take 75 mg by mouth as needed (take 1 tablet for acute headache, max is 75 mg/24 hours). 8 tablet 11  . SUMAtriptan (IMITREX) 6 MG/0.5ML SOLN injection One injection at onset of migraine.  May repeat in 2 hrs, if needed.  Max dose: 2 inj/day. This is a 30 day prescription. (Patient taking differently: Inject 6 mg into the skin every 2 (two) hours as needed for migraine (One injection at onset.  May repeat in 2 hrs, if needed.  Max dose: 2 inj/day.).) 5 mL 11  . tiZANidine (ZANAFLEX) 4 MG tablet Take 1 tablet (4 mg total) by mouth every 8 (eight) hours as needed (For migraines.). 30 tablet 1   No current  facility-administered medications on file prior to visit.        ROS:  All others reviewed and negative.  Objective        PE:  BP 138/82   Pulse 72   Temp 98.6 F (37 C) (Oral)   Ht 5' 6"  (1.676 m)   Wt 190 lb (86.2 kg)   SpO2 97%   BMI 30.67 kg/m                 Constitutional: Pt appears in NAD               HENT: Head: NCAT.                Right Ear: External ear normal.                 Left Ear: External ear normal.                Eyes: . Pupils are equal, round, and reactive to light. Conjunctivae and EOM are normal               Nose: without d/c or deformity               Neck: Neck supple. Gross normal ROM               Cardiovascular: Normal rate and regular rhythm.                 Pulmonary/Chest: Effort normal and breath sounds without rales or wheezing.                Abd:  Soft, NT, ND, + BS, no organomegaly               Neurological: Pt is alert. At baseline orientation, motor grossly intact               Skin: Skin is warm. No rashes, no other new lesions, LE edema - none               Psychiatric: Pt behavior is normal without agitation   Micro: none  Cardiac tracings I have personally interpreted today:  none  Pertinent Radiological findings (summarize): none   Lab Results  Component Value Date   WBC 7.2 12/22/2020   HGB 14.7 12/22/2020   HCT 43.7 12/22/2020   PLT 258.0 12/22/2020   GLUCOSE 84 12/22/2020   CHOL 177 12/22/2020   TRIG 55.0 12/22/2020   HDL 76.60 12/22/2020   LDLDIRECT 164.5 09/19/2012   LDLCALC 90 12/22/2020   ALT 12 12/22/2020   AST 15 12/22/2020   NA 139 12/22/2020   K 3.7 12/22/2020   CL 104 12/22/2020   CREATININE 0.86 12/22/2020   BUN 11 12/22/2020   CO2 29 12/22/2020  TSH 2.09 12/22/2020   INR 1.0 11/11/2019   Assessment/Plan:  Manon S Bonny is a 45 y.o. Black or African American [2] female with  has a past medical history of Anxiety, Arm vein blood clot, right, Knee pain, left (01/2019), Migraine, and Wears  glasses.  Encounter for well adult exam with abnormal findings Age and sex appropriate education and counseling updated with regular exercise and diet Referrals for preventative services - none needed Immunizations addressed - none needed Smoking counseling  - none needed Evidence for depression or other mood disorder - none significant Most recent labs reviewed. I have personally reviewed and have noted: 1) the patient's medical and social history 2) The patient's current medications and supplements 3) The patient's height, weight, and BMI have been recorded in the chart   Vitamin D deficiency Last vitamin D Lab Results  Component Value Date   VD25OH 27.23 (L) 12/22/2020   Low, to start oral replacement  HLD (hyperlipidemia) Lab Results  Component Value Date   LDLCALC 90 12/22/2020   Stable, pt to continue current statin crestor 20   Essential hypertension BP Readings from Last 3 Encounters:  12/22/20 138/82  11/03/20 133/86  08/02/20 128/88   Stable, pt to continue medical treatment  - diet and wt control   Followup: Return in about 1 year (around 12/22/2021).  Cathlean Cower, MD 12/27/2020 3:58 AM [facility]

## 2020-12-22 NOTE — Patient Instructions (Signed)
Please continue all other medications as before, and refill for the steroid cream is done  Please have the pharmacy call with any other refills you may need.  Please continue your efforts at being more active, low cholesterol diet, and weight control.  You are otherwise up to date with prevention measures today.  Please keep your appointments with your specialists as you may have planned  Please go to the LAB at the blood drawing area for the tests to be done  You will be contacted by phone if any changes need to be made immediately.  Otherwise, you will receive a letter about your results with an explanation, but please check with MyChart first.  Please remember to sign up for MyChart if you have not done so, as this will be important to you in the future with finding out test results, communicating by private email, and scheduling acute appointments online when needed.  Please make an Appointment to return for your 1 year visit, or sooner if needed

## 2020-12-23 LAB — HEPATITIS C ANTIBODY
Hepatitis C Ab: NONREACTIVE
SIGNAL TO CUT-OFF: 0 (ref <1.00)

## 2020-12-27 ENCOUNTER — Encounter: Payer: Self-pay | Admitting: Internal Medicine

## 2020-12-27 MED ORDER — ROSUVASTATIN CALCIUM 20 MG PO TABS: 20.0000 mg | ORAL_TABLET | Freq: Every day | ORAL | 3 refills | Status: DC

## 2020-12-27 NOTE — Assessment & Plan Note (Signed)

## 2020-12-27 NOTE — Assessment & Plan Note (Signed)
Lab Results  Component Value Date   LDLCALC 90 12/22/2020   Stable, pt to continue current statin crestor 20

## 2020-12-27 NOTE — Assessment & Plan Note (Signed)
Last vitamin D Lab Results  Component Value Date   VD25OH 27.23 (L) 12/22/2020   Low, to start oral replacement

## 2020-12-27 NOTE — Assessment & Plan Note (Signed)
BP Readings from Last 3 Encounters:  12/22/20 138/82  11/03/20 133/86  08/02/20 128/88   Stable, pt to continue medical treatment  - diet and wt control

## 2021-01-14 ENCOUNTER — Telehealth: Payer: Self-pay | Admitting: Cardiovascular Disease

## 2021-01-14 MED ORDER — ROSUVASTATIN CALCIUM 10 MG PO TABS: 10.0000 mg | ORAL_TABLET | ORAL | 3 refills | Status: DC

## 2021-01-14 NOTE — Telephone Encounter (Signed)
Pt c/o medication issue:  1. Name of Medication:  rosuvastatin (CRESTOR) 20 MG tablet 2. How are you currently taking this medication (dosage and times per day)? As prescribed  3. Are you having a reaction (difficulty breathing--STAT)? Yes  4. What is your medication issue? PT is calling with concerns about this medication. She states that she is having pain in her hips since taking the medication.She has been having this issue for several months and she is taking it regularly now.Please advise

## 2021-01-14 NOTE — Telephone Encounter (Signed)
Spoke with patient regarding Dr. Evette Georges recommendations. Advised patient to let us know how she is feeling in a weeks after being on a reduced dose.   Advised patient that this prescription has been sent in to preferred pharmacy. Advised patient to call back to office with any issues, questions, or concerns. Patient verbalized understanding.

## 2021-01-14 NOTE — Telephone Encounter (Signed)
Returned call to patient who states that she has been having muscle pain, leg pain, and hip pain since starting taking Rosuvastatin consistently. A couple of months ago.  Patient states that she recently found out that Statins could cause muscle aches and pain.   Patient would like to know if there are any recommendations in regard to this.   Advised patient that I would forward message to PharmD/MD for review and advice.   Patient verbalized understanding.

## 2021-01-14 NOTE — Telephone Encounter (Signed)
Try reducing rosuvastatin to 10 mg every other day;  may need addition of zetia in future

## 2021-01-21 ENCOUNTER — Encounter: Payer: Self-pay | Admitting: Internal Medicine

## 2021-01-21 NOTE — Telephone Encounter (Signed)
Please advise 

## 2021-01-25 ENCOUNTER — Telehealth: Payer: Self-pay | Admitting: *Deleted

## 2021-01-25 NOTE — Telephone Encounter (Signed)
PA for Nurtec 71m started on covermymeds (key: [id number]). Pt has pharmacy coverage through [id number] [phone] through 01/25/2022.

## 2021-01-31 ENCOUNTER — Telehealth: Payer: Self-pay | Admitting: Cardiovascular Disease

## 2021-01-31 DIAGNOSIS — E781 Pure hyperglyceridemia: Secondary | ICD-10-CM

## 2021-01-31 NOTE — Telephone Encounter (Signed)
This RN called patient back at [phone], patient had not gotten an answer regarding her 4/28 MyChart message, this RN relayed the following message from that encounter:  Rodriguez-Guzman, Raquel, RPH-CPP to Meryl Crutch, RN      5:00 PM Please STOP taking rosuvastatin and make appointment with lipid clinic to discuss options.   Raquel Rodriguez-Guzman PharmD, BCPS, [place]  [place] [address]  01/28/2021 5:00 PM   Order placed for lipid clinic referral per Raquel, PharmD. Patient verbalized understanding, no further questions at this time.

## 2021-01-31 NOTE — Addendum Note (Signed)
Addended by: Tori Milks A on: 01/31/2021 03:00 PM   Modules accepted: Orders

## 2021-01-31 NOTE — Telephone Encounter (Signed)
Patient calling to follow up on mychart message from 01/27/2021.

## 2021-02-03 NOTE — Telephone Encounter (Signed)
[employer] Mickel Baas) called, wanted to know if you need assistance with PA for Rimegepant Sulfate (NURTEC) 75 MG TBDP. Pt's phone number and zip code do not match the records. Would like a call from the nurse.  Contact info: [phone] Reference key: [facility]

## 2021-02-21 ENCOUNTER — Other Ambulatory Visit: Payer: Self-pay | Admitting: Obstetrics and Gynecology

## 2021-02-21 DIAGNOSIS — Z09 Encounter for follow-up examination after completed treatment for conditions other than malignant neoplasm: Secondary | ICD-10-CM

## 2021-02-23 ENCOUNTER — Telehealth: Payer: Self-pay

## 2021-02-23 ENCOUNTER — Other Ambulatory Visit: Payer: Self-pay

## 2021-02-23 ENCOUNTER — Ambulatory Visit (INDEPENDENT_AMBULATORY_CARE_PROVIDER_SITE_OTHER): Payer: Commercial Managed Care - PPO | Admitting: Pharmacist

## 2021-02-23 VITALS — BP 134/86 | HR 82 | Resp 16 | Ht 66.0 in | Wt 191.8 lb

## 2021-02-23 DIAGNOSIS — M791 Myalgia, unspecified site: Secondary | ICD-10-CM

## 2021-02-23 DIAGNOSIS — T466X5A Adverse effect of antihyperlipidemic and antiarteriosclerotic drugs, initial encounter: Secondary | ICD-10-CM | POA: Diagnosis not present

## 2021-02-23 DIAGNOSIS — E7800 Pure hypercholesterolemia, unspecified: Secondary | ICD-10-CM | POA: Diagnosis not present

## 2021-02-23 MED ORDER — PRALUENT 150 MG/ML ~~LOC~~ SOAJ: 150.0000 mg | SUBCUTANEOUS | 11 refills | Status: DC

## 2021-02-23 NOTE — Telephone Encounter (Signed)
Called and spoke w/pt and stated that they were approved for praluent, rx sent, pt instructed to get fasting labs post fourth dose and pt has copay card, pt voiced understanding

## 2021-02-23 NOTE — Patient Instructions (Addendum)
It was nice meeting you today!  We would like your LDL (bad cholesterol) to be less than 70  We will start you on a new injection that you will inject once every 2 weeks  We will recheck your cholesterol in 2-3 months  We will call you when the prior authorization is approved  Please call with any questions   Karren Cobble, PharmD, BCACP, [place], [place] [address], [place], [place] [address] Phone: [phone]; Fax: [phone] 02/23/2021 9:59 AM

## 2021-02-23 NOTE — Progress Notes (Signed)
Patient ID: Lauren Wolfe                 DOB: Jul 21, 1976                    MRN: [id number]     HPI: Lauren Wolfe is a 45 y.o. female patient referred to lipid clinic by Dr Claiborne Billings. PMH is significant for upper arm DVT, migraine, history of Covid pneumonia.  Patient presents today in good spirits.  Self admits she is often forgetful to take medications.  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 Tried rosuvastatin but myalgias were too severe to handle.    Is familiar with self administering injections due to her history of migraines.  Has taken Ejovy, Aimovig and injectable sumatriptan.  Gave patient options of pharmacologic therapy including oral tablets and injectables and patient prefers injectables since it fits her schedule easier.  Current Medications: n/a Intolerances: rosuvastatin 35m, 254m(myalgia) Risk Factors: HTN LDL goal: <70  Labs:  TC 177, Trigs 55, HDL 76.6, LDL 90 (12/22/20 on rosuvastatin 1063m Past Medical History:  Diagnosis Date  . Anxiety   . Arm vein blood clot, right   . Knee pain, left 01/2019  . Migraine   . Wears glasses    and contacts    Current Outpatient Medications on File Prior to Visit  Medication Sig Dispense Refill  . Diclofenac Potassium,Migraine, 50 MG PACK Take 50 mg by mouth as needed. 10 each 5  . levonorgestrel (MIRENA) 20 MCG/24HR IUD 1 each by Intrauterine route once.    . ondansetron (ZOFRAN ODT) 4 MG disintegrating tablet Take 1 tablet (4 mg total) by mouth every 8 (eight) hours as needed for nausea or vomiting. 20 tablet 3  . Rimegepant Sulfate (NURTEC) 75 MG TBDP Take 75 mg by mouth as needed (take 1 tablet for acute headache, max is 75 mg/24 hours). 8 tablet 11  . SUMAtriptan (IMITREX) 6 MG/0.5ML SOLN injection One injection at onset of migraine.  May repeat in 2 hrs, if needed.  Max dose: 2 inj/day. This is a 30 day prescription. (Patient taking differently: Inject 6 mg into the skin every 2 (two) hours as needed for migraine  (One injection at onset.  May repeat in 2 hrs, if needed.  Max dose: 2 inj/day.).) 5 mL 11  . tiZANidine (ZANAFLEX) 4 MG tablet Take 1 tablet (4 mg total) by mouth every 8 (eight) hours as needed (For migraines.). 30 tablet 1  . triamcinolone (KENALOG) 0.1 % APPLY TO AFFECTED AREA TWICE A DAY 30 g 2   No current facility-administered medications on file prior to visit.    Allergies  Allergen Reactions  . Azithromycin Shortness Of Breath, Rash and Other (See Comments)    Prurutis  . Codeine Shortness Of Breath, Itching and Rash  . Hydrocodone Shortness Of Breath, Itching and Rash  . Ketorolac Tromethamine Shortness Of Breath, Itching and Rash  . Prednisone Shortness Of Breath  . Topiramate Shortness Of Breath  . Rosuvastatin Other (See Comments)    Body aches  . Tramadol Itching    Assessment/Plan:  1. Hyperlipidemia - Patient LDL 90 which is above goal of <70.  Patient interested in starting PCSK9i therapy.  Using demo pen, educated patient on storage, site selection, and administration.  Patient voiced understanding.  Repeat lipid panel scheduled for early August.  Will complete PA, printed copay card.  ChrKarren CobbleharmD, BCACP, CDCES, CPPCaroline  [place]. [address], [place], [place] [address] Phone: [phone]; Fax: [phone] 02/23/2021 4:33 PM

## 2021-02-24 ENCOUNTER — Encounter: Payer: Self-pay | Admitting: Neurology

## 2021-03-01 NOTE — Telephone Encounter (Signed)
DOT Form for [employer] Waiver application form- completed to SS/NPto review and signature.

## 2021-03-02 ENCOUNTER — Other Ambulatory Visit: Payer: Self-pay | Admitting: Obstetrics and Gynecology

## 2021-03-02 DIAGNOSIS — N63 Unspecified lump in unspecified breast: Secondary | ICD-10-CM

## 2021-03-22 ENCOUNTER — Other Ambulatory Visit: Payer: Self-pay | Admitting: Internal Medicine

## 2021-04-08 ENCOUNTER — Ambulatory Visit
Admission: RE | Admit: 2021-04-08 | Discharge: 2021-04-08 | Disposition: A | Discharge status: Home / Self Care | Payer: Commercial Managed Care - PPO | Source: Ambulatory Visit | Attending: Obstetrics and Gynecology | Admitting: Obstetrics and Gynecology

## 2021-04-08 ENCOUNTER — Other Ambulatory Visit: Payer: Self-pay

## 2021-04-08 DIAGNOSIS — N63 Unspecified lump in unspecified breast: Secondary | ICD-10-CM

## 2021-04-15 DIAGNOSIS — Z1231 Encounter for screening mammogram for malignant neoplasm of breast: Secondary | ICD-10-CM

## 2021-04-22 LAB — LIPID PANEL
Chol/HDL Ratio: 2.4 ratio (ref 0.0–4.4)
Cholesterol, Total: 199 mg/dL (ref 100–199)
HDL: 84 mg/dL (ref >39)
LDL Chol Calc (NIH): 98 mg/dL (ref 0–99)
Triglycerides: 99 mg/dL (ref 0–149)
VLDL Cholesterol Cal: 17 mg/dL (ref 5–40)

## 2021-05-01 NOTE — Progress Notes (Signed)
Lipids improved from 1 year ago but slightly increased from 9 months ago.  Total cholesterol 199, LDL 98.  Statin intolerant.  To start PCSK9 inhibition

## 2021-07-05 ENCOUNTER — Telehealth: Payer: Self-pay | Admitting: Cardiovascular Disease

## 2021-07-05 NOTE — Telephone Encounter (Signed)
Patient states someone called her earlier regarding scheduling an appointment with the [facility], but then said they would call her back later to schedule. She states it has been a few hours and she hasn't heard back from anyone, so she is following up. Is anyone able to advise on this?  We also scheduled her for 12/21/21 with Dr. Claiborne Billings.

## 2021-07-08 ENCOUNTER — Other Ambulatory Visit: Payer: Self-pay | Admitting: Neurology

## 2021-07-11 NOTE — Telephone Encounter (Signed)
Returned a call to pt who stated that she wanted another appt because they didn't see desired lowering of ldl since she started praluent. I will route to pavero rph and see if we can document that ldl didn't decrease. I am also not sure her med compliance is the greatest.

## 2021-07-12 ENCOUNTER — Ambulatory Visit (INDEPENDENT_AMBULATORY_CARE_PROVIDER_SITE_OTHER): Payer: Commercial Managed Care - PPO | Admitting: Pharmacist Clinician (PhC)/ Clinical Pharmacy Specialist

## 2021-07-12 ENCOUNTER — Other Ambulatory Visit: Payer: Self-pay

## 2021-07-12 VITALS — BP 128/82 | HR 78 | Resp 16 | Ht 66.0 in | Wt 191.0 lb

## 2021-07-12 DIAGNOSIS — M791 Myalgia, unspecified site: Secondary | ICD-10-CM

## 2021-07-12 DIAGNOSIS — T466X5A Adverse effect of antihyperlipidemic and antiarteriosclerotic drugs, initial encounter: Secondary | ICD-10-CM

## 2021-07-12 DIAGNOSIS — E7800 Pure hypercholesterolemia, unspecified: Secondary | ICD-10-CM

## 2021-07-12 DIAGNOSIS — T466X5D Adverse effect of antihyperlipidemic and antiarteriosclerotic drugs, subsequent encounter: Secondary | ICD-10-CM | POA: Diagnosis not present

## 2021-07-12 NOTE — Assessment & Plan Note (Signed)
Patient with hyperlipidemia, unable to tolerate multiple statin drugs.  She was started on Praluent 150 mg q14d, but after 4 doses, LDL remained unchanged.  Patient denied compliance issues.  Will have her switch to Repatha 140 mg and repeat labs after 5-6 doses.

## 2021-07-12 NOTE — Telephone Encounter (Signed)
Yes please call to schedule

## 2021-07-12 NOTE — Patient Instructions (Addendum)
Your Results:             Your most recent labs Goal  Total Cholesterol 199 < 200  Triglycerides 99 < 150  HDL (happy/good cholesterol) 84 > 40  LDL (lousy/bad cholesterol 98 < 70      Medication changes:  Stop Praluent, start Repatha 140 mg every 14 days  Lab orders:  Repeat labs in 3 months, after the first of the new year.  We will mail lab order to you around Christmas.     Thank you for choosing [facility]

## 2021-07-12 NOTE — Progress Notes (Signed)
07/12/2021 Lauren Wolfe 04-23-1976 [id number]   HPI:  Lauren Wolfe is a 45 y.o. female patient of Dr Claiborne Billings, who presents today for a lipid clinic evaluation.  See pertinent past medical history below.  She was seen in the lipid clinic back in May, noting that she could not tolerate rosuvastatin due to severe myalgias.  She was started on alirocumab 150 mg at that time, with an LDL of 90.   Labs drawn 2 months after starting medication show LDL essentially unchanged at 98.  Patient denies missed doses, reviewed injection technique and appears to be using correctly.    Past Medical History: DVT   migraine Uses Nurtec mostly - sumatriptan injection  hypertension Never on medication    Current Medications: alirocumab 150 mg  Cholesterol Goals: LDL < 100   Intolerant/previously tried:   rosuvastatin, atorvastatin - myalgias  Family history: mother with hypertension, died in auto accident; no siblings; daughter 48 w/o hx of heart disease   Diet: mix of home cooked and eating out.  Currently working on degree in [employer], so able to eat some at school.    Exercise:  15,000 steps per day with school and 2 jobs.   Labs: 7/21:  TC 199, TG 99, HDL 84, LDL 98   Current Outpatient Medications  Medication Sig Dispense Refill   Alirocumab (PRALUENT) 150 MG/ML SOAJ Inject 150 mg into the skin every 14 (fourteen) days. 2 mL 11   Diclofenac Potassium,Migraine, 50 MG PACK Take 50 mg by mouth as needed. 10 each 5   levonorgestrel (MIRENA) 20 MCG/24HR IUD 1 each by Intrauterine route once.     ondansetron (ZOFRAN-ODT) 4 MG disintegrating tablet TAKE 1 TABLET BY MOUTH EVERY 8 HOURS AS NEEDED FOR NAUSEA AND VOMITING 9 tablet 8   Rimegepant Sulfate (NURTEC) 75 MG TBDP Take 75 mg by mouth as needed (take 1 tablet for acute headache, max is 75 mg/24 hours). 8 tablet 11   SUMAtriptan (IMITREX) 6 MG/0.5ML SOLN injection One injection at onset of migraine.  May repeat in 2 hrs, if  needed.  Max dose: 2 inj/day. This is a 30 day prescription. (Patient taking differently: Inject 6 mg into the skin every 2 (two) hours as needed for migraine (One injection at onset.  May repeat in 2 hrs, if needed.  Max dose: 2 inj/day.).) 5 mL 11   tiZANidine (ZANAFLEX) 4 MG tablet Take 1 tablet (4 mg total) by mouth every 8 (eight) hours as needed (For migraines.). 30 tablet 1   triamcinolone cream (KENALOG) 0.1 % APPLY TO AFFECTED AREA TWICE A DAY 30 g 2   phentermine (ADIPEX-P) 37.5 MG tablet Take 37.5 mg by mouth daily.     No current facility-administered medications for this visit.    Allergies  Allergen Reactions   Azithromycin Shortness Of Breath, Rash and Other (See Comments)    Prurutis   Codeine Shortness Of Breath, Itching and Rash   Hydrocodone Shortness Of Breath, Itching and Rash   Ketorolac Tromethamine Shortness Of Breath, Itching and Rash   Prednisone Shortness Of Breath   Topiramate Shortness Of Breath   Lipitor [Atorvastatin]     MYALGIAS   Rosuvastatin Other (See Comments)    Body aches   Tramadol Itching    Past Medical History:  Diagnosis Date   Anxiety    Arm vein blood clot, right    Knee pain, left 01/2019   Migraine    Wears glasses    and  contacts    Blood pressure 128/82, pulse 78, resp. rate 16, height 5' 6"  (1.676 m), weight 191 lb (86.6 kg), SpO2 98 %.   HLD (hyperlipidemia) Patient with hyperlipidemia, unable to tolerate multiple statin drugs.  She was started on Praluent 150 mg q14d, but after 4 doses, LDL remained unchanged.  Patient denied compliance issues.  Will have her switch to Repatha 140 mg and repeat labs after 5-6 doses.     Tommy Medal PharmD CPP [facility] [address] [place], [place] [address] [phone]

## 2021-07-12 NOTE — Telephone Encounter (Signed)
CALLED AND WAS ABLE TO SCHEDULE A 930 APPT FOR TODAY

## 2021-07-21 ENCOUNTER — Other Ambulatory Visit: Payer: Self-pay | Admitting: Neurology

## 2021-07-21 ENCOUNTER — Telehealth: Payer: Self-pay | Admitting: Neurology

## 2021-07-21 NOTE — Telephone Encounter (Signed)
Pt is wanting a call to discuss why she is being refused a refill on her Nurtec, pt states especially at times of the year when the weather changes she has more headaches.  Please call.

## 2021-07-21 NOTE — Telephone Encounter (Addendum)
Per chart, Nurtec 67m was sent in 11/03/2020 with 11 additional refills (she gets #8 per month). I tried to reach the patient by phone. There was no answer and the voicemail was not set up.

## 2021-07-22 ENCOUNTER — Encounter: Payer: Self-pay | Admitting: Neurology

## 2021-07-25 MED ORDER — NURTEC 75 MG PO TBDP: 75.0000 mg | ORAL_TABLET | ORAL | 11 refills | Status: DC | PRN

## 2021-07-25 NOTE — Telephone Encounter (Signed)
I spoke with Dr. Krista Blue. Received a VO for 12 tablets of nurtec 31m per 30 days.

## 2021-07-25 NOTE — Telephone Encounter (Signed)
I called patient. She is using more than 8 tablets/30 days of nurtec. Sarah, NP discussed with her that nurtec can be used as a preventative but patient would prefer to use nurtec as an abortive therapy for migraines. She is wondering if she can get additional refills or more tablets per month of nurtec. I will speak with Dr. Krista Blue and let the patient know via mychart.

## 2021-07-25 NOTE — Addendum Note (Signed)
Addended by: Lester Quitaque A on: 07/25/2021 01:41 PM   Modules accepted: Orders

## 2021-08-16 ENCOUNTER — Other Ambulatory Visit: Payer: Self-pay | Admitting: *Deleted

## 2021-08-16 MED ORDER — NURTEC 75 MG PO TBDP: 75.0000 mg | ORAL_TABLET | ORAL | 11 refills | Status: DC | PRN

## 2021-08-18 ENCOUNTER — Other Ambulatory Visit: Payer: Self-pay | Admitting: *Deleted

## 2021-08-18 DIAGNOSIS — G43811 Other migraine, intractable, with status migrainosus: Secondary | ICD-10-CM

## 2021-08-18 MED ORDER — BUTALBITAL-APAP-CAFFEINE 50-325-40 MG PO TABS: ORAL_TABLET | ORAL | 5 refills | Status: DC

## 2021-08-18 MED ORDER — NURTEC 75 MG PO TBDP: 75.0000 mg | ORAL_TABLET | ORAL | 11 refills | Status: DC | PRN

## 2021-08-18 NOTE — Telephone Encounter (Signed)
Note in Epic

## 2021-08-18 NOTE — Telephone Encounter (Signed)
Mychart message from patient:  I picked up my Nurtex prescription yesterday and it was only 8 again. Per the pharmacist insurance will only cover 8 a month because it's a name brand medication. So I might have come in or tru to figure out another regime. Even though they do really work, 8 isn't enough.    _____________________________________ The patient has already tried:   Topamax, Zonegran, Nortriptyline, Botox, Aimovig, Ajovy, Emagality, Gabapentin, Lyrica, Verapamil, Effexor, Cardizem, Imitrex, Maxalt, Frova, Relpax, Amerge, Indomethacin, Flexeril, Tizanidine, Oxycodone, Tramadol ____________________________________ She does not wish to try another preventive medication. Sates the medications above either did not work, made her headaches worse or caused adverse side effects.  ___________________________________ Per vo by Dr. Krista Blue, offer referral to headache clinic. Also, rx for either sumatriptan or Fioricet. She does not like sumatriptan (makes her sleepy). Dr. Krista Blue will authorize rx for Fioricet. The patient would like to go to the headache clinic at [facility]. Referral will be placed today.

## 2021-08-18 NOTE — Addendum Note (Signed)
Addended by: Noberto Retort C on: 08/18/2021 11:56 AM   Modules accepted: Orders

## 2021-08-22 ENCOUNTER — Telehealth: Payer: Self-pay | Admitting: Neurology

## 2021-08-22 ENCOUNTER — Encounter: Payer: Self-pay | Admitting: *Deleted

## 2021-08-22 ENCOUNTER — Other Ambulatory Visit: Payer: Self-pay | Admitting: *Deleted

## 2021-08-22 MED ORDER — SUMATRIPTAN SUCCINATE 6 MG/0.5ML ~~LOC~~ SOAJ: SUBCUTANEOUS | 5 refills | Status: DC

## 2021-08-22 NOTE — Telephone Encounter (Signed)
Referral faxed to [facility], phone # [phone].

## 2021-10-12 ENCOUNTER — Other Ambulatory Visit: Payer: Self-pay | Admitting: Pharmacist Clinician (PhC)/ Clinical Pharmacy Specialist

## 2021-10-12 DIAGNOSIS — E7800 Pure hypercholesterolemia, unspecified: Secondary | ICD-10-CM

## 2021-10-25 ENCOUNTER — Encounter: Payer: Self-pay | Admitting: Pharmacist Clinician (PhC)/ Clinical Pharmacy Specialist

## 2021-11-02 LAB — LIPID PANEL
Chol/HDL Ratio: 2.3 ratio (ref 0.0–4.4)
Cholesterol, Total: 193 mg/dL (ref 100–199)
HDL: 84 mg/dL (ref >39)
LDL Chol Calc (NIH): 96 mg/dL (ref 0–99)
Triglycerides: 72 mg/dL (ref 0–149)
VLDL Cholesterol Cal: 13 mg/dL (ref 5–40)

## 2021-11-02 LAB — HEPATIC FUNCTION PANEL
ALT: 12 IU/L (ref 0–32)
AST: 16 IU/L (ref 0–40)
Albumin: 4.4 g/dL (ref 3.8–4.8)
Alkaline Phosphatase: 77 IU/L (ref 44–121)
Bilirubin Total: 0.2 mg/dL (ref 0.0–1.2)
Bilirubin, Direct: <0.1 mg/dL (ref 0.00–0.40)
Total Protein: 6.9 g/dL (ref 6.0–8.5)
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
## 2021-11-03 ENCOUNTER — Encounter: Payer: Self-pay | Admitting: Neurology

## 2021-11-03 ENCOUNTER — Ambulatory Visit (INDEPENDENT_AMBULATORY_CARE_PROVIDER_SITE_OTHER): Payer: Commercial Managed Care - PPO | Admitting: Neurology

## 2021-11-03 VITALS — BP 130/86 | HR 99 | Ht 66.0 in | Wt 198.5 lb

## 2021-11-03 DIAGNOSIS — G43709 Chronic migraine without aura, not intractable, without status migrainosus: Secondary | ICD-10-CM | POA: Diagnosis not present

## 2021-11-03 MED ORDER — TIZANIDINE HCL 4 MG PO TABS: 4.0000 mg | ORAL_TABLET | Freq: Three times a day (TID) | ORAL | 1 refills | Status: DC | PRN

## 2021-11-03 MED ORDER — NURTEC 75 MG PO TBDP: 75.0000 mg | ORAL_TABLET | ORAL | 11 refills | Status: DC | PRN

## 2021-11-07 ENCOUNTER — Encounter: Payer: Self-pay | Admitting: Cardiovascular Disease

## 2021-11-10 MED ORDER — EZETIMIBE 10 MG PO TABS: 10.0000 mg | ORAL_TABLET | Freq: Every day | ORAL | 3 refills | Status: DC

## 2021-11-10 NOTE — Telephone Encounter (Signed)
Repatha denied most likely as patient has no personal history of ASCVD.  Explained this to patient and offered ezetimibe as other option.  Patient agreeable.  Will send in prescription and repeat labs in 3 months.

## 2021-12-21 ENCOUNTER — Ambulatory Visit: Payer: Commercial Managed Care - PPO | Admitting: Cardiovascular Disease

## 2021-12-21 ENCOUNTER — Other Ambulatory Visit: Payer: Self-pay

## 2021-12-30 ENCOUNTER — Encounter: Payer: Commercial Managed Care - PPO | Admitting: Internal Medicine

## 2022-01-20 ENCOUNTER — Encounter: Payer: Commercial Managed Care - PPO | Admitting: Internal Medicine

## 2022-02-23 ENCOUNTER — Ambulatory Visit (INDEPENDENT_AMBULATORY_CARE_PROVIDER_SITE_OTHER): Payer: Commercial Managed Care - PPO | Admitting: Internal Medicine

## 2022-02-23 VITALS — BP 122/84 | HR 83 | Temp 99.0°F | Ht 66.0 in | Wt 199.0 lb

## 2022-02-23 DIAGNOSIS — Z0001 Encounter for general adult medical examination with abnormal findings: Secondary | ICD-10-CM | POA: Diagnosis not present

## 2022-02-23 DIAGNOSIS — I10 Essential (primary) hypertension: Secondary | ICD-10-CM | POA: Diagnosis not present

## 2022-02-23 DIAGNOSIS — R739 Hyperglycemia, unspecified: Secondary | ICD-10-CM | POA: Diagnosis not present

## 2022-02-23 DIAGNOSIS — E669 Obesity, unspecified: Secondary | ICD-10-CM

## 2022-02-23 DIAGNOSIS — E538 Deficiency of other specified B group vitamins: Secondary | ICD-10-CM | POA: Diagnosis not present

## 2022-02-23 DIAGNOSIS — E7800 Pure hypercholesterolemia, unspecified: Secondary | ICD-10-CM

## 2022-02-23 DIAGNOSIS — E559 Vitamin D deficiency, unspecified: Secondary | ICD-10-CM

## 2022-02-23 MED ORDER — WEGOVY 0.5 MG/0.5ML ~~LOC~~ SOAJ: 0.5000 mg | SUBCUTANEOUS | 11 refills | Status: DC

## 2022-02-23 NOTE — Patient Instructions (Signed)
Please take OTC Vitamin D3 at 2000 units per day, indefinitely  Please take all new medication as prescribed - the wegovy if ok with the insurance  Please continue all other medications as before, and refills have been done if requested.  Please have the pharmacy call with any other refills you may need.  Please continue your efforts at being more active, low cholesterol diet, and weight control.  You are otherwise up to date with prevention measures today.  Please keep your appointments with your specialists as you may have planned  Please go to the LAB at the blood drawing area for the tests to be done - tomorrow as you mentioned  You will be contacted by phone if any changes need to be made immediately.  Otherwise, you will receive a letter about your results with an explanation, but please check with MyChart first.  Please remember to sign up for MyChart if you have not done so, as this will be important to you in the future with finding out test results, communicating by private email, and scheduling acute appointments online when needed.  Please make an Appointment to return for your 1 year visit, or sooner if needed, with Lab testing by Appointment as well, to be done about 3-5 days before at the [facility] (so this is for TWO appointments - please see the scheduling desk as you leave)   Due to the ongoing Covid 19 pandemic, our lab now requires an appointment for any labs done at our office.  If you need labs done and do not have an appointment, please call our office ahead of time to schedule before presenting to the lab for your testing.

## 2022-02-23 NOTE — Progress Notes (Signed)
Patient ID: Lauren Wolfe, female   DOB: [birth year], 46 y.o.   MRN: [id number]         Chief Complaint:: wellness exam and hld, low vit d, obesity, htn       HPI:  Lauren Wolfe is a 46 y.o. female here for wellness exam; declines covid booster, o/w up to date                        Also not taking Vit D.  Willing to consider wegovy for wt loss as diet and excericse not working well.  Pt denies chest pain, increased sob or doe, wheezing, orthopnea, PND, increased LE swelling, palpitations, dizziness or syncope.   Pt denies polydipsia, polyuria, or new focal neuro s/s.    Pt denies fever, wt loss, night sweats, loss of appetite, or other constitutional symptoms  Trying to follow lower chol diet.     Wt Readings from Last 3 Encounters:  02/23/22 199 lb (90.3 kg)  11/03/21 198 lb 8 oz (90 kg)  07/12/21 191 lb (86.6 kg)   BP Readings from Last 3 Encounters:  02/23/22 122/84  11/03/21 130/86  07/12/21 128/82   Immunization History  Administered Date(s) Administered   Influenza Whole 07/21/2010   Influenza,inj,Quad PF,6+ Mos 08/21/2018   Influenza-Unspecified 07/05/2017, 08/19/2018   PFIZER(Purple Top)SARS-COV-2 Vaccination 12/10/2019, 01/07/2020, 09/24/2020   Td 07/21/2010   Tdap 01/30/2017   Unspecified SARS-COV-2 Vaccination 06/30/2020   Health Maintenance Due  Topic Date Due   PAP SMEAR-Modifier  03/02/2022      Past Medical History:  Diagnosis Date   Anxiety    Arm vein blood clot, right    Knee pain, left 01/2019   Migraine    Wears glasses    and contacts   Past Surgical History:  Procedure Laterality Date   BREAST BIOPSY  10/31/2012   Procedure: BREAST BIOPSY WITH NEEDLE LOCALIZATION;  Surgeon: Rolm Bookbinder, MD;  Location: [place];  Service: General;  Laterality: Right;  right breast wire guided excisional biopsy   BREAST EXCISIONAL BIOPSY Right 2014   ganglion cysts  2011   WRIST SURGERY Left     reports that she has never smoked.  She has never used smokeless tobacco. She reports that she does not drink alcohol and does not use drugs. family history includes Aneurysm in her mother; Breast cancer in her cousin, maternal grandmother, and another family member; Diabetes in an other family member; Gout in an other family member; Hypertension in her mother and another family member; Migraines in her paternal aunt; Other in an other family member; Stomach cancer in her father. Allergies  Allergen Reactions   Azithromycin Shortness Of Breath, Rash and Other (See Comments)    Prurutis   Codeine Shortness Of Breath, Itching and Rash   Hydrocodone Shortness Of Breath, Itching and Rash   Ketorolac Tromethamine Shortness Of Breath, Itching and Rash   Prednisone Shortness Of Breath   Topiramate Shortness Of Breath   Lipitor [Atorvastatin]     MYALGIAS   Rosuvastatin Other (See Comments)    Body aches   Tramadol Itching   Current Outpatient Medications on File Prior to Visit  Medication Sig Dispense Refill   Alirocumab (PRALUENT) 150 MG/ML SOAJ Inject 150 mg into the skin every 14 (fourteen) days. 2 mL 11   butalbital-acetaminophen-caffeine (FIORICET) 50-325-40 MG tablet Take one every 8 hours, if needed for migraine. No more than 2 per day or 12  per month. No early refills. 12 tablet 5   levonorgestrel (MIRENA) 20 MCG/24HR IUD 1 each by Intrauterine route once.     ondansetron (ZOFRAN-ODT) 4 MG disintegrating tablet TAKE 1 TABLET BY MOUTH EVERY 8 HOURS AS NEEDED FOR NAUSEA AND VOMITING 9 tablet 8   Rimegepant Sulfate (NURTEC) 75 MG TBDP Take 75 mg by mouth as needed (take 1 tablet for acute headache, max is 75 mg/24 hours). 16 tablet 11   SUMAtriptan 6 MG/0.5ML SOAJ Take 1 inj at onset of migraine.  May repeat in 2 hrs, if needed.  Max dose: 2 inj/day. This is a 30 day prescription. 6 mL 5   tiZANidine (ZANAFLEX) 4 MG tablet Take 1 tablet (4 mg total) by mouth every 8 (eight) hours as needed (For migraines.). 30 tablet 1    triamcinolone cream (KENALOG) 0.1 % APPLY TO AFFECTED AREA TWICE A DAY 30 g 2   ezetimibe (ZETIA) 10 MG tablet Take 1 tablet (10 mg total) by mouth daily. 90 tablet 3   No current facility-administered medications on file prior to visit.        ROS:  All others reviewed and negative.  Objective        PE:  BP 122/84 (BP Location: Right Arm, Patient Position: Sitting, Cuff Size: Large)   Pulse 83   Temp 99 F (37.2 C) (Oral)   Ht 5' 6"  (1.676 m)   Wt 199 lb (90.3 kg)   SpO2 96%   BMI 32.12 kg/m                 Constitutional: Pt appears in NAD               HENT: Head: NCAT.                Right Ear: External ear normal.                 Left Ear: External ear normal.                Eyes: . Pupils are equal, round, and reactive to light. Conjunctivae and EOM are normal               Nose: without d/c or deformity               Neck: Neck supple. Gross normal ROM               Cardiovascular: Normal rate and regular rhythm.                 Pulmonary/Chest: Effort normal and breath sounds without rales or wheezing.                Abd:  Soft, NT, ND, + BS, no organomegaly               Neurological: Pt is alert. At baseline orientation, motor grossly intact               Skin: Skin is warm. No rashes, no other new lesions, LE edema - none               Psychiatric: Pt behavior is normal without agitation   Micro: none  Cardiac tracings I have personally interpreted today:  none  Pertinent Radiological findings (summarize): none   Lab Results  Component Value Date   WBC 6.4 02/24/2022   HGB 14.5 02/24/2022   HCT 43.8 02/24/2022   PLT 249.0 02/24/2022   GLUCOSE  82 02/24/2022   CHOL 190 02/24/2022   TRIG 75.0 02/24/2022   HDL 82.50 02/24/2022   LDLDIRECT 164.5 09/19/2012   LDLCALC 92 02/24/2022   ALT 12 02/24/2022   AST 15 02/24/2022   NA 137 02/24/2022   K 3.9 02/24/2022   CL 100 02/24/2022   CREATININE 0.91 02/24/2022   BUN 14 02/24/2022   CO2 29 02/24/2022   TSH  1.62 02/24/2022   INR 1.0 11/11/2019   HGBA1C 5.3 02/24/2022   Assessment/Plan:  Lauren Wolfe is a 46 y.o. Black or African American [2] female with  has a past medical history of Anxiety, Arm vein blood clot, right, Knee pain, left (01/2019), Migraine, and Wears glasses.  Vitamin D deficiency Last vitamin D Lab Results  Component Value Date   VD25OH 27.23 (L) 12/22/2020   Low to start oral replacement   Encounter for well adult exam with abnormal findings Age and sex appropriate education and counseling updated with regular exercise and diet Referrals for preventative services - none needed Immunizations addressed - declines covid booster Smoking counseling  - none needed Evidence for depression or other mood disorder - none significant Most recent labs reviewed. I have personally reviewed and have noted: 1) the patient's medical and social history 2) The patient's current medications and supplements 3) The patient's height, weight, and BMI have been recorded in the chart   Obesity (BMI 30-39.9) Chronic persistent, for wegovy asd if ok with insurance  HLD (hyperlipidemia) Lab Results  Component Value Date   LDLCALC 92 02/24/2022   Stable, pt to continue current statin zetia   Essential hypertension BP Readings from Last 3 Encounters:  02/23/22 122/84  11/03/21 130/86  07/12/21 128/82   Stable, pt to continue medical treatment * - diet, wt control, low salt excercise  Followup: Return in about 1 year (around 02/24/2023).  Cathlean Cower, MD 02/26/2022 10:02 AM [facility]

## 2022-02-23 NOTE — Assessment & Plan Note (Signed)
Last vitamin D Lab Results  Component Value Date   VD25OH 27.23 (L) 12/22/2020   Low to start oral replacement

## 2022-02-24 ENCOUNTER — Other Ambulatory Visit (INDEPENDENT_AMBULATORY_CARE_PROVIDER_SITE_OTHER): Payer: Commercial Managed Care - PPO

## 2022-02-24 DIAGNOSIS — E559 Vitamin D deficiency, unspecified: Secondary | ICD-10-CM | POA: Diagnosis not present

## 2022-02-24 DIAGNOSIS — E538 Deficiency of other specified B group vitamins: Secondary | ICD-10-CM | POA: Diagnosis not present

## 2022-02-24 DIAGNOSIS — E7800 Pure hypercholesterolemia, unspecified: Secondary | ICD-10-CM | POA: Diagnosis not present

## 2022-02-24 DIAGNOSIS — R739 Hyperglycemia, unspecified: Secondary | ICD-10-CM

## 2022-02-24 LAB — BASIC METABOLIC PANEL
BUN: 14 mg/dL (ref 6–23)
CO2: 29 mEq/L (ref 19–32)
Calcium: 9.6 mg/dL (ref 8.4–10.5)
Chloride: 100 mEq/L (ref 96–112)
Creatinine, Ser: 0.91 mg/dL (ref 0.40–1.20)
GFR: 76.08 mL/min (ref >60.00)
Glucose, Bld: 82 mg/dL (ref 70–99)
Potassium: 3.9 mEq/L (ref 3.5–5.1)
Sodium: 137 mEq/L (ref 135–145)

## 2022-02-24 LAB — CBC WITH DIFFERENTIAL/PLATELET
Basophils Absolute: 0 10*3/uL (ref 0.0–0.1)
Basophils Relative: 0.5 % (ref 0.0–3.0)
Eosinophils Absolute: 0 10*3/uL (ref 0.0–0.7)
Eosinophils Relative: 0.6 % (ref 0.0–5.0)
HCT: 43.8 % (ref 36.0–46.0)
Hemoglobin: 14.5 g/dL (ref 12.0–15.0)
Lymphocytes Relative: 31.9 % (ref 12.0–46.0)
Lymphs Abs: 2 10*3/uL (ref 0.7–4.0)
MCHC: 33.1 g/dL (ref 30.0–36.0)
MCV: 90.7 fl (ref 78.0–100.0)
Monocytes Absolute: 0.6 10*3/uL (ref 0.1–1.0)
Monocytes Relative: 9.7 % (ref 3.0–12.0)
Neutro Abs: 3.7 10*3/uL (ref 1.4–7.7)
Neutrophils Relative %: 57.3 % (ref 43.0–77.0)
Platelets: 249 10*3/uL (ref 150.0–400.0)
RBC: 4.83 Mil/uL (ref 3.87–5.11)
RDW: 13.3 % (ref 11.5–15.5)
WBC: 6.4 10*3/uL (ref 4.0–10.5)

## 2022-02-24 LAB — URINALYSIS, ROUTINE W REFLEX MICROSCOPIC
Bilirubin Urine: NEGATIVE
Hgb urine dipstick: NEGATIVE
Ketones, ur: NEGATIVE
Leukocytes,Ua: NEGATIVE
Nitrite: NEGATIVE
Specific Gravity, Urine: 1.015 (ref 1.000–1.030)
Total Protein, Urine: NEGATIVE
Urine Glucose: NEGATIVE
Urobilinogen, UA: 0.2 (ref 0.0–1.0)
pH: 6.5 (ref 5.0–8.0)

## 2022-02-24 LAB — TSH: TSH: 1.62 u[IU]/mL (ref 0.35–5.50)

## 2022-02-24 LAB — VITAMIN B12: Vitamin B-12: >1504 pg/mL — ABNORMAL HIGH (ref 211–911)

## 2022-02-24 LAB — HEMOGLOBIN A1C: Hgb A1c MFr Bld: 5.3 % (ref 4.6–6.5)

## 2022-02-24 LAB — HEPATIC FUNCTION PANEL
ALT: 12 U/L (ref 0–35)
AST: 15 U/L (ref 0–37)
Albumin: 4.5 g/dL (ref 3.5–5.2)
Alkaline Phosphatase: 59 U/L (ref 39–117)
Bilirubin, Direct: 0.1 mg/dL (ref 0.0–0.3)
Total Bilirubin: 0.6 mg/dL (ref 0.2–1.2)
Total Protein: 7.3 g/dL (ref 6.0–8.3)

## 2022-02-24 LAB — LIPID PANEL
Cholesterol: 190 mg/dL (ref 0–200)
HDL: 82.5 mg/dL (ref >39.00)
LDL Cholesterol: 92 mg/dL (ref 0–99)
NonHDL: 107.19
Total CHOL/HDL Ratio: 2
Triglycerides: 75 mg/dL (ref 0.0–149.0)
VLDL: 15 mg/dL (ref 0.0–40.0)

## 2022-02-24 LAB — VITAMIN D 25 HYDROXY (VIT D DEFICIENCY, FRACTURES): VITD: 18.32 ng/mL — ABNORMAL LOW (ref 30.00–100.00)

## 2022-02-26 ENCOUNTER — Encounter: Payer: Self-pay | Admitting: Internal Medicine

## 2022-02-26 DIAGNOSIS — Z6831 Body mass index (BMI) 31.0-31.9, adult: Secondary | ICD-10-CM | POA: Insufficient documentation

## 2022-02-26 DIAGNOSIS — E669 Obesity, unspecified: Secondary | ICD-10-CM | POA: Insufficient documentation

## 2022-02-26 NOTE — Assessment & Plan Note (Signed)
Lab Results  Component Value Date   LDLCALC 92 02/24/2022   Stable, pt to continue current statin zetia

## 2022-02-26 NOTE — Assessment & Plan Note (Signed)

## 2022-02-26 NOTE — Assessment & Plan Note (Signed)
Chronic persistent, for wegovy asd if ok with insurance

## 2022-02-26 NOTE — Assessment & Plan Note (Signed)
BP Readings from Last 3 Encounters:  02/23/22 122/84  11/03/21 130/86  07/12/21 128/82   Stable, pt to continue medical treatment * - diet, wt control, low salt excercise

## 2022-03-03 ENCOUNTER — Encounter: Payer: Self-pay | Admitting: Neurology

## 2022-03-03 ENCOUNTER — Encounter: Payer: Self-pay | Admitting: Internal Medicine

## 2022-03-06 ENCOUNTER — Encounter: Payer: Self-pay | Admitting: *Deleted

## 2022-03-07 ENCOUNTER — Other Ambulatory Visit: Payer: Self-pay | Admitting: Obstetrics and Gynecology

## 2022-03-08 ENCOUNTER — Other Ambulatory Visit: Payer: Self-pay | Admitting: Obstetrics and Gynecology

## 2022-03-08 DIAGNOSIS — Z1231 Encounter for screening mammogram for malignant neoplasm of breast: Secondary | ICD-10-CM

## 2022-03-14 ENCOUNTER — Encounter: Payer: Self-pay | Admitting: Internal Medicine

## 2022-03-15 ENCOUNTER — Other Ambulatory Visit: Payer: Self-pay | Admitting: Cardiovascular Disease

## 2022-03-29 ENCOUNTER — Encounter: Payer: Self-pay | Admitting: Cardiovascular Disease

## 2022-03-29 ENCOUNTER — Ambulatory Visit (INDEPENDENT_AMBULATORY_CARE_PROVIDER_SITE_OTHER): Payer: Commercial Managed Care - PPO | Admitting: Cardiovascular Disease

## 2022-03-29 VITALS — BP 126/64 | HR 76 | Ht 66.0 in | Wt 197.0 lb

## 2022-03-29 DIAGNOSIS — I10 Essential (primary) hypertension: Secondary | ICD-10-CM | POA: Diagnosis not present

## 2022-03-29 DIAGNOSIS — E669 Obesity, unspecified: Secondary | ICD-10-CM

## 2022-03-29 DIAGNOSIS — Z8669 Personal history of other diseases of the nervous system and sense organs: Secondary | ICD-10-CM

## 2022-03-29 DIAGNOSIS — E7800 Pure hypercholesterolemia, unspecified: Secondary | ICD-10-CM

## 2022-03-29 DIAGNOSIS — Z8616 Personal history of COVID-19: Secondary | ICD-10-CM | POA: Diagnosis not present

## 2022-03-29 DIAGNOSIS — I82621 Acute embolism and thrombosis of deep veins of right upper extremity: Secondary | ICD-10-CM

## 2022-03-29 NOTE — Patient Instructions (Signed)
Medication Instructions:  Your Physician recommend you continue on your current medication as directed.    *If you need a refill on your cardiac medications before your next appointment, please call your pharmacy*   Lab Work: None ordered today   Testing/Procedures: None ordered today   Follow-Up: At [facility], you and your health needs are our priority.  As part of our continuing mission to provide you with exceptional heart care, we have created designated Provider Care Teams.  These Care Teams include your primary Cardiologist (physician) and Advanced Practice Providers (APPs -  Physician Assistants and Nurse Practitioners) who all work together to provide you with the care you need, when you need it.  We recommend signing up for the patient portal called "MyChart".  Sign up information is provided on this After Visit Summary.  MyChart is used to connect with patients for Virtual Visits (Telemedicine).  Patients are able to view lab/test results, encounter notes, upcoming appointments, etc.  Non-urgent messages can be sent to your provider as well.   To learn more about what you can do with MyChart, go to [email or web address].    Your next appointment:   As needed  The format for your next appointment:   In Person  Provider:   Dr. Claiborne Billings

## 2022-03-29 NOTE — Progress Notes (Signed)
Cardiology Office Note    Date:  03/29/2022   ID:  Lauren Wolfe, DOB 16-Apr-1976, MRN [id number]  PCP:  Biagio Borg, MD  Cardiologist:  Shelva Majestic, MD   No chief complaint on file.  25 month F/U cardiology evaluation initially referred through the courtesy of Dr. Lamonte Sakai for evaluation of chest pain.  History of Present Illness:  Lauren Wolfe is a 46 y.o. female who presents for a 69 -month follow-up Cardiologic evaluation.   Lauren Wolfe is followed by Dr. Cathlean Cower for primary care.  She was diagnosed with COVID-19 pneumonia in January 2021.  Symptoms were significant with fever to 104 and marked fatigability.  Subsequently she has developed chest pain symptomatology.  Her chest pain does not have aches any exertional component to it.  Can happen anytime throughout the day typically described as sharp, at times continuous, can last for days.  It is consistently been nonexertional.  She experiences some occasional shortness of breath with exertion which has slightly improved.  Because of her recurrent chest pain symptomatology she is referred for cardiac evaluation.  Her history is notable for remote right upper extremity DVT.  She has a history of migraine headaches and has seen Dr. Krista Blue of [facility] neurology.  She has been treated with meloxicam and gabapentin.  She continues to experience fatigue.  She has a history of obesity but admits to an 8 pound weight loss over the last month.    When I saw her for initial evaluation on January 19, 2020 I felt her chest pain was noncardiac in etiology.  She describes it as sharp, at times continuous, and at times can last for days and was characteristically nonexertional.  She admitted to some exertional shortness of breath.  I recommended she undergo an echo Doppler study.  I also discussed her significant hyperlipidemia on prior laboratory assessment.  An echo Doppler study on Feb 05, 2020 showed hyperdynamic LV function with EF  estimated 65 to 70%.  There was grade 1 diastolic dysfunction.  There was normal strain.  I  saw her on 02/19/2020 at which time she denied any recurrent chest pain or exertional shortness of breath.  She was trying to lose weight and had significantly adjusted her diet.  During that evaluation I reviewed her echo Doppler study.  She was on vitamin D supplementation and I discussed the importance of weight loss and exercise.  I last saw her on Feb 19, 2020 at which time she remained stable from a cardiac standpoint.  She has continued to lose weight and has lost 28 pounds since my initial evaluation.  She has a history of hyperlipidemia and I recommended close follow-up with possible initiation of lipid-lowering treatment and follow-up evaluation was elevated.  She has continued to be followed by Dr. Cathlean Cower.  She apparently had seen our pharmacist, Cyril Mourning [place] remotely had been on Praluent 150 mg with an LDL of 90.  Labs drawn 2 months after starting her medication did not show any improvement with LDL unchanged at 98.  Patient was denied changed to Repatha due to no personal history of ASCVD.  Presently, she denies chest pain or shortness of breath.  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  She denies any palpitations.  She was recently given a prescription for [facility] by Dr. Jenny Reichmann to initiate for weight loss but she has not yet initiated this  and is uncertain if she will.  She is on Zetia 10 mg for hyperlipidemia.  She has continued to have issues with migraine headaches and may be going to [facility] for further evaluation.  She presents for cardiology evaluation.  Past Medical History:  Diagnosis Date   Anxiety    Arm vein blood clot, right    Knee pain, left 01/2019   Migraine    Wears glasses    and contacts    Past Surgical History:  Procedure Laterality Date   BREAST BIOPSY  10/31/2012   Procedure:  BREAST BIOPSY WITH NEEDLE LOCALIZATION;  Surgeon: Rolm Bookbinder, MD;  Location: [place];  Service: General;  Laterality: Right;  right breast wire guided excisional biopsy   BREAST EXCISIONAL BIOPSY Right 2014   ganglion cysts  2011   WRIST SURGERY Left     Current Medications: Outpatient Medications Prior to Visit  Medication Sig Dispense Refill   butalbital-acetaminophen-caffeine (FIORICET) 50-325-40 MG tablet Take one every 8 hours, if needed for migraine. No more than 2 per day or 12 per month. No early refills. 12 tablet 5   ezetimibe (ZETIA) 10 MG tablet Take 1 tablet (10 mg total) by mouth daily. 90 tablet 3   levonorgestrel (MIRENA) 20 MCG/24HR IUD 1 each by Intrauterine route once.     ondansetron (ZOFRAN-ODT) 4 MG disintegrating tablet TAKE 1 TABLET BY MOUTH EVERY 8 HOURS AS NEEDED FOR NAUSEA AND VOMITING 9 tablet 8   Rimegepant Sulfate (NURTEC) 75 MG TBDP Take 75 mg by mouth as needed (take 1 tablet for acute headache, max is 75 mg/24 hours). 16 tablet 11   Semaglutide-Weight Management (WEGOVY) 0.5 MG/0.5ML SOAJ Inject 0.5 mg into the skin once a week. 2 mL 11   SUMAtriptan 6 MG/0.5ML SOAJ Take 1 inj at onset of migraine.  May repeat in 2 hrs, if needed.  Max dose: 2 inj/day. This is a 30 day prescription. 6 mL 5   tiZANidine (ZANAFLEX) 4 MG tablet Take 1 tablet (4 mg total) by mouth every 8 (eight) hours as needed (For migraines.). 30 tablet 1   triamcinolone cream (KENALOG) 0.1 % APPLY TO AFFECTED AREA TWICE A DAY 30 g 2   Alirocumab (PRALUENT) 150 MG/ML SOAJ INJECT 150 MG INTO THE SKIN EVERY 14 (FOURTEEN) DAYS. (Patient not taking: Reported on 03/29/2022) 2 mL 11   No facility-administered medications prior to visit.     Allergies:   Azithromycin, Codeine, Hydrocodone, Ketorolac tromethamine, Prednisone, Topiramate, Lipitor [atorvastatin], Rosuvastatin, and Tramadol   Social History   Socioeconomic History   Marital status: Single    Spouse name: Not  on file   Number of children: 1   Years of education: some college   Highest education level: Not on file  Occupational History   Occupation: cust service rep w/ [employer]  Tobacco Use   Smoking status: Never   Smokeless tobacco: Never  Vaping Use   Vaping Use: Never used  Substance and Sexual Activity   Alcohol use: No   Drug use: No   Sexual activity: Yes  Other Topics Concern   Not on file  Social History Narrative   Lives at home with her daughter.   Right-handed.   No caffeine use.   Social Determinants of Health   Financial Resource Strain: Not on file  Food Insecurity: Not on file  Transportation Needs: Not on file  Physical Activity: Not on file  Stress: Not on file  Social Connections: Not on file  Additional social history is notable that she is single and has 1 child a daughter age 33.  There is no history of tobacco use.  She attended [employer].  She currently works at [employer] as an Web designer.  Family History:  The patient's family history includes Aneurysm in her mother; Breast cancer in her cousin, maternal grandmother, and another family member; Diabetes in an other family member; Gout in an other family member; Hypertension in her mother and another family member; Migraines in her paternal aunt; Other in an other family member; Stomach cancer in her father.  Her mother died at age 35 and hypertension unfortunately died secondary to a car accident.  Her father is living at age 53 and has stage IV colon cancer.  ROS General: Negative; No fevers, chills, or night sweats; positive fatigability HEENT: Negative; No changes in vision or hearing, sinus congestion, difficulty swallowing Pulmonary: Covid pneumonia completed a course of remdesivir in January 2021 Cardiovascular: See HPI GI: Negative; No nausea, vomiting, diarrhea, or abdominal pain GU: Negative; No dysuria, hematuria, or difficulty  voiding Musculoskeletal: Negative; no myalgias, joint pain, or weakness Hematologic/Oncology: Negative; no easy bruising, bleeding Endocrine: Negative; no heat/cold intolerance; no diabetes Neuro: Positive for migraine headaches Skin: Negative; No rashes or skin lesions Psychiatric: Negative; No behavioral problems, depression Sleep: Negative; No snoring, daytime sleepiness, hypersomnolence, bruxism, restless legs, hypnogognic hallucinations, no cataplexy Other comprehensive 14 point system review is negative.   PHYSICAL EXAM:   VS:  BP 126/64   Pulse 76   Ht 5' 6"  (1.676 m)   Wt 197 lb (89.4 kg)   SpO2 100%   BMI 31.80 kg/m     Repeat blood pressure by me was 122/70  Wt Readings from Last 3 Encounters:  03/29/22 197 lb (89.4 kg)  02/23/22 199 lb (90.3 kg)  11/03/21 198 lb 8 oz (90 kg)    General: Alert, oriented, no distress.  Skin: normal turgor, no rashes, warm and dry HEENT: Normocephalic, atraumatic. Pupils equal round and reactive to light; sclera anicteric; extraocular muscles intact;  Nose without nasal septal hypertrophy Mouth/Parynx benign; Mallinpatti scale 3 Neck: No JVD, no carotid bruits; normal carotid upstroke Lungs: clear to ausculatation and percussion; no wheezing or rales Chest wall: without tenderness to palpitation Heart: PMI not displaced, RRR, s1 s2 normal, 1/6 systolic murmur, no diastolic murmur, no rubs, gallops, thrills, or heaves Abdomen: soft, nontender; no hepatosplenomehaly, BS+; abdominal aorta nontender and not dilated by palpation. Back: no CVA tenderness Pulses 2+ Musculoskeletal: full range of motion, normal strength, no joint deformities Extremities: no clubbing cyanosis or edema, Homan's sign negative  Neurologic: grossly nonfocal; Cranial nerves grossly wnl Psychologic: Normal mood and affect  Studies/Labs Reviewed:   March 29, 2022 ECG (independently read by me): NSR at 76, no ectopy  Feb 19, 2020   ECG (independently read by  me): NSR at 69, no ectopy, normal inervals  Feb 19, 2020 ECG (independently read by me): NSR at 68; no ectopy, normal intervals  October 27, 2020ECG (independently read by me): Normal sinus rhythm at a 67 bpm.  Nondiagnostic T wave abnormality in lead III, V1 and V2.  Recent Labs:    Latest Ref Rng & Units 02/24/2022    8:07 AM 12/22/2020    9:15 AM 07/30/2020   10:51 AM  BMP  Glucose 70 - 99 mg/dL 82  84  86   BUN 6 - 23 mg/dL 14  11  10  Creatinine 0.40 - 1.20 mg/dL 0.91  0.86  1.02   BUN/Creat Ratio 9 - 23   10   Sodium 135 - 145 mEq/L 137  139  137   Potassium 3.5 - 5.1 mEq/L 3.9  3.7  4.3   Chloride 96 - 112 mEq/L 100  104  101   CO2 19 - 32 mEq/L 29  29  25    Calcium 8.4 - 10.5 mg/dL 9.6  9.5  9.5         Latest Ref Rng & Units 02/24/2022    8:07 AM 11/02/2021    8:47 AM 12/22/2020    9:15 AM  Hepatic Function  Total Protein 6.0 - 8.3 g/dL 7.3  6.9  7.3   Albumin 3.5 - 5.2 g/dL 4.5  4.4  4.5   AST 0 - 37 U/L 15  16  15    ALT 0 - 35 U/L 12  12  12    Alk Phosphatase 39 - 117 U/L 59  77  69   Total Bilirubin 0.2 - 1.2 mg/dL 0.6  0.2  0.6   Bilirubin, Direct 0.0 - 0.3 mg/dL 0.1  <0.10  0.1        Latest Ref Rng & Units 02/24/2022    8:07 AM 12/22/2020    9:15 AM 12/17/2019    9:26 AM  CBC  WBC 4.0 - 10.5 K/uL 6.4  7.2  6.2   Hemoglobin 12.0 - 15.0 g/dL 14.5  14.7  14.6   Hematocrit 36.0 - 46.0 % 43.8  43.7  43.6   Platelets 150.0 - 400.0 K/uL 249.0  258.0  269.0    Lab Results  Component Value Date   MCV 90.7 02/24/2022   MCV 90.1 12/22/2020   MCV 90.3 12/17/2019   Lab Results  Component Value Date   TSH 1.62 02/24/2022   Lab Results  Component Value Date   HGBA1C 5.3 02/24/2022     BNP No results found for: "BNP"  ProBNP No results found for: "PROBNP"   Lipid Panel     Component Value Date/Time   CHOL 190 02/24/2022 0807   CHOL 193 11/02/2021 0846   TRIG 75.0 02/24/2022 0807   HDL 82.50 02/24/2022 0807   HDL 84 11/02/2021 0846   CHOLHDL 2  02/24/2022 0807   VLDL 15.0 02/24/2022 0807   LDLCALC 92 02/24/2022 0807   LDLCALC 96 11/02/2021 0846   LDLDIRECT 164.5 09/19/2012 1052   LABVLDL 13 11/02/2021 0846     RADIOLOGY: No results found.   Additional studies/ records that were reviewed today include:  I reviewed the patient's records from of our pulmonary as well as her hospitalization  ECHO 02/05/2020 IMPRESSIONS   1. Left ventricular ejection fraction, by estimation, is 65 to 70%. The  left ventricle has normal function. The left ventricle has no regional  wall motion abnormalities. Left ventricular diastolic parameters are  consistent with Grade I diastolic  dysfunction (impaired relaxation). The average left ventricular global  longitudinal strain is -22.5 %.   2. Right ventricular systolic function is normal. The right ventricular  size is normal.   3. The mitral valve is normal in structure. No evidence of mitral valve  regurgitation. No evidence of mitral stenosis.   4. The aortic valve is normal in structure. Aortic valve regurgitation is  not visualized. No aortic stenosis is present.   ASSESSMENT:    1. Essential hypertension   2. Pure hypercholesterolemia   3. History of COVID-19  4. History of migraine headaches   5. H/O right arm DVT (deep venous thromboembolism), ([place])   6. Mild obesity     PLAN:  Lauren Wolfe is a 46 year old African-American female who has a remote history for right upper extremity DVT and developed Covid in January 2021 associated with fevers up to 104 cough and marked fatigability.  She completed a full course of remdesivir.  Subsequent chest x-rays have not shown acute abnormality and repeat CT imaging in February 2021 did not show evidence for pulmonary embolism and showed mild right middle lobe, lingular and left lower lobe atelectasis which was improved from January.  When I saw her for initial evaluation, she had noticed some episodes of nonexertional chest pain which  initiated 2 weeks after her hospitalization.  Her pain was atypical for ischemic heart disease and was described as sharp, at times continuous, and at times can last for days.  Since she had suffered Covid, I recommended she undergo a follow-up echo Doppler study to make certain she did not have any delayed myocarditis or subsequent development of LV dysfunction.  Her echo Doppler study demonstrated an EF of 65 to 70%.  There was grade 1 diastolic dysfunction.  She had normal valvular architecture.  She has hyperlipidemia on laboratory from March 2017 revealed an LDL cholesterol elevated at 150. I discussed with her the atherogenic potential of that LDL level.  She has significantly adjusted her diet.  Since I last saw her, she apparently was statin intolerant and ultimately was initiated on Praluent but was unresponsive to therapy.  Due to not having a personal history of ASCVD insurance denied Repatha.  Presently she is on Zetia.  Her diet has improved.  Lipid studies from Feb 24, 2022 showed an LDL cholesterol at 92.  If future therapy is necessary she may benefit from initiation of bempedoic acid with Zetia combination but I will not start this presently.  Her blood pressure today is stable.  She was recently given a prescription for [facility] Dr. Jenny Reichmann but has not started this yet and is uncertain if she will start treatment.  She continues to be bothered by her migraine headaches and tells me she may be going to [facility] for [facility] evaluation.  Presently she is stable cardiovascularly.  She will return to the care of Dr. Jenny Reichmann.  I will see her on an as-needed basis if problems occur..   Medication Adjustments/Labs and Tests Ordered: Current medicines are reviewed at length with the patient today.  Concerns regarding medicines are outlined above.  Medication changes, Labs and Tests ordered today are listed in the Patient Instructions below. Patient Instructions  Medication Instructions:  Your Physician recommend  you continue on your current medication as directed.    *If you need a refill on your cardiac medications before your next appointment, please call your pharmacy*   Lab Work: None ordered today   Testing/Procedures: None ordered today   Follow-Up: At [facility], you and your health needs are our priority.  As part of our continuing mission to provide you with exceptional heart care, we have created designated Provider Care Teams.  These Care Teams include your primary Cardiologist (physician) and Advanced Practice Providers (APPs -  Physician Assistants and Nurse Practitioners) who all work together to provide you with the care you need, when you need it.  We recommend signing up for the patient portal called "MyChart".  Sign up information is provided on this After Visit Summary.  MyChart is  used to connect with patients for Virtual Visits (Telemedicine).  Patients are able to view lab/test results, encounter notes, upcoming appointments, etc.  Non-urgent messages can be sent to your provider as well.   To learn more about what you can do with MyChart, go to [email or web address].    Your next appointment:   As needed  The format for your next appointment:   In Person  Provider:   Dr. Claiborne Billings           Signed, Shelva Majestic, MD  03/29/2022 6:11 PM    [place] [address] [place], [place], [place]  [address] Phone: [phone]

## 2022-04-07 DIAGNOSIS — Z1231 Encounter for screening mammogram for malignant neoplasm of breast: Secondary | ICD-10-CM
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
## 2022-04-12 ENCOUNTER — Ambulatory Visit (INDEPENDENT_AMBULATORY_CARE_PROVIDER_SITE_OTHER): Payer: Commercial Managed Care - PPO | Admitting: Neurology

## 2022-04-12 ENCOUNTER — Telehealth: Payer: Self-pay | Admitting: Neurology

## 2022-04-12 ENCOUNTER — Encounter: Payer: Self-pay | Admitting: Neurology

## 2022-04-12 VITALS — BP 142/92 | HR 90 | Ht 66.0 in | Wt 195.5 lb

## 2022-04-12 DIAGNOSIS — G43811 Other migraine, intractable, with status migrainosus: Secondary | ICD-10-CM | POA: Diagnosis not present

## 2022-04-12 MED ORDER — NURTEC 75 MG PO TBDP: 75.0000 mg | ORAL_TABLET | ORAL | 11 refills | Status: DC

## 2022-04-12 NOTE — Telephone Encounter (Signed)
Lauren Wolfe, can we please work on getting the patient set up for Botox for chronic migraine headaches.  Please see recent office note for tried and failed.

## 2022-04-12 NOTE — Patient Instructions (Signed)
I will try to get you set up for Botox Take Nurtec every other day as prevention Use Fioricet or Imitrex for acute headache  See you back in few months for Botox

## 2022-04-21 DIAGNOSIS — Z1231 Encounter for screening mammogram for malignant neoplasm of breast: Secondary | ICD-10-CM

## 2022-05-02 ENCOUNTER — Encounter: Payer: Self-pay | Admitting: Neurology

## 2022-05-02 ENCOUNTER — Encounter: Payer: Self-pay | Admitting: Pharmacist Clinician (PhC)/ Clinical Pharmacy Specialist

## 2022-05-12 ENCOUNTER — Ambulatory Visit
Admission: RE | Admit: 2022-05-12 | Discharge: 2022-05-12 | Disposition: A | Discharge status: Home / Self Care | Payer: Commercial Managed Care - PPO | Source: Ambulatory Visit | Attending: Obstetrics and Gynecology | Admitting: Obstetrics and Gynecology

## 2022-05-12 DIAGNOSIS — Z1231 Encounter for screening mammogram for malignant neoplasm of breast: Secondary | ICD-10-CM

## 2022-05-18 ENCOUNTER — Telehealth: Payer: Self-pay

## 2022-05-18 NOTE — Telephone Encounter (Signed)
I spoke with Olegario Messier at [facility] who approved Botox for the patient. Reference #: [id number]. The authorization number is: [phone]. It has been approved for one year. 05/18/2022-05/19/2023.  Speciality pharmacy is required. The preferred pharmacy is [facility]. The SP number is #: [id number].  A PA has been started on CMM. (Key: [id number]). It is awaiting approval.

## 2022-05-22 NOTE — Addendum Note (Signed)
Addended by: Christophe Louis E on: 05/22/2022 08:02 AM   Modules accepted: Orders

## 2022-05-22 NOTE — Telephone Encounter (Signed)
Botox appointment with Maralyn Sago has been made with pt over the phone and is scheduled for 8/29

## 2022-05-22 NOTE — Telephone Encounter (Signed)
The PA request for Botox 200UNIT solution has been approved. This request is approved from 05/19/2022 to 11/15/2022.

## 2022-05-25 ENCOUNTER — Telehealth: Payer: Self-pay | Admitting: Neurology

## 2022-05-25 NOTE — Telephone Encounter (Signed)
Pt rescheduled Botox appt due can not take weave out until after meeting at work.

## 2022-05-25 NOTE — Telephone Encounter (Addendum)
I called [facility] to schedule [facility]otox deliver. I spoke with Fleet Contras. [facility] does not dispense [facility] any longer. She asked that I call the insurance company again to find another preferred SP.  I called [facility]. This was a 17 minute phone call. I spoke with Dow Adolph. She reports that we do not need to use a specialty pharmacy but can do B/B. Ref # for this call is [id number].

## 2022-05-29 MED ORDER — BOTOX 200 UNITS IJ SOLR: 200.0000 [IU] | INTRAMUSCULAR | 3 refills | Status: DC

## 2022-05-29 NOTE — Addendum Note (Signed)
Addended by: Glean Salvo on: 05/29/2022 05:49 AM   Modules accepted: Orders

## 2022-05-30 ENCOUNTER — Ambulatory Visit: Payer: Commercial Managed Care - PPO | Admitting: Neurology

## 2022-05-31 ENCOUNTER — Ambulatory Visit (INDEPENDENT_AMBULATORY_CARE_PROVIDER_SITE_OTHER): Payer: Commercial Managed Care - PPO | Admitting: Neurology

## 2022-05-31 VITALS — BP 136/98 | HR 79 | Ht 66.0 in | Wt 190.5 lb

## 2022-05-31 DIAGNOSIS — G43709 Chronic migraine without aura, not intractable, without status migrainosus: Secondary | ICD-10-CM | POA: Diagnosis not present

## 2022-05-31 MED ORDER — ONABOTULINUMTOXINA 200 UNITS IJ SOLR
155.0000 [IU] | Freq: Once | INTRAMUSCULAR | Status: AC
  Administered 2022-05-31: 155 [IU] via INTRAMUSCULAR

## 2022-05-31 NOTE — Progress Notes (Signed)
    BOTOX PROCEDURE NOTE FOR MIGRAINE HEADACHE   HISTORY: Lauren Wolfe presents today for her first Botox injection for chronic migraine headache.  She continues with near daily headache.  She is on Nurtec for prevention.  She has done Botox in the past about 10 years ago, had variable benefit.  Description of procedure:  The patient was placed in a sitting position. The standard protocol was used for Botox as follows, with 5 units of Botox injected at each site:   -Procerus muscle, midline injection  -Corrugator muscle, bilateral injection  -Frontalis muscle, bilateral injection, with 2 sites each side, medial injection was performed in the upper one third of the frontalis muscle, in the region vertical from the medial inferior edge of the superior orbital rim. The lateral injection was again in the upper one third of the forehead vertically above the lateral limbus of the cornea, 1.5 cm lateral to the medial injection site.  -Temporalis muscle injection, 4 sites, bilaterally. The first injection was 3 cm above the tragus of the ear, second injection site was 1.5 cm to 3 cm up from the first injection site in line with the tragus of the ear. The third injection site was 1.5-3 cm forward between the first 2 injection sites. The fourth injection site was 1.5 cm posterior to the second injection site.  -Occipitalis muscle injection, 3 sites, bilaterally. The first injection was done one half way between the occipital protuberance and the tip of the mastoid process behind the ear. The second injection site was done lateral and superior to the first, 1 fingerbreadth from the first injection. The third injection site was 1 fingerbreadth superiorly and medially from the first injection site.  -Cervical paraspinal muscle injection, 2 sites, bilateral, the first injection site was 1 cm from the midline of the cervical spine, 3 cm inferior to the lower border of the occipital protuberance. The  second injection site was 1.5 cm superiorly and laterally to the first injection site.  -Trapezius muscle injection was performed at 3 sites, bilaterally. The first injection site was in the upper trapezius muscle halfway between the inflection point of the neck, and the acromion. The second injection site was one half way between the acromion and the first injection site. The third injection was done between the first injection site and the inflection point of the neck.   A 200 unit bottle of Botox was used, 155 units were injected, the rest of the Botox was wasted. The patient tolerated the procedure well, there were no complications of the above procedure.  Botox NDC 5732-2025-42 Lot number [id number] Expiration date 11/2024 B/B

## 2022-05-31 NOTE — Progress Notes (Signed)
Botox 200 units x 1 vial  LOT#: [id number] NDC: 0023-3921-02 EXP: 2026/02  B/B 

## 2022-06-21 ENCOUNTER — Other Ambulatory Visit: Payer: Self-pay | Admitting: Neurology

## 2022-06-22 ENCOUNTER — Other Ambulatory Visit: Payer: Self-pay | Admitting: Neurology

## 2022-06-22 NOTE — Telephone Encounter (Signed)
Error

## 2022-06-29 ENCOUNTER — Encounter: Payer: Self-pay | Admitting: Internal Medicine

## 2022-07-17 ENCOUNTER — Encounter: Payer: Self-pay | Admitting: Pharmacist Clinician (PhC)/ Clinical Pharmacy Specialist

## 2022-07-17 DIAGNOSIS — E7800 Pure hypercholesterolemia, unspecified: Secondary | ICD-10-CM

## 2022-07-26 ENCOUNTER — Other Ambulatory Visit: Payer: Self-pay | Admitting: Neurology

## 2022-07-28 ENCOUNTER — Other Ambulatory Visit: Payer: Self-pay

## 2022-07-28 MED ORDER — BUTALBITAL-APAP-CAFFEINE 50-325-40 MG PO TABS: ORAL_TABLET | ORAL | 5 refills | Status: DC

## 2022-08-11 ENCOUNTER — Encounter: Payer: Self-pay | Admitting: Emergency Medicine

## 2022-08-11 ENCOUNTER — Ambulatory Visit
Admission: EM | Admit: 2022-08-11 | Discharge: 2022-08-11 | Disposition: A | Discharge status: Home / Self Care | Payer: Commercial Managed Care - PPO

## 2022-08-11 ENCOUNTER — Ambulatory Visit (INDEPENDENT_AMBULATORY_CARE_PROVIDER_SITE_OTHER): Payer: Commercial Managed Care - PPO

## 2022-08-11 DIAGNOSIS — S99921A Unspecified injury of right foot, initial encounter: Secondary | ICD-10-CM

## 2022-08-11 DIAGNOSIS — M79674 Pain in right toe(s): Secondary | ICD-10-CM

## 2022-08-11 DIAGNOSIS — S92511A Displaced fracture of proximal phalanx of right lesser toe(s), initial encounter for closed fracture: Secondary | ICD-10-CM

## 2022-08-11 DIAGNOSIS — M79671 Pain in right foot: Secondary | ICD-10-CM

## 2022-08-11 MED ORDER — CELECOXIB 100 MG PO CAPS: 100.0000 mg | ORAL_CAPSULE | Freq: Two times a day (BID) | ORAL | 0 refills | Status: DC

## 2022-08-11 NOTE — ED Triage Notes (Signed)
Stubbed pinky toe right foot on 08/02/2022. Pain is now spreading across the ball of right foot. States she's on her feet a lot at work. Has been buddy taping them, tight shoes ease pain, pain worsens when shoe comes off.

## 2022-08-11 NOTE — ED Provider Notes (Signed)
[facility]  Note:  This document was prepared using Systems analyst and may include unintentional dictation errors.  MRN: [id number] DOB: 1975/10/16  Subjective:   Lauren Wolfe is a 46 y.o. female presenting for 10-day history of continued right foot pain with swelling and bruising.  Patient suffered an injury by stubbing her right pinky toe and has had the symptoms since.  She has been using buddy tape system but unfortunately has not been able to rest off of her feet.  Does a lot of walking or standing on her feet.  She has not taken many oral medications that she cannot tolerate them very well at all.  No current facility-administered medications for this encounter.  Current Outpatient Medications:    Botulinum Toxin Type A, Cosm, 100 units SOLR, Inject into the muscle., Disp: , Rfl:    butalbital-acetaminophen-caffeine (FIORICET) 50-325-40 MG tablet, TAKE ONE EVERY 8 HOURS, IF NEEDED FOR MIGRAINE. NO MORE THAN 2 PER DAY OR 12 PER MONTH. NO EARLY REFILLS., Disp: 12 tablet, Rfl: 5   levonorgestrel (MIRENA) 20 MCG/24HR IUD, 1 each by Intrauterine route once., Disp: , Rfl:    ondansetron (ZOFRAN-ODT) 4 MG disintegrating tablet, TAKE 1 TABLET BY MOUTH EVERY 8 HOURS AS NEEDED FOR NAUSEA AND VOMITING, Disp: 9 tablet, Rfl: 8   Rimegepant Sulfate (NURTEC) 75 MG TBDP, Take 75 mg by mouth every other day., Disp: 16 tablet, Rfl: 11   SUMAtriptan 6 MG/0.5ML SOAJ, Take 1 inj at onset of migraine.  May repeat in 2 hrs, if needed.  Max dose: 2 inj/day. This is a 30 day prescription., Disp: 6 mL, Rfl: 5   tiZANidine (ZANAFLEX) 4 MG tablet, Take 1 tablet (4 mg total) by mouth every 8 (eight) hours as needed (For migraines.)., Disp: 30 tablet, Rfl: 1   triamcinolone cream (KENALOG) 0.1 %, APPLY TO AFFECTED AREA TWICE A DAY, Disp: 30 g, Rfl: 2   Allergies  Allergen Reactions   Azithromycin Shortness Of Breath, Rash and Other (See Comments)    Prurutis    Codeine Shortness Of Breath, Itching and Rash   Hydrocodone Shortness Of Breath, Itching and Rash   Ketorolac Tromethamine Shortness Of Breath, Itching and Rash   Prednisone Shortness Of Breath   Topiramate Shortness Of Breath   Lipitor [Atorvastatin]     MYALGIAS   Rosuvastatin Other (See Comments)    Body aches   Tramadol Itching    Past Medical History:  Diagnosis Date   Anxiety    Arm vein blood clot, right    Knee pain, left 01/2019   Migraine    Wears glasses    and contacts     Past Surgical History:  Procedure Laterality Date   BREAST BIOPSY  10/31/2012   Procedure: BREAST BIOPSY WITH NEEDLE LOCALIZATION;  Surgeon: Rolm Bookbinder, MD;  Location: [place];  Service: General;  Laterality: Right;  right breast wire guided excisional biopsy   BREAST EXCISIONAL BIOPSY Right 2014   ganglion cysts  2011   WRIST SURGERY Left     Family History  Problem Relation Age of Onset   Hypertension Mother    Aneurysm Mother    Stomach cancer Father    Other Other        grandparent with DJD   Hypertension Other        grandparent   Diabetes Other        grandparent   Gout Other  grandfather   Breast cancer Other        grandmother   Migraines Paternal Aunt    Breast cancer Cousin    Breast cancer Maternal Grandmother     Social History   Tobacco Use   Smoking status: Never   Smokeless tobacco: Never  Vaping Use   Vaping Use: Never used  Substance Use Topics   Alcohol use: No   Drug use: No    ROS   Objective:   Vitals: BP (!) 159/104 (BP Location: Left Arm)   Pulse 65   Temp 98.5 F (36.9 C) (Oral)   Resp 16   SpO2 97%   Physical Exam Constitutional:      General: She is not in acute distress.    Appearance: Normal appearance. She is well-developed. She is not ill-appearing, toxic-appearing or diaphoretic.  HENT:     Head: Normocephalic and atraumatic.     Nose: Nose normal.     Mouth/Throat:     Mouth: Mucous  membranes are moist.  Eyes:     General: No scleral icterus.       Right eye: No discharge.        Left eye: No discharge.     Extraocular Movements: Extraocular movements intact.  Cardiovascular:     Rate and Rhythm: Normal rate.  Pulmonary:     Effort: Pulmonary effort is normal.  Musculoskeletal:       Feet:  Skin:    General: Skin is warm and dry.  Neurological:     General: No focal deficit present.     Mental Status: She is alert and oriented to person, place, and time.  Psychiatric:        Mood and Affect: Mood normal.        Behavior: Behavior normal.        Thought Content: Thought content normal.        Judgment: Judgment normal.    DG Foot Complete Right  Result Date: 08/11/2022 CLINICAL DATA:  Stubbed pinky toe EXAM: RIGHT FOOT COMPLETE - 3+ VIEW COMPARISON:  None Available. FINDINGS: Acute to subacute minimally displaced fracture involving the proximal shaft of fifth proximal phalanx. No subluxation. Positive for soft tissue swelling IMPRESSION: Acute to subacute minimally displaced fracture involving the fifth proximal phalanx. Electronically Signed   By: Jasmine Pang M.D.   On: 08/11/2022 16:39    Assessment and Plan :   PDMP not reviewed this encounter.  1. Closed displaced fracture of proximal phalanx of lesser toe of right foot, initial encounter   2. Toe pain, right   3. Right foot pain     Patient placed into a postop shoe, continue buddy tape system.  We considered several medications for pain control, patient prefers to avoid NSAIDs generally and narcotic pain medicines.  However, we agreed to use celecoxib to avoid GI side effects.  Recommended follow-up with podiatry.  Referral was placed. Counseled patient on potential for adverse effects with medications prescribed/recommended today, ER and return-to-clinic precautions discussed, patient verbalized understanding.    Wallis Bamberg, PA-C 08/11/22 1734

## 2022-08-11 NOTE — ED Provider Notes (Signed)
Patient presents with injury to right foot that occurred about 9 days ago. She reports that she continues to have worsening pain with weight bearing. Recommend imaging- options provided [facility] vs evaluation in another facility where she would not have to return for treatment. Patient prefers to be seen at alternate urgent care. Notified [facility] of pending arrival.    Lauren Wolfe 08/11/22 1555

## 2022-08-14 ENCOUNTER — Telehealth: Payer: Self-pay

## 2022-08-14 NOTE — Telephone Encounter (Signed)
Patient arrived to clinic stating when taking Celebrex (prescribed on her last visit) she began to have bilateral hand swelling and redness. Patient does not display signs/ symptoms of respiratory distress. The patient was made aware per the provider onsite she would have to be evaluated. Patient states she will come back tomorrow and appt was made. Patient encouraged not to take Celebrex at this time.

## 2022-08-15 ENCOUNTER — Ambulatory Visit
Admission: RE | Admit: 2022-08-15 | Discharge: 2022-08-15 | Disposition: A | Discharge status: Home / Self Care | Payer: Commercial Managed Care - PPO | Source: Ambulatory Visit | Attending: Emergency Medicine | Admitting: Emergency Medicine

## 2022-08-15 VITALS — BP 144/90 | HR 75 | Temp 98.3°F | Resp 16

## 2022-08-15 DIAGNOSIS — S92511D Displaced fracture of proximal phalanx of right lesser toe(s), subsequent encounter for fracture with routine healing: Secondary | ICD-10-CM

## 2022-08-15 MED ORDER — ACETAMINOPHEN 500 MG PO TABS: 1000.0000 mg | ORAL_TABLET | Freq: Three times a day (TID) | ORAL | 0 refills | Status: AC

## 2022-08-15 NOTE — ED Triage Notes (Signed)
Patient arrived to clinic stating when taking Celebrex (prescribed on her last visit) she began to have bilateral hand swelling and redness. The patient is still having pain to the right pinky toe.   Started: 2 weeks ago

## 2022-08-15 NOTE — Discharge Instructions (Signed)
At this point, I believe that your safest and best option for pain relief is 3000 mg of Tylenol per day, 1000 mg taken every 8 hours.  I sent a prescription to your pharmacy.  I provided you with a note to be out of work if you decide you might need to use it.  I am glad you have an appointment with podiatry this coming Friday.  I wish you all the best in your recovery.

## 2022-08-15 NOTE — ED Provider Notes (Signed)
[facility]    CSN: [id number] Arrival date & time: 08/15/22  7001    HISTORY   Chief Complaint  Patient presents with   Allergic Reaction    Entered by patient   Toe Pain   HPI Lauren Wolfe is a pleasant, 46 y.o. female who presents to urgent care today. Patient presented to urgent care on November 10 complaining of toe pain, patient was found to have a closed, minimally displaced fracture of the proximal phalanx of the lesser toe of her right foot with routine healing.  Patient apparently had stubbed her toe against furniture 10 days prior to presentation and had been keeping the toe buddy taped, not taking any medication for her pain.  Patient has been seen by sports medicine for other joint pain injuries in the past, has been prescribed ibuprofen and indomethacin multiple times with no documented allergic reaction however ketorolac was listed in her chart in 2011 with a reaction of shortness of breath, itching and rash.  Patient was provided with a prescription for Celebrex for pain relief on November 10.  Patient's presents today complaining of swelling and redness in both of her hands a few days after beginning Celebrex, states she has discontinued and the redness and swelling have improved.  The history is provided by the patient.   Past Medical History:  Diagnosis Date   Anxiety    Arm vein blood clot, right    Knee pain, left 01/2019   Migraine    Wears glasses    and contacts   Patient Active Problem List   Diagnosis Date Noted   Obesity (BMI 30-39.9) 02/26/2022   Myalgia due to statin 02/23/2021   Deep venous thrombosis of upper extremity (HCC) 12/22/2020   Encounter for well adult exam with abnormal findings 12/22/2020   Vitamin D deficiency 12/22/2020   Atypical chest pain 01/15/2020   Chest pain, musculoskeletal 11/26/2019   Cough 10/29/2019   SOB (shortness of breath) 10/27/2019   Pneumonia due to COVID-19 virus 10/20/2019   History of  COVID-19 10/18/2019   Abnormal chest CT 04/21/2019   Chest pain 04/21/2019   Hypertrophy, fat pad, infrapatellar 03/20/2019   Bilateral post-traumatic osteoarthritis of knee 03/04/2019   Left groin pain 03/04/2019   Low back pain 03/04/2019   HLD (hyperlipidemia) 10/21/2018   Chronic migraine w/o aura w/o status migrainosus, not intractable 04/23/2017   Rash 04/19/2017   Arm DVT (deep venous thromboembolism), acute, right (HCC) 02/22/2017   Occipital headache 07/17/2013   Nausea 06/23/2013   Impingement syndrome of right shoulder 03/04/2013   Preventative health care 09/24/2012   Essential hypertension 06/16/2009   Anxiety state 05/23/2009   DEPRESSION 05/23/2009   INSOMNIA-SLEEP DISORDER-UNSPEC 05/19/2009   Migraine with intractable migraine, so stated, with status migrainosus 04/07/2009   ALOPECIA 11/30/2008   Past Surgical History:  Procedure Laterality Date   BREAST BIOPSY  10/31/2012   Procedure: BREAST BIOPSY WITH NEEDLE LOCALIZATION;  Surgeon: Emelia Loron, MD;  Location: [facility];  Service: General;  Laterality: Right;  right breast wire guided excisional biopsy   BREAST EXCISIONAL BIOPSY Right 2014   ganglion cysts  2011   WRIST SURGERY Left    OB History   No obstetric history on file.    Home Medications    Prior to Admission medications   Medication Sig Start Date End Date Taking? Authorizing Provider  acetaminophen (TYLENOL) 500 MG tablet Take 2 tablets (1,000 mg total) by mouth every 8 (eight)  hours for 30 doses. 08/15/22 08/25/22 Yes Theadora Rama Scales, PA-C  Botulinum Toxin Type A, Cosm, 100 units SOLR Inject into the muscle.    [provider]  butalbital-acetaminophen-caffeine (FIORICET) 50-325-40 MG tablet TAKE ONE EVERY 8 HOURS, IF NEEDED FOR MIGRAINE. NO MORE THAN 2 PER DAY OR 12 PER MONTH. NO EARLY REFILLS. 07/28/22   Glean Salvo, NP  levonorgestrel (MIRENA) 20 MCG/24HR IUD 1 each by Intrauterine route once.     [provider]  ondansetron (ZOFRAN-ODT) 4 MG disintegrating tablet TAKE 1 TABLET BY MOUTH EVERY 8 HOURS AS NEEDED FOR NAUSEA AND VOMITING 06/22/22   Glean Salvo, NP  Rimegepant Sulfate (NURTEC) 75 MG TBDP Take 75 mg by mouth every other day. 04/12/22   Glean Salvo, NP  SUMAtriptan 6 MG/0.5ML SOAJ Take 1 inj at onset of migraine.  May repeat in 2 hrs, if needed.  Max dose: 2 inj/day. This is a 30 day prescription. 08/22/21   Levert Feinstein, MD  tiZANidine (ZANAFLEX) 4 MG tablet Take 1 tablet (4 mg total) by mouth every 8 (eight) hours as needed (For migraines.). 11/03/21   Glean Salvo, NP  triamcinolone cream (KENALOG) 0.1 % APPLY TO AFFECTED AREA TWICE A DAY 03/22/21   Corwin Levins, MD    Family History Family History  Problem Relation Age of Onset   Hypertension Mother    Aneurysm Mother    Stomach cancer Father    Other Other        grandparent with DJD   Hypertension Other        grandparent   Diabetes Other        grandparent   Gout Other        grandfather   Breast cancer Other        grandmother   Migraines Paternal Aunt    Breast cancer Cousin    Breast cancer Maternal Grandmother    Social History Social History   Tobacco Use   Smoking status: Never   Smokeless tobacco: Never  Vaping Use   Vaping Use: Never used  Substance Use Topics   Alcohol use: No   Drug use: No   Allergies   Azithromycin, Celebrex [celecoxib], Codeine, Hydrocodone, Ketorolac tromethamine, Prednisone, Topiramate, Lipitor [atorvastatin], Rosuvastatin, and Tramadol  Review of Systems Review of Systems Pertinent findings revealed after performing a 14 point review of systems has been noted in the history of present illness.  Physical Exam Triage Vital Signs ED Triage Vitals  Enc Vitals Group     BP 07/29/21 0827 (!) 147/82     Pulse Rate 07/29/21 0827 72     Resp 07/29/21 0827 18     Temp 07/29/21 0827 98.3 F (36.8 C)     Temp Source 07/29/21 0827 Oral     SpO2 07/29/21  0827 98 %     Weight --      Height --      Head Circumference --      Peak Flow --      Pain Score 07/29/21 0826 5     Pain Loc --      Pain Edu? --      Excl. in GC? --   No data found.  Updated Vital Signs BP (!) 144/90 (BP Location: Left Arm)   Pulse 75   Temp 98.3 F (36.8 C) (Oral)   Resp 16   SpO2 97%   Physical Exam Constitutional:      General: She  is not in acute distress.    Appearance: Normal appearance. She is well-developed. She is not ill-appearing, toxic-appearing or diaphoretic.  HENT:     Head: Normocephalic and atraumatic.     Nose: Nose normal.     Mouth/Throat:     Mouth: Mucous membranes are moist.  Eyes:     General: No scleral icterus.       Right eye: No discharge.        Left eye: No discharge.     Extraocular Movements: Extraocular movements intact.  Cardiovascular:     Rate and Rhythm: Normal rate.  Pulmonary:     Effort: Pulmonary effort is normal.  Musculoskeletal:       Feet:  Skin:    General: Skin is warm and dry.     Comments: Mild erythema without tenderness to palpation on dorsal and volar aspects of both hands without signs of edema, excoriation   Neurological:     General: No focal deficit present.     Mental Status: She is alert and oriented to person, place, and time.  Psychiatric:        Mood and Affect: Mood normal.        Behavior: Behavior normal.        Thought Content: Thought content normal.        Judgment: Judgment normal.     Visual Acuity Right Eye Distance:   Left Eye Distance:   Bilateral Distance:    Right Eye Near:   Left Eye Near:    Bilateral Near:     UC Couse / Diagnostics / Procedures:     Radiology No results found.  Procedures Procedures (including critical care time) EKG  Pending results:  Labs Reviewed - No data to display  Medications Ordered in UC: Medications - No data to display  UC Diagnoses / Final Clinical Impressions(s)   I have reviewed the triage vital signs and  the nursing notes.  Pertinent labs & imaging results that were available during my care of the patient were reviewed by me and considered in my medical decision making (see chart for details).    Final diagnoses:  Closed displaced fracture of proximal phalanx of lesser toe of right foot with routine healing, subsequent encounter   Patient advised that given her lack of intolerance to ibuprofen in the past, she is welcome to try taking that however I am hesitant to provide her with any other NSAIDs for treatment of her pain.  Patient was educated regarding the benefit of scheduled dosing of acetaminophen and that she will notice pain relief after taking second or third dose.  Patient advised to follow-up with orthopedics if no better in the next 2 to 3 days.  Repeat x-ray not indicated due to decreased swelling appreciated on exam.  ED Prescriptions     Medication Sig Dispense Auth. Provider   acetaminophen (TYLENOL) 500 MG tablet Take 2 tablets (1,000 mg total) by mouth every 8 (eight) hours for 30 doses. 60 tablet Theadora RamaMorgan, Azazel Franze Scales, PA-C      PDMP not reviewed this encounter.  Pending results:  Labs Reviewed - No data to display  Discharge Instructions:   Discharge Instructions      At this point, I believe that your safest and best option for pain relief is 3000 mg of Tylenol per day, 1000 mg taken every 8 hours.  I sent a prescription to your pharmacy.  I provided you with a note to be out of work  if you decide you might need to use it.  I am glad you have an appointment with podiatry this coming Friday.  I wish you all the best in your recovery.    Disposition Upon Discharge:  Condition: stable for discharge home  Patient presented with an acute illness with associated systemic symptoms and significant discomfort requiring urgent management. In my opinion, this is a condition that a prudent lay person (someone who possesses an average knowledge of health and medicine)  may potentially expect to result in complications if not addressed urgently such as respiratory distress, impairment of bodily function or dysfunction of bodily organs.   Routine symptom specific, illness specific and/or disease specific instructions were discussed with the patient and/or caregiver at length.   As such, the patient has been evaluated and assessed, work-up was performed and treatment was provided in alignment with urgent care protocols and evidence based medicine.  Patient/parent/caregiver has been advised that the patient may require follow up for further testing and treatment if the symptoms continue in spite of treatment, as clinically indicated and appropriate.  Patient/parent/caregiver has been advised to return to the [facility] or PCP if no better; to PCP or the Emergency Department if new signs and symptoms develop, or if the current signs or symptoms continue to change or worsen for further workup, evaluation and treatment as clinically indicated and appropriate  The patient will follow up with their current PCP if and as advised. If the patient does not currently have a PCP we will assist them in obtaining one.   The patient may need specialty follow up if the symptoms continue, in spite of conservative treatment and management, for further workup, evaluation, consultation and treatment as clinically indicated and appropriate.   Patient/parent/caregiver verbalized understanding and agreement of plan as discussed.  All questions were addressed during visit.  Please see discharge instructions below for further details of plan.  This office note has been dictated using Teaching laboratory technician.  Unfortunately, this method of dictation can sometimes lead to typographical or grammatical errors.  I apologize for your inconvenience in advance if this occurs.  Please do not hesitate to reach out to me if clarification is needed.      Theadora Rama Scales, [place] 08/17/22  [phone]

## 2022-08-18 ENCOUNTER — Encounter: Payer: Self-pay | Admitting: Podiatry

## 2022-08-18 ENCOUNTER — Ambulatory Visit (INDEPENDENT_AMBULATORY_CARE_PROVIDER_SITE_OTHER): Payer: Commercial Managed Care - PPO | Admitting: Podiatry

## 2022-08-18 ENCOUNTER — Ambulatory Visit (INDEPENDENT_AMBULATORY_CARE_PROVIDER_SITE_OTHER): Payer: Commercial Managed Care - PPO

## 2022-08-18 DIAGNOSIS — S99921A Unspecified injury of right foot, initial encounter: Secondary | ICD-10-CM

## 2022-08-18 DIAGNOSIS — S92514A Nondisplaced fracture of proximal phalanx of right lesser toe(s), initial encounter for closed fracture: Secondary | ICD-10-CM

## 2022-08-21 ENCOUNTER — Other Ambulatory Visit: Payer: Self-pay

## 2022-08-21 DIAGNOSIS — E7800 Pure hypercholesterolemia, unspecified: Secondary | ICD-10-CM

## 2022-08-21 NOTE — Progress Notes (Signed)
Subjective:   Patient ID: Lauren Wolfe, female   DOB: 46 y.o.   MRN: [id number]   HPI Patient states she had a lot of pain with her right fifth toe and she had an injury 3 weeks ago where she severely traumatized it.  States its hard to wear any type of shoe gear and she is concerned about this.  Patient does not smoke likes to be active   Review of Systems  All other systems reviewed and are negative.       Objective:  Physical Exam Vitals and nursing note reviewed.  Constitutional:      Appearance: She is well-developed.  Pulmonary:     Effort: Pulmonary effort is normal.  Musculoskeletal:        General: Normal range of motion.  Skin:    General: Skin is warm.  Neurological:     Mental Status: She is alert.     Neurovascular status intact muscle strength adequate range of motion adequate with exquisite discomfort right fifth digit at the plantar portion of the toe proximal phalanx and into the MPJ.  Patient has good digital perfusion well-oriented x3     Assessment:  Probability for trauma and fracture of the right fifth toe proximal phalanx     Plan:  H&P x-ray reviewed discussed fracture educating her on this and it should heal uneventfully but may take another 6 to 8 weeks.  Continue ice therapy anti-inflammatories as needed and patient will be rechecked if symptoms persist and may require some more types of treatment  X-rays indicate fracture of the base of the fifth digit right showing signs of healing

## 2022-08-22 ENCOUNTER — Other Ambulatory Visit: Payer: Self-pay | Admitting: Podiatry

## 2022-08-22 DIAGNOSIS — S92514A Nondisplaced fracture of proximal phalanx of right lesser toe(s), initial encounter for closed fracture: Secondary | ICD-10-CM

## 2022-08-22 LAB — LIPID PANEL
Chol/HDL Ratio: 3.3 ratio (ref 0.0–4.4)
Cholesterol, Total: 305 mg/dL — ABNORMAL HIGH (ref 100–199)
HDL: 92 mg/dL (ref >39)
LDL Chol Calc (NIH): 200 mg/dL — ABNORMAL HIGH (ref 0–99)
Triglycerides: 81 mg/dL (ref 0–149)
VLDL Cholesterol Cal: 13 mg/dL (ref 5–40)

## 2022-08-28 ENCOUNTER — Encounter: Payer: Self-pay | Admitting: Cardiovascular Disease

## 2022-08-29 ENCOUNTER — Ambulatory Visit (INDEPENDENT_AMBULATORY_CARE_PROVIDER_SITE_OTHER): Payer: Commercial Managed Care - PPO | Admitting: Neurology

## 2022-08-29 ENCOUNTER — Encounter: Payer: Self-pay | Admitting: Pharmacist

## 2022-08-29 ENCOUNTER — Encounter: Payer: Self-pay | Admitting: Neurology

## 2022-08-29 VITALS — BP 147/110 | HR 76

## 2022-08-29 DIAGNOSIS — G43709 Chronic migraine without aura, not intractable, without status migrainosus: Secondary | ICD-10-CM

## 2022-08-29 MED ORDER — ONABOTULINUMTOXINA 200 UNITS IJ SOLR
155.0000 [IU] | Freq: Once | INTRAMUSCULAR | Status: AC
  Administered 2022-08-29: 155 [IU] via INTRAMUSCULAR

## 2022-08-29 MED ORDER — REPATHA SURECLICK 140 MG/ML ~~LOC~~ SOAJ: 140.0000 mg | SUBCUTANEOUS | 3 refills | Status: DC

## 2022-08-29 MED ORDER — ONABOTULINUMTOXINA 200 UNITS IJ SOLR: 155.0000 [IU] | Freq: Once | INTRAMUSCULAR | 0 refills | Status: DC

## 2022-08-29 NOTE — Progress Notes (Signed)
   BOTOX PROCEDURE NOTE FOR MIGRAINE HEADACHE  HISTORY: Lauren Wolfe is here today for second Botox injection.  Hard to say the benefit of the first injection due to several external factors including continue, medication reaction, problems with elevated blood pressure and cholesterol.  She uses Nurtec as preventative, also helps with rescue.  Description of procedure:  The patient was placed in a sitting position. The standard protocol was used for Botox as follows, with 5 units of Botox injected at each site:   -Procerus muscle, midline injection  -Corrugator muscle, bilateral injection  -Frontalis muscle, bilateral injection, with 2 sites each side, medial injection was performed in the upper one third of the frontalis muscle, in the region vertical from the medial inferior edge of the superior orbital rim. The lateral injection was again in the upper one third of the forehead vertically above the lateral limbus of the cornea, 1.5 cm lateral to the medial injection site.  -Temporalis muscle injection, 4 sites, bilaterally. The first injection was 3 cm above the tragus of the ear, second injection site was 1.5 cm to 3 cm up from the first injection site in line with the tragus of the ear. The third injection site was 1.5-3 cm forward between the first 2 injection sites. The fourth injection site was 1.5 cm posterior to the second injection site.  -Occipitalis muscle injection, 3 sites, bilaterally. The first injection was done one half way between the occipital protuberance and the tip of the mastoid process behind the ear. The second injection site was done lateral and superior to the first, 1 fingerbreadth from the first injection. The third injection site was 1 fingerbreadth superiorly and medially from the first injection site.  -Cervical paraspinal muscle injection, 2 sites, bilateral, the first injection site was 1 cm from the midline of the cervical spine, 3 cm inferior to the  lower border of the occipital protuberance. The second injection site was 1.5 cm superiorly and laterally to the first injection site.  -Trapezius muscle injection was performed at 3 sites, bilaterally. The first injection site was in the upper trapezius muscle halfway between the inflection point of the neck, and the acromion. The second injection site was one half way between the acromion and the first injection site. The third injection was done between the first injection site and the inflection point of the neck.   A 200 unit bottle of Botox was used, 155 units were injected, the rest of the Botox was wasted. The patient tolerated the procedure well, there were no complications of the above procedure. She wore a hair piece that we navigated, next time will plan accordingly.   Botox NDC 6962-9528-41 Lot number [id number] Expiration date 12/2024 BB

## 2022-08-29 NOTE — Telephone Encounter (Signed)
This encounter was created in error - please disregard.

## 2022-08-29 NOTE — Addendum Note (Signed)
Addended by: Rosalee Kaufman on: 08/29/2022 12:52 PM   Modules accepted: Orders

## 2022-08-29 NOTE — Telephone Encounter (Deleted)
PA for Praluent requested Key: [id number] Repatha may be the preferred. Waiting on questions

## 2022-08-29 NOTE — Progress Notes (Signed)
Botox- 200 units x 1 vial Lot: [id number] Expiration: 12/2024 NDC: 7588-3254-98  Bacteriostatic 0.9% Sodium Chloride- 8mL total Lot: [id number] Expiration: 06/03/2023 NDC: 3094-0768-08  Dx: U11.031  B/B

## 2022-10-19 ENCOUNTER — Telehealth: Payer: Self-pay | Admitting: Neurology

## 2022-10-19 NOTE — Telephone Encounter (Signed)
Pt is on the schedule for botox injection for 11/21/22 and will need a new auth before appointment. Current PA expires 11/15/22

## 2022-11-06 NOTE — Telephone Encounter (Signed)
Checking for an update on PA, pt is scheduled for 11/21/22

## 2022-11-07 ENCOUNTER — Ambulatory Visit (INDEPENDENT_AMBULATORY_CARE_PROVIDER_SITE_OTHER): Payer: Commercial Managed Care - PPO | Admitting: Neurology

## 2022-11-07 ENCOUNTER — Encounter: Payer: Self-pay | Admitting: Neurology

## 2022-11-07 VITALS — Ht 66.0 in | Wt 199.0 lb

## 2022-11-07 DIAGNOSIS — G43709 Chronic migraine without aura, not intractable, without status migrainosus: Secondary | ICD-10-CM | POA: Diagnosis not present

## 2022-11-07 MED ORDER — ONDANSETRON 4 MG PO TBDP: ORAL_TABLET | ORAL | 8 refills | Status: DC

## 2022-11-07 MED ORDER — NURTEC 75 MG PO TBDP: 75.0000 mg | ORAL_TABLET | ORAL | 11 refills | Status: DC

## 2022-11-07 MED ORDER — TIZANIDINE HCL 4 MG PO TABS: 4.0000 mg | ORAL_TABLET | Freq: Three times a day (TID) | ORAL | 1 refills | Status: DC | PRN
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
## 2022-11-13 ENCOUNTER — Other Ambulatory Visit (HOSPITAL_COMMUNITY): Payer: Self-pay

## 2022-11-13 NOTE — Telephone Encounter (Signed)
   Benefit Verification [id number] Submitted!

## 2022-11-13 NOTE — Telephone Encounter (Signed)
Pharmacy Patient Advocate Encounter   Received notification from [facility] that prior authorization for Botox 200UNIT solution is required/requested.    PA submitted on 11/13/2022 to ([facility]) [employer] via [place] Status is pending

## 2022-11-14 ENCOUNTER — Other Ambulatory Visit (HOSPITAL_COMMUNITY): Payer: Self-pay

## 2022-11-14 ENCOUNTER — Other Ambulatory Visit: Payer: Self-pay

## 2022-11-14 MED ORDER — ONABOTULINUMTOXINA 200 UNITS IJ SOLR
INTRAMUSCULAR | 2 refills | Status: DC
  Filled 2022-11-14: qty 1, 90d supply, fill #0
  Filled 2023-02-13: qty 1, 90d supply, fill #1
  Filled 2023-05-15: qty 1, 90d supply, fill #2

## 2022-11-14 NOTE — Telephone Encounter (Signed)
Botox ordered and sent to [facility]

## 2022-11-14 NOTE — Addendum Note (Signed)
Addended by: Kristen Loader on: 11/14/2022 08:38 AM   Modules accepted: Orders

## 2022-11-14 NOTE — Telephone Encounter (Signed)
Can you please send rx to [facility]? Thank you!

## 2022-11-14 NOTE — Telephone Encounter (Signed)
Pharmacy Patient Advocate Encounter  Prior Authorization for Botox 200UNIT solution has been approved.    PA# PA Case ID: [id number]  Effective dates: 11/13/2022 through 11/13/2023  Per test billing copay is [phone] but when combined with the copay card it is a zero cost to the patient  This can be filled with [facility]

## 2022-11-21 ENCOUNTER — Ambulatory Visit: Payer: Medicare Other | Admitting: Neurology

## 2022-11-27 ENCOUNTER — Telehealth: Payer: Self-pay

## 2022-11-27 MED ORDER — WEGOVY 0.25 MG/0.5ML ~~LOC~~ SOAJ: 0.2500 mg | SUBCUTANEOUS | 11 refills | Status: DC

## 2022-11-27 NOTE — Telephone Encounter (Signed)
[facility]

## 2022-11-27 NOTE — Telephone Encounter (Signed)
[id number] PA was started for pt by her pharmacy, but there is no prescription in pt chart

## 2022-11-27 NOTE — Telephone Encounter (Signed)
Ok done to [facility]

## 2022-11-27 NOTE — Addendum Note (Signed)
Addended by: Biagio Borg on: 11/27/2022 09:23 PM   Modules accepted: Orders

## 2022-11-28 ENCOUNTER — Ambulatory Visit (INDEPENDENT_AMBULATORY_CARE_PROVIDER_SITE_OTHER): Payer: Commercial Managed Care - PPO | Admitting: Neurology

## 2022-11-28 ENCOUNTER — Encounter: Payer: Self-pay | Admitting: Neurology

## 2022-11-28 VITALS — BP 134/84 | HR 84 | Ht 66.0 in | Wt 199.0 lb

## 2022-11-28 DIAGNOSIS — G43709 Chronic migraine without aura, not intractable, without status migrainosus: Secondary | ICD-10-CM

## 2022-11-28 MED ORDER — ONABOTULINUMTOXINA 200 UNITS IJ SOLR
200.0000 [IU] | Freq: Once | INTRAMUSCULAR | Status: AC
  Administered 2022-11-28: 200 [IU] via INTRAMUSCULAR

## 2022-11-28 NOTE — Progress Notes (Signed)
Botox 200units x 1 vial  [id number] Lot-[id number]-03/2025 SP

## 2022-11-28 NOTE — Progress Notes (Signed)
   BOTOX PROCEDURE NOTE FOR MIGRAINE HEADACHE  HISTORY: Lauren Wolfe is here today for her third Botox injection. Her last was in 08/29/22 by me.  Has had good benefit.  Was having daily headache, now about 2 migraines a week.  Are less intense.  She is quite pleased.  Has done well with Botox.   Description of procedure:  The patient was placed in a sitting position. The standard protocol was used for Botox as follows, with 5 units of Botox injected at each site:   -Procerus muscle, midline injection  -Corrugator muscle, bilateral injection  -Frontalis muscle, bilateral injection, with 2 sites each side, medial injection was performed in the upper one third of the frontalis muscle, in the region vertical from the medial inferior edge of the superior orbital rim. The lateral injection was again in the upper one third of the forehead vertically above the lateral limbus of the cornea, 1.5 cm lateral to the medial injection site.  -Temporalis muscle injection, 4 sites, bilaterally. The first injection was 3 cm above the tragus of the ear, second injection site was 1.5 cm to 3 cm up from the first injection site in line with the tragus of the ear. The third injection site was 1.5-3 cm forward between the first 2 injection sites. The fourth injection site was 1.5 cm posterior to the second injection site.  -Occipitalis muscle injection, 3 sites, bilaterally. The first injection was done one half way between the occipital protuberance and the tip of the mastoid process behind the ear. The second injection site was done lateral and superior to the first, 1 fingerbreadth from the first injection. The third injection site was 1 fingerbreadth superiorly and medially from the first injection site.  -Cervical paraspinal muscle injection, 2 sites, bilateral, the first injection site was 1 cm from the midline of the cervical spine, 3 cm inferior to the lower border of the occipital protuberance. The  second injection site was 1.5 cm superiorly and laterally to the first injection site.  -Trapezius muscle injection was performed at 3 sites, bilaterally. The first injection site was in the upper trapezius muscle halfway between the inflection point of the neck, and the acromion. The second injection site was one half way between the acromion and the first injection site. The third injection was done between the first injection site and the inflection point of the neck.   A 200 unit bottle of Botox was used, 155 units were injected, the rest of the Botox was wasted. The patient tolerated the procedure well, there were no complications of the above procedure.  Botox NDC [id number] Lot number [id number] Expiration date 03/2025 SP

## 2022-11-29 ENCOUNTER — Telehealth: Payer: Self-pay

## 2022-11-29 NOTE — Telephone Encounter (Signed)
PA for [facility] is send, waiting for approval   Key: [id number]

## 2022-11-30 NOTE — Telephone Encounter (Signed)
Pharmacy Patient Advocate Encounter  Received notification from [facility] that the request for prior authorization for [facility] has been denied due to .    Please be advised we currently do not have a Pharmacist to review denials, therefore you will need to process appeals accordingly as needed. Thanks for your support at this time.   You may fax [phone], to appeal.

## 2022-12-07 ENCOUNTER — Encounter: Payer: Self-pay | Admitting: Internal Medicine

## 2022-12-07 ENCOUNTER — Other Ambulatory Visit (HOSPITAL_BASED_OUTPATIENT_CLINIC_OR_DEPARTMENT_OTHER): Payer: Self-pay

## 2022-12-07 MED ORDER — WEGOVY 0.25 MG/0.5ML ~~LOC~~ SOAJ
0.2500 mg | SUBCUTANEOUS | 11 refills | Status: DC
  Filled 2022-12-07: qty 2, 28d supply, fill #0

## 2022-12-11 ENCOUNTER — Other Ambulatory Visit (HOSPITAL_BASED_OUTPATIENT_CLINIC_OR_DEPARTMENT_OTHER): Payer: Self-pay

## 2022-12-12 ENCOUNTER — Other Ambulatory Visit (HOSPITAL_BASED_OUTPATIENT_CLINIC_OR_DEPARTMENT_OTHER): Payer: Self-pay

## 2022-12-12 NOTE — Telephone Encounter (Signed)
PA for [facility] denied. Pt is requesting a Astronomer. See her mycart [email or web address]

## 2022-12-12 NOTE — Telephone Encounter (Signed)
Sorry, no appeal letter needed as pt is not diabetic

## 2022-12-12 NOTE — Telephone Encounter (Signed)
Pt needing PA on [employer]. Submitted w/ (Key: [id number]). PA went to [facility]. Informing pt of status.Marland KitchenJohny Wolfe

## 2022-12-13 ENCOUNTER — Other Ambulatory Visit (HOSPITAL_BASED_OUTPATIENT_CLINIC_OR_DEPARTMENT_OTHER): Payer: Self-pay

## 2022-12-13 ENCOUNTER — Encounter (HOSPITAL_BASED_OUTPATIENT_CLINIC_OR_DEPARTMENT_OTHER): Payer: Self-pay

## 2022-12-25 ENCOUNTER — Ambulatory Visit (INDEPENDENT_AMBULATORY_CARE_PROVIDER_SITE_OTHER): Payer: Commercial Managed Care - PPO | Admitting: Internal Medicine

## 2022-12-25 ENCOUNTER — Encounter: Payer: Self-pay | Admitting: Internal Medicine

## 2022-12-25 ENCOUNTER — Other Ambulatory Visit (HOSPITAL_COMMUNITY): Payer: Self-pay

## 2022-12-25 VITALS — BP 128/76 | HR 88 | Temp 98.1°F | Ht 66.0 in | Wt 206.0 lb

## 2022-12-25 DIAGNOSIS — Z0001 Encounter for general adult medical examination with abnormal findings: Secondary | ICD-10-CM | POA: Diagnosis not present

## 2022-12-25 DIAGNOSIS — E7800 Pure hypercholesterolemia, unspecified: Secondary | ICD-10-CM | POA: Diagnosis not present

## 2022-12-25 DIAGNOSIS — R739 Hyperglycemia, unspecified: Secondary | ICD-10-CM | POA: Diagnosis not present

## 2022-12-25 DIAGNOSIS — E669 Obesity, unspecified: Secondary | ICD-10-CM | POA: Diagnosis not present

## 2022-12-25 DIAGNOSIS — Z0289 Encounter for other administrative examinations: Secondary | ICD-10-CM

## 2022-12-25 DIAGNOSIS — E559 Vitamin D deficiency, unspecified: Secondary | ICD-10-CM | POA: Diagnosis not present

## 2022-12-25 DIAGNOSIS — I10 Essential (primary) hypertension: Secondary | ICD-10-CM

## 2022-12-25 DIAGNOSIS — E538 Deficiency of other specified B group vitamins: Secondary | ICD-10-CM

## 2022-12-25 DIAGNOSIS — R87619 Unspecified abnormal cytological findings in specimens from cervix uteri: Secondary | ICD-10-CM | POA: Insufficient documentation

## 2022-12-25 MED ORDER — ZEPBOUND 2.5 MG/0.5ML ~~LOC~~ SOAJ
2.5000 mg | SUBCUTANEOUS | 11 refills | Status: DC
  Filled 2022-12-25: qty 2, 28d supply, fill #0

## 2022-12-25 NOTE — Patient Instructions (Addendum)
Please take all new medication as prescribed - the zepbound for wt loss at the [facility]  Please continue all other medications as before, and refills have been done if requested.  Please have the pharmacy call with any other refills you may need.  Please continue your efforts at being more active, low cholesterol diet, and weight control.  You are otherwise up to date with prevention measures today.  Please keep your appointments with your specialists as you may have planned  Please go to the LAB at the blood drawing area for the tests to be done  You will be contacted by phone if any changes need to be made immediately.  Otherwise, you will receive a letter about your results with an explanation, but please check with MyChart first.  Please remember to sign up for MyChart if you have not done so, as this will be important to you in the future with finding out test results, communicating by private email, and scheduling acute appointments online when needed.  Please make an Appointment to return for your 1 year visit, or sooner if needed  OK to cancel the May 2024 appt

## 2022-12-25 NOTE — Progress Notes (Signed)
Patient ID: Lauren Wolfe, female   DOB: 1975-11-27, 47 y.o.   MRN: [id number]         Chief Complaint:: wellness exam and obesity for GLP1 therapy, htn, hld, low vit d       HPI:  Lauren Wolfe is a 47 y.o. female here for wellness exam; decliens covid booster, o/w up to date                        Also botox help the migraine but tends to wear off a bit soon.  Working 1.5 jobs.  Pt denies chest pain, increased sob or doe, wheezing, orthopnea, PND, increased LE swelling, palpitations, dizziness or syncope.   Pt denies polydipsia, polyuria, or new focal neuro s/s.    Pt denies fever, wt loss, night sweats, loss of appetite, or other constitutional symptoms  Asks for GLP1 tx for wt loss, needs documented BMI to qualify for insurance purpose.  Wt has recently increased despite diet and increased activity   Wt Readings from Last 3 Encounters:  12/25/22 206 lb (93.4 kg)  11/28/22 199 lb (90.3 kg)  11/07/22 199 lb (90.3 kg)   BP Readings from Last 3 Encounters:  12/25/22 128/76  11/28/22 134/84  08/29/22 (!) 147/110   Immunization History  Administered Date(s) Administered   Influenza Whole 07/21/2010   Influenza,inj,Quad PF,6+ Mos 08/21/2018   Influenza-Unspecified 07/05/2017, 08/19/2018   PFIZER(Purple Top)SARS-COV-2 Vaccination 12/10/2019, 01/07/2020, 09/24/2020   Td 07/21/2010   Tdap 01/30/2017   Unspecified SARS-COV-2 Vaccination 06/30/2020   There are no preventive care reminders to display for this patient.     Past Medical History:  Diagnosis Date   Anxiety    Arm vein blood clot, right    Knee pain, left 01/2019   Migraine    Wears glasses    and contacts   Past Surgical History:  Procedure Laterality Date   BREAST BIOPSY  10/31/2012   Procedure: BREAST BIOPSY WITH NEEDLE LOCALIZATION;  Surgeon: Rolm Bookbinder, MD;  Location: [facility];  Service: General;  Laterality: Right;  right breast wire guided excisional biopsy   BREAST EXCISIONAL  BIOPSY Right 2014   ganglion cysts  2011   WRIST SURGERY Left     reports that she has never smoked. She has never used smokeless tobacco. She reports that she does not drink alcohol and does not use drugs. family history includes Aneurysm in her mother; Breast cancer in her cousin, maternal grandmother, and another family member; Diabetes in an other family member; Gout in an other family member; Hypertension in her mother and another family member; Migraines in her paternal aunt; Other in an other family member; Stomach cancer in her father. Allergies  Allergen Reactions   Azithromycin Shortness Of Breath, Rash and Other (See Comments)    Prurutis   Celebrex [Celecoxib] Hives, Itching and Swelling    Swelling of hands and feet   Codeine Shortness Of Breath, Itching and Rash   Hydrocodone Shortness Of Breath, Itching and Rash   Ketorolac Tromethamine Shortness Of Breath, Itching and Rash   Prednisone Shortness Of Breath   Topiramate Shortness Of Breath   Lipitor [Atorvastatin] Other (See Comments)    MYALGIAS   Rosuvastatin Other (See Comments)    Body aches   Tramadol Itching   Current Outpatient Medications on File Prior to Visit  Medication Sig Dispense Refill   botulinum toxin Type A (BOTOX) 200 units injection Inject into head,  neck and shoulder every three months 1 each 2   Evolocumab (REPATHA SURECLICK) [id number] MG/ML SOAJ Inject 140 mg into the skin every 14 (fourteen) days. 6 mL 3   levonorgestrel (MIRENA) 20 MCG/24HR IUD 1 each by Intrauterine route once.     ondansetron (ZOFRAN-ODT) 4 MG disintegrating tablet TAKE 1 TABLET BY MOUTH EVERY 8 HOURS AS NEEDED FOR NAUSEA AND VOMITING 9 tablet 8   Rimegepant Sulfate (NURTEC) 75 MG TBDP Take 1 tablet (75 mg total) by mouth every other day. 16 tablet 11   Semaglutide-Weight Management (WEGOVY) 0.25 MG/0.5ML SOAJ Inject 0.25 mg into the skin once a week. 2 mL 11   SUMAtriptan 6 MG/0.5ML SOAJ Take 1 inj at onset of migraine.  May repeat  in 2 hrs, if needed.  Max dose: 2 inj/day. This is a 30 day prescription. 6 mL 5   tiZANidine (ZANAFLEX) 4 MG tablet Take 1 tablet (4 mg total) by mouth every 8 (eight) hours as needed (For migraines.). 30 tablet 1   No current facility-administered medications on file prior to visit.        ROS:  All others reviewed and negative.  Objective        PE:  BP 128/76   Pulse 88   Temp 98.1 F (36.7 C) (Oral)   Ht 5\' 6"  (1.676 m)   Wt 206 lb (93.4 kg)   SpO2 96%   BMI 33.25 kg/m                 Constitutional: Pt appears in NAD               HENT: Head: NCAT.                Right Ear: External ear normal.                 Left Ear: External ear normal.                Eyes: . Pupils are equal, round, and reactive to light. Conjunctivae and EOM are normal               Nose: without d/c or deformity               Neck: Neck supple. Gross normal ROM               Cardiovascular: Normal rate and regular rhythm.                 Pulmonary/Chest: Effort normal and breath sounds without rales or wheezing.                Abd:  Soft, NT, ND, + BS, no organomegaly               Neurological: Pt is alert. At baseline orientation, motor grossly intact               Skin: Skin is warm. No rashes, no other new lesions, LE edema - none               Psychiatric: Pt behavior is normal without agitation   Micro: none  Cardiac tracings I have personally interpreted today:  none  Pertinent Radiological findings (summarize): none   Lab Results  Component Value Date   WBC 6.6 12/26/2022   HGB 14.8 12/26/2022   HCT 44.0 12/26/2022   PLT 249.0 12/26/2022   GLUCOSE 84 12/26/2022   CHOL 191 12/26/2022   TRIG 79.0  12/26/2022   HDL 71.40 12/26/2022   LDLDIRECT 164.5 09/19/2012   LDLCALC 104 (H) 12/26/2022   ALT 12 12/26/2022   AST 14 12/26/2022   NA 136 12/26/2022   K 4.2 12/26/2022   CL 102 12/26/2022   CREATININE 0.93 12/26/2022   BUN 12 12/26/2022   CO2 27 12/26/2022   TSH 1.33 12/26/2022    INR 1.0 11/11/2019   HGBA1C 5.7 12/26/2022   MICROALBUR 0.7 12/26/2022   Assessment/Plan:  Lauren Wolfe is a 47 y.o. Black or African American [2] female with  has a past medical history of Anxiety, Arm vein blood clot, right, Knee pain, left (01/2019), Migraine, and Wears glasses.  Encounter for well adult exam with abnormal findings Age and sex appropriate education and counseling updated with regular exercise and diet Referrals for preventative services - none needed Immunizations addressed - declines covid booster Smoking counseling  - none needed Evidence for depression or other mood disorder - none significant Most recent labs reviewed. I have personally reviewed and have noted: 1) the patient's medical and social history 2) The patient's current medications and supplements 3) The patient's height, weight, and BMI have been recorded in the chart   Essential hypertension BP Readings from Last 3 Encounters:  12/25/22 128/76  11/28/22 134/84  08/29/22 (!) 147/110   Stable, pt to continue medical treatment  - diet, wt control   HLD (hyperlipidemia) Lab Results  Component Value Date   LDLCALC 104 (H) 12/26/2022   Uncontrolled,, pt to continue current repatha 140 mg, declines statin   Obesity (BMI 30-39.9) Worsening persistent, for zepbound 2.5 mg weekly  Vitamin D deficiency Last vitamin D Lab Results  Component Value Date   VD25OH 14.37 (L) 12/26/2022   Low, to start oral replacement  Followup: Return in about 1 year (around 12/25/2023).  Cathlean Cower, MD 12/26/2022 8:32 PM [facility]

## 2022-12-26 ENCOUNTER — Encounter: Payer: Self-pay | Admitting: Internal Medicine

## 2022-12-26 LAB — BASIC METABOLIC PANEL
BUN: 12 mg/dL (ref 6–23)
CO2: 27 mEq/L (ref 19–32)
Calcium: 9.6 mg/dL (ref 8.4–10.5)
Chloride: 102 mEq/L (ref 96–112)
Creatinine, Ser: 0.93 mg/dL (ref 0.40–1.20)
GFR: 73.69 mL/min (ref >60.00)
Glucose, Bld: 84 mg/dL (ref 70–99)
Potassium: 4.2 mEq/L (ref 3.5–5.1)
Sodium: 136 mEq/L (ref 135–145)

## 2022-12-26 LAB — URINALYSIS, ROUTINE W REFLEX MICROSCOPIC
Bilirubin Urine: NEGATIVE
Ketones, ur: NEGATIVE
Leukocytes,Ua: NEGATIVE
Nitrite: NEGATIVE
Specific Gravity, Urine: 1.025 (ref 1.000–1.030)
Total Protein, Urine: NEGATIVE
Urine Glucose: NEGATIVE
Urobilinogen, UA: 0.2 (ref 0.0–1.0)
pH: 6 (ref 5.0–8.0)

## 2022-12-26 LAB — CBC WITH DIFFERENTIAL/PLATELET
Basophils Absolute: 0 10*3/uL (ref 0.0–0.1)
Basophils Relative: 0.6 % (ref 0.0–3.0)
Eosinophils Absolute: 0.1 10*3/uL (ref 0.0–0.7)
Eosinophils Relative: 1.2 % (ref 0.0–5.0)
HCT: 44 % (ref 36.0–46.0)
Hemoglobin: 14.8 g/dL (ref 12.0–15.0)
Lymphocytes Relative: 31.5 % (ref 12.0–46.0)
Lymphs Abs: 2.1 10*3/uL (ref 0.7–4.0)
MCHC: 33.7 g/dL (ref 30.0–36.0)
MCV: 90.8 fl (ref 78.0–100.0)
Monocytes Absolute: 0.6 10*3/uL (ref 0.1–1.0)
Monocytes Relative: 8.6 % (ref 3.0–12.0)
Neutro Abs: 3.9 10*3/uL (ref 1.4–7.7)
Neutrophils Relative %: 58.1 % (ref 43.0–77.0)
Platelets: 249 10*3/uL (ref 150.0–400.0)
RBC: 4.85 Mil/uL (ref 3.87–5.11)
RDW: 13.3 % (ref 11.5–15.5)
WBC: 6.6 10*3/uL (ref 4.0–10.5)

## 2022-12-26 LAB — VITAMIN B12: Vitamin B-12: 979 pg/mL — ABNORMAL HIGH (ref 211–911)

## 2022-12-26 LAB — HEPATIC FUNCTION PANEL
ALT: 12 U/L (ref 0–35)
AST: 14 U/L (ref 0–37)
Albumin: 4.5 g/dL (ref 3.5–5.2)
Alkaline Phosphatase: 66 U/L (ref 39–117)
Bilirubin, Direct: 0.1 mg/dL (ref 0.0–0.3)
Total Bilirubin: 0.5 mg/dL (ref 0.2–1.2)
Total Protein: 7.5 g/dL (ref 6.0–8.3)

## 2022-12-26 LAB — LIPID PANEL
Cholesterol: 191 mg/dL (ref 0–200)
HDL: 71.4 mg/dL (ref >39.00)
LDL Cholesterol: 104 mg/dL — ABNORMAL HIGH (ref 0–99)
NonHDL: 119.49
Total CHOL/HDL Ratio: 3
Triglycerides: 79 mg/dL (ref 0.0–149.0)
VLDL: 15.8 mg/dL (ref 0.0–40.0)

## 2022-12-26 LAB — MICROALBUMIN / CREATININE URINE RATIO
Creatinine,U: 139.6 mg/dL
Microalb Creat Ratio: 0.5 mg/g (ref 0.0–30.0)
Microalb, Ur: 0.7 mg/dL (ref 0.0–1.9)

## 2022-12-26 LAB — TSH: TSH: 1.33 u[IU]/mL (ref 0.35–5.50)

## 2022-12-26 LAB — HEMOGLOBIN A1C: Hgb A1c MFr Bld: 5.7 % (ref 4.6–6.5)

## 2022-12-26 LAB — VITAMIN D 25 HYDROXY (VIT D DEFICIENCY, FRACTURES): VITD: 14.37 ng/mL — ABNORMAL LOW (ref 30.00–100.00)

## 2022-12-26 NOTE — Assessment & Plan Note (Signed)
Lab Results  Component Value Date   LDLCALC 104 (H) 12/26/2022   Uncontrolled,, pt to continue current repatha 140 mg, declines statin

## 2022-12-26 NOTE — Assessment & Plan Note (Signed)

## 2022-12-26 NOTE — Assessment & Plan Note (Signed)
Worsening persistent, for zepbound 2.5 mg weekly

## 2022-12-26 NOTE — Assessment & Plan Note (Signed)
Last vitamin D Lab Results  Component Value Date   VD25OH 14.37 (L) 12/26/2022   Low, to start oral replacement

## 2022-12-26 NOTE — Assessment & Plan Note (Signed)
BP Readings from Last 3 Encounters:  12/25/22 128/76  11/28/22 134/84  08/29/22 (!) 147/110   Stable, pt to continue medical treatment  - diet, wt control

## 2022-12-27 ENCOUNTER — Encounter (INDEPENDENT_AMBULATORY_CARE_PROVIDER_SITE_OTHER): Payer: Commercial Managed Care - PPO | Admitting: Family Medicine

## 2022-12-28 ENCOUNTER — Other Ambulatory Visit (HOSPITAL_COMMUNITY): Payer: Self-pay

## 2022-12-29 ENCOUNTER — Other Ambulatory Visit (HOSPITAL_COMMUNITY): Payer: Self-pay

## 2023-01-01 ENCOUNTER — Encounter: Payer: Self-pay | Admitting: Internal Medicine

## 2023-01-01 ENCOUNTER — Other Ambulatory Visit (HOSPITAL_COMMUNITY): Payer: Self-pay

## 2023-01-01 ENCOUNTER — Telehealth: Payer: Self-pay

## 2023-01-01 NOTE — Telephone Encounter (Signed)
Pharmacy Patient Advocate Encounter   Received notification from [facility] that prior authorization for Zepbound 2.5MG /0.5ML pen-injectors is required/requested.  Per Test Claim: PA Required   PA submitted on 01/01/23 to (ins) [employer] via CoverMyMeds Key or (Medicaid) confirmation # [id number] Status is pending

## 2023-01-02 ENCOUNTER — Other Ambulatory Visit (HOSPITAL_COMMUNITY): Payer: Self-pay

## 2023-01-02 NOTE — Telephone Encounter (Signed)
Patient Advocate Encounter  Prior Authorization for Zepbound 2.5MG /0.5ML pen-injectors has been approved.    PA# [id number] Effective dates: 01/01/23 through 09/02/23

## 2023-01-16 ENCOUNTER — Encounter: Payer: Self-pay | Admitting: Internal Medicine

## 2023-01-16 ENCOUNTER — Encounter (INDEPENDENT_AMBULATORY_CARE_PROVIDER_SITE_OTHER): Payer: Self-pay | Admitting: Family Medicine

## 2023-01-16 ENCOUNTER — Ambulatory Visit (INDEPENDENT_AMBULATORY_CARE_PROVIDER_SITE_OTHER): Payer: Commercial Managed Care - PPO | Admitting: Family Medicine

## 2023-01-16 VITALS — BP 132/97 | HR 80 | Temp 98.2°F | Ht 66.0 in | Wt 197.6 lb

## 2023-01-16 DIAGNOSIS — F5089 Other specified eating disorder: Secondary | ICD-10-CM

## 2023-01-16 DIAGNOSIS — R5383 Other fatigue: Secondary | ICD-10-CM | POA: Diagnosis not present

## 2023-01-16 DIAGNOSIS — Z1331 Encounter for screening for depression: Secondary | ICD-10-CM

## 2023-01-16 DIAGNOSIS — E559 Vitamin D deficiency, unspecified: Secondary | ICD-10-CM | POA: Diagnosis not present

## 2023-01-16 DIAGNOSIS — G43911 Migraine, unspecified, intractable, with status migrainosus: Secondary | ICD-10-CM | POA: Diagnosis not present

## 2023-01-16 DIAGNOSIS — R0602 Shortness of breath: Secondary | ICD-10-CM | POA: Insufficient documentation

## 2023-01-16 DIAGNOSIS — E669 Obesity, unspecified: Secondary | ICD-10-CM | POA: Insufficient documentation

## 2023-01-16 DIAGNOSIS — Z6831 Body mass index (BMI) 31.0-31.9, adult: Secondary | ICD-10-CM

## 2023-01-16 DIAGNOSIS — E7849 Other hyperlipidemia: Secondary | ICD-10-CM

## 2023-01-16 DIAGNOSIS — F509 Eating disorder, unspecified: Secondary | ICD-10-CM | POA: Insufficient documentation

## 2023-01-16 NOTE — Progress Notes (Signed)
Lauren Wolfe, D.O.  ABFM, ABOM Specializing in Clinical Bariatric Medicine Office located at: [address] [place], [place]  [address]     Initial Bariatric Medicine Consultation Visit  Dear Lauren Levins, MD   Thank you for referring Lauren Wolfe to our clinic today for evaluation.  We performed a consultation to discuss her options for treatment and educate the patient on her disease state.  The following note includes my evaluation and treatment recommendations.   Please do not hesitate to reach out to me directly if you have any further concerns.    Assessment and Plan:   Orders Placed This Encounter  Procedures   Insulin, random   T4, free   EKG 12-Lead    There are no discontinued medications.   No orders of the defined types were placed in this encounter.    1) Fatigue Assessment: Condition is Not optimized.  Lauren Wolfe does feel that her weight is causing her energy to be lower than it should be. Fatigue may be related to obesity, depression or many other causes. she does not appear to have any red flag symptoms and this appears to most likely related to her current lifestyle habits and dietary intake.  Plan: Check labs Labs will be ordered and reviewed with her at their next office visit in two weeks. Epworth sleepiness scale score appears to be within normal limits.  Her ESS score is 5.   Lauren Wolfe has slight symptoms of daytime somnolence and denies waking up still tired. Patient has a history of symptoms of morning headache. Lauren Wolfe generally gets 5 or 6 hours of sleep per night, and states that she has generally restful sleep. Snoring is sometimes present. Apneic episodes is not present.  ECG: Normal sinus rhythm, rate 78 bpm; reassuring without any acute abnormalities, will continue to monitor for symptoms  Modified PHQ-9 Depression Screen: Her Food and Mood (modified PHQ-9) score was 2. In the meanwhile, Lauren Wolfe will focus on self care  including making healthy food choices by following their meal plan, improving sleep quality and focusing on stress reduction.  Once we are assured she is on an appropriate meal plan, we will start discussing exercise to increase cardiovascular fitness levels.    2) Shortness of breath on exertion Assessment: Condition is Not optimized. Lauren Wolfe does feel that she gets out of breath more easily than she used to when she exercises and seems to be worsening over time with weight gain. Lauren Wolfe denies shortness of breath at rest or orthopnea. Lauren Wolfe's shortness of breath appears to be obesity related and exercise induced, as they do not appear to have any "red flag" symptoms/ concerns today.   Plan:  Obtain labs today and will be reviewed with her at their next office visit in two weeks. Indirect Calorimeter completed today to help guide our dietary regimen. It shows a VO2 of 229 and a REE of 1584.  Her calculated basal metabolic rate is 6578 thus her measured basal metabolic rate is worse than expected. Patient agreed to work on weight loss at this time.  As Lauren Wolfe progresses through our weight loss program, we will gradually increase exercise as tolerated to treat her current condition.   If Lauren Wolfe follows our recommendations and loses 5-10% of their weight without improvement of her shortness of breath or if at any time, symptoms become more concerning, they agree to urgently follow up with their PCP/ specialist for further consideration/ evaluation.   Lauren Wolfe verbalizes agreement with  this plan.    Other hyperlipidemia Assessment: Condition is Not optimized. Lab Results  Component Value Date   CHOL 191 12/26/2022   HDL 71.40 12/26/2022   LDLCALC 104 (H) 12/26/2022   LDLDIRECT 164.5 09/19/2012   TRIG 79.0 12/26/2022   CHOLHDL 3 12/26/2022  She has been compliant with Evolocumab 140 mg every 14 days. Denies any side effects.   Plan: Continue with med as recommended by  pcp/specialist.  - I stressed the importance that patient start with our prudent nutritional plan that is low in saturated and trans fats, and low in fatty carbs to improve these numbers.  - We will continue routine screening as patient continues to achieve health goals along their weight loss journey     Vitamin D deficiency Assessment: Condition is Not optimized. Lab Results  Component Value Date   VD25OH 14.37 (L) 12/26/2022   VD25OH 18.32 (L) 02/24/2022   VD25OH 27.23 (L) 12/22/2020  Patient endorses that she is not taking any medication for this condition. She was previously on an Vitamin D medication but stopped because it caused her constipation.   Plan: - weight loss will likely improve availability of vitamin D, thus encouraged Lauren Wolfe to begin with meal plan and their weight loss efforts to further improve this condition.  Thus, we will need to monitor levels regularly (every 3-4 mo on average) to keep levels within normal limits and prevent over supplementation.   Intractable migraine with status migrainosus, unspecified migraine type Assessment: Condition is not optimized. She endorses that the weather changes has worsened her migraines. Patient is compliant with Nurtec 75 mg every other day, Sumatriptan, Botax injections 200 units every 3 months, Zanaflex 4 mg every 8 hours as needed, and Zofran prn as recommended by pcp and pcp/specialist.   Plan: Check labs. Continue with meds and treatment as recommended by pcp/specialist.   Other disorder of eating-Emotional Eating Assessment: Condition is stable.  Denies any SI/HI. Mood is stable. Her hunger is controlled, but craves breads and sweets. She endorses that she eats when stressed.   Plan: Check labs. Patient was referred to Dr. Dewaine Conger, our Bariatric Psychologist, for counseling on emotional eating.  We discussed the importance of not skipping meals and getting all her proteins and fiber in on a daily basis discussed.    Start  her prudent nutritional plan that is low in simple carbohydrates, saturated fats and trans fats to goal of 5-10% weight loss to achieve significant health benefits.     TREATMENT PLAN FOR OBESITY: Class 1 obesity with serious comorbidity and body mass index (BMI) of 31.0 to 31.9 in adult, unspecified obesity type. Assessment: Condition is Not optimized. Biometric data collected today, was reviewed with patient.  Fat mass is 75 lbs.  Muscle mass is 116.4 lbs. Total body water is 78.8 lbs. Patient started  Zepbound 2.5 mg weekly via PCP 2 weeks ago. She is not taking the Wegovy 0.25 mg weekly.   Plan:  Check labs.  -Continue with Zepbound. As her weight loss physicians, we will begin management of her Zepbound.  -Barrie is currently in the action stage of change. As such, her goal is to continue weight management plan. -Lauren Wolfe will work on healthier eating habits and try their best to begin the Pescatarian meal plan the best they can.   Behavioral Intervention Additional resources provided today: Pescatarian meal plan handout  Evidence-based interventions for health behavior change were utilized today including the discussion of self monitoring techniques, problem-solving barriers and  SMART goal setting techniques.   Regarding patient's less desirable eating habits and patterns, we employed the technique of small changes.  Pt will specifically work on: following the Pescatarian meal plan to the best of her ability for next visit.    Recommended Physical Activity Goals Lauren Wolfe has been advised to work up to 150 minutes of moderate intensity aerobic activity a week and strengthening exercises 2-3 times per week for cardiovascular health, weight loss maintenance and preservation of muscle mass.  She has agreed to continue their current level of activity  FOLLOW UP: Follow up in 2 weeks. She was informed of the importance of frequent follow up visits to maximize her success with  intensive lifestyle modifications for her multiple health conditions.  Lauren Wolfe is aware that we will review all of her lab results at our next visit.  She is aware that if anything is critical/ life threatening with the results, we will be contacting her via MyChart prior to the office visit to discuss management.    Chief Complaint:   OBESITY Lauren Wolfe (MR# [id number]) is a 47 y.o. female who presents for evaluation and treatment of obesity and related comorbidities. Current BMI is Body mass index is 31.89 kg/m. Sharilynn has been struggling with her weight for many years and has been unsuccessful in either losing weight, maintaining weight loss, or reaching her healthy weight goal.  Lauren Wolfe is currently in the action stage of change and ready to dedicate time achieving and maintaining a healthier weight. Chade is interested in becoming our patient and working on intensive lifestyle modifications including (but not limited to) diet and exercise for weight loss.  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 Patient is single and has a daughter. She lives by herself.   She desires to be 180 lbs and her heaviest weight was 228 lbs. In the past, she has tried B12 shots and phentermine for weight loss and was down to 175 lbs.   Patient does not do regular exercise.   Patient eats out 3-4 times a week. She enjoys cooking, but endorses that she does not have time too cook. She does not skip any meals.   She craves breads and sweets and snacks on candy. Her worst habit is eating sweets.   She does not like read meat, chicken, or pork. She eats typically fish and seafood for proteins.   Patient does not drink regular soda, but drinks caloric beverages at [facility] Coffee, sweet teas with sugar, and lactose free milk.   Labs were done with PCP on  12/26/22 and will be reviewed at next OV.   Subjective:   This is the  patient's first visit at [facility].  The patient's NEW PATIENT PACKET that they filled out prior to today's office visit was reviewed at length and information from that paperwork was included within the following office visit note.    Included in the packet: current and past health history, medications, allergies, ROS, gynecologic history (women only), surgical history, family history, social history, weight history, weight loss surgery history (for those that have had weight loss surgery), nutritional evaluation, mood and food questionnaire along with a depression screening (PHQ9) on all patients, an Epworth questionnaire, sleep habits questionnaire, patient life and health improvement goals questionnaire. These will all be scanned into the patient's chart under the "media" tab.   Review of Systems: Please refer to new patient packet scanned  into media. Pertinent positives were addressed with patient today.   Objective:   PHYSICAL EXAM: Blood pressure (!) 132/97, pulse 80, temperature 98.2 F (36.8 C), height 5\' 6"  (1.676 m), weight 197 lb 9.6 oz (89.6 kg), SpO2 100 %. Body mass index is 31.89 kg/m. General: Well Developed, well nourished, and in no acute distress.  HEENT: Normocephalic, atraumatic Skin: Warm and dry, cap RF less 2 sec, good turgor Chest:  Normal excursion, shape, no gross abn Respiratory: speaking in full sentences, no conversational dyspnea NeuroM-Sk: Ambulates w/o assistance, moves * 4 Psych: A and O *3, insight good, mood-full  Anthropometric Measurements Height: 5\' 6"  (1.676 m) Weight: 197 lb 9.6 oz (89.6 kg) BMI (Calculated): 31.91 Starting Weight: 197lb Peak Weight: 225lb Waist Measurement : 36 inches   Body Composition  Body Fat %: 38 % Fat Mass (lbs): 75 lbs Muscle Mass (lbs): 116.4 lbs Total Body Water (lbs): 78.8 lbs Visceral Fat Rating : 9   Other Clinical Data RMR: [id number] Fasting: yes Labs: yes Today's Visit #: 1 Starting  Date: 01/16/23    DIAGNOSTIC DATA REVIEWED:  BMET    Component Value Date/Time   NA 136 12/26/2022 0807   NA 137 07/30/2020 1051   K 4.2 12/26/2022 0807   CL 102 12/26/2022 0807   CO2 27 12/26/2022 0807   GLUCOSE 84 12/26/2022 0807   BUN 12 12/26/2022 0807   BUN 10 07/30/2020 1051   CREATININE 0.93 12/26/2022 0807   CALCIUM 9.6 12/26/2022 0807   GFRNONAA 67 07/30/2020 1051   GFRAA 77 07/30/2020 1051   Lab Results  Component Value Date   HGBA1C 5.7 12/26/2022   HGBA1C 5.3 02/24/2022   No results found for: "INSULIN" Lab Results  Component Value Date   TSH 1.33 12/26/2022   CBC    Component Value Date/Time   WBC 6.6 12/26/2022 0807   RBC 4.85 12/26/2022 0807   HGB 14.8 12/26/2022 0807   HCT 44.0 12/26/2022 0807   PLT 249.0 12/26/2022 0807   MCV 90.8 12/26/2022 0807   MCH 29.8 11/11/2019 2237   MCHC 33.7 12/26/2022 0807   RDW 13.3 12/26/2022 0807   Iron Studies    Component Value Date/Time   IRON 129 12/17/2019 0926   FERRITIN 348 (H) 10/22/2019 0222   IRONPCTSAT 43.1 12/17/2019 0926   Lipid Panel     Component Value Date/Time   CHOL 191 12/26/2022 0807   CHOL 305 (H) 08/21/2022 0958   TRIG 79.0 12/26/2022 0807   HDL 71.40 12/26/2022 0807   HDL 92 08/21/2022 0958   CHOLHDL 3 12/26/2022 0807   VLDL 15.8 12/26/2022 0807   LDLCALC 104 (H) 12/26/2022 0807   LDLCALC 200 (H) 08/21/2022 0958   LDLDIRECT 164.5 09/19/2012 1052   Hepatic Function Panel     Component Value Date/Time   PROT 7.5 12/26/2022 0807   PROT 6.9 11/02/2021 0847   ALBUMIN 4.5 12/26/2022 0807   ALBUMIN 4.4 11/02/2021 0847   AST 14 12/26/2022 0807   ALT 12 12/26/2022 0807   ALKPHOS 66 12/26/2022 0807   BILITOT 0.5 12/26/2022 0807   BILITOT 0.2 11/02/2021 0847   BILIDIR 0.1 12/26/2022 0807   BILIDIR <0.10 11/02/2021 0847      Component Value Date/Time   TSH 1.33 12/26/2022 0807   Nutritional Lab Results  Component Value Date   VD25OH 14.37 (L) 12/26/2022   VD25OH 18.32  (L) 02/24/2022   VD25OH 27.23 (L) 12/22/2020    Attestation Statements:   Reviewed by  clinician on day of visit: allergies, medications, problem list, medical history, surgical history, family history, social history, and previous encounter notes.  During the visit, I independently reviewed the patient's EKG, bioimpedance scale results, and indirect calorimeter results. I used this information to tailor a meal plan for the patient that will help Annaelle S Dhami to lose weight and will improve her obesity-related conditions going forward.  I performed a medically necessary appropriate examination and/or evaluation. I discussed the assessment and treatment plan with the patient. The patient was provided an opportunity to ask questions and all were answered. The patient agreed with the plan and demonstrated an understanding of the instructions. Labs were ordered today (unless patient declined them) and will be reviewed with the patient at our next visit unless more critical results need to be addressed immediately. Clinical information was updated and documented in the EMR.  Time spent on visit including pre-visit chart review and post-visit care was estimated to be 60 minutes.   I,Special Puri,acting as a Neurosurgeon for [employer], DO.,have documented all relevant documentation on the behalf of Thomasene Lot, DO,as directed by  Thomasene Lot, DO while in the presence of Thomasene Lot, DO.   I, Thomasene Lot, DO, have reviewed all documentation for this visit. The documentation on 01/16/23 for the exam, diagnosis, procedures, and orders are all accurate and complete.

## 2023-01-17 LAB — INSULIN, RANDOM: INSULIN: 10.5 u[IU]/mL (ref 2.6–24.9)

## 2023-01-17 LAB — T4, FREE: Free T4: 1.19 ng/dL (ref 0.82–1.77)

## 2023-01-17 MED ORDER — ZEPBOUND 5 MG/0.5ML ~~LOC~~ SOAJ
5.0000 mg | SUBCUTANEOUS | 11 refills | Status: DC
  Filled 2023-01-18: qty 2, 28d supply, fill #0
  Filled 2023-02-15: qty 2, 28d supply, fill #1
  Filled 2023-03-15: qty 2, 28d supply, fill #2
  Filled 2023-04-13: qty 2, 28d supply, fill #3
  Filled 2023-05-08: qty 2, 28d supply, fill #4
  Filled 2023-06-01: qty 2, 28d supply, fill #5
  Filled 2023-06-27: qty 2, 28d supply, fill #6
  Filled 2023-07-26: qty 2, 28d supply, fill #7
  Filled 2023-08-23: qty 2, 28d supply, fill #8
  Filled 2023-09-16: qty 2, 28d supply, fill #9
  Filled 2023-10-14: qty 2, 28d supply, fill #10
  Filled 2023-11-14: qty 2, 28d supply, fill #11
  Filled 2023-12-17: qty 2, 28d supply, fill #12
  Filled ????-??-??: fill #4
  Filled ????-??-??: fill #5
  Filled ????-??-??: fill #13

## 2023-01-18 ENCOUNTER — Other Ambulatory Visit (HOSPITAL_COMMUNITY): Payer: Self-pay

## 2023-01-18 ENCOUNTER — Encounter: Payer: Self-pay | Admitting: Internal Medicine

## 2023-01-18 NOTE — Addendum Note (Signed)
Addended by: [name] on: 01/18/2023 02:10 PM   Modules accepted: Level of Service

## 2023-01-25 ENCOUNTER — Telehealth: Payer: Self-pay | Admitting: Cardiovascular Disease

## 2023-01-25 NOTE — Telephone Encounter (Signed)
Repatha does not affect blood pressure.

## 2023-01-25 NOTE — Telephone Encounter (Signed)
Pt c/o medication issue:  1. Name of Medication:  Evolocumab (REPATHA SURECLICK) 140 MG/ML SOAJ  2. How are you currently taking this medication (dosage and times per day)?   3. Are you having a reaction (difficulty breathing--STAT)?   4. What is your medication issue?   Patient states her BP has been rising and she assumes Repatha may be the cause. She states that she does not have any BP readings, but they are available on MyChart.

## 2023-01-25 NOTE — Telephone Encounter (Signed)
Spoke to patient Pharm D's advice given.Advised she is due for a yearly follow up appointment.Appointment scheduled with Carlos Levering NP 5/1 at 2:45 pm.Advised to bring B/P readings and a list of all medications.

## 2023-01-25 NOTE — Telephone Encounter (Signed)
Message sent to Pharm D for advice. °

## 2023-01-30 ENCOUNTER — Other Ambulatory Visit (HOSPITAL_COMMUNITY): Payer: Self-pay

## 2023-01-30 ENCOUNTER — Ambulatory Visit (INDEPENDENT_AMBULATORY_CARE_PROVIDER_SITE_OTHER): Payer: Commercial Managed Care - PPO | Admitting: Family Medicine

## 2023-01-30 ENCOUNTER — Encounter (INDEPENDENT_AMBULATORY_CARE_PROVIDER_SITE_OTHER): Payer: Self-pay | Admitting: Family Medicine

## 2023-01-30 VITALS — BP 138/90 | HR 87 | Temp 97.7°F | Ht 66.0 in | Wt 195.8 lb

## 2023-01-30 DIAGNOSIS — E7849 Other hyperlipidemia: Secondary | ICD-10-CM | POA: Diagnosis not present

## 2023-01-30 DIAGNOSIS — E669 Obesity, unspecified: Secondary | ICD-10-CM | POA: Diagnosis not present

## 2023-01-30 DIAGNOSIS — E559 Vitamin D deficiency, unspecified: Secondary | ICD-10-CM | POA: Diagnosis not present

## 2023-01-30 DIAGNOSIS — R7303 Prediabetes: Secondary | ICD-10-CM

## 2023-01-30 DIAGNOSIS — Z6831 Body mass index (BMI) 31.0-31.9, adult: Secondary | ICD-10-CM

## 2023-01-30 MED ORDER — VITAMIN D (ERGOCALCIFEROL) 1.25 MG (50000 UNIT) PO CAPS: 50000.0000 [IU] | ORAL_CAPSULE | ORAL | 0 refills | Status: DC

## 2023-01-30 NOTE — Progress Notes (Signed)
[name], D.O.  ABFM, ABOM Specializing in Clinical Bariatric Medicine  Office located at: [address] [place], [place]  [address]     Assessment and Plan:   No orders of the defined types were placed in this encounter.   There are no discontinued medications.   Meds ordered this encounter  Medications   Vitamin D, Ergocalciferol, (DRISDOL) 1.25 MG (50000 UNIT) CAPS capsule    Sig: Take 1 capsule (50,000 Units total) by mouth every 7 (seven) days.    Dispense:  4 capsule    Refill:  0    Prediabetes Assessment: Condition is new and Not optimized. Labs from PCP and our clinic were reviewed.  Lab Results  Component Value Date   HGBA1C 5.7 12/26/2022   HGBA1C 5.3 02/24/2022   INSULIN 10.5 01/16/2023   Lab Results  Component Value Date   WBC 6.6 12/26/2022   HGB 14.8 12/26/2022   HCT 44.0 12/26/2022   MCV 90.8 12/26/2022   PLT 249.0 12/26/2022   Lab Results  Component Value Date   TSH 1.33 12/26/2022   FREET4 1.19 01/16/2023   Lab Results  Component Value Date   CREATININE 0.93 12/26/2022   BUN 12 12/26/2022   NA 136 12/26/2022   K 4.2 12/26/2022   CL 102 12/26/2022   CO2 27 12/26/2022      Component Value Date/Time   PROT 7.5 12/26/2022 0807   PROT 6.9 11/02/2021 0847   ALBUMIN 4.5 12/26/2022 0807   ALBUMIN 4.4 11/02/2021 0847   AST 14 12/26/2022 0807   ALT 12 12/26/2022 0807   ALKPHOS 66 12/26/2022 0807   BILITOT 0.5 12/26/2022 0807   BILITOT 0.2 11/02/2021 0847   BILIDIR 0.1 12/26/2022 0807   BILIDIR <0.10 11/02/2021 0847   Lab Results  Component Value Date   VITAMINB12 979 (H) 12/26/2022  - Her insulin levels are elevated and 2 times above the normal value.  - Her A1c levels indicate that she is Prediabetic.   - Her blood counts indicate that she is not anemic.  - Her TSH and Freet 4 levels indicate that her Thyroid function is normal.  - She does not take any multivitamin for Vitamin B12. Her B12 levels are within the  recommended range of 500+.  - Labs indicate that her kidney and liver function are normal. Her labs do not indicate any NALFD.  - She endorses drinking approximately 100 ounces of water daily.   Plan:  - Continue to decrease simple carbs/ sugars; increase fiber and proteins -> follow her meal plan.   - Counseling was done on the difference between Insulin Resistance, Prediabetes, and Diabetes.  - Explained role of simple carbs and insulin levels on hunger and cravings - Samamtha will continue to work on weight loss, exercise, via their meal plan we devised to help decrease the risk of progressing to diabetes.  - We will recheck A1c and fasting insulin level in approximately 3 months from last check, or as deemed appropriate.     Other hyperlipidemia Assessment: Condition is Not optimized. Labs from PCP were reviewed.  Lab Results  Component Value Date   CHOL 191 12/26/2022   HDL 71.40 12/26/2022   LDLCALC 104 (H) 12/26/2022   LDLDIRECT 164.5 09/19/2012   TRIG 79.0 12/26/2022   CHOLHDL 3 12/26/2022  - Her LDL is slightly elevated. I recommended her to discuss with Dr.Kelly what her goal LDL should be.  - Her HDL is at goal at  60+.  - No issues with Evolocumab 140 mg every 14 days per / Dr Tresa Endo.  Denies any side effects.   Plan:  - Kattia S Tedeschi agrees to continue with med as recommended by specialist and  our treatment plan of a heart-heathy, low cholesterol meal plan - I stressed the importance that patient continue with our prudent nutritional plan that is low in saturated and trans fats, and low in fatty carbs to improve these numbers.  - We recommend: aerobic activity with eventual goal of a minimum of 150+ min wk plus 2 days/ week of resistance or strength training.   - We will continue routine screening as patient continues to achieve health goals along their weight loss journey     Vitamin D deficiency Assessment: Condition is not optimized. Labs from PCP were reviewed.   Lab Results  Component Value Date   VD25OH 14.37 (L) 12/26/2022   VD25OH 18.32 (L) 02/24/2022   VD25OH 27.23 (L) 12/22/2020  - Her Vitamin D levels are below the recommended range of 50-80. - She was previously on a Vitamin D medication as per her PCP, but stopped because it caused her constipation.   Plan: - Begin Ergocalciferol 50K IU weekly.  - I discussed the importance of vitamin D to the patient's health and well-being as well as to their ability to lose weight.  - I reviewed possible symptoms of low Vitamin D:  low energy, depressed mood, muscle aches, joint aches, osteoporosis etc. with patient - It has been show that administration of vitamin D supplementation leads to improved satiety and a decrease in inflammatory markers.  Hence, low Vitamin D levels may be linked to an increased risk of cardiovascular events and even increased risk of cancers- such as colon and breast. - weight loss will likely improve availability of vitamin D, thus encouraged Elizaveta to continue with meal plan and their weight loss efforts to further improve this condition.  Thus, we will need to monitor levels regularly (every 3-4 mo on average) to keep levels within normal limits and prevent over supplementation.   TREATMENT PLAN FOR OBESITY: Class 1 obesity with serious comorbidity and body mass index (BMI) of 31.0 to 31.9 in adult, unspecified obesity type Assessment: Condition is stable.  Biometric data collected today, was reviewed with patient.  Fat mass has has not changed. Muscle mass has decreased by 1.8 lb. Total body water has increased by 1.4 lb.  - Patient has been compliant with Zepbound 5 mg weekly. Denies any side effects.   Plan:  - Continue with med as prescribed by PCP.  - Dove is currently in the action stage of change. As such, her goal is to continue weight management plan. - Cyrilla will work on healthier eating habits and continue the [employer] and begin journaling  1050-1150 calories and 85+ grams of protein daily.  - We strategized ways that she can increase her protein intake throughout the day.  - I emphasized the importance of journaling for increased awareness and to see if she is hitting her daily calorie and protein goals.  - I recommended her to try Edamame Spaghetti and/or Black Bean Spaghetti.   Behavioral Intervention Additional resources provided today:  Food Journaling log , Insulin Resistance/Prediabetes Handout Evidence-based interventions for health behavior change were utilized today including the discussion of self monitoring techniques, problem-solving barriers and SMART goal setting techniques.   Regarding patient's less desirable eating habits and patterns, we employed the technique of small changes.  Pt will specifically work on: begin journaling intake (1050-1150 calories and 85+ grams of protein) and bring food log  for next visit.    Recommended Physical Activity Goals Evelina has been advised to work up to 150 minutes of moderate intensity aerobic activity a week and strengthening exercises 2-3 times per week for cardiovascular health, weight loss maintenance and preservation of muscle mass.  She has agreed to Continue current level of physical activity   FOLLOW UP: Return in about 2 weeks (around 02/13/2023). She was informed of the importance of frequent follow up visits to maximize her success with intensive lifestyle modifications for her multiple health conditions.  Subjective:   Chief complaint: Obesity Chyna is here to discuss her progress with her obesity treatment plan. She is on the the [facility] and states she is following her eating plan approximately 90% of the time. She states she is not exercising.  Interval History:  Angie S Lawes is here today for her first follow-up office visit since starting the program with [name].  Since last office visit: - She endorses that it was difficult to consume  enough protein daily. - For Breakfast she would typically eat: Eggs, sausage, and a bit of potatoes.  - For Lunch, she would typically eat: [place] Salads and/or Salmon - Last night for dinner, she ate 5 shrimps  with butter beans and Spinach.  - She endorses that she is not eating 6- 8 ounces of protein at dinner and not measuring her protein intake.  - Her following blood pressures at home: 142/101 , 144/104, 156/105, 164/113, 159/108. She endorses that this may be due to the Repatha. She is meeting with her cardiologist to discuss her elevated bp. Today she is asymptomatic, no concerns.    All blood work/ lab tests that were recently ordered by myself or an outside provider were reviewed with patient today per their request. Extended time was spent counseling her on all new disease processes that were discovered or preexisting ones that are affected by BMI.  she understands that many of these abnormalities will need to monitored regularly along with the current treatment plan of prudent dietary changes, in which we are making each and every office visit, to improve these health parameters.  Pharmacotherapy for weight loss: She is currently taking Zepbound for medical weight loss.  Denies side effects.    Review of Systems:  Pertinent positives were addressed with patient today.  Weight Summary and Biometrics   Weight Lost Since Last Visit: 2 lb  Weight Gained Since Last Visit: 0   Vitals Temp: 97.7 F (36.5 C) BP: (!) 138/90 Pulse Rate: 87 SpO2: 98 %   Anthropometric Measurements Height: 5\' 6"  (1.676 m) Weight: 195 lb 12.8 oz (88.8 kg) BMI (Calculated): 31.62 Weight at Last Visit: 197 lb Weight Lost Since Last Visit: 2 lb Weight Gained Since Last Visit: 0 Starting Weight: 197 lb Total Weight Loss (lbs): 2 lb (0.907 kg) Peak Weight: 225 lb   Body Composition  Body Fat %: 38.3 % Fat Mass (lbs): 75 lbs Muscle Mass (lbs): 114.6 lbs Total Body Water (lbs): 80.2  lbs Visceral Fat Rating : 9   Other Clinical Data Fasting: No Labs: No Today's Visit #: 2 Starting Date: 01/16/23    Objective:   PHYSICAL EXAM:  Blood pressure (!) 138/90, pulse 87, temperature 97.7 F (36.5 C), height 5\' 6"  (1.676 m), weight 195 lb 12.8 oz (88.8 kg), SpO2 98 %. Body mass index is 31.6 kg/m.  General: Well Developed, well nourished, and in no acute distress.  HEENT: Normocephalic, atraumatic Skin: Warm and dry, cap RF less 2 sec, good turgor Chest:  Normal excursion, shape, no gross abn Respiratory: speaking in full sentences, no conversational dyspnea NeuroM-Sk: Ambulates w/o assistance, moves * 4 Psych: A and O *3, insight good, mood-full  DIAGNOSTIC DATA REVIEWED:  BMET    Component Value Date/Time   NA 136 12/26/2022 0807   NA 137 07/30/2020 1051   K 4.2 12/26/2022 0807   CL 102 12/26/2022 0807   CO2 27 12/26/2022 0807   GLUCOSE 84 12/26/2022 0807   BUN 12 12/26/2022 0807   BUN 10 07/30/2020 1051   CREATININE 0.93 12/26/2022 0807   CALCIUM 9.6 12/26/2022 0807   GFRNONAA 67 07/30/2020 1051   GFRAA 77 07/30/2020 1051   Lab Results  Component Value Date   HGBA1C 5.7 12/26/2022   HGBA1C 5.3 02/24/2022   Lab Results  Component Value Date   INSULIN 10.5 01/16/2023   Lab Results  Component Value Date   TSH 1.33 12/26/2022   CBC    Component Value Date/Time   WBC 6.6 12/26/2022 0807   RBC 4.85 12/26/2022 0807   HGB 14.8 12/26/2022 0807   HCT 44.0 12/26/2022 0807   PLT 249.0 12/26/2022 0807   MCV 90.8 12/26/2022 0807   MCH 29.8 11/11/2019 2237   MCHC 33.7 12/26/2022 0807   RDW 13.3 12/26/2022 0807   Iron Studies    Component Value Date/Time   IRON 129 12/17/2019 0926   FERRITIN 348 (H) 10/22/2019 0222   IRONPCTSAT 43.1 12/17/2019 0926   Lipid Panel     Component Value Date/Time   CHOL 191 12/26/2022 0807   CHOL 305 (H) 08/21/2022 0958   TRIG 79.0 12/26/2022 0807   HDL 71.40 12/26/2022 0807   HDL 92 08/21/2022 0958    CHOLHDL 3 12/26/2022 0807   VLDL 15.8 12/26/2022 0807   LDLCALC 104 (H) 12/26/2022 0807   LDLCALC 200 (H) 08/21/2022 0958   LDLDIRECT 164.5 09/19/2012 1052   Hepatic Function Panel     Component Value Date/Time   PROT 7.5 12/26/2022 0807   PROT 6.9 11/02/2021 0847   ALBUMIN 4.5 12/26/2022 0807   ALBUMIN 4.4 11/02/2021 0847   AST 14 12/26/2022 0807   ALT 12 12/26/2022 0807   ALKPHOS 66 12/26/2022 0807   BILITOT 0.5 12/26/2022 0807   BILITOT 0.2 11/02/2021 0847   BILIDIR 0.1 12/26/2022 0807   BILIDIR <0.10 11/02/2021 0847      Component Value Date/Time   TSH 1.33 12/26/2022 0807   Nutritional Lab Results  Component Value Date   VD25OH 14.37 (L) 12/26/2022   VD25OH 18.32 (L) 02/24/2022   VD25OH 27.23 (L) 12/22/2020    Attestations:   Reviewed by clinician on day of visit: allergies, medications, problem list, medical history, surgical history, family history, social history, and previous encounter notes.   Patient was in the office today and time spent on visit including pre-visit chart review and post-visit care/coordination of care and electronic medical record documentation was 50 minutes. 50% of the time was in face to face counseling of this patient's medical condition(s) and providing education on treatment options to include the first-line treatment of diet and lifestyle modification.   I,Special Puri,acting as a scribe for [employer], DO.,have documented all relevant documentation on the behalf of Thomasene Lot, DO,as directed by  Thomasene Lot, DO while in the presence of Thomasene Lot, DO.   I, Gavin Pound  Inis Borneman, DO, have reviewed all documentation for this visit. The documentation on 01/30/23 for the exam, diagnosis, procedures, and orders are all accurate and complete.

## 2023-01-31 ENCOUNTER — Encounter: Payer: Self-pay | Admitting: Student

## 2023-01-31 ENCOUNTER — Ambulatory Visit: Payer: Commercial Managed Care - PPO | Attending: Student | Admitting: Student

## 2023-01-31 VITALS — BP 114/80 | HR 85 | Ht 66.0 in | Wt 198.2 lb

## 2023-01-31 DIAGNOSIS — M791 Myalgia, unspecified site: Secondary | ICD-10-CM | POA: Diagnosis not present

## 2023-01-31 DIAGNOSIS — E7849 Other hyperlipidemia: Secondary | ICD-10-CM

## 2023-01-31 DIAGNOSIS — I10 Essential (primary) hypertension: Secondary | ICD-10-CM

## 2023-01-31 DIAGNOSIS — T466X5D Adverse effect of antihyperlipidemic and antiarteriosclerotic drugs, subsequent encounter: Secondary | ICD-10-CM

## 2023-01-31 NOTE — Progress Notes (Signed)
Cardiology Clinic Note   Date: 01/31/2023 ID: Lauren Wolfe, DOB 1976/03/03, MRN [id number]  Primary Cardiologist:  Lauren Guadalajara, MD  Patient Profile    Lauren Wolfe is a 47 y.o. female who presents to the clinic today for elevated BP concerns.  Past medical history significant for: Hypertension. Hyperlipidemia. Lipid panel 12/26/2022: LDL 104, HDL 71, TG 79, total 191. DVT right upper extremity. Venous ultrasound right upper extremity 02/20/2017: DVT within the right basilic vein. Migraines.   History of Present Illness    Lauren Wolfe was first evaluated by Dr. Tresa Wolfe on 01/19/2020 for chest pain at the request of Dr. Delton Wolfe.  Chest pain was thought to be musculoskeletal in nature.  She continues to be followed for the above outlined history.  Echo showed EF 65 to 70%, Grade I DD, no significant valvular abnormalities.  Patient was last seen in the office by Dr. Tresa Wolfe on 03/29/2022 at that time she was doing well from a cardiac standpoint and was instructed to follow-up as needed.  Patient contacted triage on 01/25/2023 with complaints of elevated BP thought to be attributed to Repatha.  Pharm.D. was contacted and reassured Repatha does not affect blood pressure.  Today, patient reports elevated blood pressure since starting Repatha at November 2023.  She brings in the following readings: 4/27 122/88 4/28 12:05 PM 98/62 4/28 12:08 PM 141/94 4/29 145/100 4/30 5 AM 138/85 She does not have her cuff here today to calibrate. She states she was told that her BP is typically higher in one arm vs the other. She has chronic migraines but has not noticed increased intensity of her headaches. She denies dizziness.  She will occasionally have slightly blurred vision in left eye first thing in the morning that decreases on its own. BP today is 114/80. Patient states her BP has not been that low in a long while and would like it repeated. Recheck of BP by me: 120/80 on the right and  130/80 on the left.  She also reports a "funny feeling" in chest when lying on left side.  She describes the feeling as a fluttering on the left side of her chest.  If she turns to her right side she does not have the same feeling but develops right hip pain. She is very frustrated, as she has been intolerant to all statins and Zetia. She was tolerating Praluent well but her insurance will no longer cover it.  She feels the Repatha is not breaking down her LDL as well as the Praluent did.    ROS: All other systems reviewed and are otherwise negative except as noted in History of Present Illness.  Studies Reviewed    ECG is not ordered today.         Physical Exam    VS:  BP 114/80 (BP Location: Left Arm, Patient Position: Sitting, Cuff Size: Large)   Pulse 85   Ht 5\' 6"  (1.676 m)   Wt 198 lb 3.2 oz (89.9 kg)   SpO2 95%   BMI 31.99 kg/m  , BMI Body mass index is 31.99 kg/m.  GEN: Well nourished, well developed, in no acute distress. Neck: No JVD or carotid bruits. Cardiac:  RRR. No murmurs. No rubs or gallops.   Respiratory:  Respirations regular and unlabored. Clear to auscultation without rales, wheezing or rhonchi. GI: Soft, nontender, nondistended. Extremities: Radials/DP/PT 2+ and equal bilaterally. No clubbing or cyanosis. No edema.  Skin: Warm and dry, no rash. Neuro: Strength intact.  Assessment & Plan    Hypertension. BP today 114/80 on intake.  Recheck by me: 120/80 on the right and 130/80 on the left.  Patient denies dizziness.  She reports occasional slightly blurred vision in the left eye for Lauren Wolfe in the morning that resolves on its own.  BP readings from home range from 98-145/62-100.  She denies increased intensity of chronic headaches.  Discussed hesitation in treating BP when it is normal in the office today.  Encourage patient to bring BP cuff to next doctors visit to have it calibrated.  She is instructed to continue to monitor. 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  LDL March 2024 104.  Patient reports not tolerating Repatha.  She is having a "funny feeling described as fluttering if she lays on her left side and hip pain if she lays on her right side.  This began after starting Repatha in November 2023.  She also feels that the medication is not bringing her LDL down as well as Praluent.  She also felt she tolerated family better but her insurance will no longer cover this.  Discussed a trial of stopping Repatha to see if symptoms resolve which she is concerned about doing that secondary to it "shooting up" her cholesterol.  Discussed with Lauren Wolfe, RPH who will contact patient in mid May to see about starting her on inclisiran.  In the meantime it will be up to patient if she wants to skip her next dose of Repatha.  Disposition: Pharm.D. lipid clinic to contact patient regarding changing from Repatha to inclisiran.  Return to general cardiology as needed.         Signed, Lauren Wolfe. Lauren Arista, DNP, NP-C

## 2023-01-31 NOTE — Patient Instructions (Addendum)
Medication Instructions:  Your physician recommends that you continue on your current medications as directed. Please refer to the Current Medication list given to you today.  *If you need a refill on your cardiac medications before your next appointment, please call your pharmacy*  Please take your blood pressure daily and include your heart rates. Make sure to take you BP cuff with you to your next doctors appointment.   HOW TO TAKE YOUR BLOOD PRESSURE: Rest 5 minutes before taking your blood pressure. Don't smoke or drink caffeinated beverages for at least 30 minutes before. Take your blood pressure before (not after) you eat. Sit comfortably with your back supported and both feet on the floor (don't cross your legs). Elevate your arm to heart level on a table or a desk. Use the proper sized cuff. It should fit smoothly and snugly around your bare upper arm. There should be enough room to slip a fingertip under the cuff. The bottom edge of the cuff should be 1 inch above the crease of the elbow. Ideally, take 3 measurements at one sitting and record the average.   Lab Work: NONE If you have labs (blood work) drawn today and your tests are completely normal, you will receive your results only by: MyChart Message (if you have MyChart) OR A paper copy in the mail If you have any lab test that is abnormal or we need to change your treatment, we will call you to review the results.   Testing/Procedures: NONE   Follow-Up: At [facility], you and your health needs are our priority.  As part of our continuing mission to provide you with exceptional heart care, we have created designated Provider Care Teams.  These Care Teams include your primary Cardiologist (physician) and Advanced Practice Providers (APPs -  Physician Assistants and Nurse Practitioners) who all work together to provide you with the care you need, when you need it.  We recommend signing up for the patient portal  called "MyChart".  Sign up information is provided on this After Visit Summary.  MyChart is used to connect with patients for Virtual Visits (Telemedicine).  Patients are able to view lab/test results, encounter notes, upcoming appointments, etc.  Non-urgent messages can be sent to your provider as well.   To learn more about what you can do with MyChart, go to [email or web address].    Your next appointment:   As needed

## 2023-02-05 ENCOUNTER — Encounter (INDEPENDENT_AMBULATORY_CARE_PROVIDER_SITE_OTHER): Payer: Self-pay

## 2023-02-05 ENCOUNTER — Telehealth (INDEPENDENT_AMBULATORY_CARE_PROVIDER_SITE_OTHER): Payer: Commercial Managed Care - PPO | Admitting: Psychology

## 2023-02-05 NOTE — Progress Notes (Unsigned)
Office: [phone]  /  Fax: [phone]    Date: Feb 05, 2023    Appointment Start Time: *** Duration: *** minutes Provider: Lawerance Cruel, Psy.D. Type of Session: Intake for Individual Therapy  Location of Patient: {gbptloc:23249} (private location) Location of Provider: Provider's home (private office) Type of Contact: Telepsychological Visit via MyChart Video Visit  Informed Consent: Prior to proceeding with today's appointment, two pieces of identifying information were obtained. In addition, Lauren Wolfe's physical location at the time of this appointment was obtained as well a phone number she could be reached at in the event of technical difficulties. Lauren Wolfe and this provider participated in today's telepsychological service.   The provider's role was explained to [employer]. The provider reviewed and discussed issues of confidentiality, privacy, and limits therein (e.g., reporting obligations). In addition to verbal informed consent, written informed consent for psychological services was obtained prior to the initial appointment. Since the clinic is not a 24/7 crisis center, mental health emergency resources were shared and this  provider explained MyChart, e-mail, voicemail, and/or other messaging systems should be utilized only for non-emergency reasons. This provider also explained that information obtained during appointments will be placed in Lauren Wolfe's medical record and relevant information will be shared with other providers at [facility] for coordination of care. Lauren Wolfe agreed information may be shared with other [facility] providers as needed for coordination of care and by signing the service agreement document, she provided written consent for coordination of care. Prior to initiating telepsychological services, Lauren Wolfe completed an informed consent document, which included the development of a safety plan (i.e., an emergency contact  and emergency resources) in the event of an emergency/crisis. Lauren Wolfe verbally acknowledged understanding she is ultimately responsible for understanding her insurance benefits for telepsychological and in-person services. This provider also reviewed confidentiality, as it relates to telepsychological services. Lauren Wolfe  acknowledged understanding that appointments cannot be recorded without both party consent and she is aware she is responsible for securing confidentiality on her end of the session. Lauren Wolfe verbally consented to proceed.  Chief Complaint/HPI: Lauren Wolfe was referred by Dr. Thomasene Lot due to  "Other disorder of eating-Emotional Eating" . Per the note for the visit with Dr. Thomasene Lot on 01/16/2023, "Denies any SI/HI. Mood is stable. Her hunger is controlled, but craves breads and sweets. She endorses that she eats when stressed."  During today's appointment, Lauren Wolfe was verbally administered a questionnaire assessing various behaviors related to emotional eating behaviors. Lauren Wolfe endorsed the following: {gbmoodandfood:21755}. She shared she craves ***. Lauren Wolfe believes the onset of emotional eating behaviors was *** and described the current frequency of emotional eating behaviors as ***. In addition, Lauren Wolfe {gblegal:22371} a history of binge eating behaviors. *** Currently, Lauren Wolfe indicated *** triggers emotional eating behaviors, whereas *** makes emotional eating behaviors better. Furthermore, Lauren Wolfe {gblegal:22371} other problems of concern. ***   Mental Status Examination:  Appearance: {Appearance:22431} Behavior: {Behavior:22445} Mood: {gbmood:21757} Affect: {Affect:22436} Speech: {Speech:22432} Eye Contact: {Eye Contact:22433} Psychomotor Activity: {Motor Activity:22434} Gait: unable to assess  Thought Process: {thought process:22448}  Thought Content/Perception: {disturbances:22451} Orientation: {Orientation:22437} Memory/Concentration:  {gbcognition:22449} Insight/Judgment: {Insight:22446}  Family & Psychosocial History: Lauren Wolfe reported she is *** and ***. She indicated she is currently ***. Additionally, Lauren Wolfe shared her highest level of education obtained is ***. Currently, Lauren Wolfe's social support system consists of her ***. Moreover, Lauren Wolfe stated she resides with her ***.   Medical History:  Past Medical History:  Diagnosis Date   Anxiety    Arm vein  blood clot, right    Hyperlipidemia    Knee pain, left 01/2019   Migraine    Vitamin D deficiency    Wears glasses    and contacts   Past Surgical History:  Procedure Laterality Date   BREAST BIOPSY  10/31/2012   Procedure: BREAST BIOPSY WITH NEEDLE LOCALIZATION;  Surgeon: Emelia Loron, MD;  Location: [facility];  Service: General;  Laterality: Right;  right breast wire guided excisional biopsy   BREAST EXCISIONAL BIOPSY Right 2014   ganglion cysts  2011   WRIST SURGERY Left    Current Outpatient Medications on File Prior to Visit  Medication Sig Dispense Refill   botulinum toxin Type A (BOTOX) 200 units injection Inject into head, neck and shoulder every three months 1 each 2   Evolocumab (REPATHA SURECLICK) 140 MG/ML SOAJ Inject 140 mg into the skin every 14 (fourteen) days. 6 mL 3   levonorgestrel (MIRENA) 20 MCG/24HR IUD 1 each by Intrauterine route once.     ondansetron (ZOFRAN-ODT) 4 MG disintegrating tablet TAKE 1 TABLET BY MOUTH EVERY 8 HOURS AS NEEDED FOR NAUSEA AND VOMITING 9 tablet 8   Rimegepant Sulfate (NURTEC) 75 MG TBDP Take 1 tablet (75 mg total) by mouth every other day. 16 tablet 11   SUMAtriptan 6 MG/0.5ML SOAJ Take 1 inj at onset of migraine.  May repeat in 2 hrs, if needed.  Max dose: 2 inj/day. This is a 30 day prescription. 6 mL 5   tirzepatide (ZEPBOUND) 5 MG/0.5ML Pen Inject 5 mg into the skin once a week. 6 mL 11   tiZANidine (ZANAFLEX) 4 MG tablet Take 1 tablet (4 mg total) by mouth every 8 (eight) hours as  needed (For migraines.). 30 tablet 1   Vitamin D, Ergocalciferol, (DRISDOL) 1.25 MG (50000 UNIT) CAPS capsule Take 1 capsule (50,000 Units total) by mouth every 7 (seven) days. 4 capsule 0   No current facility-administered medications on file prior to visit.    Mental Health History: Lauren Wolfe reported ***. She {gblegal:22371} a history of psychotropic medications. Lauren Wolfe {Endorse or deny of item:23407} hospitalizations for psychiatric concerns. Lauren Wolfe {gblegal:22371} a family history of mental health related concerns. *** Lauren Wolfe {Endorse or deny of item:23407} trauma including {gbtrauma:22071} abuse, as well as neglect. ***  Lauren Wolfe described her typical mood lately as ***. Aside from concerns noted above and endorsed on the PHQ-9 and GAD-7, Lauren Wolfe reported ***. Lauren Wolfe {gblegal:22371} current alcohol use. *** She {gblegal:22371} tobacco use. *** She {gblegal:22371} illicit/recreational substance use. Regarding caffeine intake, Akacia reported ***. Furthermore, Lilac indicated she is not experiencing the following: {gbsxs:21965}. She also denied history of and current suicidal ideation, plan, and intent; history of and current homicidal ideation, plan, and intent; and history of and current engagement in self-harm.  The following strengths were reported by Lauren Wolfe: ***. The following strengths were observed by this provider: ability to express thoughts and feelings during the therapeutic session, ability to establish and benefit from a therapeutic relationship, willingness to work toward established goal(s) with the clinic and ability to engage in reciprocal conversation. ***  Legal History: Lauren Wolfe {Endorse or deny of item:23407} legal involvement.   Structured Assessments Results: The Patient Health Questionnaire-9 (PHQ-9) is a self-report measure that assesses symptoms and severity of depression over the course of the last two weeks. Sailor obtained a score of ***  suggesting {GBPHQ9SEVERITY:21752}. Jarissa finds the endorsed symptoms to be {gbphq9difficulty:21754}. [0= Not at all; 1= Several days; 2= More than half the days; 3= Nearly  every day] Little interest or pleasure in doing things ***  Feeling down, depressed, or hopeless ***  Trouble falling or staying asleep, or sleeping too much ***  Feeling tired or having little energy ***  Poor appetite or overeating ***  Feeling bad about yourself --- or that you are a failure or have let yourself or your family down ***  Trouble concentrating on things, such as reading the newspaper or watching television ***  Moving or speaking so slowly that other people could have noticed? Or the opposite --- being so fidgety or restless that you have been moving around a lot more than usual ***  Thoughts that you would be better off dead or hurting yourself in some way ***  PHQ-9 Score ***    The Generalized Anxiety Disorder-7 (GAD-7) is a brief self-report measure that assesses symptoms of anxiety over the course of the last two weeks. Garnet obtained a score of *** suggesting {gbgad7severity:21753}. Aitana finds the endorsed symptoms to be {gbphq9difficulty:21754}. [0= Not at all; 1= Several days; 2= Over half the days; 3= Nearly every day] Feeling nervous, anxious, on edge ***  Not being able to stop or control worrying ***  Worrying too much about different things ***  Trouble relaxing ***  Being so restless that it's hard to sit still ***  Becoming easily annoyed or irritable ***  Feeling afraid as if something awful might happen ***  GAD-7 Score ***   Interventions:  {Interventions List for Intake:23406}  Diagnostic Impressions & Provisional DSM-5 Diagnosis(es): Based on the aforementioned, the following diagnosis(es) were assigned: {Diagnoses:22752}.  Plan: Velna appears able and willing to participate as evidenced by collaboration on a treatment goal, engagement in reciprocal conversation,  and asking questions as needed for clarification. The next appointment is scheduled for *** at ***, which will be {gbtxmodality:23402}. The following treatment goal was established: {gbtxgoals:21759}. This provider will regularly review the treatment plan and medical chart to keep informed of status changes. Lauren Wolfe expressed understanding and agreement with the initial treatment plan of care. *** Jazmon will be sent a handout via e-mail to utilize between now and the next appointment to increase awareness of hunger patterns and subsequent eating. Zeynep provided verbal consent during today's appointment for this provider to send the handout via e-mail. ***

## 2023-02-13 ENCOUNTER — Encounter (INDEPENDENT_AMBULATORY_CARE_PROVIDER_SITE_OTHER): Payer: Self-pay | Admitting: Family Medicine

## 2023-02-13 ENCOUNTER — Other Ambulatory Visit (HOSPITAL_COMMUNITY): Payer: Self-pay

## 2023-02-13 ENCOUNTER — Ambulatory Visit (INDEPENDENT_AMBULATORY_CARE_PROVIDER_SITE_OTHER): Payer: Commercial Managed Care - PPO | Admitting: Family Medicine

## 2023-02-13 VITALS — BP 129/90 | HR 83 | Temp 97.8°F | Ht 66.0 in | Wt 192.0 lb

## 2023-02-13 DIAGNOSIS — R7303 Prediabetes: Secondary | ICD-10-CM | POA: Diagnosis not present

## 2023-02-13 DIAGNOSIS — E668 Other obesity: Secondary | ICD-10-CM

## 2023-02-13 DIAGNOSIS — Z6830 Body mass index (BMI) 30.0-30.9, adult: Secondary | ICD-10-CM | POA: Diagnosis not present

## 2023-02-13 DIAGNOSIS — E669 Obesity, unspecified: Secondary | ICD-10-CM

## 2023-02-13 DIAGNOSIS — E559 Vitamin D deficiency, unspecified: Secondary | ICD-10-CM

## 2023-02-13 DIAGNOSIS — Z6831 Body mass index (BMI) 31.0-31.9, adult: Secondary | ICD-10-CM

## 2023-02-13 MED ORDER — VITAMIN D (ERGOCALCIFEROL) 1.25 MG (50000 UNIT) PO CAPS: 50000.0000 [IU] | ORAL_CAPSULE | ORAL | 0 refills | Status: DC

## 2023-02-13 NOTE — Progress Notes (Signed)
Lauren Wolfe, D.O.  ABFM, ABOM Specializing in Clinical Bariatric Medicine  Office located at: [address] [place], [place]  [address]     Assessment and Plan:   No orders of the defined types were placed in this encounter.   Medications Discontinued During This Encounter  Medication Reason   Vitamin D, Ergocalciferol, (DRISDOL) 1.25 MG (50000 UNIT) CAPS capsule Reorder     Meds ordered this encounter  Medications   Vitamin D, Ergocalciferol, (DRISDOL) 1.25 MG (50000 UNIT) CAPS capsule    Sig: Take 1 capsule (50,000 Units total) by mouth every 7 (seven) days.    Dispense:  4 capsule    Refill:  0     Vitamin D deficiency Assessment: Condition is not optimized. Lab Results  Component Value Date   VD25OH 14.37 (L) 12/26/2022   VD25OH 18.32 (L) 02/24/2022   VD25OH 27.23 (L) 12/22/2020  - No issues with Ergocalciferol 50K IU weekly. Denies any side effects.   Plan: - Continue with med as prescribed. Will refill this today.   - Will continue to monitor to keep levels within normal limits and prevent over supplementation.   Prediabetes Assessment: Condition is not optimized. Lab Results  Component Value Date   HGBA1C 5.7 12/26/2022   HGBA1C 5.3 02/24/2022   INSULIN 10.5 01/16/2023  - Patient is not on any prediabetic medication. This is diet controlled.  - Her hunger and cravings are controlled when eating on plan.   Plan:    - Lauren Wolfe will continue to work on weight loss, exercise, via their meal plan we devised to help decrease the risk of progressing to diabetes.  - Will continue to monitor A1c and Insulin levels.    TREATMENT PLAN FOR OBESITY: Class 1 obesity with serious comorbidity and body mass index (BMI) of 31.0 to 31.9 in adult, unspecified obesity type Assessment:  Lauren Wolfe is here to discuss her progress with her obesity treatment plan along with follow-up of her obesity related diagnoses. See Medical Weight Management  Flowsheet for complete bioelectrical impedance results.  Condition is improving, but not optimized.   Since last office visit on 01/30/23 patient's Fat mass has decreased by 2.6 lb. Muscle mass has decreased by 0.6 lb. Total body water has decreased by 0.6 lb.  Counseling done on how various foods will affect these numbers and how to maximize success  Total lbs lost to date: 5 Total weight loss percentage to date: 2.54   She reports good compliance and tolerance with Zepbound 5 mg weekly. Denies any side effects.  She does not notice any difference in her hunger/cravings on the Zepbound.   Plan:  - Continue with med as prescribed by Dr.John.  Lauren Wolfe will work on healthier eating habits and continue with keeping a food journal and adhering to recommended goals of 1050-1150 calories and 80+ protein and using the Pescatarian Plan as a guide.   Behavioral Intervention Additional resources provided today: Pescatarian plan handout, Journal Log handout Evidence-based interventions for health behavior change were utilized today including the discussion of self monitoring techniques, problem-solving barriers and SMART goal setting techniques.   Regarding patient's less desirable eating habits and patterns, we employed the technique of small changes.  Pt will specifically work on: continue to journal and bring log for next visit.    Recommended Physical Activity Goals  Lauren Wolfe has been advised to slowly work up to 150 minutes of moderate intensity aerobic activity a week and strengthening exercises 2-3 times  per week for cardiovascular health, weight loss maintenance and preservation of muscle mass.   She has agreed to Continue current level of physical activity    FOLLOW UP: Return in about 2 weeks (around 02/27/2023). She was informed of the importance of frequent follow up visits to maximize her success with intensive lifestyle modifications for her multiple health  conditions.  Subjective:   Chief complaint: Obesity Lauren Wolfe is here to discuss her progress with her obesity treatment plan. She is on the keeping a food journal and adhering to recommended goals of 1050-1150 calories and 80+ protein and the Pescatarian Plan and states she is following her eating plan approximately 60% of the time. She states she is not exercising.   Interval History:  Lauren Wolfe is here for a follow up office visit.   Since last office visit:   - She has been walking around her office.  - She has been meal prepping more and not eating outside foods.  - Some of her protein values consumed since last OV: 55.1 grams, 66.9 grams, and 47.1 grams. Overall, her journaling data shows that she is under in protein, however she is working hard on increasing her protein intake.  - Some of her calories consumed since last OV: 972, 1005, 1405.  - Her hunger and cravings are controlled when eating on plan.   Pharmacotherapy for weight loss: She is currently taking  Zepbound  for medical weight loss.  Denies side effects.    Review of Systems:  Pertinent positives were addressed with patient today.   Weight Summary and Biometrics   Weight Lost Since Last Visit: 3 lb  Weight Gained Since Last Visit: 0    Vitals Temp: 97.8 F (36.6 C) BP: (!) 129/90 Pulse Rate: 83 SpO2: 94 %   Anthropometric Measurements Height: 5\' 6"  (1.676 m) Weight: 192 lb (87.1 kg) BMI (Calculated): 31 Weight at Last Visit: 195 lb Weight Lost Since Last Visit: 3 lb Weight Gained Since Last Visit: 0 Starting Weight: 197 lb Total Weight Loss (lbs): 5 lb (2.268 kg) Peak Weight: 225 lb   Body Composition  Body Fat %: 37.6 % Fat Mass (lbs): 72.4 lbs Muscle Mass (lbs): 114 lbs Total Body Water (lbs): 79.6 lbs Visceral Fat Rating : 8   Other Clinical Data Fasting: No Labs: No Today's Visit #: 3 Starting Date: 01/16/23   Objective:   PHYSICAL EXAM: Blood pressure (!) 129/90,  pulse 83, temperature 97.8 F (36.6 C), height 5\' 6"  (1.676 m), weight 192 lb (87.1 kg), SpO2 94 %. Body mass index is 30.99 kg/m.  General: Well Developed, well nourished, and in no acute distress.  HEENT: Normocephalic, atraumatic Skin: Warm and dry, cap RF less 2 sec, good turgor Chest:  Normal excursion, shape, no gross abn Respiratory: speaking in full sentences, no conversational dyspnea NeuroM-Sk: Ambulates w/o assistance, moves * 4 Psych: A and O *3, insight good, mood-full  DIAGNOSTIC DATA REVIEWED:  BMET    Component Value Date/Time   NA 136 12/26/2022 0807   NA 137 07/30/2020 1051   K 4.2 12/26/2022 0807   CL 102 12/26/2022 0807   CO2 27 12/26/2022 0807   GLUCOSE 84 12/26/2022 0807   BUN 12 12/26/2022 0807   BUN 10 07/30/2020 1051   CREATININE 0.93 12/26/2022 0807   CALCIUM 9.6 12/26/2022 0807   GFRNONAA 67 07/30/2020 1051   GFRAA 77 07/30/2020 1051   Lab Results  Component Value Date   HGBA1C 5.7 12/26/2022  HGBA1C 5.3 02/24/2022   Lab Results  Component Value Date   INSULIN 10.5 01/16/2023   Lab Results  Component Value Date   TSH 1.33 12/26/2022   CBC    Component Value Date/Time   WBC 6.6 12/26/2022 0807   RBC 4.85 12/26/2022 0807   HGB 14.8 12/26/2022 0807   HCT 44.0 12/26/2022 0807   PLT 249.0 12/26/2022 0807   MCV 90.8 12/26/2022 0807   MCH 29.8 11/11/2019 2237   MCHC 33.7 12/26/2022 0807   RDW 13.3 12/26/2022 0807   Iron Studies    Component Value Date/Time   IRON 129 12/17/2019 0926   FERRITIN 348 (H) 10/22/2019 0222   IRONPCTSAT 43.1 12/17/2019 0926   Lipid Panel     Component Value Date/Time   CHOL 191 12/26/2022 0807   CHOL 305 (H) 08/21/2022 0958   TRIG 79.0 12/26/2022 0807   HDL 71.40 12/26/2022 0807   HDL 92 08/21/2022 0958   CHOLHDL 3 12/26/2022 0807   VLDL 15.8 12/26/2022 0807   LDLCALC 104 (H) 12/26/2022 0807   LDLCALC 200 (H) 08/21/2022 0958   LDLDIRECT 164.5 09/19/2012 1052   Hepatic Function Panel      Component Value Date/Time   PROT 7.5 12/26/2022 0807   PROT 6.9 11/02/2021 0847   ALBUMIN 4.5 12/26/2022 0807   ALBUMIN 4.4 11/02/2021 0847   AST 14 12/26/2022 0807   ALT 12 12/26/2022 0807   ALKPHOS 66 12/26/2022 0807   BILITOT 0.5 12/26/2022 0807   BILITOT 0.2 11/02/2021 0847   BILIDIR 0.1 12/26/2022 0807   BILIDIR <0.10 11/02/2021 0847      Component Value Date/Time   TSH 1.33 12/26/2022 0807   Nutritional Lab Results  Component Value Date   VD25OH 14.37 (L) 12/26/2022   VD25OH 18.32 (L) 02/24/2022   VD25OH 27.23 (L) 12/22/2020    Attestations:   Reviewed by clinician on day of visit: allergies, medications, problem list, medical history, surgical history, family history, social history, and previous encounter notes.   I,Lauren Wolfe,acting as a Neurosurgeon for [employer], DO.,have documented all relevant documentation on the behalf of Lauren Lot, DO,as directed by  Lauren Lot, DO while in the presence of Lauren Lot, DO.   I, Lauren Lot, DO, have reviewed all documentation for this visit. The documentation on 02/13/23 for the exam, diagnosis, procedures, and orders are all accurate and complete.

## 2023-02-15 ENCOUNTER — Other Ambulatory Visit (HOSPITAL_COMMUNITY): Payer: Self-pay

## 2023-02-20 ENCOUNTER — Other Ambulatory Visit (HOSPITAL_COMMUNITY): Payer: Self-pay

## 2023-02-27 ENCOUNTER — Telehealth (INDEPENDENT_AMBULATORY_CARE_PROVIDER_SITE_OTHER): Payer: Commercial Managed Care - PPO | Admitting: Psychology

## 2023-02-27 ENCOUNTER — Other Ambulatory Visit: Payer: Self-pay | Admitting: Pharmacist Clinician (PhC)/ Clinical Pharmacy Specialist

## 2023-02-27 DIAGNOSIS — F5089 Other specified eating disorder: Secondary | ICD-10-CM | POA: Diagnosis not present

## 2023-02-27 NOTE — Progress Notes (Signed)
Office: [phone]  /  Fax: [phone]    Date: Feb 27, 2023    Appointment Start Time: 3:02pm Duration: 31 minutes Provider: Lawerance Cruel, Psy.D. Type of Session: Intake for Individual Therapy  Location of Patient: Home (private location) Location of Provider: Provider's home (private office) Type of Contact: Telepsychological Visit via MyChart Video Visit  Informed Consent: Prior to proceeding with today's appointment, two pieces of identifying information were obtained. In addition, Lauren Wolfe's physical location at the time of this appointment was obtained as well a phone number she could be reached at in the event of technical difficulties. Lauren Wolfe and this provider participated in today's telepsychological service.   The provider's role was explained to [employer]. The provider reviewed and discussed issues of confidentiality, privacy, and limits therein (e.g., reporting obligations). In addition to verbal informed consent, written informed consent for psychological services was obtained prior to the initial appointment. Since the clinic is not a 24/7 crisis center, mental health emergency resources were shared and this  provider explained MyChart, e-mail, voicemail, and/or other messaging systems should be utilized only for non-emergency reasons. This provider also explained that information obtained during appointments will be placed in Lauren Wolfe's medical record and relevant information will be shared with other providers at [facility] for coordination of care. Lauren Wolfe agreed information may be shared with other [facility] providers as needed for coordination of care and by signing the service agreement document, she provided written consent for coordination of care. Prior to initiating telepsychological services, Lauren Wolfe completed an informed consent document, which included the development of a safety plan (i.e., an emergency contact and  emergency resources) in the event of an emergency/crisis. Lauren Wolfe verbally acknowledged understanding she is ultimately responsible for understanding her insurance benefits for telepsychological and in-person services. This provider also reviewed confidentiality, as it relates to telepsychological services. Lauren Wolfe  acknowledged understanding that appointments cannot be recorded without both party consent and she is aware she is responsible for securing confidentiality on her end of the session. Lauren Wolfe verbally consented to proceed.  Chief Complaint/HPI: Lauren Wolfe was referred by Dr. Thomasene Lot due to  other disorder of eating-emotional eating . Per the note for the visit with Dr. Thomasene Lot on 01/16/2023, "Condition is stable. Denies any SI/HI. Mood is stable. Her hunger is controlled, but craves breads and sweets. She endorses that she eats when stressed."   During today's appointment, Lauren Wolfe was verbally administered a questionnaire assessing various behaviors related to emotional eating behaviors. Lauren Wolfe endorsed the following: eat certain foods when you are anxious, stressed, depressed, or your feelings are hurt. She shared she craves sweets. Lauren Wolfe indicated she is unsure regarding the onset of emotional eating behaviors. She described the current frequency of emotional eating behaviors as weekly. In addition, Lauren Wolfe denied a history of binge eating behaviors. Lauren Wolfe denied a history of significantly restricting food intake, purging and engagement in other compensatory strategies for weight loss, and has never been diagnosed with an eating disorder. She also denied a history of treatment for emotional eating behaviors. Currently, she indicated an improvement in adherence to her structured meal plan as the weeks go by. Furthermore, Lauren Wolfe denied other problems of concern.    Mental Status Examination:  Appearance: neat Behavior: WNL Mood: neutral Affect: mood  congruent Speech: WNL Eye Contact: appropriate Psychomotor Activity: WNL Gait: unable to assess  Thought Process: linear, logical, and goal directed and denies suicidal, homicidal, and self-harm ideation, plan and intent  Thought Content/Perception: no  hallucinations, delusions, bizarre thinking or behavior endorsed or observed Orientation: AAOx4 Memory/Concentration: intact Insight/Judgment: fair  Family & Psychosocial History: Lauren Wolfe reported she is in a relationship and she has an adult daughter, noting she will be a grandmother in November. 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 Additionally, Lauren Wolfe shared her highest level of education obtained is an associate's degree. Currently, Lauren Wolfe's social support system consists of her daughter, boyfriend, and maternal and paternal aunts. Moreover, Lauren Wolfe stated she resides with her boyfriend.   Medical History:  Past Medical History:  Diagnosis Date   Anxiety    Arm vein blood clot, right    Hyperlipidemia    Knee pain, left 01/2019   Migraine    Vitamin D deficiency    Wears glasses    and contacts   Past Surgical History:  Procedure Laterality Date   BREAST BIOPSY  10/31/2012   Procedure: BREAST BIOPSY WITH NEEDLE LOCALIZATION;  Surgeon: Emelia Loron, MD;  Location: [facility];  Service: General;  Laterality: Right;  right breast wire guided excisional biopsy   BREAST EXCISIONAL BIOPSY Right 2014   ganglion cysts  2011   WRIST SURGERY Left    Current Outpatient Medications on File Prior to Visit  Medication Sig Dispense Refill   botulinum toxin Type A (BOTOX) 200 units injection Inject into head, neck and shoulder every three months 1 each 2   Evolocumab (REPATHA SURECLICK) 140 MG/ML SOAJ Inject 140 mg into the skin every 14 (fourteen) days. 6 mL 3   levonorgestrel (MIRENA) 20 MCG/24HR IUD 1 each by Intrauterine route once.      ondansetron (ZOFRAN-ODT) 4 MG disintegrating tablet TAKE 1 TABLET BY MOUTH EVERY 8 HOURS AS NEEDED FOR NAUSEA AND VOMITING 9 tablet 8   Rimegepant Sulfate (NURTEC) 75 MG TBDP Take 1 tablet (75 mg total) by mouth every other day. 16 tablet 11   SUMAtriptan 6 MG/0.5ML SOAJ Take 1 inj at onset of migraine.  May repeat in 2 hrs, if needed.  Max dose: 2 inj/day. This is a 30 day prescription. 6 mL 5   tirzepatide (ZEPBOUND) 5 MG/0.5ML Pen Inject 5 mg into the skin once a week. 6 mL 11   tiZANidine (ZANAFLEX) 4 MG tablet Take 1 tablet (4 mg total) by mouth every 8 (eight) hours as needed (For migraines.). 30 tablet 1   Vitamin D, Ergocalciferol, (DRISDOL) 1.25 MG (50000 UNIT) CAPS capsule Take 1 capsule (50,000 Units total) by mouth every 7 (seven) days. 4 capsule 0   No current facility-administered medications on file prior to visit.  Ivry stated she is medication compliant.   Mental Health History: Lauren Wolfe recalled meeting with a clinical psychologist due to chronic pain secondary to migraines approximately 15 years ago for two appointments. She denied a history of psychotropic medications. Lauren Wolfe reported there is no history of hospitalizations for psychiatric concerns. Lauren Wolfe denied a family history of mental health/substance abuse related concerns. Lauren Wolfe reported there is no history of trauma including psychological, physical , and sexual abuse, as well as neglect.   Lauren Wolfe described her typical mood lately as "good." Lauren Wolfe denied current alcohol use. She denied tobacco use. She denied illicit/recreational substance use. Furthermore, Elexis indicated she is not experiencing the following: hallucinations and delusions, paranoia, symptoms of mania , social withdrawal, crying spells, panic attacks, memory concerns, attention and concentration issues, and obsessions and compulsions. She also denied history of and current suicidal ideation,  plan, and intent; history of and  current homicidal ideation, plan, and intent; and history of and current engagement in self-harm.  Legal History: Sharnika reported there is no history of legal involvement.   Structured Assessments Results: The Patient Health Questionnaire-9 (PHQ-9) is a self-report measure that assesses symptoms and severity of depression over the course of the last two weeks. Ronita obtained a score of 1 suggesting minimal depression. Atavia finds the endorsed symptoms to be not difficult at all. [0= Not at all; 1= Several days; 2= More than half the days; 3= Nearly every day] Little interest or pleasure in doing things 0  Feeling down, depressed, or hopeless 0  Trouble falling or staying asleep, or sleeping too much 0  Feeling tired or having little energy 0  Poor appetite or overeating-due to migraines 1  Feeling bad about yourself --- or that you are a failure or have let yourself or your family down 0  Trouble concentrating on things, such as reading the newspaper or watching television 0  Moving or speaking so slowly that other people could have noticed? Or the opposite --- being so fidgety or restless that you have been moving around a lot more than usual 0  Thoughts that you would be better off dead or hurting yourself in some way 0  PHQ-9 Score 1    The Generalized Anxiety Disorder-7 (GAD-7) is a brief self-report measure that assesses symptoms of anxiety over the course of the last two weeks. Silvia obtained a score of 0. [0= Not at all; 1= Several days; 2= Over half the days; 3= Nearly every day] Feeling nervous, anxious, on edge 0  Not being able to stop or control worrying 0  Worrying too much about different things 0  Trouble relaxing 0  Being so restless that it's hard to sit still 0  Becoming easily annoyed or irritable 0  Feeling afraid as if something awful might happen 0  GAD-7 Score 0   Interventions:  Conducted a chart review Focused on rapport building Verbally  administered PHQ-9 and GAD-7 for symptom monitoring Verbally administered Food & Mood questionnaire to assess various behaviors related to emotional eating Provided emphatic reflections and validation Collaborated with patient on a treatment goal  Psychoeducation provided regarding physical versus emotional hunger  Diagnostic Impressions & Provisional DSM-5 Diagnosis(es): Nishita reported she is unsure regarding the onset of emotional eating behaviors, but described the current frequency as weekly. She denied engagement in any other disordered eating behaviors. Based on the aforementioned, the following diagnosis was assigned: F50.89 Other Specified Feeding or Eating Disorder, Emotional Eating Behaviors.  Plan: Fantashia appears able and willing to participate as evidenced by collaboration on a treatment goal, engagement in reciprocal conversation, and asking questions as needed for clarification. Based on appointment availability and Jariyah's schedule, the next appointment is scheduled for 04/02/2023 at 8am, which will be via MyChart Video Visit. The following treatment goal was established: increase coping skills. This provider will regularly review the treatment plan and medical chart to keep informed of status changes. Maudy expressed understanding and agreement with the initial treatment plan of care. Carmyn will be sent a handout via e-mail to utilize between now and the next appointment to increase awareness of hunger patterns and subsequent eating. Melayah provided verbal consent during today's appointment for this provider to send the handout via e-mail.

## 2023-02-28 ENCOUNTER — Other Ambulatory Visit: Payer: Self-pay | Admitting: Pharmacist Clinician (PhC)/ Clinical Pharmacy Specialist

## 2023-02-28 ENCOUNTER — Ambulatory Visit (INDEPENDENT_AMBULATORY_CARE_PROVIDER_SITE_OTHER): Payer: Commercial Managed Care - PPO | Admitting: Neurology

## 2023-02-28 VITALS — BP 128/90

## 2023-02-28 DIAGNOSIS — G43811 Other migraine, intractable, with status migrainosus: Secondary | ICD-10-CM | POA: Diagnosis not present

## 2023-02-28 DIAGNOSIS — G43709 Chronic migraine without aura, not intractable, without status migrainosus: Secondary | ICD-10-CM | POA: Diagnosis not present

## 2023-02-28 MED ORDER — SUMATRIPTAN SUCCINATE 6 MG/0.5ML ~~LOC~~ SOAJ: SUBCUTANEOUS | 5 refills | Status: AC

## 2023-02-28 MED ORDER — ONABOTULINUMTOXINA 200 UNITS IJ SOLR
155.0000 [IU] | INTRAMUSCULAR | Status: AC
Start: 2023-02-28 — End: ?
  Administered 2023-02-28: 155 [IU] via INTRAMUSCULAR

## 2023-02-28 MED ORDER — TIZANIDINE HCL 4 MG PO TABS: 4.0000 mg | ORAL_TABLET | Freq: Three times a day (TID) | ORAL | 1 refills | Status: DC | PRN

## 2023-02-28 NOTE — Progress Notes (Signed)
BOTOX PROCEDURE NOTE FOR MIGRAINE HEADACHE  HISTORY: Lauren Wolfe is here for Botox injection.  This is her fourth injection.  Last was 11/28/22. Botox works very well for her.  Prior to Botox was having 20 migraines a month. Now 1-2 mild headaches a month, may take Nurtec 1-2 times a week. It works well. More headaches when Botox has worn off, has had migraine for the past 3 days. Hasn't used Imitrex Injection, has Zofran and Tizanidine if needed.    Description of procedure:  The patient was placed in a sitting position. The standard protocol was used for Botox as follows, with 5 units of Botox injected at each site:   -Procerus muscle, midline injection  -Corrugator muscle, bilateral injection  -Frontalis muscle, bilateral injection, with 2 sites each side, medial injection was performed in the upper one third of the frontalis muscle, in the region vertical from the medial inferior edge of the superior orbital rim. The lateral injection was again in the upper one third of the forehead vertically above the lateral limbus of the cornea, 1.5 cm lateral to the medial injection site.  -Temporalis muscle injection, 4 sites, bilaterally. The first injection was 3 cm above the tragus of the ear, second injection site was 1.5 cm to 3 cm up from the first injection site in line with the tragus of the ear. The third injection site was 1.5-3 cm forward between the first 2 injection sites. The fourth injection site was 1.5 cm posterior to the second injection site.  -Occipitalis muscle injection, 3 sites, bilaterally. The first injection was done one half way between the occipital protuberance and the tip of the mastoid process behind the ear. The second injection site was done lateral and superior to the first, 1 fingerbreadth from the first injection. The third injection site was 1 fingerbreadth superiorly and medially from the first injection site.  -Cervical paraspinal muscle injection, 2 sites,  bilateral, the first injection site was 1 cm from the midline of the cervical spine, 3 cm inferior to the lower border of the occipital protuberance. The second injection site was 1.5 cm superiorly and laterally to the first injection site.  -Trapezius muscle injection was performed at 3 sites, bilaterally. The first injection site was in the upper trapezius muscle halfway between the inflection point of the neck, and the acromion. The second injection site was one half way between the acromion and the first injection site. The third injection was done between the first injection site and the inflection point of the neck.   A 200 unit bottle of Botox was used, 155 units were injected, the rest of the Botox was wasted. The patient tolerated the procedure well, there were no complications of the above procedure.  Botox NDC [phone] Lot number [id number] Expiration date 03/2025 SP  I refilled her Imitrex injection since she has had a 3-day migraine that Nurtec has not helped.  Reportedly not able to use NSAIDs due to GI upset.  Meds ordered this encounter  Medications   botulinum toxin Type A (BOTOX) injection 155 Units    Botox 200 units x 1 vial  NDC: 0023-3921-02 EXP: 2026/06 LOT#: [id number]  SP   SUMAtriptan 6 MG/0.5ML SOAJ    Sig: Take 1 inj at onset of migraine.  May repeat in 2 hrs, if needed.  Max dose: 2 inj/day. This is a 30 day prescription.    Dispense:  6 mL    Refill:  5   tiZANidine (ZANAFLEX) 4  MG tablet    Sig: Take 1 tablet (4 mg total) by mouth every 8 (eight) hours as needed (For migraines.).    Dispense:  30 tablet    Refill:  1

## 2023-02-28 NOTE — Progress Notes (Signed)
Botox 200 units x 1 vial  NDC: 0023-3921-02 EXP: 2026/06 LOT#: [id number]  SP  Witnessed by Florentina Addison

## 2023-02-28 NOTE — Progress Notes (Signed)
Error - duplicate

## 2023-03-01 ENCOUNTER — Encounter (INDEPENDENT_AMBULATORY_CARE_PROVIDER_SITE_OTHER): Payer: Self-pay | Admitting: Family Medicine

## 2023-03-01 ENCOUNTER — Ambulatory Visit (INDEPENDENT_AMBULATORY_CARE_PROVIDER_SITE_OTHER): Payer: Commercial Managed Care - PPO | Admitting: Family Medicine

## 2023-03-01 VITALS — BP 137/88 | HR 75 | Temp 98.0°F | Ht 66.0 in | Wt 190.0 lb

## 2023-03-01 DIAGNOSIS — F5089 Other specified eating disorder: Secondary | ICD-10-CM

## 2023-03-01 DIAGNOSIS — Z6831 Body mass index (BMI) 31.0-31.9, adult: Secondary | ICD-10-CM

## 2023-03-01 DIAGNOSIS — E669 Obesity, unspecified: Secondary | ICD-10-CM

## 2023-03-01 DIAGNOSIS — E668 Other obesity: Secondary | ICD-10-CM | POA: Diagnosis not present

## 2023-03-01 DIAGNOSIS — E559 Vitamin D deficiency, unspecified: Secondary | ICD-10-CM | POA: Diagnosis not present

## 2023-03-01 DIAGNOSIS — R7303 Prediabetes: Secondary | ICD-10-CM | POA: Diagnosis not present

## 2023-03-01 MED ORDER — VITAMIN D (ERGOCALCIFEROL) 1.25 MG (50000 UNIT) PO CAPS
50000.0000 [IU] | ORAL_CAPSULE | ORAL | 0 refills | Status: DC
Start: 2023-03-01 — End: 2023-03-20

## 2023-03-01 NOTE — Progress Notes (Signed)
[name], D.O.  ABFM, ABOM Specializing in Clinical Bariatric Medicine  Office located at: [address] [place], [place]  [address]     Assessment and Plan:   Medications Discontinued During This Encounter  Medication Reason   Vitamin D, Ergocalciferol, (DRISDOL) 1.25 MG (50000 UNIT) CAPS capsule Reorder     Meds ordered this encounter  Medications   Vitamin D, Ergocalciferol, (DRISDOL) 1.25 MG (50000 UNIT) CAPS capsule    Sig: Take 1 capsule (50,000 Units total) by mouth every 7 (seven) days.    Dispense:  4 capsule    Refill:  0     Prediabetes Assessment: Condition is not optimized. Lab Results  Component Value Date   HGBA1C 5.7 12/26/2022   HGBA1C 5.3 02/24/2022   INSULIN 10.5 01/16/2023  - This is a diet controlled condition. Pt is not taking any meds for her prediabetes.   - Hunger and cravings are controlled when eating on plan.   Plan: - Continue to decrease simple carbs/ sugars; increase fiber and proteins -> follow her meal plan.   - We will recheck A1c and fasting insulin level in approximately 3 months from last check, or as deemed appropriate.     Vitamin D deficiency Assessment: Condition is not at goal.  Lab Results  Component Value Date   VD25OH 14.37 (L) 12/26/2022   VD25OH 18.32 (L) 02/24/2022   VD25OH 27.23 (L) 12/22/2020  - She has been compliant and tolerant with Ergocalciferol 50K IU weekly. Denies any adverse effects.  Plan: - Continue with med. Will refill this today.   - Will continue to monitor levels regularly (every 3-4 mo on average) to keep levels within normal limits and prevent over supplementation.   Other Specified Feeding or Eating Disorder, Emotional Eating Behaviors Assessment: Condition is improving, but not optimized. - Denies any SI/HI. Mood is stable. - She has been meeting with Dr.Barker and endorses that it has been helpful learning new techniques to deal with emotional/non-hunger eating behaviors.    Plan:  - Continue meeting with Dr.Barker.  - Continue her prudent nutritional plan that is low in simple carbohydrates, saturated fats and trans fats to goal of 5-10% weight loss to achieve significant health benefits.  Pt encouraged to continually advance exercise and cardiovascular fitness as tolerated throughout weight loss journey.  - We will continue to monitor closely.   TREATMENT PLAN FOR OBESITY: Class 1 obesity with serious comorbidity and body mass index (BMI) of 31.0 to 31.9 in adult, unspecified obesity type Assessment:  Lauren Wolfe is here to discuss her progress with her obesity treatment plan along with follow-up of her obesity related diagnoses. See Medical Weight Management Flowsheet for complete bioelectrical impedance results.  Condition is not optimized. Biometric data collected today, was reviewed with patient.   Since last office visit on 02/13/23 patient's  Muscle mass has decreased by 1.8 lb. Fat mass has mot changed. Total body water has decreased by 0.8 lb.  Counseling done on how various foods will affect these numbers and how to maximize success  Total lbs lost to date: 7  Total weight loss percentage to date: 3.55   She has been compliant with Zepbound 5 mg weekly. Denies any adverse effects.   Plan:  - Continue with med as recommended by Dr.John.  - Continue with keeping a food journal and adhering to recommended goals of 1050-1150 calories and 80+ protein and using the Pescatarian Plan as a guide.   Behavioral Intervention  Additional resources provided today:  List of high protein, low calorie food items.  Evidence-based interventions for health behavior change were utilized today including the discussion of self monitoring techniques, problem-solving barriers and SMART goal setting techniques.   Regarding patient's less desirable eating habits and patterns, we employed the technique of small changes.  Pt will specifically work on: continuing to  journal and increase caloric/ protein intake  for next visit.    Recommended Physical Activity Goals  Lauren Wolfe has been advised to slowly work up to 150 minutes of moderate intensity aerobic activity a week and strengthening exercises 2-3 times per week for cardiovascular health, weight loss maintenance and preservation of muscle mass.   She has agreed to Continue current level of physical activity    FOLLOW UP: Return in about 2 weeks (around 03/15/2023). She was informed of the importance of frequent follow up visits to maximize her success with intensive lifestyle modifications for her multiple health conditions.   Subjective:   Chief complaint: Obesity Lauren Wolfe is here to discuss her progress with her obesity treatment plan. She is on the keeping a food journal and adhering to recommended goals of 1050-1150 calories and 80+ protein and the Pescatarian Plan and states she is following her eating plan approximately 75% of the time. She states she is not exercising.   Interval History:  Lauren Wolfe is here for a follow up office visit.     Since last office visit:   - She has not been eating out and has been working on increasing her protein intake.  - Endorses that journaling has increased her awareness about her eating habits.  - Her journaling log indicates that  she is getting closer to her protein goals, however is still low in calories (i.e 973, 783, 530, 648).  - On May 27th, she was sick and ate only 130 calories and 11 grams of protein the entire day. - Overall, she hit her protein goal 5 times and her calorie goal once.   Pharmacotherapy for weight loss: She is currently taking  Zepbound  for medical weight loss.  Denies side effects.    Review of Systems:  Pertinent positives were addressed with patient today.  Weight Summary and Biometrics   Weight Lost Since Last Visit: 2 lb  Weight Gained Since Last Visit: 0    Vitals Temp: 98 F (36.7 C) BP:  137/88 Pulse Rate: 75 SpO2: 99 %   Anthropometric Measurements Height: 5\' 6"  (1.676 m) Weight: 190 lb (86.2 kg) BMI (Calculated): 30.68 Weight at Last Visit: 192 lb Weight Lost Since Last Visit: 2 lb Weight Gained Since Last Visit: 0 Starting Weight: 197 lb Total Weight Loss (lbs): 7 lb (3.175 kg) Peak Weight: 225 lb   Body Composition  Body Fat %: 38 % Fat Mass (lbs): 72.4 lbs Muscle Mass (lbs): 112.2 lbs Total Body Water (lbs): 78.8 lbs Visceral Fat Rating : 8   Other Clinical Data Fasting: No Labs: No Today's Visit #: 4 Starting Date: 01/16/23   Objective:   PHYSICAL EXAM: Blood pressure 137/88, pulse 75, temperature 98 F (36.7 C), height 5\' 6"  (1.676 m), weight 190 lb (86.2 kg), SpO2 99 %. Body mass index is 30.67 kg/m.  General: Well Developed, well nourished, and in no acute distress.  HEENT: Normocephalic, atraumatic Skin: Warm and dry, cap RF less 2 sec, good turgor Chest:  Normal excursion, shape, no gross abn Respiratory: speaking in full sentences, no conversational dyspnea NeuroM-Sk: Ambulates w/o assistance, moves *  4 Psych: A and O *3, insight good, mood-full  DIAGNOSTIC DATA REVIEWED:  BMET    Component Value Date/Time   NA 136 12/26/2022 0807   NA 137 07/30/2020 1051   K 4.2 12/26/2022 0807   CL 102 12/26/2022 0807   CO2 27 12/26/2022 0807   GLUCOSE 84 12/26/2022 0807   BUN 12 12/26/2022 0807   BUN 10 07/30/2020 1051   CREATININE 0.93 12/26/2022 0807   CALCIUM 9.6 12/26/2022 0807   GFRNONAA 67 07/30/2020 1051   GFRAA 77 07/30/2020 1051   Lab Results  Component Value Date   HGBA1C 5.7 12/26/2022   HGBA1C 5.3 02/24/2022   Lab Results  Component Value Date   INSULIN 10.5 01/16/2023   Lab Results  Component Value Date   TSH 1.33 12/26/2022   CBC    Component Value Date/Time   WBC 6.6 12/26/2022 0807   RBC 4.85 12/26/2022 0807   HGB 14.8 12/26/2022 0807   HCT 44.0 12/26/2022 0807   PLT 249.0 12/26/2022 0807   MCV 90.8  12/26/2022 0807   MCH 29.8 11/11/2019 2237   MCHC 33.7 12/26/2022 0807   RDW 13.3 12/26/2022 0807   Iron Studies    Component Value Date/Time   IRON 129 12/17/2019 0926   FERRITIN 348 (H) 10/22/2019 0222   IRONPCTSAT 43.1 12/17/2019 0926   Lipid Panel     Component Value Date/Time   CHOL 191 12/26/2022 0807   CHOL 305 (H) 08/21/2022 0958   TRIG 79.0 12/26/2022 0807   HDL 71.40 12/26/2022 0807   HDL 92 08/21/2022 0958   CHOLHDL 3 12/26/2022 0807   VLDL 15.8 12/26/2022 0807   LDLCALC 104 (H) 12/26/2022 0807   LDLCALC 200 (H) 08/21/2022 0958   LDLDIRECT 164.5 09/19/2012 1052   Hepatic Function Panel     Component Value Date/Time   PROT 7.5 12/26/2022 0807   PROT 6.9 11/02/2021 0847   ALBUMIN 4.5 12/26/2022 0807   ALBUMIN 4.4 11/02/2021 0847   AST 14 12/26/2022 0807   ALT 12 12/26/2022 0807   ALKPHOS 66 12/26/2022 0807   BILITOT 0.5 12/26/2022 0807   BILITOT 0.2 11/02/2021 0847   BILIDIR 0.1 12/26/2022 0807   BILIDIR <0.10 11/02/2021 0847      Component Value Date/Time   TSH 1.33 12/26/2022 0807   Nutritional Lab Results  Component Value Date   VD25OH 14.37 (L) 12/26/2022   VD25OH 18.32 (L) 02/24/2022   VD25OH 27.23 (L) 12/22/2020    Attestations:   Reviewed by clinician on day of visit: allergies, medications, problem list, medical history, surgical history, family history, social history, and previous encounter notes.   I,Special Puri,acting as a Neurosurgeon for [employer], DO.,have documented all relevant documentation on the behalf of Thomasene Lot, DO,as directed by  Thomasene Lot, DO while in the presence of Thomasene Lot, DO.   I, Thomasene Lot, DO, have reviewed all documentation for this visit. The documentation on 03/01/23 for the exam, diagnosis, procedures, and orders are all accurate and complete.

## 2023-03-05 NOTE — Telephone Encounter (Signed)
Message sent to Infusion center to see if orders received

## 2023-03-05 NOTE — Telephone Encounter (Signed)
Orders sent to Infusion center for Leqvio.  Patient aware to expect call

## 2023-03-17 ENCOUNTER — Other Ambulatory Visit (HOSPITAL_COMMUNITY): Payer: Self-pay

## 2023-03-20 ENCOUNTER — Encounter (INDEPENDENT_AMBULATORY_CARE_PROVIDER_SITE_OTHER): Payer: Self-pay | Admitting: Family Medicine

## 2023-03-20 ENCOUNTER — Ambulatory Visit (INDEPENDENT_AMBULATORY_CARE_PROVIDER_SITE_OTHER): Payer: Commercial Managed Care - PPO | Admitting: Family Medicine

## 2023-03-20 VITALS — BP 142/86 | HR 81 | Temp 97.7°F | Ht 66.0 in | Wt 186.0 lb

## 2023-03-20 DIAGNOSIS — R7303 Prediabetes: Secondary | ICD-10-CM

## 2023-03-20 DIAGNOSIS — E668 Other obesity: Secondary | ICD-10-CM

## 2023-03-20 DIAGNOSIS — Z6830 Body mass index (BMI) 30.0-30.9, adult: Secondary | ICD-10-CM

## 2023-03-20 DIAGNOSIS — E559 Vitamin D deficiency, unspecified: Secondary | ICD-10-CM

## 2023-03-20 DIAGNOSIS — Z6831 Body mass index (BMI) 31.0-31.9, adult: Secondary | ICD-10-CM

## 2023-03-20 DIAGNOSIS — I10 Essential (primary) hypertension: Secondary | ICD-10-CM | POA: Diagnosis not present

## 2023-03-20 DIAGNOSIS — E66811 Obesity, class 1: Secondary | ICD-10-CM

## 2023-03-20 DIAGNOSIS — E669 Obesity, unspecified: Secondary | ICD-10-CM

## 2023-03-20 MED ORDER — VITAMIN D (ERGOCALCIFEROL) 1.25 MG (50000 UNIT) PO CAPS
50000.0000 [IU] | ORAL_CAPSULE | ORAL | 0 refills | Status: DC
Start: 2023-03-20 — End: 2023-04-09

## 2023-03-20 NOTE — Progress Notes (Signed)
[name], D.O.  ABFM, ABOM Specializing in Clinical Bariatric Medicine  Office located at: [address] [place], [place]  [address]     Assessment and Plan:   Medications Discontinued During This Encounter  Medication Reason   Vitamin D, Ergocalciferol, (DRISDOL) 1.25 MG (50000 UNIT) CAPS capsule Reorder     Meds ordered this encounter  Medications   Vitamin D, Ergocalciferol, (DRISDOL) 1.25 MG (50000 UNIT) CAPS capsule    Sig: Take 1 capsule (50,000 Units total) by mouth every 7 (seven) days.    Dispense:  4 capsule    Refill:  0     Essential hypertension Assessment: Blood pressure is elevated today; pt thinks this is due to "rushing to get to our clinic today". She is completely asymptomatic, reports no chest pain, shortness of breath, or palpitations. She endorses never being on blood pressure medication in the past.   Last 3 blood pressure readings in our office are as follows: BP Readings from Last 3 Encounters:  03/20/23 (!) 154/91  03/01/23 137/88  02/28/23 (!) 128/90   Plan: Continue to work on avoiding foods that are: processed, frozen, or prepackaged to avoid excess salt. Ambulatory blood pressure monitoring encouraged.Reminded patient that if they ever feel poorly in any way, to check their blood pressure and pulse as well.We will continue to monitor closely alongside PCP/ specialists. Pt reminded to also f/up with those individuals as instructed by them. We will continue to monitor symptoms as they relate to the her weight loss journey.    Prediabetes Assessment: Condition is diet/exercise controlled. Pt not taking any prediabetic medication. She endorses that her hunger and cravings are controlled when following the prescribed meal plan.   Lab Results  Component Value Date   HGBA1C 5.7 12/26/2022   HGBA1C 5.3 02/24/2022   INSULIN 10.5 01/16/2023    Plan: Tamelia will continue to work on weight loss, exercise, via their meal plan we devised  to help decrease the risk of progressing to diabetes. We will continue monitor condition closely alongside PCP.   Vitamin D deficiency Assessment: Condition is not at goal. Her Vitamin D deficiency is being treated with Ergocalciferol 50K IU weekly. She endorses tolerating supplement well.    Lab Results  Component Value Date   VD25OH 14.37 (L) 12/26/2022   VD25OH 18.32 (L) 02/24/2022   VD25OH 27.23 (L) 12/22/2020   Plan: Pt advised to maintain with supplement. Will reorder this today.    Weight loss will likely improve availability of vitamin D, thus encouraged Iara to continue with meal plan and their weight loss efforts to further improve this condition.  Thus, we will need to monitor levels regularly (every 3-4 mo on average) to keep levels within normal limits and prevent over supplementation.   TREATMENT PLAN FOR OBESITY: BMI 31.0-31.9,adult - current BMI 30.04  Class 1 obesity with serious comorbidity and body mass index (BMI) of 31.0 to 31.9 in adult, unspecified obesity type Assessment: Temperance S Fleishman is here to discuss her progress with her obesity treatment plan along with follow-up of her obesity related diagnoses. See Medical Weight Management Flowsheet for complete bioelectrical impedance results.  Condition is improving, but not optimized. Biometric data collected today, was reviewed with patient.   Since last office visit on 03/01/23 patient's  Muscle mass has decreased by 1.4 lb. Fat mass has decreased by 2.8 lb. Total body water has decreased by 0.6 lb.  Counseling done on how various foods will affect these numbers  and how to maximize success  Total lbs lost to date: 11  Total weight loss percentage to date:  5.58  Plan: Continue with keeping a food journal and adhering to recommended goals of 1050-1150 calories and 80+ protein and using the Pescatarian Plan as a guide.    Behavioral Intervention Additional resources provided today:  pt  declined Evidence-based interventions for health behavior change were utilized today including the discussion of self monitoring techniques, problem-solving barriers and SMART goal setting techniques.   Regarding patient's less desirable eating habits and patterns, we employed the technique of small changes.  Pt will specifically work on: continue with journaling/bringing food log and walking 40+ minutes, 2-3 days a wk for next visit.    Recommended Physical Activity Goals  Leocadia has been advised to slowly work up to 150 minutes of moderate intensity aerobic activity a week and strengthening exercises 2-3 times per week for cardiovascular health, weight loss maintenance and preservation of muscle mass.   She has agreed to Think about ways to increase daily physical activity and overcoming barriers to exercise  FOLLOW UP: Return in about 3 weeks (around 04/10/2023). She was informed of the importance of frequent follow up visits to maximize her success with intensive lifestyle modifications for her multiple health conditions.   Subjective:   Chief complaint: Obesity Dima is here to discuss her progress with her obesity treatment plan. She is keeping a food journal and adhering to recommended goals of 1050-1150 calories and 80+ protein and the Pescatarian Plan and states she is following her eating plan approximately 80% of the time. She states she is not exercising.   Interval History:  Joleene S Tarango is here for a follow up office visit. Since last OV, Hartlee has been doing okay. The wk prior she was dealing with migraines for 5 consecutive days. Her symptoms have improved. Furthermore, she has been journaling her intake and is intentionally increasing her protein intake. Since her last OV, she reached her calorie goal 6 times and her protein goal 12 times. She endorses feeling satisfied and full with the foods she is eating.    Review of Systems:  Pertinent positives were  addressed with patient today.  Reviewed by clinician on day of visit: allergies, medications, problem list, medical history, surgical history, family history, social history, and previous encounter notes.  Weight Summary and Biometrics   Weight Lost Since Last Visit: 4lb  Weight Gained Since Last Visit: 0    Vitals Temp: 97.7 F (36.5 C) BP: (!) 154/91 Pulse Rate: 81 SpO2: 100 %   Anthropometric Measurements Height: 5\' 6"  (1.676 m) Weight: 186 lb (84.4 kg) BMI (Calculated): 30.04 Weight at Last Visit: 190lb Weight Lost Since Last Visit: 4lb Weight Gained Since Last Visit: 0 Starting Weight: 197lb Total Weight Loss (lbs): 11 lb (4.99 kg) Peak Weight: 225lb   Body Composition  Body Fat %: 37.3 % Fat Mass (lbs): 69.6 lbs Muscle Mass (lbs): 110.8 lbs Total Body Water (lbs): 78.2 lbs Visceral Fat Rating : 8   Other Clinical Data Fasting: no Labs: no Today's Visit #: 5 Starting Date: 01/16/23   Objective:   PHYSICAL EXAM: Blood pressure (!) 154/91, pulse 81, temperature 97.7 F (36.5 C), height 5\' 6"  (1.676 m), weight 186 lb (84.4 kg), SpO2 100 %. Body mass index is 30.02 kg/m.  General: Well Developed, well nourished, and in no acute distress.  HEENT: Normocephalic, atraumatic Skin: Warm and dry, cap RF less 2 sec, good turgor Chest:  Normal excursion, shape, no gross abn Respiratory: speaking in full sentences, no conversational dyspnea NeuroM-Sk: Ambulates w/o assistance, moves * 4 Psych: A and O *3, insight good, mood-full  DIAGNOSTIC DATA REVIEWED:  BMET    Component Value Date/Time   NA 136 12/26/2022 0807   NA 137 07/30/2020 1051   K 4.2 12/26/2022 0807   CL 102 12/26/2022 0807   CO2 27 12/26/2022 0807   GLUCOSE 84 12/26/2022 0807   BUN 12 12/26/2022 0807   BUN 10 07/30/2020 1051   CREATININE 0.93 12/26/2022 0807   CALCIUM 9.6 12/26/2022 0807   GFRNONAA 67 07/30/2020 1051   GFRAA 77 07/30/2020 1051   Lab Results  Component Value Date    HGBA1C 5.7 12/26/2022   HGBA1C 5.3 02/24/2022   Lab Results  Component Value Date   INSULIN 10.5 01/16/2023   Lab Results  Component Value Date   TSH 1.33 12/26/2022   CBC    Component Value Date/Time   WBC 6.6 12/26/2022 0807   RBC 4.85 12/26/2022 0807   HGB 14.8 12/26/2022 0807   HCT 44.0 12/26/2022 0807   PLT 249.0 12/26/2022 0807   MCV 90.8 12/26/2022 0807   MCH 29.8 11/11/2019 2237   MCHC 33.7 12/26/2022 0807   RDW 13.3 12/26/2022 0807   Iron Studies    Component Value Date/Time   IRON 129 12/17/2019 0926   FERRITIN 348 (H) 10/22/2019 0222   IRONPCTSAT 43.1 12/17/2019 0926   Lipid Panel     Component Value Date/Time   CHOL 191 12/26/2022 0807   CHOL 305 (H) 08/21/2022 0958   TRIG 79.0 12/26/2022 0807   HDL 71.40 12/26/2022 0807   HDL 92 08/21/2022 0958   CHOLHDL 3 12/26/2022 0807   VLDL 15.8 12/26/2022 0807   LDLCALC 104 (H) 12/26/2022 0807   LDLCALC 200 (H) 08/21/2022 0958   LDLDIRECT 164.5 09/19/2012 1052   Hepatic Function Panel     Component Value Date/Time   PROT 7.5 12/26/2022 0807   PROT 6.9 11/02/2021 0847   ALBUMIN 4.5 12/26/2022 0807   ALBUMIN 4.4 11/02/2021 0847   AST 14 12/26/2022 0807   ALT 12 12/26/2022 0807   ALKPHOS 66 12/26/2022 0807   BILITOT 0.5 12/26/2022 0807   BILITOT 0.2 11/02/2021 0847   BILIDIR 0.1 12/26/2022 0807   BILIDIR <0.10 11/02/2021 0847      Component Value Date/Time   TSH 1.33 12/26/2022 0807   Nutritional Lab Results  Component Value Date   VD25OH 14.37 (L) 12/26/2022   VD25OH 18.32 (L) 02/24/2022   VD25OH 27.23 (L) 12/22/2020    Attestations:   I, Special Puri, acting as a Stage manager for Thomasene Lot, DO., have compiled all relevant documentation for today's office visit on behalf of Thomasene Lot, DO, while in the presence of [employer], DO.  I have reviewed the above documentation for accuracy and completeness, and I agree with the above. [name], D.O.  The 21st Century  Cures Act was signed into law in 2016 which includes the topic of electronic health records.  This provides immediate access to information in MyChart.  This includes consultation notes, operative notes, office notes, lab results and pathology reports.  If you have any questions about what you read please let us know at your next visit so we can discuss your concerns and take corrective action if need be.  We are right here with you.

## 2023-03-26 ENCOUNTER — Telehealth: Payer: Self-pay

## 2023-03-26 NOTE — Telephone Encounter (Signed)
Auth Submission: APPROVED Site of care: Site of care: [facility] Payer: [employer] Medication & CPT/J Code(s) submitted: Leqvio (Inclisiran) [id number] Route of submission (phone, fax, portal): Fax Phone # Fax # Auth type: Buy/Bill Units/visits requested: 284mg  q 3 months x 2 doses Reference number: [id number] [id number] Approval from: 03/05/2023 to 09/04/2023

## 2023-03-29 ENCOUNTER — Other Ambulatory Visit: Payer: Self-pay | Admitting: Obstetrics and Gynecology

## 2023-03-29 DIAGNOSIS — Z1231 Encounter for screening mammogram for malignant neoplasm of breast: Secondary | ICD-10-CM

## 2023-04-02 ENCOUNTER — Telehealth (INDEPENDENT_AMBULATORY_CARE_PROVIDER_SITE_OTHER): Payer: Commercial Managed Care - PPO | Admitting: Psychology

## 2023-04-02 DIAGNOSIS — F5089 Other specified eating disorder: Secondary | ICD-10-CM | POA: Diagnosis not present

## 2023-04-02 NOTE — Progress Notes (Signed)
  Office: [phone]  /  Fax: [phone]    Date: April 02, 2023  Appointment Start Time: 8:02am Duration: 29 minutes Provider: Lawerance Cruel, Psy.D. Type of Session: Individual Therapy  Location of Patient: Work (private location) Location of Provider: Provider's Home (private office) Type of Contact: Telepsychological Visit via MyChart Video Visit  Session Content: Lauren Wolfe is a 47 y.o. female presenting for a follow-up appointment to address the previously established treatment goal of increasing coping skills.Today's appointment was a telepsychological visit. Lauren Wolfe provided verbal consent for today's telepsychological appointment and she is aware she is responsible for securing confidentiality on her end of the session. Prior to proceeding with today's appointment, Lauren Wolfe's physical location at the time of this appointment was obtained as well a phone number she could be reached at in the event of technical difficulties. Lauren Wolfe and this provider participated in today's telepsychological service.   This provider conducted a brief check-in. Lauren Wolfe reported, "It's been going well." Reviewed emotional and physical hunger. Psychoeducation regarding triggers for emotional eating was provided. Lauren Wolfe was provided a handout, and encouraged to utilize the handout between now and the next appointment to increase awareness of triggers and frequency. Lauren Wolfe agreed. This provider also discussed behavioral strategies for specific triggers, such as placing the utensil down when conversing to avoid mindless eating. Lauren Wolfe provided verbal consent during today's appointment for this provider to send a handout about triggers via e-mail. Lauren Wolfe was receptive to today's appointment as evidenced by openness to sharing, responsiveness to feedback, and willingness to explore triggers for emotional eating.  Mental Status Examination:  Appearance: neat Behavior: appropriate to  circumstances Mood: neutral Affect: mood congruent Speech: WNL Eye Contact: appropriate Psychomotor Activity: WNL Gait: unable to assess Thought Process: linear, logical, and goal directed and no evidence or endorsement of suicidal, homicidal, and self-harm ideation, plan and intent  Thought Content/Perception: no hallucinations, delusions, bizarre thinking or behavior endorsed or observed Orientation: AAOx4 Memory/Concentration: intact Insight: fair Judgment: fair  Interventions:  Conducted a brief chart review Provided empathic reflections and validation Reviewed content from the previous session Provided positive reinforcement Employed supportive psychotherapy interventions to facilitate reduced distress and to improve coping skills with identified stressors Psychoeducation provided regarding triggers for emotional eating behaviors  DSM-5 Diagnosis(es): F50.89 Other Specified Feeding or Eating Disorder, Emotional Eating Behaviors  Treatment Goal & Progress: During the initial appointment with this provider, the following treatment goal was established: increase coping skills. Progress is limited, as [name] has just begun treatment with this provider; however, she is receptive to the interaction and interventions and rapport is being established.   Plan: Based on appointment availability and [name]'s schedule, the next appointment is scheduled for 04/23/2023 at 8am, which will be via MyChart Video Visit. The next session will focus on working towards the established treatment goal.

## 2023-04-04 ENCOUNTER — Ambulatory Visit (INDEPENDENT_AMBULATORY_CARE_PROVIDER_SITE_OTHER): Payer: Commercial Managed Care - PPO

## 2023-04-04 VITALS — BP 126/90 | HR 74 | Temp 97.7°F | Resp 16 | Ht 66.0 in | Wt 186.0 lb

## 2023-04-04 DIAGNOSIS — E7800 Pure hypercholesterolemia, unspecified: Secondary | ICD-10-CM | POA: Diagnosis not present

## 2023-04-04 MED ORDER — INCLISIRAN SODIUM 284 MG/1.5ML ~~LOC~~ SOSY
284.0000 mg | PREFILLED_SYRINGE | Freq: Once | SUBCUTANEOUS | Status: AC
  Administered 2023-04-04: 284 mg via SUBCUTANEOUS
  Filled 2023-04-04: qty 1.5

## 2023-04-04 NOTE — Progress Notes (Addendum)
Diagnosis: Hyperlipidemia  Provider:  Chilton Greathouse MD  Procedure: Injection  Leqvio (inclisiran), Dose: 284 mg, Site: subcutaneous, Number of injections: 1  Post Care: Observation period completed  Discharge: Condition: Good, Destination: Home . AVS Declined  Performed by:  Marlow Baars [place]Burchett, RN

## 2023-04-09 ENCOUNTER — Encounter (INDEPENDENT_AMBULATORY_CARE_PROVIDER_SITE_OTHER): Payer: Self-pay | Admitting: Family Medicine

## 2023-04-09 ENCOUNTER — Ambulatory Visit (INDEPENDENT_AMBULATORY_CARE_PROVIDER_SITE_OTHER): Payer: Commercial Managed Care - PPO | Admitting: Family Medicine

## 2023-04-09 VITALS — BP 135/87 | HR 75 | Temp 98.1°F | Ht 66.0 in | Wt 185.0 lb

## 2023-04-09 DIAGNOSIS — R7303 Prediabetes: Secondary | ICD-10-CM | POA: Diagnosis not present

## 2023-04-09 DIAGNOSIS — I10 Essential (primary) hypertension: Secondary | ICD-10-CM

## 2023-04-09 DIAGNOSIS — E559 Vitamin D deficiency, unspecified: Secondary | ICD-10-CM

## 2023-04-09 DIAGNOSIS — E66811 Obesity, class 1: Secondary | ICD-10-CM

## 2023-04-09 DIAGNOSIS — Z6829 Body mass index (BMI) 29.0-29.9, adult: Secondary | ICD-10-CM

## 2023-04-09 DIAGNOSIS — E669 Obesity, unspecified: Secondary | ICD-10-CM | POA: Diagnosis not present

## 2023-04-09 MED ORDER — VITAMIN D (ERGOCALCIFEROL) 1.25 MG (50000 UNIT) PO CAPS
50000.0000 [IU] | ORAL_CAPSULE | ORAL | 0 refills | Status: DC
Start: 2023-04-09 — End: 2023-05-03

## 2023-04-09 NOTE — Progress Notes (Signed)
[name], D.O.  ABFM, ABOM Specializing in Clinical Bariatric Medicine  Office located at: [address] [place], [place]  [address]     Assessment and Plan:   Medications Discontinued During This Encounter  Medication Reason   Evolocumab (REPATHA SURECLICK) 140 MG/ML SOAJ    Vitamin D, Ergocalciferol, (DRISDOL) 1.25 MG (50000 UNIT) CAPS capsule Reorder     Meds ordered this encounter  Medications   Vitamin D, Ergocalciferol, (DRISDOL) 1.25 MG (50000 UNIT) CAPS capsule    Sig: Take 1 capsule (50,000 Units total) by mouth every 7 (seven) days.    Dispense:  4 capsule    Refill:  0   Recheck labs for her next OV (fasting, insulin, A1c, vitamin D, and CMP)   Vitamin D deficiency Assessment: Condition is Not at goal.. Her vitamin D level is at 14.37 on 12/26/2022. She continues Ergocalciferol 50K IU weekly.   Lab Results  Component Value Date   VD25OH 14.37 (L) 12/26/2022   VD25OH 18.32 (L) 02/24/2022   VD25OH 27.23 (L) 12/22/2020   Plan:  She will continue ERGO 50k lU weekly and Will refill this today.   Her weight loss will likely improve availability of vitamin D, thus encouraged Lauren Wolfe to continue with meal plan and their weight loss efforts to further improve this condition.  Thus, we will need to monitor levels regularly (every 3-4 mo on average) to keep levels within normal limits and prevent over supplementation.   Prediabetes Assessment: Condition is Not optimized. This is diet/exercise controlled. Her hunger and cravings are well controlled.  Lab Results  Component Value Date   HGBA1C 5.7 12/26/2022   HGBA1C 5.3 02/24/2022   INSULIN 10.5 01/16/2023    Plan: She will continue to choose smart choices with food. We will recheck A1c and fasting insulin level at her next OV. She will continue to work on weight loss, exercise, via their meal plan we devised to help decrease the risk of progressing to diabetes. We will continue monitor condition  closely alongside PCP.     Essential hypertension Assessment: Condition is stable at 135/87. This is diet/exercise controled and has never been on BP medication. She is asymptomatic.   Last 3 blood pressure readings in our office are as follows: BP Readings from Last 3 Encounters:  04/09/23 135/87  04/04/23 (!) 126/90  03/20/23 (!) 142/86   Plan:She is to continue lifestyle changes such as following our low salt, heart healthy meal plan and engaging in a regular exercise program discussed and avoid buying foods that are: processed, frozen, or prepackaged to avoid excess salt.Ambulatory blood pressure monitoring encouraged. We will continue to monitor symptoms as they relate to the her weight loss journey.   TREATMENT PLAN FOR OBESITY: BMI 31.0-31.9,adult - current BMI 29.87 Class 1 obesity with serious comorbidity and body mass index (BMI) of 31.0 to 31.9 in adult, unspecified obesity type Assessment:  Lauren Wolfe is here to discuss her progress with her obesity treatment plan along with follow-up of her obesity related diagnoses. See Medical Weight Management Flowsheet for complete bioelectrical impedance results.  Condition is docourse: improving but not optimized. Biometric data collected today, was reviewed with patient.   Since last office visit on 03/20/2023 patient's  Muscle mass has increased by 1.2lb. Fat mass has decreased by 1.2lb. Total body water has increased by 1.2lb.  Counseling done on how various foods will affect these numbers and how to maximize success  Total lbs lost to date: -  12lbs Total weight loss percentage to date: -6.09%  She is taking Zepbound 5mg  once weekly as per her PCP and is tolerating medication well.   Plan: Continue with keeping a food journal and adhering to recommended goals of 1050-1150 calories and 80+ protein and using the Pescatarian Plan as a guide. Continue with Zepbound as advised by PCP.  - I encouraged her to increase her calorie  intake as she does not prefer to eat foods that aren't protein dense allowing her to miss out on calories. I recommend that she add whole grains, nuts, or olive oil to help.   Behavioral Intervention Additional resources provided today: patient declined Evidence-based interventions for health behavior change were utilized today including the discussion of self monitoring techniques, problem-solving barriers and SMART goal setting techniques.   Regarding patient's less desirable eating habits and patterns, we employed the technique of small changes.  Pt will specifically work on: increasing her calorie intake and continuing her walking regimen for next visit.    Recommended Physical Activity Goals  Lauren Wolfe has been advised to slowly work up to 150 minutes of moderate intensity aerobic activity a week and strengthening exercises 2-3 times per week for cardiovascular health, weight loss maintenance and preservation of muscle mass.   She has agreed to Continue current level of physical activity   FOLLOW UP: Return in about 24 days (around 05/03/2023). She was informed of the importance of frequent follow up visits to maximize her success with intensive lifestyle modifications for her multiple health conditions.  Subjective:   Chief complaint: Obesity Lauren Wolfe is here to discuss her progress with her obesity treatment plan. She is keeping a food journal and adhering to recommended goals of 1050-1150 calories and 80+ protein and the Pescatarian Plan and states she is following her eating plan approximately 80% of the time. She states she is walking 30 minutes 2-3 days per week.  Interval History:  Lauren Wolfe is here for a follow up office visit.  Since last OV she states that it is hard for her to meet her calorie goals. Since June 19th she obtained her calorie goals on two occasions otherwise she has been grossly under. For example her calorie ranged from 600-800  on some days. She is not  sure if she is tracking her calories correctly. She hit her protein goal over 50% of the time.  She has hit her goal of getting out of the obesity range to overweight since June.   Review of Systems:  Pertinent positives were addressed with patient today.  Reviewed by clinician on day of visit: allergies, medications, problem list, medical history, surgical history, family history, social history, and previous encounter notes.  Weight Summary and Biometrics   Weight Lost Since Last Visit: 1lb  Weight Gained Since Last Visit: 0lb    Vitals Temp: 98.1 F (36.7 C) BP: 135/87 Pulse Rate: 75 SpO2: 97 %   Anthropometric Measurements Height: 5\' 6"  (1.676 m) Weight: 185 lb (83.9 kg) BMI (Calculated): 29.87 Weight at Last Visit: 186lb Weight Lost Since Last Visit: 1lb Weight Gained Since Last Visit: 0lb Starting Weight: 197lb Total Weight Loss (lbs): 12 lb (5.443 kg) Peak Weight: 225lb   Body Composition  Body Fat %: 36.8 % Fat Mass (lbs): 68.4 lbs Muscle Mass (lbs): 111.2 lbs Total Body Water (lbs): 79.4 lbs Visceral Fat Rating : 8   Other Clinical Data Fasting: no Labs: no Today's Visit #: 6 Starting Date: 01/16/23     Objective:  PHYSICAL EXAM: Blood pressure 135/87, pulse 75, temperature 98.1 F (36.7 C), height 5\' 6"  (1.676 m), weight 185 lb (83.9 kg), SpO2 97 %. Body mass index is 29.86 kg/m.  General: Well Developed, well nourished, and in no acute distress.  HEENT: Normocephalic, atraumatic Skin: Warm and dry, cap RF less 2 sec, good turgor Chest:  Normal excursion, shape, no gross abn Respiratory: speaking in full sentences, no conversational dyspnea NeuroM-Sk: Ambulates w/o assistance, moves * 4 Psych: A and O *3, insight good, mood-full  DIAGNOSTIC DATA REVIEWED:  BMET    Component Value Date/Time   NA 136 12/26/2022 0807   NA 137 07/30/2020 1051   K 4.2 12/26/2022 0807   CL 102 12/26/2022 0807   CO2 27 12/26/2022 0807   GLUCOSE 84  12/26/2022 0807   BUN 12 12/26/2022 0807   BUN 10 07/30/2020 1051   CREATININE 0.93 12/26/2022 0807   CALCIUM 9.6 12/26/2022 0807   GFRNONAA 67 07/30/2020 1051   GFRAA 77 07/30/2020 1051   Lab Results  Component Value Date   HGBA1C 5.7 12/26/2022   HGBA1C 5.3 02/24/2022   Lab Results  Component Value Date   INSULIN 10.5 01/16/2023   Lab Results  Component Value Date   TSH 1.33 12/26/2022   CBC    Component Value Date/Time   WBC 6.6 12/26/2022 0807   RBC 4.85 12/26/2022 0807   HGB 14.8 12/26/2022 0807   HCT 44.0 12/26/2022 0807   PLT 249.0 12/26/2022 0807   MCV 90.8 12/26/2022 0807   MCH 29.8 11/11/2019 2237   MCHC 33.7 12/26/2022 0807   RDW 13.3 12/26/2022 0807   Iron Studies    Component Value Date/Time   IRON 129 12/17/2019 0926   FERRITIN 348 (H) 10/22/2019 0222   IRONPCTSAT 43.1 12/17/2019 0926   Lipid Panel     Component Value Date/Time   CHOL 191 12/26/2022 0807   CHOL 305 (H) 08/21/2022 0958   TRIG 79.0 12/26/2022 0807   HDL 71.40 12/26/2022 0807   HDL 92 08/21/2022 0958   CHOLHDL 3 12/26/2022 0807   VLDL 15.8 12/26/2022 0807   LDLCALC 104 (H) 12/26/2022 0807   LDLCALC 200 (H) 08/21/2022 0958   LDLDIRECT 164.5 09/19/2012 1052   Hepatic Function Panel     Component Value Date/Time   PROT 7.5 12/26/2022 0807   PROT 6.9 11/02/2021 0847   ALBUMIN 4.5 12/26/2022 0807   ALBUMIN 4.4 11/02/2021 0847   AST 14 12/26/2022 0807   ALT 12 12/26/2022 0807   ALKPHOS 66 12/26/2022 0807   BILITOT 0.5 12/26/2022 0807   BILITOT 0.2 11/02/2021 0847   BILIDIR 0.1 12/26/2022 0807   BILIDIR <0.10 11/02/2021 0847      Component Value Date/Time   TSH 1.33 12/26/2022 0807   Nutritional Lab Results  Component Value Date   VD25OH 14.37 (L) 12/26/2022   VD25OH 18.32 (L) 02/24/2022   VD25OH 27.23 (L) 12/22/2020    Attestations:   I, Special Puri, acting as a Stage manager for Thomasene Lot, DO., have compiled all relevant documentation for today's office  visit on behalf of Thomasene Lot, DO, while in the presence of [employer], DO.  I have reviewed the above documentation for accuracy and completeness, and I agree with the above. [name], D.O.  The 21st Century Cures Act was signed into law in 2016 which includes the topic of electronic health records.  This provides immediate access to information in MyChart.  This includes consultation notes, operative  notes, office notes, lab results and pathology reports.  If you have any questions about what you read please let us know at your next visit so we can discuss your concerns and take corrective action if need be.  We are right here with you.

## 2023-04-10 ENCOUNTER — Encounter: Payer: Self-pay | Admitting: Internal Medicine

## 2023-04-16 ENCOUNTER — Other Ambulatory Visit (HOSPITAL_COMMUNITY): Payer: Self-pay

## 2023-04-23 ENCOUNTER — Telehealth (INDEPENDENT_AMBULATORY_CARE_PROVIDER_SITE_OTHER): Payer: Commercial Managed Care - PPO | Admitting: Psychology

## 2023-04-23 DIAGNOSIS — F5089 Other specified eating disorder: Secondary | ICD-10-CM | POA: Diagnosis not present

## 2023-04-23 NOTE — Progress Notes (Signed)
  Office: [phone]  /  Fax: [phone]    Date: April 23, 2023  Appointment Start Time: 8:01am Duration: 30 minutes Provider: Lawerance Cruel, Psy.D. Type of Session: Individual Therapy  Location of Patient: Work (private location) Location of Provider: Provider's Home (private office) Type of Contact: Telepsychological Visit via MyChart Video Visit  Session Content: Jalina is a 47 y.o. female presenting for a follow-up appointment to address the previously established treatment goal of increasing coping skills.Today's appointment was a telepsychological visit. Kayani provided verbal consent for today's telepsychological appointment and she is aware she is responsible for securing confidentiality on her end of the session. Prior to proceeding with today's appointment, Meerab's physical location at the time of this appointment was obtained as well a phone number she could be reached at in the event of technical difficulties. Cristyn and this provider participated in today's telepsychological service.   This provider conducted a brief check-in. Eloise shared about recent events, including time off of work. Reviewed eating habits. She shared an example when stress triggered emotional eating behaviors. Sheyenne was engaged in problem solving to develop a plan to help cope with urges/cravings involving activities to relax, activities to distract, comforting places, people to call and connect with, and activities that help soothe senses. She was observed noting the plan. Overall, Chevella was receptive to today's appointment as evidenced by openness to sharing, responsiveness to feedback, and willingness to implement discussed strategies .  Mental Status Examination:  Appearance: neat Behavior: appropriate to circumstances Mood: neutral Affect: mood congruent Speech: WNL Eye Contact: appropriate Psychomotor Activity: WNL Gait: unable to assess Thought Process: linear, logical,  and goal directed and no evidence or endorsement of suicidal, homicidal, and self-harm ideation, plan and intent  Thought Content/Perception: no hallucinations, delusions, bizarre thinking or behavior endorsed or observed Orientation: AAOx4 Memory/Concentration: memory, attention, language, and fund of knowledge intact  Insight: fair Judgment: fair  Interventions:  Conducted a brief chart review Provided empathic reflections and validation Reviewed content from the previous session Provided positive reinforcement Employed supportive psychotherapy interventions to facilitate reduced distress and to improve coping skills with identified stressors Engaged patient in problem solving  DSM-5 Diagnosis(es): F50.89 Other Specified Feeding or Eating Disorder, Emotional Eating Behaviors  Treatment Goal & Progress: During the initial appointment with this provider, the following treatment goal was established: increase coping skills. Mirka has demonstrated progress in her goal as evidenced by increased awareness of hunger patterns and increased awareness of triggers for emotional eating behaviors. Ziara also continues to demonstrate willingness to engage in learned skill(s).  Plan: The next appointment is scheduled for 05/21/2023 at 8am, which will be via MyChart Video Visit. The next session will focus on working towards the established treatment goal.

## 2023-05-03 ENCOUNTER — Encounter (INDEPENDENT_AMBULATORY_CARE_PROVIDER_SITE_OTHER): Payer: Self-pay | Admitting: Family Medicine

## 2023-05-03 ENCOUNTER — Ambulatory Visit (INDEPENDENT_AMBULATORY_CARE_PROVIDER_SITE_OTHER): Payer: Commercial Managed Care - PPO | Admitting: Family Medicine

## 2023-05-03 VITALS — BP 128/78 | HR 78 | Temp 98.6°F | Ht 66.0 in | Wt 180.0 lb

## 2023-05-03 DIAGNOSIS — R7303 Prediabetes: Secondary | ICD-10-CM

## 2023-05-03 DIAGNOSIS — E669 Obesity, unspecified: Secondary | ICD-10-CM

## 2023-05-03 DIAGNOSIS — E7849 Other hyperlipidemia: Secondary | ICD-10-CM | POA: Diagnosis not present

## 2023-05-03 DIAGNOSIS — E559 Vitamin D deficiency, unspecified: Secondary | ICD-10-CM

## 2023-05-03 DIAGNOSIS — Z6829 Body mass index (BMI) 29.0-29.9, adult: Secondary | ICD-10-CM

## 2023-05-03 MED ORDER — VITAMIN D (ERGOCALCIFEROL) 1.25 MG (50000 UNIT) PO CAPS
50000.0000 [IU] | ORAL_CAPSULE | ORAL | 0 refills | Status: DC
Start: 2023-05-03 — End: 2023-05-24

## 2023-05-03 NOTE — Progress Notes (Signed)
[name], D.O.  ABFM, ABOM Specializing in Clinical Bariatric Medicine  Office located at: [address] [place], [place]  [address]     Assessment and Plan:   Orders Placed This Encounter  Procedures   VITAMIN D 25 Hydroxy (Vit-D Deficiency, Fractures)   Hemoglobin A1c   Insulin, random    Medications Discontinued During This Encounter  Medication Reason   Vitamin D, Ergocalciferol, (DRISDOL) 1.25 MG (50000 UNIT) CAPS capsule Reorder     Meds ordered this encounter  Medications   Vitamin D, Ergocalciferol, (DRISDOL) 1.25 MG (50000 UNIT) CAPS capsule    Sig: Take 1 capsule (50,000 Units total) by mouth every 7 (seven) days.    Dispense:  4 capsule    Refill:  0     Prediabetes Assessment & Plan: Condition is diet/exercise controlled. Pt endorses sometimes having emotional cravings when stressed. No issues with hunger.   Lab Results  Component Value Date   HGBA1C 5.7 12/26/2022   HGBA1C 5.3 02/24/2022   INSULIN 10.5 01/16/2023    Recheck A1c and Fasting Insulin. Continue her prudent nutritional plan that is low in simple carbohydrates, saturated fats and trans fats to goal of 5-10% weight loss to achieve significant health benefits.  Pt encouraged to continually advance exercise and cardiovascular fitness as tolerated throughout weight loss journey.    Vitamin D deficiency Assessment & Plan: Condition is being treated with ERGO 50K lU once weekly and endorses tolerating prescribed supplement well.   Lab Results  Component Value Date   VD25OH 14.37 (L) 12/26/2022   VD25OH 18.32 (L) 02/24/2022   VD25OH 27.23 (L) 12/22/2020   Recheck Vitamin D today. Continue with their weight loss efforts and ERGO at current dose. Will refill ERGO today.     Other hyperlipidemia Assessment & Plan:  Pt reports starting LEQVIO [place] roughly 1 month ago per cardiology.   Lab Results  Component Value Date   CHOL 191 12/26/2022   HDL 71.40 12/26/2022   LDLCALC 104  (H) 12/26/2022   LDLDIRECT 164.5 09/19/2012   TRIG 79.0 12/26/2022   CHOLHDL 3 12/26/2022   Continue with LEQVIO [place] as prescribed by cardiology. Lauren Wolfe plans to return in 2 months with them to check labs. Continue our treatment plan of a heart-heathy, low cholesterol meal plan. Will continue to monitor condition alongside PCP/specialists as it relates to their weight loss journey    TREATMENT PLAN FOR OBESITY: BMI 29.0-29.9,adult- current BMI 29.07 Class 1 obesity with serious comorbidity and body mass index (BMI) of 31.0 to 31.9 in adult, unspecified obesity type Assessment & Plan: Lauren Wolfe is here to discuss her progress with her obesity treatment plan along with follow-up of her obesity related diagnoses. See Medical Weight Management Flowsheet for complete bioelectrical impedance results.  Condition is improving, but not optimized. Biometric data collected today, was reviewed with patient.   Since last office visit on 04/09/23 patient's  Muscle mass has decreased by 1.2 lb. Fat mass has decreased by 4.2 lb. Total body water has decreased by 4.2 lb.  Counseling done on how various foods will affect these numbers and how to maximize success  Total lbs lost to date: 17 lbs  Total weight loss percentage to date: 8.63%    Explained the Healthy Eating Plate to the pt. Reminded pt to aim to eat foods with a 10:1 ratio of calories to protein. Recommended her to try Edamame or Black Bean Spaghetti.   Continue with Zepbound 5 mg  once weekly per Dr.Johns. Pt agrees to continue with keeping a food journal of 1050-1150 calories and 80+ protein and using the Pescatarian Plan as a guide.   Behavioral Intervention Additional resources provided today:  Healthy Eating Guiding Principles Handout Evidence-based interventions for health behavior change were utilized today including the discussion of self monitoring techniques, problem-solving barriers and SMART goal setting techniques.    Regarding patient's less desirable eating habits and patterns, we employed the technique of small changes.  Pt will specifically work on: continuing to journal her intake/bringing in food log for next visit.    Recommended Physical Activity Goals Lauren Wolfe has been advised to slowly work up to 150 minutes of moderate intensity aerobic activity a week and strengthening exercises 2-3 times per week for cardiovascular health, weight loss maintenance and preservation of muscle mass. She has agreed to Continue current level of physical activity   FOLLOW UP: Return in about 3 weeks (around 05/24/2023). She was informed of the importance of frequent follow up visits to maximize her success with intensive lifestyle modifications for her multiple health conditions.  Lauren Wolfe is aware that we will review all of her lab results at our next visit.  She is aware that if anything is critical/ life threatening with the results, we will be contacting her via MyChart prior to the office visit to discuss management.    Subjective:   Chief complaint: Obesity Lauren Wolfe is here to discuss her progress with her obesity treatment plan. She is keeping a food journal and adhering to recommended goals of 1050-1150 calories and 80+ protein and the Pescatarian Plan and states she is following her eating plan approximately 75% of the time. She states she is walking 60 minutes 2 days per week.  Interval History:  Lauren Wolfe is here for a follow up office visit. Since last OV, Lauren Wolfe has been doing well. She did not bring journaling log today. In general, she reports being under in calories. On a few occasions, however, she reports being high in calories and low in protein. Endorses not eating out frequently. Sometimes has [name] [place] Sausage bowl (270 calories, 19 grams protein) for breakfast. Reports drinking roughly 128 ounces of water daily. Denies any issues with hunger. Sometimes has emotional  cravings when stressed; she is seeing Dr.Barker.  Pharmacotherapy for weight loss: She is currently taking  Zepbound 5 mg once weekly   for medical weight loss.  Denies side effects.    Review of Systems:  Pertinent positives were addressed with patient today.  Reviewed by clinician on day of visit: allergies, medications, problem list, medical history, surgical history, family history, social history, and previous encounter notes.  Weight Summary and Biometrics   Weight Lost Since Last Visit: 5lb  Weight Gained Since Last Visit: 0lb   Vitals Temp: 98.6 F (37 C) BP: 128/78 Pulse Rate: 78 SpO2: 99 %   Anthropometric Measurements Height: 5\' 6"  (1.676 m) Weight: 180 lb (81.6 kg) BMI (Calculated): 29.07 Weight at Last Visit: 185lb Weight Lost Since Last Visit: 5lb Weight Gained Since Last Visit: 0lb Starting Weight: 197lb Total Weight Loss (lbs): 17 lb (7.711 kg) Peak Weight: 225lb   Body Composition  Body Fat %: 35.7 % Fat Mass (lbs): 64.2 lbs Muscle Mass (lbs): 110 lbs Total Body Water (lbs): 75.2 lbs Visceral Fat Rating : 7   Other Clinical Data Fasting: no Labs: no Today's Visit #: 7 Starting Date: 01/16/23   Objective:   PHYSICAL EXAM: Blood pressure  128/78, pulse 78, temperature 98.6 F (37 C), height 5\' 6"  (1.676 m), weight 180 lb (81.6 kg), SpO2 99%. Body mass index is 29.05 kg/m.  General: Well Developed, well nourished, and in no acute distress.  HEENT: Normocephalic, atraumatic Skin: Warm and dry, cap RF less 2 sec, good turgor Chest:  Normal excursion, shape, no gross abn Respiratory: speaking in full sentences, no conversational dyspnea NeuroM-Sk: Ambulates w/o assistance, moves * 4 Psych: A and O *3, insight good, mood-full  DIAGNOSTIC DATA REVIEWED:  BMET    Component Value Date/Time   NA 136 12/26/2022 0807   NA 137 07/30/2020 1051   K 4.2 12/26/2022 0807   CL 102 12/26/2022 0807   CO2 27 12/26/2022 0807   GLUCOSE 84 12/26/2022  0807   BUN 12 12/26/2022 0807   BUN 10 07/30/2020 1051   CREATININE 0.93 12/26/2022 0807   CALCIUM 9.6 12/26/2022 0807   GFRNONAA 67 07/30/2020 1051   GFRAA 77 07/30/2020 1051   Lab Results  Component Value Date   HGBA1C 5.7 12/26/2022   HGBA1C 5.3 02/24/2022   Lab Results  Component Value Date   INSULIN 10.5 01/16/2023   Lab Results  Component Value Date   TSH 1.33 12/26/2022   CBC    Component Value Date/Time   WBC 6.6 12/26/2022 0807   RBC 4.85 12/26/2022 0807   HGB 14.8 12/26/2022 0807   HCT 44.0 12/26/2022 0807   PLT 249.0 12/26/2022 0807   MCV 90.8 12/26/2022 0807   MCH 29.8 11/11/2019 2237   MCHC 33.7 12/26/2022 0807   RDW 13.3 12/26/2022 0807   Iron Studies    Component Value Date/Time   IRON 129 12/17/2019 0926   FERRITIN 348 (H) 10/22/2019 0222   IRONPCTSAT 43.1 12/17/2019 0926   Lipid Panel     Component Value Date/Time   CHOL 191 12/26/2022 0807   CHOL 305 (H) 08/21/2022 0958   TRIG 79.0 12/26/2022 0807   HDL 71.40 12/26/2022 0807   HDL 92 08/21/2022 0958   CHOLHDL 3 12/26/2022 0807   VLDL 15.8 12/26/2022 0807   LDLCALC 104 (H) 12/26/2022 0807   LDLCALC 200 (H) 08/21/2022 0958   LDLDIRECT 164.5 09/19/2012 1052   Hepatic Function Panel     Component Value Date/Time   PROT 7.5 12/26/2022 0807   PROT 6.9 11/02/2021 0847   ALBUMIN 4.5 12/26/2022 0807   ALBUMIN 4.4 11/02/2021 0847   AST 14 12/26/2022 0807   ALT 12 12/26/2022 0807   ALKPHOS 66 12/26/2022 0807   BILITOT 0.5 12/26/2022 0807   BILITOT 0.2 11/02/2021 0847   BILIDIR 0.1 12/26/2022 0807   BILIDIR <0.10 11/02/2021 0847      Component Value Date/Time   TSH 1.33 12/26/2022 0807   Nutritional Lab Results  Component Value Date   VD25OH 14.37 (L) 12/26/2022   VD25OH 18.32 (L) 02/24/2022   VD25OH 27.23 (L) 12/22/2020    Attestations:   I, Special Puri , acting as a Stage manager for Thomasene Lot, DO., have compiled all relevant documentation for today's office visit on  behalf of Thomasene Lot, DO, while in the presence of [employer], DO.  I have reviewed the above documentation for accuracy and completeness, and I agree with the above. [name], D.O.  The 21st Century Cures Act was signed into law in 2016 which includes the topic of electronic health records.  This provides immediate access to information in MyChart.  This includes consultation notes, operative notes, office notes, lab  results and pathology reports.  If you have any questions about what you read please let us know at your next visit so we can discuss your concerns and take corrective action if need be.  We are right here with you.

## 2023-05-08 ENCOUNTER — Other Ambulatory Visit (HOSPITAL_COMMUNITY): Payer: Self-pay

## 2023-05-15 ENCOUNTER — Ambulatory Visit
Admission: RE | Admit: 2023-05-15 | Discharge: 2023-05-15 | Disposition: A | Discharge status: Home / Self Care | Payer: Commercial Managed Care - PPO | Source: Ambulatory Visit | Attending: Obstetrics and Gynecology | Admitting: Obstetrics and Gynecology

## 2023-05-15 ENCOUNTER — Ambulatory Visit: Payer: Commercial Managed Care - PPO

## 2023-05-15 ENCOUNTER — Other Ambulatory Visit (HOSPITAL_COMMUNITY): Payer: Self-pay

## 2023-05-15 DIAGNOSIS — Z1231 Encounter for screening mammogram for malignant neoplasm of breast: Secondary | ICD-10-CM

## 2023-05-21 ENCOUNTER — Telehealth (INDEPENDENT_AMBULATORY_CARE_PROVIDER_SITE_OTHER): Payer: Commercial Managed Care - PPO | Admitting: Psychology

## 2023-05-21 ENCOUNTER — Other Ambulatory Visit (HOSPITAL_COMMUNITY): Payer: Self-pay

## 2023-05-21 DIAGNOSIS — F5089 Other specified eating disorder: Secondary | ICD-10-CM | POA: Diagnosis not present

## 2023-05-21 NOTE — Progress Notes (Signed)
  Office: [phone]  /  Fax: [phone]    Date: May 21, 2023  Appointment Start Time: 8:01am Duration: 21 minutes Provider: Lawerance Cruel, Psy.D. Type of Session: Individual Therapy  Location of Patient: Home (private location) Location of Provider: Provider's Home (private office) Type of Contact: Telepsychological Visit via MyChart Video Visit  Session Content: Lauren Wolfe is a 47 y.o. female presenting for a follow-up appointment to address the previously established treatment goal of increasing coping skills.Today's appointment was a telepsychological visit. Lauren Wolfe provided verbal consent for today's telepsychological appointment and she is aware she is responsible for securing confidentiality on her end of the session. Prior to proceeding with today's appointment, Lauren Wolfe's physical location at the time of this appointment was obtained as well a phone number she could be reached at in the event of technical difficulties. Lauren Wolfe and this provider participated in today's telepsychological service.   This provider conducted a brief check-in. Lauren Wolfe discussed it will be a busy week due to school starting FT and a move in addition to her two employment positions. She added her eating habits "have been good the last couple weeks." Further explored and processed. Notably, she described a reduction in emotional eating behaviors. Psychoeducation regarding mindfulness was provided. A handout was provided to Lauren Wolfe with further information regarding mindfulness, including exercises. This provider also explained the benefit of mindfulness as it relates to emotional eating. Lauren Wolfe was encouraged to engage in the provided exercises between now and the next appointment with this provider. Lauren Wolfe agreed. During today's appointment, Lauren Wolfe was led through a mindfulness exercise involving her senses. Lauren Wolfe provided verbal consent during today's appointment for this provider to  send a handout about mindfulness via e-mail. Overall, Lauren Wolfe was receptive to today's appointment as evidenced by openness to sharing, responsiveness to feedback, and willingness to continue engaging in mindfulness exercises.  Mental Status Examination:  Appearance: neat Behavior: appropriate to circumstances Mood: neutral Affect: mood congruent Speech: WNL Eye Contact: appropriate Psychomotor Activity: WNL Gait: unable to assess Thought Process: linear, logical, and goal directed and no evidence or endorsement of suicidal, homicidal, and self-harm ideation, plan and intent  Thought Content/Perception: no hallucinations, delusions, bizarre thinking or behavior endorsed or observed Orientation: AAOx4 Memory/Concentration: intact Insight: fair Judgment: fair  Interventions:  Conducted a brief chart review Provided empathic reflections and validation Reviewed content from the previous session Provided positive reinforcement Employed supportive psychotherapy interventions to facilitate reduced distress and to improve coping skills with identified stressors Psychoeducation provided regarding mindfulness Engaged patient in mindfulness exercise(s)  DSM-5 Diagnosis(es): F50.89 Other Specified Feeding or Eating Disorder, Emotional Eating Behaviors  Treatment Goal & Progress: During the initial appointment with this provider, the following treatment goal was established: increase coping skills. Lauren Wolfe has demonstrated progress in her goal as evidenced by increased awareness of hunger patterns, increased awareness of triggers for emotional eating behaviors, and reduction in emotional eating behaviors . Lauren Wolfe also continues to demonstrate willingness to engage in learned skill(s).  Plan: The next appointment is scheduled for 06/18/2023 at 8am, which will be via MyChart Video Visit. The next session will focus on working towards the established treatment goal.

## 2023-05-24 ENCOUNTER — Ambulatory Visit (INDEPENDENT_AMBULATORY_CARE_PROVIDER_SITE_OTHER): Payer: Commercial Managed Care - PPO | Admitting: Family Medicine

## 2023-05-24 ENCOUNTER — Encounter (INDEPENDENT_AMBULATORY_CARE_PROVIDER_SITE_OTHER): Payer: Self-pay | Admitting: Family Medicine

## 2023-05-24 VITALS — BP 120/84 | HR 77 | Temp 97.8°F | Ht 66.0 in | Wt 175.0 lb

## 2023-05-24 DIAGNOSIS — Z6828 Body mass index (BMI) 28.0-28.9, adult: Secondary | ICD-10-CM | POA: Diagnosis not present

## 2023-05-24 DIAGNOSIS — E559 Vitamin D deficiency, unspecified: Secondary | ICD-10-CM | POA: Diagnosis not present

## 2023-05-24 DIAGNOSIS — E669 Obesity, unspecified: Secondary | ICD-10-CM

## 2023-05-24 DIAGNOSIS — R7303 Prediabetes: Secondary | ICD-10-CM | POA: Diagnosis not present

## 2023-05-24 MED ORDER — VITAMIN D (ERGOCALCIFEROL) 1.25 MG (50000 UNIT) PO CAPS: 50000.0000 [IU] | ORAL_CAPSULE | ORAL | 0 refills | Status: DC

## 2023-05-24 NOTE — Progress Notes (Signed)
Lauren Wolfe, D.O.  ABFM, ABOM Specializing in Clinical Bariatric Medicine  Office located at: [address] [place], [place]  [address]     Assessment and Plan:   Medications Discontinued During This Encounter  Medication Reason   Vitamin D, Ergocalciferol, (DRISDOL) 1.25 MG (50000 UNIT) CAPS capsule Reorder     Meds ordered this encounter  Medications   Vitamin D, Ergocalciferol, (DRISDOL) 1.25 MG (50000 UNIT) CAPS capsule    Sig: Take 1 capsule (50,000 Units total) by mouth every 7 (seven) days.    Dispense:  4 capsule    Refill:  0    Prediabetes Assessment & Plan: No current meds. Diet/exercise approach. A1c levels have improved from 5.7 to 5.3. Insulin levels slightly improved from 10.5 to 10.3. Liver enzymes are within normal limits. Sodium levels slightly up, all other electrolyte levels are normal.  Lab Results  Component Value Date   HGBA1C 5.3 05/03/2023   HGBA1C 5.7 12/26/2022   HGBA1C 5.3 02/24/2022   INSULIN 10.3 05/03/2023   INSULIN 10.5 01/16/2023   Lab Results  Component Value Date   CREATININE 0.82 05/03/2023   BUN 14 05/03/2023   NA 141 05/03/2023   K 4.2 05/03/2023   CL 105 05/03/2023   CO2 23 05/03/2023      Component Value Date/Time   PROT 6.7 05/03/2023 0840   ALBUMIN 4.5 05/03/2023 0840   AST 13 05/03/2023 0840   ALT 15 05/03/2023 0840   ALKPHOS 74 05/03/2023 0840   BILITOT 0.3 05/03/2023 0840   BILIDIR 0.1 12/26/2022 0807   BILIDIR <0.10 11/02/2021 0847    Unless pre-existing renal or cardiopulmonary conditions exist which patient was told to limit their fluid intake by another provider, I recommended roughly one half of their weight in pounds, to be the approximate ounces of non-caloric, non-caffeinated beverages they should drink per day; including more if they are engaging in exercise.   Continue to decrease simple carbs/ sugars; increase fiber and proteins -> follow her meal plan.  Will continue to monitor condition  alongside PCP/specialists as it relates to their weight loss journey    Vitamin D deficiency Assessment & Plan: Condition treated with ERGO 50,000 units once wkly; pt is responding to supplement well, levels have improved from 14.37 to 48.7    Lab Results  Component Value Date   VD25OH 48.7 05/03/2023   VD25OH 14.37 (L) 12/26/2022   VD25OH 18.32 (L) 02/24/2022   Continue with their weight loss efforts and ERGO at current dose - Will refill ERGO today. Will recheck levels in 3-4 mos.    TREATMENT PLAN FOR OBESITY: Class 1 obesity with serious comorbidity and body mass index (BMI) of 31.0 to 31.9 in adult, unspecified obesity type Assessment & Plan: Lauren Wolfe is here to discuss her progress with her obesity treatment plan along with follow-up of her obesity related diagnoses. See Medical Weight Management Flowsheet for complete bioelectrical impedance results.  Since last office visit on 05/03/23 patient's  Muscle mass has decreased by 1 lb. Fat mass has decreased by 3.8 lb. Total body water has decreased by 1.6 lb.  Counseling done on how various foods will affect these numbers and how to maximize success  Total lbs lost to date: 22 lbs  Total weight loss percentage to date: 11.17%   Continue with Zepbound 5 mg once weekly per Dr.Johns.   No change to meal plan - see Subjective   Behavioral Intervention Additional resources provided today: patient  declined Evidence-based interventions for health behavior change were utilized today including the discussion of self monitoring techniques, problem-solving barriers and SMART goal setting techniques.   Regarding patient's less desirable eating habits and patterns, we employed the technique of small changes.  Pt will specifically work on: drinking at least 90 ounces of water daily and continuing to journal her intake for next visit.    FOLLOW UP: Return in about 3 weeks (around 06/14/2023). She was informed of the importance of  frequent follow up visits to maximize her success with intensive lifestyle modifications for her multiple health conditions.  Subjective:   Chief complaint: Obesity Lauren Wolfe is here to discuss her progress with her obesity treatment plan. She is keeping a food journal and adhering to recommended goals of 1050-1150 calories and 80+ protein and the Pescatarian Plan and states she is following her eating plan approximately 80% of the time. She states she is walking 30 minutes, 2 days per week and doing weights 20 minutes, 3 days per week.   Interval History:  Lauren Wolfe is here for a follow up office visit. Since last OV, Lauren Wolfe has been doing well. Reports starting school 2 days ago. Has been journaling her intake and endorses that she is adhering closer to her calorie goals. Reports having no issue with her protein intake - sometimes she has 100+ grams protein. Her protein sources are tuna, [place], salmon, or shrimp. No complaints of hunger and cravings.   Pharmacotherapy for weight loss: She is currently taking  Zepbound 5 mg once wkly  for medical weight loss.  Denies side effects.    Review of Systems:  Pertinent positives were addressed with patient today.  Reviewed by clinician on day of visit: allergies, medications, problem list, medical history, surgical history, family history, social history, and previous encounter notes.  Weight Summary and Biometrics   Weight Lost Since Last Visit: 5lb  Weight Gained Since Last Visit: 0lb   Vitals Temp: 97.8 F (36.6 C) BP: 120/84 Pulse Rate: 77 SpO2: 98 %   Anthropometric Measurements Height: 5\' 6"  (1.676 m) Weight: 175 lb (79.4 kg) BMI (Calculated): 28.26 Weight at Last Visit: 180lb Weight Lost Since Last Visit: 5lb Weight Gained Since Last Visit: 0lb Starting Weight: 197lb Total Weight Loss (lbs): 22 lb (9.979 kg) Peak Weight: 225lb   Body Composition  Body Fat %: 34.5 % Fat Mass (lbs): 60.4 lbs Muscle Mass  (lbs): 109 lbs Total Body Water (lbs): 73.6 lbs Visceral Fat Rating : 7   Other Clinical Data Fasting: no Labs: no Today's Visit #: 8 Starting Date: 01/16/23    Objective:   PHYSICAL EXAM: Blood pressure 120/84, pulse 77, temperature 97.8 F (36.6 C), height 5\' 6"  (1.676 m), weight 175 lb (79.4 kg), SpO2 98%. Body mass index is 28.25 kg/m.  General: Well Developed, well nourished, and in no acute distress.  HEENT: Normocephalic, atraumatic Skin: Warm and dry, cap RF less 2 sec, good turgor Chest:  Normal excursion, shape, no gross abn Respiratory: speaking in full sentences, no conversational dyspnea NeuroM-Sk: Ambulates w/o assistance, moves * 4 Psych: A and O *3, insight good, mood-full  DIAGNOSTIC DATA REVIEWED:  BMET    Component Value Date/Time   NA 141 05/03/2023 0840   K 4.2 05/03/2023 0840   CL 105 05/03/2023 0840   CO2 23 05/03/2023 0840   GLUCOSE 82 05/03/2023 0840   GLUCOSE 84 12/26/2022 0807   BUN 14 05/03/2023 0840   CREATININE 0.82 05/03/2023 0840  CALCIUM 9.2 05/03/2023 0840   GFRNONAA 67 07/30/2020 1051   GFRAA 77 07/30/2020 1051   Lab Results  Component Value Date   HGBA1C 5.3 05/03/2023   HGBA1C 5.3 02/24/2022   Lab Results  Component Value Date   INSULIN 10.3 05/03/2023   INSULIN 10.5 01/16/2023   Lab Results  Component Value Date   TSH 1.33 12/26/2022   CBC    Component Value Date/Time   WBC 6.6 12/26/2022 0807   RBC 4.85 12/26/2022 0807   HGB 14.8 12/26/2022 0807   HCT 44.0 12/26/2022 0807   PLT 249.0 12/26/2022 0807   MCV 90.8 12/26/2022 0807   MCH 29.8 11/11/2019 2237   MCHC 33.7 12/26/2022 0807   RDW 13.3 12/26/2022 0807   Iron Studies    Component Value Date/Time   IRON 129 12/17/2019 0926   FERRITIN 348 (H) 10/22/2019 0222   IRONPCTSAT 43.1 12/17/2019 0926   Lipid Panel     Component Value Date/Time   CHOL 191 12/26/2022 0807   CHOL 305 (H) 08/21/2022 0958   TRIG 79.0 12/26/2022 0807   HDL 71.40  12/26/2022 0807   HDL 92 08/21/2022 0958   CHOLHDL 3 12/26/2022 0807   VLDL 15.8 12/26/2022 0807   LDLCALC 104 (H) 12/26/2022 0807   LDLCALC 200 (H) 08/21/2022 0958   LDLDIRECT 164.5 09/19/2012 1052   Hepatic Function Panel     Component Value Date/Time   PROT 6.7 05/03/2023 0840   ALBUMIN 4.5 05/03/2023 0840   AST 13 05/03/2023 0840   ALT 15 05/03/2023 0840   ALKPHOS 74 05/03/2023 0840   BILITOT 0.3 05/03/2023 0840   BILIDIR 0.1 12/26/2022 0807   BILIDIR <0.10 11/02/2021 0847      Component Value Date/Time   TSH 1.33 12/26/2022 0807   Nutritional Lab Results  Component Value Date   VD25OH 48.7 05/03/2023   VD25OH 14.37 (L) 12/26/2022   VD25OH 18.32 (L) 02/24/2022    Attestations:   I, Special Puri, acting as a Stage manager for Thomasene Lot, DO., have compiled all relevant documentation for today's office visit on behalf of Thomasene Lot, DO, while in the presence of [employer], DO.  I have reviewed the above documentation for accuracy and completeness, and I agree with the above. Lauren Wolfe, D.O.  The 21st Century Cures Act was signed into law in 2016 which includes the topic of electronic health records.  This provides immediate access to information in MyChart.  This includes consultation notes, operative notes, office notes, lab results and pathology reports.  If you have any questions about what you read please let us know at your next visit so we can discuss your concerns and take corrective action if need be.  We are right here with you.

## 2023-05-31 ENCOUNTER — Ambulatory Visit (INDEPENDENT_AMBULATORY_CARE_PROVIDER_SITE_OTHER): Payer: Commercial Managed Care - PPO | Admitting: Neurology

## 2023-05-31 VITALS — BP 147/99

## 2023-05-31 DIAGNOSIS — G43811 Other migraine, intractable, with status migrainosus: Secondary | ICD-10-CM | POA: Diagnosis not present

## 2023-05-31 DIAGNOSIS — G43709 Chronic migraine without aura, not intractable, without status migrainosus: Secondary | ICD-10-CM

## 2023-05-31 MED ORDER — ONABOTULINUMTOXINA 200 UNITS IJ SOLR
155.0000 [IU] | Freq: Once | INTRAMUSCULAR | Status: AC
Start: 2023-05-31 — End: 2023-05-31
  Administered 2023-05-31: 155 [IU] via INTRAMUSCULAR

## 2023-05-31 NOTE — Progress Notes (Signed)
Botox- 200 units x 1 vial Lot: [id number] Expiration: 12.2026 NDC: 0023-3921-02  Bacteriostatic 0.9% Sodium Chloride- 4 mL  Lot: [id number] Expiration: 04.01.2025 NDC: 1478295621  Dx: H08.657. S/P  Witnessed by Gann, CMA

## 2023-05-31 NOTE — Progress Notes (Signed)
   BOTOX PROCEDURE NOTE FOR MIGRAINE HEADACHE  HISTORY: Here for Botox. This is her 5th injection. Last was 02/28/23 with me.  Has done very well with Botox.  On average 3-5 migraines a month.  Nurtec works well will combine with Zofran.  Usually takes Nurtec 1-3 times a week for milder headaches.  Had less wearing off of Botox this time.  Has not needed any Imitrex.  Description of procedure:  The patient was placed in a sitting position. The standard protocol was used for Botox as follows, with 5 units of Botox injected at each site:  -Procerus muscle, midline injection  -Corrugator muscle, bilateral injection  -Frontalis muscle, bilateral injection, with 2 sites each side, medial injection was performed in the upper one third of the frontalis muscle, in the region vertical from the medial inferior edge of the superior orbital rim. The lateral injection was again in the upper one third of the forehead vertically above the lateral limbus of the cornea, 1.5 cm lateral to the medial injection site.  -Temporalis muscle injection, 4 sites, bilaterally. The first injection was 3 cm above the tragus of the ear, second injection site was 1.5 cm to 3 cm up from the first injection site in line with the tragus of the ear. The third injection site was 1.5-3 cm forward between the first 2 injection sites. The fourth injection site was 1.5 cm posterior to the second injection site.  -Occipitalis muscle injection, 3 sites, bilaterally. The first injection was done one half way between the occipital protuberance and the tip of the mastoid process behind the ear. The second injection site was done lateral and superior to the first, 1 fingerbreadth from the first injection. The third injection site was 1 fingerbreadth superiorly and medially from the first injection site.  -Cervical paraspinal muscle injection, 2 sites, bilateral, the first injection site was 1 cm from the midline of the cervical spine, 3 cm  inferior to the lower border of the occipital protuberance. The second injection site was 1.5 cm superiorly and laterally to the first injection site.  -Trapezius muscle injection was performed at 3 sites, bilaterally. The first injection site was in the upper trapezius muscle halfway between the inflection point of the neck, and the acromion. The second injection site was one half way between the acromion and the first injection site. The third injection was done between the first injection site and the inflection point of the neck.  A 200 unit bottle of Botox was used, 155 units were injected, the rest of the Botox was wasted. The patient tolerated the procedure well, there were no complications of the above procedure.  Botox NDC 4098-1191-47 Lot number [id number] Expiration date 09/01/2025 SP

## 2023-06-14 ENCOUNTER — Encounter (INDEPENDENT_AMBULATORY_CARE_PROVIDER_SITE_OTHER): Payer: Self-pay

## 2023-06-14 ENCOUNTER — Ambulatory Visit (INDEPENDENT_AMBULATORY_CARE_PROVIDER_SITE_OTHER): Payer: Commercial Managed Care - PPO | Admitting: Family Medicine

## 2023-06-18 ENCOUNTER — Telehealth (INDEPENDENT_AMBULATORY_CARE_PROVIDER_SITE_OTHER): Payer: Commercial Managed Care - PPO | Admitting: Psychology

## 2023-06-18 DIAGNOSIS — F5089 Other specified eating disorder: Secondary | ICD-10-CM

## 2023-06-18 NOTE — Progress Notes (Signed)
  Office: [phone]  /  Fax: [phone]    Date: June 18, 2023  Appointment Start Time: 8:00am Duration: 28 minutes Provider: Lawerance Cruel, Psy.D. Type of Session: Individual Therapy  Location of Patient: Work (private location) Location of Provider: Provider's Home (private office) Type of Contact: Telepsychological Visit via MyChart Video Visit  Session Content: Lauren Wolfe is a 47 y.o. female presenting for a follow-up appointment to address the previously established treatment goal of increasing coping skills.Today's appointment was a telepsychological visit. Lauren Wolfe provided verbal consent for today's telepsychological appointment and she is aware she is responsible for securing confidentiality on her end of the session. Prior to proceeding with today's appointment, Lauren Wolfe's physical location at the time of this appointment was obtained as well a phone number she could be reached at in the event of technical difficulties. Lauren Wolfe and this provider participated in today's telepsychological service.   This provider conducted a brief check-in. Lauren Wolfe reported she is "busy" due to work and school. Regarding her eating habits, she disclosed she is "not eating as much." Further explored and processed. She was engaged in problem solving and she agreed to implement the following: have pre-made breakfast available on work days; double recipes when possible; and bring snacks/meals for the work week. Overall, Lauren Wolfe was receptive to today's appointment as evidenced by openness to sharing, responsiveness to feedback, and willingness to implement discussed strategies .  Mental Status Examination:  Appearance: neat Behavior: appropriate to circumstances Mood: neutral Affect: mood congruent Speech: WNL Eye Contact: appropriate Psychomotor Activity: WNL Gait: unable to assess Thought Process: linear, logical, and goal directed and no evidence or endorsement of suicidal,  homicidal, and self-harm ideation, plan and intent  Thought Content/Perception: no hallucinations, delusions, bizarre thinking or behavior endorsed or observed Orientation: AAOx4 Memory/Concentration: intact Insight: fair Judgment: fair  Interventions:  Conducted a brief chart review Provided empathic reflections and validation Provided positive reinforcement Employed supportive psychotherapy interventions to facilitate reduced distress and to improve coping skills with identified stressors Engaged patient in problem solving  DSM-5 Diagnosis(es): F50.89 Other Specified Feeding or Eating Disorder, Emotional Eating Behaviors  Treatment Goal & Progress: During the initial appointment with this provider, the following treatment goal was established: increase coping skills. Lauren Wolfe has demonstrated progress in her goal as evidenced by increased awareness of hunger patterns, increased awareness of triggers for emotional eating behaviors, and reduction in emotional eating behaviors . Lauren Wolfe also continues to demonstrate willingness to engage in learned skill(s).  Plan: The next appointment is scheduled for 07/10/2023 at 10am, which will be via MyChart Video Visit. The next session will focus on working towards the established treatment goal.

## 2023-06-29 ENCOUNTER — Other Ambulatory Visit (HOSPITAL_COMMUNITY): Payer: Self-pay

## 2023-07-03 ENCOUNTER — Other Ambulatory Visit: Payer: Self-pay

## 2023-07-06 ENCOUNTER — Ambulatory Visit: Payer: Commercial Managed Care - PPO

## 2023-07-06 VITALS — BP 129/85 | HR 82 | Temp 98.0°F | Resp 18 | Ht 66.0 in | Wt 177.0 lb

## 2023-07-06 DIAGNOSIS — E7800 Pure hypercholesterolemia, unspecified: Secondary | ICD-10-CM

## 2023-07-06 MED ORDER — INCLISIRAN SODIUM 284 MG/1.5ML ~~LOC~~ SOSY
284.0000 mg | PREFILLED_SYRINGE | Freq: Once | SUBCUTANEOUS | Status: AC
  Administered 2023-07-06: 284 mg via SUBCUTANEOUS
  Filled 2023-07-06: qty 1.5

## 2023-07-06 NOTE — Progress Notes (Signed)
Diagnosis: Hyperlipidemia  Provider:  Chilton Greathouse MD  Procedure: Injection  Leqvio (inclisiran), Dose: 284 mg, Site: subcutaneous, Number of injections: 1  Post Care:  Left arm injection  Discharge: Condition: Good, Destination: Home . AVS Declined  Performed by:  Adriana Mccallum, RN

## 2023-07-10 ENCOUNTER — Telehealth (INDEPENDENT_AMBULATORY_CARE_PROVIDER_SITE_OTHER): Payer: Commercial Managed Care - PPO | Admitting: Psychology

## 2023-07-10 ENCOUNTER — Ambulatory Visit (INDEPENDENT_AMBULATORY_CARE_PROVIDER_SITE_OTHER): Payer: Commercial Managed Care - PPO | Admitting: Family Medicine

## 2023-07-16 ENCOUNTER — Ambulatory Visit (INDEPENDENT_AMBULATORY_CARE_PROVIDER_SITE_OTHER): Payer: Commercial Managed Care - PPO | Admitting: Family Medicine

## 2023-07-16 ENCOUNTER — Encounter (INDEPENDENT_AMBULATORY_CARE_PROVIDER_SITE_OTHER): Payer: Self-pay | Admitting: Family Medicine

## 2023-07-16 VITALS — BP 135/85 | HR 75 | Temp 97.9°F | Ht 66.0 in | Wt 170.0 lb

## 2023-07-16 DIAGNOSIS — E66811 Obesity, class 1: Secondary | ICD-10-CM | POA: Diagnosis not present

## 2023-07-16 DIAGNOSIS — Z6827 Body mass index (BMI) 27.0-27.9, adult: Secondary | ICD-10-CM

## 2023-07-16 DIAGNOSIS — E559 Vitamin D deficiency, unspecified: Secondary | ICD-10-CM

## 2023-07-16 DIAGNOSIS — R7303 Prediabetes: Secondary | ICD-10-CM | POA: Diagnosis not present

## 2023-07-16 DIAGNOSIS — Z6829 Body mass index (BMI) 29.0-29.9, adult: Secondary | ICD-10-CM

## 2023-07-16 MED ORDER — VITAMIN D (ERGOCALCIFEROL) 1.25 MG (50000 UNIT) PO CAPS: 50000.0000 [IU] | ORAL_CAPSULE | ORAL | 0 refills | Status: DC

## 2023-07-16 NOTE — Progress Notes (Signed)
[name], D.O.  ABFM, ABOM Specializing in Clinical Bariatric Medicine  Office located at: [address] [place], [place]  [address]     Assessment and Plan:   Medications Discontinued During This Encounter  Medication Reason   Vitamin D, Ergocalciferol, (DRISDOL) 1.25 MG (50000 UNIT) CAPS capsule Reorder    Meds ordered this encounter  Medications   Vitamin D, Ergocalciferol, (DRISDOL) 1.25 MG (50000 UNIT) CAPS capsule    Sig: Take 1 capsule (50,000 Units total) by mouth every 7 (seven) days.    Dispense:  4 capsule    Refill:  0    Vitamin D deficiency Assessment: Condition is Not at goal.. Pt endorses taking ERGO consistently and tolerating this well. She denies any adverse side effects on this.  Lab Results  Component Value Date   VD25OH 48.7 05/03/2023   VD25OH 14.37 (L) 12/26/2022   VD25OH 18.32 (L) 02/24/2022   Plan: - Continue with Ergocalciferol 50K IU weekly and she was encouraged to continue to take the medicine until told otherwise.   I will refill today.   - weight loss will likely improve availability of vitamin D, thus encouraged Imelda to continue with meal plan and their weight loss efforts to further improve this condition.  Thus, we will need to monitor levels regularly (every 3-4 mo on average) to keep levels within normal limits and prevent over supplementation.   Prediabetes Assessment: Condition is Not at goal.. This is diet/exercise controlled. She endorses her hunger and cravings being more well controlled since following the meal plan more.  Lab Results  Component Value Date   HGBA1C 5.3 05/03/2023   HGBA1C 5.7 12/26/2022   HGBA1C 5.3 02/24/2022   INSULIN 10.3 05/03/2023   INSULIN 10.5 01/16/2023    Plan: - Continue her prudent nutritional plan that is low in simple carbohydrates, saturated fats and trans fats to goal of 5-10% weight loss to achieve significant health benefits.  Pt encouraged to continually advance exercise and  cardiovascular fitness as tolerated throughout weight loss journey.   - Pt endorses not wanting to start any weight loss or GLP1 medications.   - Educational handout was provided.   - We will recheck A1c and fasting insulin level in approximately 3 months from last check, or as deemed appropriate.    TREATMENT PLAN FOR OBESITY: Class 1 obesity with serious comorbidity and body mass index (BMI) of 31.0 to 31.9 in adult, unspecified obesity type BMI 29.0-29.9,adult- current BMI 27.45 Assessment:  Juanell S Siwek is here to discuss her progress with her obesity treatment plan along with follow-up of her obesity related diagnoses. See Medical Weight Management Flowsheet for complete bioelectrical impedance results.  Condition is not optimized. Biometric data collected today, was reviewed with patient.   Since last office visit on 05/24/2023 patient's  Muscle mass has decreased by 3.4lb. Fat mass has decreased by 0.8lb. Total body water has increased by 0.6lb.  Counseling done on how various foods will affect these numbers and how to maximize success  Total lbs lost to date: 27 Total weight loss percentage to date: 13.71%   Plan: - Tevis will work on healthier eating habits and try to follow the keeping a food journal and adhering to recommended goals of 1050-1150 calories and 80+ protein and the Pescatarian Plan best they can.   Behavioral Intervention Additional resources provided today:  GLP1 information Evidence-based interventions for health behavior change were utilized today including the discussion of self monitoring techniques, problem-solving barriers  and SMART goal setting techniques.   Regarding patient's less desirable eating habits and patterns, we employed the technique of small changes.  Pt will specifically work on: journal her intake, get in 80+g of protein, and exercise for next visit.     She has agreed to Increase the intensity, frequency or duration of aerobic  exercises     FOLLOW UP: Return in about 4 weeks (around 08/13/2023).  She was informed of the importance of frequent follow up visits to maximize her success with intensive lifestyle modifications for her multiple health conditions.  Subjective:   Chief complaint: Obesity Deyana is here to discuss her progress with her obesity treatment plan. She is on the keeping a food journal and adhering to recommended goals of 1050-1150 calories and 80+ protein and the Pescatarian Plan and states she is following her eating plan approximately 75% of the time. She states she is walking 30-35 minutes 2 days per week.  Interval History:  Azyiah S Sautter is here for a follow up office visit.     Since last office visit:  Pt has been well since last OV. She endorses eating out less and increasing her calorie and protein intake. She notes cooking more and buying more seafood. She drinks about 4 32oz cups a day. She informed me that she hasn't been journaling consistently. She notes being under some stress due to her significant others health conditions and her daughter being close to her due date.    We reviewed her meal plan and all questions were answered.   Review of Systems:  Pertinent positives were addressed with patient today.  Reviewed by clinician on day of visit: allergies, medications, problem list, medical history, surgical history, family history, social history, and previous encounter notes.  Weight Summary and Biometrics   Weight Lost Since Last Visit: 5lb  Weight Gained Since Last Visit: 0lb   Vitals Temp: 97.9 F (36.6 C) BP: 135/85 Pulse Rate: 75 SpO2: 100 %   Anthropometric Measurements Height: 5\' 6"  (1.676 m) Weight: 170 lb (77.1 kg) BMI (Calculated): 27.45 Weight at Last Visit: 180lb Weight Lost Since Last Visit: 5lb Weight Gained Since Last Visit: 0lb Starting Weight: 197lb Total Weight Loss (lbs): 27 lb (12.2 kg) Peak Weight: 225lb   Body Composition   Body Fat %: 34.9 % Fat Mass (lbs): 59.6 lbs Muscle Mass (lbs): 105.6 lbs Total Body Water (lbs): 74.2 lbs Visceral Fat Rating : 7   Other Clinical Data Fasting: no Labs: no Today's Visit #: 9 Starting Date: 01/16/23     Objective:   PHYSICAL EXAM: Blood pressure 135/85, pulse 75, temperature 97.9 F (36.6 C), height 5\' 6"  (1.676 m), weight 170 lb (77.1 kg), SpO2 100%. Body mass index is 27.44 kg/m.  General: Well Developed, well nourished, and in no acute distress.  HEENT: Normocephalic, atraumatic Skin: Warm and dry, cap RF less 2 sec, good turgor Chest:  Normal excursion, shape, no gross abn Respiratory: speaking in full sentences, no conversational dyspnea NeuroM-Sk: Ambulates w/o assistance, moves * 4 Psych: A and O *3, insight good, mood-full  DIAGNOSTIC DATA REVIEWED:  BMET    Component Value Date/Time   NA 141 05/03/2023 0840   K 4.2 05/03/2023 0840   CL 105 05/03/2023 0840   CO2 23 05/03/2023 0840   GLUCOSE 82 05/03/2023 0840   GLUCOSE 84 12/26/2022 0807   BUN 14 05/03/2023 0840   CREATININE 0.82 05/03/2023 0840   CALCIUM 9.2 05/03/2023 0840   GFRNONAA 67 07/30/2020  1051   GFRAA 77 07/30/2020 1051   Lab Results  Component Value Date   HGBA1C 5.3 05/03/2023   HGBA1C 5.3 02/24/2022   Lab Results  Component Value Date   INSULIN 10.3 05/03/2023   INSULIN 10.5 01/16/2023   Lab Results  Component Value Date   TSH 1.33 12/26/2022   CBC    Component Value Date/Time   WBC 6.6 12/26/2022 0807   RBC 4.85 12/26/2022 0807   HGB 14.8 12/26/2022 0807   HCT 44.0 12/26/2022 0807   PLT 249.0 12/26/2022 0807   MCV 90.8 12/26/2022 0807   MCH 29.8 11/11/2019 2237   MCHC 33.7 12/26/2022 0807   RDW 13.3 12/26/2022 0807   Iron Studies    Component Value Date/Time   IRON 129 12/17/2019 0926   FERRITIN 348 (H) 10/22/2019 0222   IRONPCTSAT 43.1 12/17/2019 0926   Lipid Panel     Component Value Date/Time   CHOL 191 12/26/2022 0807   CHOL 305 (H)  08/21/2022 0958   TRIG 79.0 12/26/2022 0807   HDL 71.40 12/26/2022 0807   HDL 92 08/21/2022 0958   CHOLHDL 3 12/26/2022 0807   VLDL 15.8 12/26/2022 0807   LDLCALC 104 (H) 12/26/2022 0807   LDLCALC 200 (H) 08/21/2022 0958   LDLDIRECT 164.5 09/19/2012 1052   Hepatic Function Panel     Component Value Date/Time   PROT 6.7 05/03/2023 0840   ALBUMIN 4.5 05/03/2023 0840   AST 13 05/03/2023 0840   ALT 15 05/03/2023 0840   ALKPHOS 74 05/03/2023 0840   BILITOT 0.3 05/03/2023 0840   BILIDIR 0.1 12/26/2022 0807   BILIDIR <0.10 11/02/2021 0847      Component Value Date/Time   TSH 1.33 12/26/2022 0807   Nutritional Lab Results  Component Value Date   VD25OH 48.7 05/03/2023   VD25OH 14.37 (L) 12/26/2022   VD25OH 18.32 (L) 02/24/2022    Attestations:   I, Clinical biochemist, acting as a Stage manager for [employer], DO., have compiled all relevant documentation for today's office visit on behalf of Thomasene Lot, DO, while in the presence of [employer], DO.  I have reviewed the above documentation for accuracy and completeness, and I agree with the above. [name], D.O.  The 21st Century Cures Act was signed into law in 2016 which includes the topic of electronic health records.  This provides immediate access to information in MyChart.  This includes consultation notes, operative notes, office notes, lab results and pathology reports.  If you have any questions about what you read please let us know at your next visit so we can discuss your concerns and take corrective action if need be.  We are right here with you.

## 2023-07-27 ENCOUNTER — Other Ambulatory Visit (HOSPITAL_COMMUNITY): Payer: Self-pay

## 2023-08-06 ENCOUNTER — Encounter: Payer: Self-pay | Admitting: Cardiovascular Disease

## 2023-08-06 ENCOUNTER — Encounter (INDEPENDENT_AMBULATORY_CARE_PROVIDER_SITE_OTHER): Payer: Self-pay | Admitting: Family Medicine

## 2023-08-06 ENCOUNTER — Other Ambulatory Visit: Payer: Self-pay

## 2023-08-06 ENCOUNTER — Ambulatory Visit (INDEPENDENT_AMBULATORY_CARE_PROVIDER_SITE_OTHER): Payer: Commercial Managed Care - PPO | Admitting: Family Medicine

## 2023-08-06 VITALS — BP 116/83 | HR 73 | Temp 97.8°F | Ht 66.0 in | Wt 171.0 lb

## 2023-08-06 DIAGNOSIS — Z6827 Body mass index (BMI) 27.0-27.9, adult: Secondary | ICD-10-CM | POA: Diagnosis not present

## 2023-08-06 DIAGNOSIS — R7303 Prediabetes: Secondary | ICD-10-CM | POA: Diagnosis not present

## 2023-08-06 DIAGNOSIS — E559 Vitamin D deficiency, unspecified: Secondary | ICD-10-CM

## 2023-08-06 DIAGNOSIS — E66811 Obesity, class 1: Secondary | ICD-10-CM

## 2023-08-06 MED ORDER — VITAMIN D (ERGOCALCIFEROL) 1.25 MG (50000 UNIT) PO CAPS: 50000.0000 [IU] | ORAL_CAPSULE | ORAL | 0 refills | Status: DC

## 2023-08-06 NOTE — Addendum Note (Signed)
Addended by: Ihor Austin D on: 08/06/2023 12:57 PM   Modules accepted: Orders

## 2023-08-06 NOTE — Progress Notes (Signed)
Lauren Wolfe, D.O.  ABFM, ABOM Specializing in Clinical Bariatric Medicine  Office located at: [address] [place], [place]  [address]     Assessment and Plan:  Draw fasting labs next OV Medications Discontinued During This Encounter  Medication Reason   Vitamin D, Ergocalciferol, (DRISDOL) 1.25 MG (50000 UNIT) CAPS capsule Reorder    Meds ordered this encounter  Medications   Vitamin D, Ergocalciferol, (DRISDOL) 1.25 MG (50000 UNIT) CAPS capsule    Sig: Take 1 capsule (50,000 Units total) by mouth every 7 (seven) days.    Dispense:  4 capsule    Refill:  0    Vitamin D deficiency Assessment: Condition is Improving, but not optimized.Marland Kitchen Pt vitamin D levels continue to improve but are not within optimal level yet. Patient reports good compliance and tolerance of taking ERGO once weekly. She denies any adverse side effects  Lab Results  Component Value Date   VD25OH 48.7 05/03/2023   VD25OH 14.37 (L) 12/26/2022   VD25OH 18.32 (L) 02/24/2022    Plan:- Continue Ergocalciferol 50K IU as directed. I will refill today.    - ideal vitamin D levels reviewed with patient    - With winter approaching her vitamin D levels are prone to drop so we will need to monitor levels regularly to keep levels within normal limits and prevent over supplementation.   Prediabetes Assessment: Condition is Improving, but not optimized.. She endorses having a control on hunger but an increase in craving sweets. She does not know if this is stress induced or not. She will eat a feeling meal and still crave something sweet for dessert. Pt is currently taking Zepbound but denies wanting a increase on dosage.  Lab Results  Component Value Date   HGBA1C 5.3 05/03/2023   HGBA1C 5.7 12/26/2022   HGBA1C 5.3 02/24/2022   INSULIN 10.3 05/03/2023   INSULIN 10.5 01/16/2023    Plan: - Continue Zepbound 5mg  once weekly as she denies an increase in dosage.    - Explained role of simple carbs and  insulin levels on hunger and cravings  - Anticipatory guidance given.    - Lauren Wolfe will continue to work on weight loss, exercise, via their meal plan we devised to help decrease the risk of progressing to diabetes.   - We will recheck A1c and fasting insulin level in approximately 3 months from last check, or as deemed appropriate.    TREATMENT PLAN FOR OBESITY: BMI 27.0-27.9,adult- current 27.61 Class 1 obesity with serious comorbidity and body mass index (BMI) of 31.0 to 31.9 in adult, unspecified obesity type Assessment:  Lauren Wolfe is here to discuss her progress with her obesity treatment plan along with follow-up of her obesity related diagnoses. See Medical Weight Management Flowsheet for complete bioelectrical impedance results.  Condition is improving. Biometric data collected today, was reviewed with patient.   Since last office visit on 07/16/2023 patient's  Muscle mass has increased by 0.8lb. Fat mass has decreased by 0.2lb. Total body water has increased by 1lb.  Counseling done on how various foods will affect these numbers and how to maximize success  Total lbs lost to date: 26 Total weight loss percentage to date: 13.20%   Plan:  Lauren Wolfe is currently in the action stage of change and will continue keeping a food journal and adhering to recommended goals of 1050-1150 calories and 80+ protein and the Pescatarian Plan.   Behavioral Intervention Additional resources provided today: patient declined Evidence-based interventions for health  behavior change were utilized today including the discussion of self monitoring techniques, problem-solving barriers and SMART goal setting techniques.   Regarding patient's less desirable eating habits and patterns, we employed the technique of small changes.  Pt will specifically work on: start exercising for next visit.     She has agreed to Think about enjoyable ways to increase daily physical activity and overcoming  barriers to exercise and Increase physical activity in their day and reduce sedentary time (increase NEAT).   FOLLOW UP: Return in about 5 weeks (around 09/10/2023).  She was informed of the importance of frequent follow up visits to maximize her success with intensive lifestyle modifications for her multiple health conditions.  Subjective:   Chief complaint: Obesity Lauren Wolfe is here to discuss her progress with her obesity treatment plan. She is on the keeping a food journal and adhering to recommended goals of 1050-1150 calories and 80+ protein and the pescatarian plan and states she is following her eating plan approximately 70% of the time. She states she is not exercising.  Interval History:  Lauren Wolfe is here for a follow up office visit.     Since last office visit:  Pt states that she has been doing better since last visit. Her boyfriend is doing better with his health concerns since last OV and her daughter is nearing time to give birth so she is anxiously waiting. 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 She endorses not wanting to lose much weight as she is already under her goal weight. She has started trying to lift more heavy things and increased her movement during her day to day activities.   We reviewed her meal plan and all questions were answered.   Pharmacotherapy for weight loss: She is currently taking  zepbound  for medical weight loss.  Denies side effects.    Review of Systems:  Pertinent positives were addressed with patient today.  Reviewed by clinician on day of visit: allergies, medications, problem list, medical history, surgical history, family history, social history, and previous encounter notes.  Weight Summary and Biometrics   Weight Lost Since Last Visit: 0lb  Weight Gained Since Last Visit: 1lb   Vitals Temp: 97.8 F (36.6 C) BP: 116/83 Pulse Rate: 73 SpO2: 100 %   Anthropometric Measurements Height: 5'  6" (1.676 m) Weight: 171 lb (77.6 kg) BMI (Calculated): 27.61 Weight at Last Visit: 170lb Weight Lost Since Last Visit: 0lb Weight Gained Since Last Visit: 1lb Starting Weight: 197lb Total Weight Loss (lbs): 26 lb (11.8 kg) Peak Weight: 225lb   Body Composition  Body Fat %: 34.7 % Fat Mass (lbs): 59.4 lbs Muscle Mass (lbs): 106.4 lbs Total Body Water (lbs): 75.2 lbs Visceral Fat Rating : 7   Other Clinical Data Fasting: no Labs: no Today's Visit #: 10 Starting Date: 01/16/23     Objective:   PHYSICAL EXAM: Blood pressure 116/83, pulse 73, temperature 97.8 F (36.6 C), height 5\' 6"  (1.676 m), weight 171 lb (77.6 kg), SpO2 100%. Body mass index is 27.6 kg/m.  General: Well Developed, well nourished, and in no acute distress.  HEENT: Normocephalic, atraumatic Skin: Warm and dry, cap RF less 2 sec, good turgor Chest:  Normal excursion, shape, no gross abn Respiratory: speaking in full sentences, no conversational dyspnea NeuroM-Sk: Ambulates w/o assistance, moves * 4 Psych: A and O *3, insight good, mood-full  DIAGNOSTIC DATA REVIEWED:  BMET    Component Value Date/Time   NA  141 05/03/2023 0840   K 4.2 05/03/2023 0840   CL 105 05/03/2023 0840   CO2 23 05/03/2023 0840   GLUCOSE 82 05/03/2023 0840   GLUCOSE 84 12/26/2022 0807   BUN 14 05/03/2023 0840   CREATININE 0.82 05/03/2023 0840   CALCIUM 9.2 05/03/2023 0840   GFRNONAA 67 07/30/2020 1051   GFRAA 77 07/30/2020 1051   Lab Results  Component Value Date   HGBA1C 5.3 05/03/2023   HGBA1C 5.3 02/24/2022   Lab Results  Component Value Date   INSULIN 10.3 05/03/2023   INSULIN 10.5 01/16/2023   Lab Results  Component Value Date   TSH 1.33 12/26/2022   CBC    Component Value Date/Time   WBC 6.6 12/26/2022 0807   RBC 4.85 12/26/2022 0807   HGB 14.8 12/26/2022 0807   HCT 44.0 12/26/2022 0807   PLT 249.0 12/26/2022 0807   MCV 90.8 12/26/2022 0807   MCH 29.8 11/11/2019 2237   MCHC 33.7 12/26/2022  0807   RDW 13.3 12/26/2022 0807   Iron Studies    Component Value Date/Time   IRON 129 12/17/2019 0926   FERRITIN 348 (H) 10/22/2019 0222   IRONPCTSAT 43.1 12/17/2019 0926   Lipid Panel     Component Value Date/Time   CHOL 191 12/26/2022 0807   CHOL 305 (H) 08/21/2022 0958   TRIG 79.0 12/26/2022 0807   HDL 71.40 12/26/2022 0807   HDL 92 08/21/2022 0958   CHOLHDL 3 12/26/2022 0807   VLDL 15.8 12/26/2022 0807   LDLCALC 104 (H) 12/26/2022 0807   LDLCALC 200 (H) 08/21/2022 0958   LDLDIRECT 164.5 09/19/2012 1052   Hepatic Function Panel     Component Value Date/Time   PROT 6.7 05/03/2023 0840   ALBUMIN 4.5 05/03/2023 0840   AST 13 05/03/2023 0840   ALT 15 05/03/2023 0840   ALKPHOS 74 05/03/2023 0840   BILITOT 0.3 05/03/2023 0840   BILIDIR 0.1 12/26/2022 0807   BILIDIR <0.10 11/02/2021 0847      Component Value Date/Time   TSH 1.33 12/26/2022 0807   Nutritional Lab Results  Component Value Date   VD25OH 48.7 05/03/2023   VD25OH 14.37 (L) 12/26/2022   VD25OH 18.32 (L) 02/24/2022    Attestations:   I, Clinical biochemist, acting as a Stage manager for [employer], DO., have compiled all relevant documentation for today's office visit on behalf of Thomasene Lot, DO, while in the presence of [employer], DO.  I have reviewed the above documentation for accuracy and completeness, and I agree with the above. Lauren Wolfe, D.O.  The 21st Century Cures Act was signed into law in 2016 which includes the topic of electronic health records.  This provides immediate access to information in MyChart.  This includes consultation notes, operative notes, office notes, lab results and pathology reports.  If you have any questions about what you read please let us know at your next visit so we can discuss your concerns and take corrective action if need be.  We are right here with you.

## 2023-08-09 ENCOUNTER — Other Ambulatory Visit: Payer: Self-pay

## 2023-08-23 ENCOUNTER — Other Ambulatory Visit: Payer: Self-pay

## 2023-08-23 ENCOUNTER — Other Ambulatory Visit: Payer: Self-pay | Admitting: Neurology

## 2023-08-23 MED ORDER — BOTOX 200 UNITS IJ SOLR
INTRAMUSCULAR | 2 refills | Status: DC
  Filled 2023-08-24: qty 1, 84d supply, fill #0
  Filled 2023-11-27: qty 1, 84d supply, fill #1
  Filled 2024-03-12: qty 1, 84d supply, fill #2

## 2023-08-23 NOTE — Progress Notes (Signed)
Specialty Pharmacy Refill Coordination Note  Lauren Wolfe is a 47 y.o. female contacted today regarding refills of specialty medication(s) Onabotulinumtoxina   Patient requested Courier to Provider Office   Delivery date: 09/03/23   Verified address: [facility]  [address] [place] [address]   Medication will be filled on 08/31/23 pending a refill request.

## 2023-08-24 ENCOUNTER — Other Ambulatory Visit: Payer: Self-pay

## 2023-08-24 ENCOUNTER — Other Ambulatory Visit (HOSPITAL_COMMUNITY): Payer: Self-pay

## 2023-08-24 NOTE — Progress Notes (Signed)
Pending Refill Request completed.

## 2023-08-28 ENCOUNTER — Ambulatory Visit: Payer: Commercial Managed Care - PPO | Admitting: Neurology

## 2023-08-31 ENCOUNTER — Other Ambulatory Visit: Payer: Self-pay

## 2023-09-03 ENCOUNTER — Other Ambulatory Visit: Payer: Self-pay

## 2023-09-05 ENCOUNTER — Ambulatory Visit (INDEPENDENT_AMBULATORY_CARE_PROVIDER_SITE_OTHER): Payer: Commercial Managed Care - PPO | Admitting: Neurology

## 2023-09-05 VITALS — BP 131/85 | HR 80

## 2023-09-05 DIAGNOSIS — G43709 Chronic migraine without aura, not intractable, without status migrainosus: Secondary | ICD-10-CM | POA: Diagnosis not present

## 2023-09-05 MED ORDER — TIZANIDINE HCL 4 MG PO TABS: 4.0000 mg | ORAL_TABLET | Freq: Three times a day (TID) | ORAL | 1 refills | Status: DC | PRN

## 2023-09-05 MED ORDER — NURTEC 75 MG PO TBDP: 75.0000 mg | ORAL_TABLET | ORAL | 11 refills | Status: DC

## 2023-09-05 MED ORDER — ONDANSETRON 4 MG PO TBDP: ORAL_TABLET | ORAL | 8 refills | Status: DC

## 2023-09-05 MED ORDER — ONABOTULINUMTOXINA 200 UNITS IJ SOLR
155.0000 [IU] | Freq: Once | INTRAMUSCULAR | Status: AC
  Administered 2023-09-05: 155 [IU] via INTRAMUSCULAR

## 2023-09-05 NOTE — Progress Notes (Signed)
Botox- 200 units x 1 vial Lot:[id number] Expiration: 11/2025 NDC: 1610-9604-54  Bacteriostatic 0.9% Sodium Chloride- 4mL total Lot: [id number] Expiration: 01/2024 NDC: 1478-2956-21  Dx: H08.657 SP  Witnessed by: Cinda Quest, CMA

## 2023-09-05 NOTE — Progress Notes (Signed)
BOTOX PROCEDURE NOTE FOR MIGRAINE HEADACHE   HISTORY: Here for Botox, this will be her 6th injection. Last was 05/31/23 by me. She has done great. Only 3-5 migraines a month, Nurtec works great, these are milder.  Does experience wearing off of Botox about 2 weeks before next injection cycle.  Migraines are under excellent control, almost forgets she has them until Botox wears off.  She does utilize Zofran and tizanidine for rescue as needed.  Description of procedure:  The patient was placed in a sitting position. The standard protocol was used for Botox as follows, with 5 units of Botox injected at each site:  -Procerus muscle, midline injection  -Corrugator muscle, bilateral injection  -Frontalis muscle, bilateral injection, with 2 sites each side, medial injection was performed in the upper one third of the frontalis muscle, in the region vertical from the medial inferior edge of the superior orbital rim. The lateral injection was again in the upper one third of the forehead vertically above the lateral limbus of the cornea, 1.5 cm lateral to the medial injection site.  -Temporalis muscle injection, 4 sites, bilaterally. The first injection was 3 cm above the tragus of the ear, second injection site was 1.5 cm to 3 cm up from the first injection site in line with the tragus of the ear. The third injection site was 1.5-3 cm forward between the first 2 injection sites. The fourth injection site was 1.5 cm posterior to the second injection site.  -Occipitalis muscle injection, 3 sites, bilaterally. The first injection was done one half way between the occipital protuberance and the tip of the mastoid process behind the ear. The second injection site was done lateral and superior to the first, 1 fingerbreadth from the first injection. The third injection site was 1 fingerbreadth superiorly and medially from the first injection site.  -Cervical paraspinal muscle injection, 2 sites, bilateral,  the first injection site was 1 cm from the midline of the cervical spine, 3 cm inferior to the lower border of the occipital protuberance. The second injection site was 1.5 cm superiorly and laterally to the first injection site.  -Trapezius muscle injection was performed at 3 sites, bilaterally. The first injection site was in the upper trapezius muscle halfway between the inflection point of the neck, and the acromion. The second injection site was one half way between the acromion and the first injection site. The third injection was done between the first injection site and the inflection point of the neck.   A 200 unit bottle of Botox was used, 155 units were injected, the rest of the Botox was wasted. The patient tolerated the procedure well, there were no complications of the above procedure.  Botox NDC 4098-1191-47 Lot number [id number] Expiration date 11/2025 SP  Meds ordered this encounter  Medications   botulinum toxin Type A (BOTOX) injection 155 Units   tiZANidine (ZANAFLEX) 4 MG tablet    Sig: Take 1 tablet (4 mg total) by mouth every 8 (eight) hours as needed (For migraines.).    Dispense:  30 tablet    Refill:  1   Rimegepant Sulfate (NURTEC) 75 MG TBDP    Sig: Take 1 tablet (75 mg total) by mouth every other day.    Dispense:  16 tablet    Refill:  11    Insurance will approve # 16 tablets per patient, she is getting this   ondansetron (ZOFRAN-ODT) 4 MG disintegrating tablet    Sig: TAKE 1 TABLET BY  MOUTH EVERY 8 HOURS AS NEEDED FOR NAUSEA AND VOMITING    Dispense:  9 tablet    Refill:  8

## 2023-09-06 ENCOUNTER — Telehealth: Payer: Self-pay | Admitting: Pharmacist

## 2023-09-06 ENCOUNTER — Ambulatory Visit: Payer: Commercial Managed Care - PPO | Admitting: Neurology

## 2023-09-06 NOTE — Telephone Encounter (Signed)
Pharmacy Patient Advocate Encounter   Received notification from CoverMyMeds that prior authorization for Zepbound 2.5MG /0.5ML pen-injectors is required/requested.   Insurance verification completed.   The patient is insured through [facility] .   Per test claim: PA required; PA submitted to above mentioned insurance via CoverMyMeds Key/confirmation #/EOC [id number] Status is pending

## 2023-09-07 ENCOUNTER — Other Ambulatory Visit (HOSPITAL_COMMUNITY): Payer: Self-pay

## 2023-09-07 NOTE — Telephone Encounter (Signed)
Pharmacy Patient Advocate Encounter  Received notification from [facility] that Prior Authorization for ZEPBOUND has been APPROVED from 09/05/2023 to 09/05/2024   PA #/Case ID/Reference #: [id number]

## 2023-09-13 ENCOUNTER — Encounter (INDEPENDENT_AMBULATORY_CARE_PROVIDER_SITE_OTHER): Payer: Self-pay | Admitting: Family Medicine

## 2023-09-13 ENCOUNTER — Ambulatory Visit (INDEPENDENT_AMBULATORY_CARE_PROVIDER_SITE_OTHER): Payer: Commercial Managed Care - PPO | Admitting: Family Medicine

## 2023-09-13 VITALS — BP 129/83 | HR 68 | Temp 98.2°F | Ht 66.0 in | Wt 170.0 lb

## 2023-09-13 DIAGNOSIS — E559 Vitamin D deficiency, unspecified: Secondary | ICD-10-CM | POA: Diagnosis not present

## 2023-09-13 DIAGNOSIS — R7303 Prediabetes: Secondary | ICD-10-CM | POA: Diagnosis not present

## 2023-09-13 DIAGNOSIS — E7849 Other hyperlipidemia: Secondary | ICD-10-CM

## 2023-09-13 DIAGNOSIS — E66811 Obesity, class 1: Secondary | ICD-10-CM

## 2023-09-13 DIAGNOSIS — Z6831 Body mass index (BMI) 31.0-31.9, adult: Secondary | ICD-10-CM

## 2023-09-13 DIAGNOSIS — Z6827 Body mass index (BMI) 27.0-27.9, adult: Secondary | ICD-10-CM

## 2023-09-13 MED ORDER — VITAMIN D (ERGOCALCIFEROL) 1.25 MG (50000 UNIT) PO CAPS: 50000.0000 [IU] | ORAL_CAPSULE | ORAL | 0 refills | Status: DC

## 2023-09-13 NOTE — Progress Notes (Incomplete)
[name], D.O.  ABFM, ABOM Specializing in Clinical Bariatric Medicine  Office located at: [address] [place], [place]  [address]   Assessment and Plan:   FOR THE DISEASE OF OBESITY: Class 1 obesity with serious comorbidity and body mass index (BMI) of 31.0 to 31.9 in adult, unspecified obesity type Assessment & Plan: Since last office visit on 08/06/23 patient's  Muscle mass has decreased by 1.4 lb. Fat mass has increased by 4 lb. Total body water has decreased by 1 lb.  Counseling done on how various foods will affect these numbers and how to maximize success  Total lbs lost to date: 27 lbs  Total weight loss percentage to date: 13.71%    Recommended Dietary Goals Lauren Wolfe is currently in the action stage of change. As such, her goal is to continue weight management plan.  She has agreed to: continue current plan   Behavioral Intervention We discussed the following today: continue to work on maintaining a reduced calorie state, getting the recommended amount of protein, incorporating whole foods, making healthy choices, staying well hydrated and practicing mindfulness when eating.  Additional resources provided today: None  Evidence-based interventions for health behavior change were utilized today including the discussion of self monitoring techniques, problem-solving barriers and SMART goal setting techniques.   Regarding patient's less desirable eating habits and patterns, we employed the technique of small changes.   Pt will specifically work on: n/a    Recommended Physical Activity Goals Lauren Wolfe has been advised to work up to 150 minutes of moderate intensity aerobic activity a week and strengthening exercises 2-3 times per week for cardiovascular health, weight loss maintenance and preservation of muscle mass.   She has agreed to : Think about enjoyable ways to increase daily physical activity and overcoming barriers to  exercise   Pharmacotherapy We both agreed to : continue current anti-obesity medication regimen   FOR ASSOCIATED CONDITIONS ADDRESSED TODAY:  Prediabetes Assessment & Plan: Most recent A1c and fasting insulin: Lab Results  Component Value Date   HGBA1C 5.3 05/03/2023   HGBA1C 5.7 12/26/2022   HGBA1C 5.3 02/24/2022   INSULIN 10.3 05/03/2023   INSULIN 10.5 01/16/2023    She is currently on Zepbound 5 mg once every 2 weeks. She is tolerating medication well, denies any SE. Hunger is well controlled. She admits to sometimes having sweet cravings. She is not interested in increasing her Zepbound today. We discussed Metformin, which we may consider initiating in the future. Pt will work on increasing protein to the recommended goals and continue with weight loss therapy.   Orders:  -     Hemoglobin A1c -     Insulin, random   Vitamin D deficiency Assessment & Plan: Most recent Vitamin D: Lab Results  Component Value Date   VD25OH 48.7 05/03/2023   VD25OH 14.37 (L) 12/26/2022   VD25OH 18.32 (L) 02/24/2022   She is currently on ERGO 50,000 units once a week without any adverse SE. She will continue with weight loss efforts and ERGO at current dose. Will recheck vit D today.   Orders: -     Vitamin D (Ergocalciferol); Take 1 capsule (50,000 Units total) by mouth every 7 (seven) days.  Dispense: 4 capsule; Refill: 0 -     VITAMIN D 25 Hydroxy (Vit-D Deficiency, Fractures)   Other hyperlipidemia Assessment & Plan: Most recent lipid panel: Lab Results  Component Value Date   CHOL 191 12/26/2022   HDL 71.40 12/26/2022  LDLCALC 104 (H) 12/26/2022   LDLDIRECT 164.5 09/19/2012   TRIG 79.0 12/26/2022   CHOLHDL 3 12/26/2022   She has been on Inclisiran Sodium per cardiology since July or so.  Will recheck Lipid panel today as part of routine screening. She will continue with the injections and our heart-healthy, low cholesterol meal plan.   Orders: -     Comprehensive  metabolic panel -     Lipid panel   Follow up:   Return 10/23/23. She was informed of the importance of frequent follow up visits to maximize her success with intensive lifestyle modifications for her multiple health conditions.  Lauren Wolfe is aware that we will review all of her lab results at our next visit.  She is aware that if anything is critical/ life threatening with the results, we will be contacting her via MyChart prior to the office visit to discuss management.    Subjective:   Chief complaint: Obesity Lauren Wolfe is here to discuss her progress with her obesity treatment plan. She is keeping a food journal and adhering to recommended goals of 1050-1150 calories and 80+ protein and the Pescatarian Plan and states she is following her eating plan approximately 70% of the time. She states she is not exercising.   Interval History:  Lauren Wolfe is here for a follow up office visit. Since last OV, Lauren Wolfe is down 1 lb. She has been busy and unable to exercise due to school, exams, and grandchildren. Food wise, she endorses being protein focused. No issues with hunger, sometimes has sweet cravings.   Pharmacotherapy for weight loss: She is currently taking  Zepbound 5 mg once every 2 weeks.  .   Review of Systems:  Pertinent positives were addressed with patient today.  Reviewed by clinician on day of visit: allergies, medications, problem list, medical history, surgical history, family history, social history, and previous encounter notes.  Weight Summary and Biometrics   Weight Lost Since Last Visit: 1 lb  Weight Gained Since Last Visit: 0   Vitals Temp: 98.2 F (36.8 C) BP: 129/83 Pulse Rate: 68 SpO2: 100 %   Anthropometric Measurements Height: 5\' 6"  (1.676 m) Weight: 170 lb (77.1 kg) BMI (Calculated): 27.45 Weight at Last Visit: 170 lb Weight Lost Since Last Visit: 1 lb Weight Gained Since Last Visit: 0 Starting Weight: 197 lb Total Weight Loss  (lbs): 27 lb (12.2 kg) Peak Weight: 225 lb   Body Composition  Body Fat %: 35.1 % Fat Mass (lbs): 59.8 lbs Muscle Mass (lbs): 105 lbs Total Body Water (lbs): 74.2 lbs Visceral Fat Rating : 7   Other Clinical Data Fasting: yes Labs: yes Today's Visit #: 11 Starting Date: 01/16/23   Objective:   PHYSICAL EXAM: Blood pressure 129/83, pulse 68, temperature 98.2 F (36.8 C), height 5\' 6"  (1.676 m), weight 170 lb (77.1 kg), SpO2 100%. Body mass index is 27.44 kg/m.  General: she is overweight, cooperative and in no acute distress. PSYCH: Has normal mood, affect and thought process.   HEENT: EOMI, sclerae are anicteric. Lungs: Normal breathing effort, no conversational dyspnea. Extremities: Moves * 4 Neurologic: A and O * 3, good insight  DIAGNOSTIC DATA REVIEWED: BMET    Component Value Date/Time   NA 141 05/03/2023 0840   K 4.2 05/03/2023 0840   CL 105 05/03/2023 0840   CO2 23 05/03/2023 0840   GLUCOSE 82 05/03/2023 0840   GLUCOSE 84 12/26/2022 0807   BUN 14 05/03/2023 0840   CREATININE  0.82 05/03/2023 0840   CALCIUM 9.2 05/03/2023 0840   GFRNONAA 67 07/30/2020 1051   GFRAA 77 07/30/2020 1051   Lab Results  Component Value Date   HGBA1C 5.3 05/03/2023   HGBA1C 5.3 02/24/2022   Lab Results  Component Value Date   INSULIN 10.3 05/03/2023   INSULIN 10.5 01/16/2023   Lab Results  Component Value Date   TSH 1.33 12/26/2022   CBC    Component Value Date/Time   WBC 6.6 12/26/2022 0807   RBC 4.85 12/26/2022 0807   HGB 14.8 12/26/2022 0807   HCT 44.0 12/26/2022 0807   PLT 249.0 12/26/2022 0807   MCV 90.8 12/26/2022 0807   MCH 29.8 11/11/2019 2237   MCHC 33.7 12/26/2022 0807   RDW 13.3 12/26/2022 0807   Iron Studies    Component Value Date/Time   IRON 129 12/17/2019 0926   FERRITIN 348 (H) 10/22/2019 0222   IRONPCTSAT 43.1 12/17/2019 0926   Lipid Panel     Component Value Date/Time   CHOL 191 12/26/2022 0807   CHOL 305 (H) 08/21/2022 0958    TRIG 79.0 12/26/2022 0807   HDL 71.40 12/26/2022 0807   HDL 92 08/21/2022 0958   CHOLHDL 3 12/26/2022 0807   VLDL 15.8 12/26/2022 0807   LDLCALC 104 (H) 12/26/2022 0807   LDLCALC 200 (H) 08/21/2022 0958   LDLDIRECT 164.5 09/19/2012 1052   Hepatic Function Panel     Component Value Date/Time   PROT 6.7 05/03/2023 0840   ALBUMIN 4.5 05/03/2023 0840   AST 13 05/03/2023 0840   ALT 15 05/03/2023 0840   ALKPHOS 74 05/03/2023 0840   BILITOT 0.3 05/03/2023 0840   BILIDIR 0.1 12/26/2022 0807   BILIDIR <0.10 11/02/2021 0847      Component Value Date/Time   TSH 1.33 12/26/2022 0807   Nutritional Lab Results  Component Value Date   VD25OH 48.7 05/03/2023   VD25OH 14.37 (L) 12/26/2022   VD25OH 18.32 (L) 02/24/2022    Attestations:   I, Special Puri, acting as a Stage manager for Thomasene Lot, DO., have compiled all relevant documentation for today's office visit on behalf of Thomasene Lot, DO, while in the presence of [employer], DO.  I have reviewed the above documentation for accuracy and completeness, and I agree with the above. [name], D.O.  The 21st Century Cures Act was signed into law in 2016 which includes the topic of electronic health records.  This provides immediate access to information in MyChart.  This includes consultation notes, operative notes, office notes, lab results and pathology reports.  If you have any questions about what you read please let us know at your next visit so we can discuss your concerns and take corrective action if need be.  We are right here with you.

## 2023-09-14 DIAGNOSIS — E669 Obesity, unspecified: Secondary | ICD-10-CM

## 2023-09-14 DIAGNOSIS — E7800 Pure hypercholesterolemia, unspecified: Secondary | ICD-10-CM

## 2023-09-14 DIAGNOSIS — T466X5A Adverse effect of antihyperlipidemic and antiarteriosclerotic drugs, initial encounter: Secondary | ICD-10-CM

## 2023-09-14 DIAGNOSIS — E7849 Other hyperlipidemia: Secondary | ICD-10-CM

## 2023-09-14 LAB — COMPREHENSIVE METABOLIC PANEL
ALT: 15 [IU]/L (ref 0–32)
AST: 19 [IU]/L (ref 0–40)
Albumin: 4.6 g/dL (ref 3.9–4.9)
Alkaline Phosphatase: 79 [IU]/L (ref 44–121)
BUN/Creatinine Ratio: 18 (ref 9–23)
BUN: 15 mg/dL (ref 6–24)
Bilirubin Total: 0.3 mg/dL (ref 0.0–1.2)
CO2: 24 mmol/L (ref 20–29)
Calcium: 9.4 mg/dL (ref 8.7–10.2)
Chloride: 103 mmol/L (ref 96–106)
Creatinine, Ser: 0.83 mg/dL (ref 0.57–1.00)
Globulin, Total: 2.4 g/dL (ref 1.5–4.5)
Glucose: 74 mg/dL (ref 70–99)
Potassium: 4.1 mmol/L (ref 3.5–5.2)
Sodium: 141 mmol/L (ref 134–144)
Total Protein: 7 g/dL (ref 6.0–8.5)
eGFR: 87 mL/min/{1.73_m2} (ref >59)

## 2023-09-14 LAB — LIPID PANEL
Chol/HDL Ratio: 3 {ratio} (ref 0.0–4.4)
Cholesterol, Total: 220 mg/dL — ABNORMAL HIGH (ref 100–199)
HDL: 73 mg/dL (ref >39)
LDL Chol Calc (NIH): 139 mg/dL — ABNORMAL HIGH (ref 0–99)
Triglycerides: 48 mg/dL (ref 0–149)
VLDL Cholesterol Cal: 8 mg/dL (ref 5–40)

## 2023-09-14 LAB — INSULIN, RANDOM: INSULIN: 9.7 u[IU]/mL (ref 2.6–24.9)

## 2023-09-14 LAB — HEMOGLOBIN A1C
Est. average glucose Bld gHb Est-mCnc: 103 mg/dL
Hgb A1c MFr Bld: 5.2 % (ref 4.8–5.6)

## 2023-09-14 LAB — VITAMIN D 25 HYDROXY (VIT D DEFICIENCY, FRACTURES): Vit D, 25-Hydroxy: 44 ng/mL (ref 30.0–100.0)

## 2023-09-17 ENCOUNTER — Other Ambulatory Visit (HOSPITAL_COMMUNITY): Payer: Self-pay

## 2023-09-17 NOTE — Telephone Encounter (Signed)
Will you please refer this patient to pharm D lipid clinic? LDL has increased since starting on Leqvio. Was previously on Repatha.   Thank you!   DW

## 2023-10-05 ENCOUNTER — Other Ambulatory Visit (INDEPENDENT_AMBULATORY_CARE_PROVIDER_SITE_OTHER): Payer: Self-pay | Admitting: Family Medicine

## 2023-10-05 DIAGNOSIS — E559 Vitamin D deficiency, unspecified: Secondary | ICD-10-CM

## 2023-10-11 ENCOUNTER — Encounter: Payer: Self-pay | Admitting: Cardiovascular Disease

## 2023-10-15 ENCOUNTER — Other Ambulatory Visit (HOSPITAL_COMMUNITY): Payer: Self-pay

## 2023-10-15 ENCOUNTER — Other Ambulatory Visit (HOSPITAL_BASED_OUTPATIENT_CLINIC_OR_DEPARTMENT_OTHER): Payer: Self-pay

## 2023-10-15 ENCOUNTER — Encounter (HOSPITAL_COMMUNITY): Payer: Self-pay

## 2023-10-17 ENCOUNTER — Other Ambulatory Visit (HOSPITAL_COMMUNITY): Payer: Self-pay

## 2023-10-23 ENCOUNTER — Encounter: Payer: Self-pay | Admitting: Cardiovascular Disease

## 2023-10-23 ENCOUNTER — Ambulatory Visit (INDEPENDENT_AMBULATORY_CARE_PROVIDER_SITE_OTHER): Payer: Self-pay | Admitting: Family Medicine

## 2023-10-23 NOTE — Telephone Encounter (Signed)
Faxed [employer] auth renewal form along with OV notes to [phone].

## 2023-10-24 ENCOUNTER — Encounter (INDEPENDENT_AMBULATORY_CARE_PROVIDER_SITE_OTHER): Payer: Self-pay | Admitting: Family Medicine

## 2023-10-24 ENCOUNTER — Ambulatory Visit (INDEPENDENT_AMBULATORY_CARE_PROVIDER_SITE_OTHER): Payer: Commercial Managed Care - PPO | Admitting: Family Medicine

## 2023-10-24 VITALS — BP 145/83 | HR 72 | Temp 98.7°F | Ht 66.0 in | Wt 170.0 lb

## 2023-10-24 DIAGNOSIS — Z6827 Body mass index (BMI) 27.0-27.9, adult: Secondary | ICD-10-CM

## 2023-10-24 DIAGNOSIS — R7303 Prediabetes: Secondary | ICD-10-CM

## 2023-10-24 DIAGNOSIS — Z6831 Body mass index (BMI) 31.0-31.9, adult: Secondary | ICD-10-CM

## 2023-10-24 DIAGNOSIS — Z6829 Body mass index (BMI) 29.0-29.9, adult: Secondary | ICD-10-CM

## 2023-10-24 DIAGNOSIS — E66811 Obesity, class 1: Secondary | ICD-10-CM | POA: Diagnosis not present

## 2023-10-24 DIAGNOSIS — E7849 Other hyperlipidemia: Secondary | ICD-10-CM

## 2023-10-24 DIAGNOSIS — E559 Vitamin D deficiency, unspecified: Secondary | ICD-10-CM | POA: Diagnosis not present

## 2023-10-24 MED ORDER — VITAMIN D (ERGOCALCIFEROL) 1.25 MG (50000 UNIT) PO CAPS: 50000.0000 [IU] | ORAL_CAPSULE | ORAL | 0 refills | Status: DC

## 2023-10-24 NOTE — Progress Notes (Signed)
Lauren Wolfe, D.O.  ABFM, ABOM Specializing in Clinical Bariatric Medicine  Office located at: [address] [place], [place]  [address]   Assessment and Plan:   FOR THE DISEASE OF OBESITY: BMI 29.0-29.9,adult- current BMI 27.45 Class 1 obesity with serious comorbidity and body mass index (BMI) of 31.0 to 31.9 in adult, unspecified obesity type Assessment & Plan: Since last office visit on 09/13/23 patient's muscle mass has decreased by 3lb. Fat mass has increased by 2.8lb. Total body water has increased by 4.6lb.  Counseling done on how various foods will affect these numbers and how to maximize success  Total lbs lost to date: 27 lbs Total weight loss percentage to date: -13.71%    Recommended Dietary Goals [name] is currently in the action stage of change. As such, her goal is to continue weight management plan.  She has agreed to: continue current plan   Behavioral Intervention We discussed the following today: increasing lean protein intake to established goals, decreasing simple carbohydrates , increasing vegetables, increasing lower glycemic fruits, increasing water intake , keeping healthy foods at home, work on managing stress, creating time for self-care and relaxation, and avoiding temptations and identifying enticing environmental cues  Additional resources provided today: None  Evidence-based interventions for health behavior change were utilized today including the discussion of self monitoring techniques, problem-solving barriers and SMART goal setting techniques.   Regarding patient's less desirable eating habits and patterns, we employed the technique of small changes.   Pt will specifically work on: 30 minutes 5 days a week and remove tempting treats (candies) from her work desk for next visit.    Recommended Physical Activity Goals [name] has been advised to work up to 150 minutes of moderate intensity aerobic activity a week and strengthening  exercises 2-3 times per week for cardiovascular health, weight loss maintenance and preservation of muscle mass.   She has agreed to :  Think about enjoyable ways to increase daily physical activity and overcoming barriers to exercise, Increase physical activity in their day and reduce sedentary time (increase NEAT)., and Increase the intensity, frequency or duration of strengthening exercises    Pharmacotherapy We discussed various medication options to help [name] with her weight loss efforts and we both agreed to : continue with nutritional and behavioral strategies and adequate clinical response to current dose, continue current regimen  Pt is taking Zepbound every week instead of every 2 weeks. Denies any intolerances of side effects. Endorses cravings for candy when stressed at work.  FOR ASSOCIATED CONDITIONS ADDRESSED TODAY: Prediabetes Assessment & Plan:  Lab Results  Component Value Date   HGBA1C 5.2 09/13/2023   HGBA1C 5.3 05/03/2023   HGBA1C 5.7 12/26/2022   INSULIN 9.7 09/13/2023   INSULIN 10.3 05/03/2023   INSULIN 10.5 01/16/2023    Last A1C was at goal at 5.2. Condition well controlled with diet/lifestyle changes. Has been drinking more water since she got a large bottle as a gift for Christmas, using daily. Reports cravings for candies on her desk, usually eats them when she is stressed at work.   Advised pt to limit her consumption of sweets/candy and try moving them off her desk to avoid temptations. Encouraged her to increase the frequency of exercise per week. Reviewed the benefits of exercising such as speeding her metabolism, improving muscle mass, and stress management.   Vitamin D deficiency Assessment & Plan: Lab Results  Component Value Date   VD25OH 44.0 09/13/2023   VD25OH 48.7 05/03/2023  VD25OH 14.37 (L) 12/26/2022   Last vitamin D slightly below goal at 44.0 as of 09/13/23. Pt is on ERGO 50K units once weekly, sometimes takes it a day late.  Tolerating well with no side effects reported.   Reviewed ideal vitamin D goal of 50+. Continue with vitamin supplementation. No changes made today. Will continue to monitor as it pertains to her weight loss journey.   Orders: - Refill ERGO today, no dose changes.    Other hyperlipidemia: Assessment & Plan: Lab Results  Component Value Date   CHOL 220 (H) 09/13/2023   HDL 73 09/13/2023   LDLCALC 139 (H) 09/13/2023   LDLDIRECT 164.5 09/19/2012   TRIG 48 09/13/2023   CHOLHDL 3.0 09/13/2023   As of latest labs on 09/13/23, pt LDL was elevated at 139. Pt is on Inclisiran since last year, which has only slightly improved her cholesterol. 10 months ago lipid panel revealed: LDL at 104, HLD at 71.4, and TG at 79. She reports she is scheduled to go to the lipid clinic soon.   Continue prudent nutrition plan low in trans/saturated fats. Work on increasing exercise to 5 days a week. Follow up with cardiology to look for alternative treatment plans. Recommended pt discuss with cardiology team whether she should get a coronary CT as well as an expanded cholesterol panel. Continue with current medication regimen until directed otherwise.    Follow up:   Return in about 6 weeks (around 12/05/2023). She was informed of the importance of frequent follow up visits to maximize her success with intensive lifestyle modifications for her multiple health conditions.  Subjective:   Chief complaint: Obesity Lauren Wolfe is here to discuss her progress with her obesity treatment plan. She is on the keeping a food journal and adhering to recommended goals of 1050-1150 calories and 80+ g of protein and the Pescatarian Plan and states she is following her eating plan approximately 75% of the time. She states she is lifting weights (10 lbs) 30 minutes 3 days per week.  Interval History:  Lauren Wolfe is here for a follow up office visit. Since last OV, her weight is unchanged, though she reports her weight  fluctuated over the holidays. She reports cravings for sweets (small candies), which she keeps on her desk. Cooks home cooked meals majority of the time. Not journaling anymore but she is eating similar foods from what she used to eat when still journaling. She is also drinking plenty of water daily.   Barriers identified: exposure to enticing environments and/or relationships and moderate to high levels of stress.   Pharmacotherapy for weight loss: She is currently taking Zepbound with adequate clinical response  and without side effects..   Review of Systems:  Pertinent positives were addressed with patient today.  Reviewed by clinician on day of visit: allergies, medications, problem list, medical history, surgical history, family history, social history, and previous encounter notes.  Weight Summary and Biometrics   Weight Lost Since Last Visit: 0  Weight Gained Since Last Visit: 0    Vitals Temp: 98.7 F (37.1 C) BP: (!) 145/83 Pulse Rate: 72 SpO2: 100 %   Anthropometric Measurements Height: 5\' 6"  (1.676 m) Weight: 170 lb (77.1 kg) BMI (Calculated): 27.45 Weight at Last Visit: 170 lb Weight Lost Since Last Visit: 0 Weight Gained Since Last Visit: 0 Starting Weight: 197 lb Total Weight Loss (lbs): 27 lb (12.2 kg) Peak Weight: 225 lb   Body Composition  Body Fat %: 36.8 % Fat Mass (  lbs): 62.6 lbs Muscle Mass (lbs): 102 lbs Total Body Water (lbs): 78.8 lbs Visceral Fat Rating : 7   Other Clinical Data Fasting: no Labs: no Today's Visit #: 12 Starting Date: 01/16/23    Objective:   PHYSICAL EXAM: Blood pressure (!) 145/83, pulse 72, temperature 98.7 F (37.1 C), height 5\' 6"  (1.676 m), weight 170 lb (77.1 kg), SpO2 100%. Body mass index is 27.44 kg/m.  General: she is overweight, cooperative and in no acute distress. PSYCH: Has normal mood, affect and thought process.   HEENT: EOMI, sclerae are anicteric. Lungs: Normal breathing effort, no  conversational dyspnea. Extremities: Moves * 4 Neurologic: A and O * 3, good insight  DIAGNOSTIC DATA REVIEWED: BMET    Component Value Date/Time   NA 141 09/13/2023 1444   K 4.1 09/13/2023 1444   CL 103 09/13/2023 1444   CO2 24 09/13/2023 1444   GLUCOSE 74 09/13/2023 1444   GLUCOSE 84 12/26/2022 0807   BUN 15 09/13/2023 1444   CREATININE 0.83 09/13/2023 1444   CALCIUM 9.4 09/13/2023 1444   GFRNONAA 67 07/30/2020 1051   GFRAA 77 07/30/2020 1051   Lab Results  Component Value Date   HGBA1C 5.2 09/13/2023   HGBA1C 5.3 02/24/2022   Lab Results  Component Value Date   INSULIN 9.7 09/13/2023   INSULIN 10.5 01/16/2023   Lab Results  Component Value Date   TSH 1.33 12/26/2022   CBC    Component Value Date/Time   WBC 6.6 12/26/2022 0807   RBC 4.85 12/26/2022 0807   HGB 14.8 12/26/2022 0807   HCT 44.0 12/26/2022 0807   PLT 249.0 12/26/2022 0807   MCV 90.8 12/26/2022 0807   MCH 29.8 11/11/2019 2237   MCHC 33.7 12/26/2022 0807   RDW 13.3 12/26/2022 0807   Iron Studies    Component Value Date/Time   IRON 129 12/17/2019 0926   FERRITIN 348 (H) 10/22/2019 0222   IRONPCTSAT 43.1 12/17/2019 0926   Lipid Panel     Component Value Date/Time   CHOL 220 (H) 09/13/2023 1444   TRIG 48 09/13/2023 1444   HDL 73 09/13/2023 1444   CHOLHDL 3.0 09/13/2023 1444   CHOLHDL 3 12/26/2022 0807   VLDL 15.8 12/26/2022 0807   LDLCALC 139 (H) 09/13/2023 1444   LDLDIRECT 164.5 09/19/2012 1052   Hepatic Function Panel     Component Value Date/Time   PROT 7.0 09/13/2023 1444   ALBUMIN 4.6 09/13/2023 1444   AST 19 09/13/2023 1444   ALT 15 09/13/2023 1444   ALKPHOS 79 09/13/2023 1444   BILITOT 0.3 09/13/2023 1444   BILIDIR 0.1 12/26/2022 0807   BILIDIR <0.10 11/02/2021 0847      Component Value Date/Time   TSH 1.33 12/26/2022 0807   Nutritional Lab Results  Component Value Date   VD25OH 44.0 09/13/2023   VD25OH 48.7 05/03/2023   VD25OH 14.37 (L) 12/26/2022     Attestations:   Burnett Sheng, acting as a medical scribe for Thomasene Lot, DO., have compiled all relevant documentation for today's office visit on behalf of Thomasene Lot, DO, while in the presence of [employer], DO.  Reviewed by clinician on day of visit: allergies, medications, problem list, medical history, surgical history, family history, social history, and previous encounter notes pertinent to patient's obesity diagnosis.  I have reviewed the above documentation for accuracy and completeness, and I agree with the above. Lauren Wolfe, D.O.  The 21st Century Cures Act was signed into law in 2016  which includes the topic of electronic health records.  This provides immediate access to information in MyChart.  This includes consultation notes, operative notes, office notes, lab results and pathology reports.  If you have any questions about what you read please let us know at your next visit so we can discuss your concerns and take corrective action if need be.  We are right here with you.

## 2023-10-26 ENCOUNTER — Telehealth: Payer: Self-pay

## 2023-10-26 NOTE — Telephone Encounter (Signed)
   Received an approval notice via fax-approval letter is in the media tab. I did not submit PA.

## 2023-10-29 ENCOUNTER — Telehealth: Payer: Self-pay

## 2023-10-29 NOTE — Telephone Encounter (Signed)
Auth Submission: NO AUTH NEEDED Site of care: Site of care: [facility] Payer: [employer] Medication & CPT/J Code(s) submitted: Leqvio (Inclisiran) [id number] Route of submission (phone, fax, portal): phone Phone # [phone] Fax # Auth type: Buy/Bill PB Units/visits requested: 284mg  x 2 doses Reference number: [id number] Approval from: 10/29/23 to 10/01/24

## 2023-11-07 ENCOUNTER — Telehealth: Payer: Self-pay

## 2023-11-07 ENCOUNTER — Other Ambulatory Visit (HOSPITAL_COMMUNITY): Payer: Self-pay

## 2023-11-07 ENCOUNTER — Ambulatory Visit: Payer: Commercial Managed Care - PPO

## 2023-11-07 DIAGNOSIS — E7800 Pure hypercholesterolemia, unspecified: Secondary | ICD-10-CM

## 2023-11-07 DIAGNOSIS — E7849 Other hyperlipidemia: Secondary | ICD-10-CM

## 2023-11-07 DIAGNOSIS — M791 Myalgia, unspecified site: Secondary | ICD-10-CM

## 2023-11-07 NOTE — Telephone Encounter (Signed)
 Pharmacy Patient Advocate Encounter   Received notification from Physician's Office that prior authorization for PRALUENT  is required/requested.   Insurance verification completed.   The patient is insured through [facility] .   Per test claim: PA required; PA submitted to above mentioned insurance via CoverMyMeds Key/confirmation #/EOC [id number] Status is pending

## 2023-11-08 ENCOUNTER — Other Ambulatory Visit (HOSPITAL_COMMUNITY): Payer: Self-pay

## 2023-11-08 NOTE — Telephone Encounter (Signed)
 Request was cancelled out in [facility]. Called plan and left message requesting form. Waiting on manual form.

## 2023-11-09 NOTE — Telephone Encounter (Signed)
 Request form completed and submitted to plan via fax.

## 2023-11-12 ENCOUNTER — Other Ambulatory Visit (HOSPITAL_COMMUNITY): Payer: Self-pay

## 2023-11-12 NOTE — Telephone Encounter (Signed)
 Pharmacy Patient Advocate Encounter  Received notification from [facility] that Prior Authorization for PRALUENT  has been APPROVED from 11/12/23 to 11/11/24. Ran test claim, Copay is $249.70 (DEDUCTIBLE). This test claim was processed through [facility]- copay amounts may vary at other pharmacies due to pharmacy/plan contracts, or as the patient moves through the different stages of their insurance plan.

## 2023-11-12 NOTE — Telephone Encounter (Signed)
 PA#: [id number] (10/23/23-10/22/24)

## 2023-11-14 ENCOUNTER — Encounter: Payer: Self-pay | Admitting: Cardiovascular Disease

## 2023-11-14 ENCOUNTER — Other Ambulatory Visit (HOSPITAL_COMMUNITY): Payer: Self-pay

## 2023-11-14 ENCOUNTER — Other Ambulatory Visit: Payer: Self-pay

## 2023-11-14 ENCOUNTER — Encounter (HOSPITAL_COMMUNITY): Payer: Self-pay

## 2023-11-14 MED ORDER — PRALUENT 150 MG/ML ~~LOC~~ SOAJ
150.0000 mg | SUBCUTANEOUS | 11 refills | Status: DC
  Filled 2023-11-14: qty 2, 28d supply, fill #0
  Filled 2023-12-16: qty 2, 28d supply, fill #1
  Filled 2024-01-13: qty 2, 28d supply, fill #2
  Filled 2024-02-07: qty 2, 28d supply, fill #3
  Filled 2024-03-05: qty 2, 28d supply, fill #4
  Filled 2024-03-31: qty 2, 28d supply, fill #5
  Filled 2024-04-23: qty 2, 28d supply, fill #6
  Filled 2024-05-23: qty 2, 28d supply, fill #7
  Filled 2024-07-01: qty 2, 28d supply, fill #8
  Filled 2024-07-29: qty 2, 28d supply, fill #9
  Filled 2024-09-03: qty 2, 28d supply, fill #10
  Filled 2024-09-27: qty 2, 28d supply, fill #11

## 2023-11-14 NOTE — Addendum Note (Signed)
Addended by: Cheree Ditto on: 11/14/2023 09:59 AM   Modules accepted: Orders

## 2023-11-15 ENCOUNTER — Other Ambulatory Visit (HOSPITAL_COMMUNITY): Payer: Self-pay

## 2023-11-16 ENCOUNTER — Other Ambulatory Visit: Payer: Self-pay

## 2023-11-16 ENCOUNTER — Other Ambulatory Visit (HOSPITAL_COMMUNITY): Payer: Self-pay

## 2023-11-21 ENCOUNTER — Other Ambulatory Visit: Payer: Self-pay

## 2023-11-27 ENCOUNTER — Other Ambulatory Visit: Payer: Self-pay

## 2023-11-27 NOTE — Progress Notes (Signed)
 Specialty Pharmacy Refill Coordination Note  Lauren Wolfe is a 48 y.o. female contacted today regarding refills of specialty medication(s) OnabotulinumtoxinA (Botox)   Patient requested Courier to Provider Office   Delivery date: 12/05/23   Verified address: [facility]  [address] [place] [address]   Medication will be filled on 12/04/23.

## 2023-12-04 ENCOUNTER — Other Ambulatory Visit: Payer: Self-pay

## 2023-12-05 ENCOUNTER — Ambulatory Visit: Payer: Commercial Managed Care - PPO | Admitting: Neurology

## 2023-12-06 ENCOUNTER — Ambulatory Visit (INDEPENDENT_AMBULATORY_CARE_PROVIDER_SITE_OTHER): Payer: Commercial Managed Care - PPO | Admitting: Family Medicine

## 2023-12-06 ENCOUNTER — Encounter (INDEPENDENT_AMBULATORY_CARE_PROVIDER_SITE_OTHER): Payer: Self-pay | Admitting: Family Medicine

## 2023-12-06 VITALS — BP 125/85 | HR 78 | Temp 97.6°F | Ht 66.0 in | Wt 166.0 lb

## 2023-12-06 DIAGNOSIS — E559 Vitamin D deficiency, unspecified: Secondary | ICD-10-CM

## 2023-12-06 DIAGNOSIS — E66811 Obesity, class 1: Secondary | ICD-10-CM

## 2023-12-06 DIAGNOSIS — Z6831 Body mass index (BMI) 31.0-31.9, adult: Secondary | ICD-10-CM | POA: Diagnosis not present

## 2023-12-06 DIAGNOSIS — E7849 Other hyperlipidemia: Secondary | ICD-10-CM | POA: Diagnosis not present

## 2023-12-06 DIAGNOSIS — E6609 Other obesity due to excess calories: Secondary | ICD-10-CM

## 2023-12-06 MED ORDER — VITAMIN D (ERGOCALCIFEROL) 1.25 MG (50000 UNIT) PO CAPS: 50000.0000 [IU] | ORAL_CAPSULE | ORAL | 0 refills | Status: DC

## 2023-12-06 NOTE — Progress Notes (Signed)
 [name], D.O.  ABFM, ABOM Specializing in Clinical Bariatric Medicine  Office located at: [address] [place], [place]  [address]   Assessment and Plan:   FOR THE DISEASE OF OBESITY:  Class 1 obesity due to excess calories without serious comorbidity with body mass index (BMI) of 31.0 to 31.9 in adult , current BMI 26.81 Assessment & Plan: Since last office visit on 10/24/2023 patient's  Muscle mass has increased by 2.4 lb. Fat mass has decreased by 5.8 lb. Total body water has decreased by 7.4 lb.  Counseling done on how various foods will affect these numbers and how to maximize success  Total lbs lost to date: 31 lbs  Total weight loss percentage to date: 26.74%    Recommended Dietary Goals Lisvet is currently in the action stage of change. As such, her goal is to continue weight management plan.  She has agreed to: continue current plan   Behavioral Intervention We discussed the following today: difference between BMI & body fat %, continue to work on implementation of reduced calorie nutritional plan  Additional resources provided today: None  Evidence-based interventions for health behavior change were utilized today including the discussion of self monitoring techniques, problem-solving barriers and SMART goal setting techniques.   Regarding patient's less desirable eating habits and patterns, we employed the technique of small changes.   Pt will specifically work on: n/a   Recommended Physical Activity Goals Cheyanna has been advised to work up to 150 minutes of moderate intensity aerobic activity a week and strengthening exercises 2-3 times per week for cardiovascular health, weight loss maintenance and preservation of muscle mass.   She has agreed to : consider adding resistance training    Pharmacotherapy We both agreed to : continue with nutritional and behavioral strategies and continue GLP-1 therapy   FOR ASSOCIATED CONDITIONS  ADDRESSED TODAY:  Vitamin D deficiency Assessment & Plan: Pt is on prescription high dose vitamin D once weekly without any complications. Continue vitamin D supplementation - recheck levels in the future.  Relevant Orders:  -     Vitamin D (Ergocalciferol); Take 1 capsule (50,000 Units total) by mouth every 7 (seven) days.  Dispense: 12 capsule; Refill: 0   Other hyperlipidemia Assessment & Plan: Was recently switched from Leqvio to Praulent d/t inadequate clinical response on Leqvio. Continue treatment per cardiology. Continue implementation of medically supervised weight management plan.   Follow up:   Return 01/17/2024. She was informed of the importance of frequent follow up visits to maximize her success with intensive lifestyle modifications for her multiple health conditions.  Subjective:   Chief complaint: Obesity Karaline is here to discuss her progress with her obesity treatment plan. She is keeping a food journal and adhering to recommended goals of 1050-1150 calories and 80+ g of protein and the Pescatarian Plan states she is following her eating plan approximately 75% of the time. She states she is not exercising.  Interval History:  Ashana S Fink is here for a follow up office visit. About a month ago, Saranda went to her OBGYN and was found to have a ruptured ovarian cyst. Was in a lot of pain and taking tylenol everday. She is feeling better now and follows up with them later today. Weight wise, she is down 4 lbs. Pt not sure how she lost this weight. She thinks it maybe from walking more purposefully at work & drinking more water.   Pharmacotherapy for weight loss: She is currently taking  Zepbound 5 mg weekly .   Review of Systems:  Pertinent positives were addressed with patient today.  Reviewed by clinician on day of visit: allergies, medications, problem list, medical history, surgical history, family history, social history, and previous encounter  notes.  Weight Summary and Biometrics   Weight Lost Since Last Visit: 4 lb  Weight Gained Since Last Visit: 0    Vitals Temp: 97.6 F (36.4 C) BP: 125/85 Pulse Rate: 78 SpO2: 100 %   Anthropometric Measurements Height: 5\' 6"  (1.676 m) Weight: 166 lb (75.3 kg) BMI (Calculated): 26.81 Weight at Last Visit: 170 lb Weight Lost Since Last Visit: 4 lb Weight Gained Since Last Visit: 0 Starting Weight: 197 lb Total Weight Loss (lbs): 31 lb (14.1 kg) Peak Weight: 225 lb   Body Composition  Body Fat %: 34.1 % Fat Mass (lbs): 56.8 lbs Muscle Mass (lbs): 104.4 lbs Total Body Water (lbs): 71.4 lbs Visceral Fat Rating : 7   Other Clinical Data Fasting: Yes Today's Visit #: 13 Starting Date: 01/16/23   Objective:   PHYSICAL EXAM: Blood pressure 125/85, pulse 78, temperature 97.6 F (36.4 C), height 5\' 6"  (1.676 m), weight 166 lb (75.3 kg), SpO2 100%. Body mass index is 26.79 kg/m.  General: she is overweight, cooperative and in no acute distress. PSYCH: Has normal mood, affect and thought process.   HEENT: EOMI, sclerae are anicteric. Lungs: Normal breathing effort, no conversational dyspnea. Extremities: Moves * 4 Neurologic: A and O * 3, good insight  DIAGNOSTIC DATA REVIEWED: BMET    Component Value Date/Time   NA 141 09/13/2023 1444   K 4.1 09/13/2023 1444   CL 103 09/13/2023 1444   CO2 24 09/13/2023 1444   GLUCOSE 74 09/13/2023 1444   GLUCOSE 84 12/26/2022 0807   BUN 15 09/13/2023 1444   CREATININE 0.83 09/13/2023 1444   CALCIUM 9.4 09/13/2023 1444   GFRNONAA 67 07/30/2020 1051   GFRAA 77 07/30/2020 1051   Lab Results  Component Value Date   HGBA1C 5.2 09/13/2023   HGBA1C 5.3 02/24/2022   Lab Results  Component Value Date   INSULIN 9.7 09/13/2023   INSULIN 10.5 01/16/2023   Lab Results  Component Value Date   TSH 1.33 12/26/2022   CBC    Component Value Date/Time   WBC 6.6 12/26/2022 0807   RBC 4.85 12/26/2022 0807   HGB 14.8  12/26/2022 0807   HCT 44.0 12/26/2022 0807   PLT 249.0 12/26/2022 0807   MCV 90.8 12/26/2022 0807   MCH 29.8 11/11/2019 2237   MCHC 33.7 12/26/2022 0807   RDW 13.3 12/26/2022 0807   Iron Studies    Component Value Date/Time   IRON 129 12/17/2019 0926   FERRITIN 348 (H) 10/22/2019 0222   IRONPCTSAT 43.1 12/17/2019 0926   Lipid Panel     Component Value Date/Time   CHOL 220 (H) 09/13/2023 1444   TRIG 48 09/13/2023 1444   HDL 73 09/13/2023 1444   CHOLHDL 3.0 09/13/2023 1444   CHOLHDL 3 12/26/2022 0807   VLDL 15.8 12/26/2022 0807   LDLCALC 139 (H) 09/13/2023 1444   LDLDIRECT 164.5 09/19/2012 1052   Hepatic Function Panel     Component Value Date/Time   PROT 7.0 09/13/2023 1444   ALBUMIN 4.6 09/13/2023 1444   AST 19 09/13/2023 1444   ALT 15 09/13/2023 1444   ALKPHOS 79 09/13/2023 1444   BILITOT 0.3 09/13/2023 1444   BILIDIR 0.1 12/26/2022 0807   BILIDIR <0.10 11/02/2021 0847  Component Value Date/Time   TSH 1.33 12/26/2022 0807   Nutritional Lab Results  Component Value Date   VD25OH 44.0 09/13/2023   VD25OH 48.7 05/03/2023   VD25OH 14.37 (L) 12/26/2022    Attestations:   I, Special Puri, acting as a Stage manager for Thomasene Lot, DO., have compiled all relevant documentation for today's office visit on behalf of Thomasene Lot, DO, while in the presence of [employer], DO.  I have reviewed the above documentation for accuracy and completeness, and I agree with the above. [name], D.O.  The 21st Century Cures Act was signed into law in 2016 which includes the topic of electronic health records.  This provides immediate access to information in MyChart.  This includes consultation notes, operative notes, office notes, lab results and pathology reports.  If you have any questions about what you read please let us know at your next visit so we can discuss your concerns and take corrective action if need be.  We are right here with you.

## 2023-12-12 ENCOUNTER — Ambulatory Visit (INDEPENDENT_AMBULATORY_CARE_PROVIDER_SITE_OTHER): Payer: Commercial Managed Care - PPO | Admitting: Neurology

## 2023-12-12 DIAGNOSIS — G43709 Chronic migraine without aura, not intractable, without status migrainosus: Secondary | ICD-10-CM | POA: Diagnosis not present

## 2023-12-12 MED ORDER — ONABOTULINUMTOXINA 200 UNITS IJ SOLR
155.0000 [IU] | Freq: Once | INTRAMUSCULAR | Status: AC
  Administered 2023-12-12: 155 [IU] via INTRAMUSCULAR

## 2023-12-12 NOTE — Progress Notes (Signed)
 Botox- 200 units x 1 vial Lot: [id number] Expiration: 12/2025 NDC: 4540-9811-91  Bacteriostatic 0.9% Sodium Chloride- 4 mL  Lot: [id number] Expiration: 08/02/2024 NDC: 6213-0865-78  Dx: I69.629 S/P Witnessed by Idelle Leech RMA

## 2023-12-12 NOTE — Progress Notes (Signed)
   BOTOX PROCEDURE NOTE FOR MIGRAINE HEADACHE  HISTORY: Lauren Wolfe is here for Botox for me.  This will be her seventh injection.  Last was 09/05/2023 with me. Botox wears off few weeks before. She switched her IUD Mirena, ruptured ovarian cyst, having periods now. Last week had the worse migraine. Nurtec is working well.  Takes Nurtec PRN, about 3-5 migraine a month are less severe. Botox continues to work well.    Description of procedure:  The patient was placed in a sitting position. The standard protocol was used for Botox as follows, with 5 units of Botox injected at each site:   -Procerus muscle, midline injection  -Corrugator muscle, bilateral injection  -Frontalis muscle, bilateral injection, with 2 sites each side, medial injection was performed in the upper one third of the frontalis muscle, in the region vertical from the medial inferior edge of the superior orbital rim. The lateral injection was again in the upper one third of the forehead vertically above the lateral limbus of the cornea, 1.5 cm lateral to the medial injection site.  -Temporalis muscle injection, 4 sites, bilaterally. The first injection was 3 cm above the tragus of the ear, second injection site was 1.5 cm to 3 cm up from the first injection site in line with the tragus of the ear. The third injection site was 1.5-3 cm forward between the first 2 injection sites. The fourth injection site was 1.5 cm posterior to the second injection site.  -Occipitalis muscle injection, 3 sites, bilaterally. The first injection was done one half way between the occipital protuberance and the tip of the mastoid process behind the ear. The second injection site was done lateral and superior to the first, 1 fingerbreadth from the first injection. The third injection site was 1 fingerbreadth superiorly and medially from the first injection site.  -Cervical paraspinal muscle injection, 2 sites, bilateral, the first injection site  was 1 cm from the midline of the cervical spine, 3 cm inferior to the lower border of the occipital protuberance. The second injection site was 1.5 cm superiorly and laterally to the first injection site.  -Trapezius muscle injection was performed at 3 sites, bilaterally. The first injection site was in the upper trapezius muscle halfway between the inflection point of the neck, and the acromion. The second injection site was one half way between the acromion and the first injection site. The third injection was done between the first injection site and the inflection point of the neck.   A 200 unit bottle of Botox was used, 155 units were injected, the rest of the Botox was wasted. The patient tolerated the procedure well, there were no complications of the above procedure.  Botox NDC 4098-1191-47 Lot number [id number] Expiration date 12/2025 SP

## 2023-12-17 ENCOUNTER — Other Ambulatory Visit: Payer: Self-pay

## 2023-12-17 ENCOUNTER — Other Ambulatory Visit (HOSPITAL_COMMUNITY): Payer: Self-pay

## 2023-12-19 ENCOUNTER — Other Ambulatory Visit: Payer: Self-pay | Admitting: Neurology

## 2023-12-20 ENCOUNTER — Other Ambulatory Visit (HOSPITAL_COMMUNITY): Payer: Self-pay

## 2023-12-20 ENCOUNTER — Encounter: Payer: Self-pay | Admitting: Internal Medicine

## 2023-12-20 ENCOUNTER — Ambulatory Visit: Admitting: Internal Medicine

## 2023-12-20 VITALS — BP 124/72 | HR 64 | Temp 98.1°F | Ht 66.0 in | Wt 173.0 lb

## 2023-12-20 DIAGNOSIS — Z0001 Encounter for general adult medical examination with abnormal findings: Secondary | ICD-10-CM

## 2023-12-20 DIAGNOSIS — R7303 Prediabetes: Secondary | ICD-10-CM | POA: Diagnosis not present

## 2023-12-20 DIAGNOSIS — E559 Vitamin D deficiency, unspecified: Secondary | ICD-10-CM

## 2023-12-20 DIAGNOSIS — Z6831 Body mass index (BMI) 31.0-31.9, adult: Secondary | ICD-10-CM

## 2023-12-20 DIAGNOSIS — E66811 Obesity, class 1: Secondary | ICD-10-CM | POA: Diagnosis not present

## 2023-12-20 DIAGNOSIS — E7800 Pure hypercholesterolemia, unspecified: Secondary | ICD-10-CM

## 2023-12-20 DIAGNOSIS — R9431 Abnormal electrocardiogram [ECG] [EKG]: Secondary | ICD-10-CM | POA: Diagnosis not present

## 2023-12-20 DIAGNOSIS — R195 Other fecal abnormalities: Secondary | ICD-10-CM | POA: Insufficient documentation

## 2023-12-20 DIAGNOSIS — E6609 Other obesity due to excess calories: Secondary | ICD-10-CM

## 2023-12-20 DIAGNOSIS — I10 Essential (primary) hypertension: Secondary | ICD-10-CM | POA: Diagnosis not present

## 2023-12-20 LAB — CBC WITH DIFFERENTIAL/PLATELET
Basophils Absolute: 0 10*3/uL (ref 0.0–0.1)
Basophils Relative: 0.8 % (ref 0.0–3.0)
Eosinophils Absolute: 0 10*3/uL (ref 0.0–0.7)
Eosinophils Relative: 0.8 % (ref 0.0–5.0)
HCT: 46.2 % — ABNORMAL HIGH (ref 36.0–46.0)
Hemoglobin: 15.4 g/dL — ABNORMAL HIGH (ref 12.0–15.0)
Lymphocytes Relative: 34.8 % (ref 12.0–46.0)
Lymphs Abs: 1.7 10*3/uL (ref 0.7–4.0)
MCHC: 33.4 g/dL (ref 30.0–36.0)
MCV: 91.4 fl (ref 78.0–100.0)
Monocytes Absolute: 0.4 10*3/uL (ref 0.1–1.0)
Monocytes Relative: 7.9 % (ref 3.0–12.0)
Neutro Abs: 2.7 10*3/uL (ref 1.4–7.7)
Neutrophils Relative %: 55.7 % (ref 43.0–77.0)
Platelets: 237 10*3/uL (ref 150.0–400.0)
RBC: 5.05 Mil/uL (ref 3.87–5.11)
RDW: 13.1 % (ref 11.5–15.5)
WBC: 4.9 10*3/uL (ref 4.0–10.5)

## 2023-12-20 LAB — HEPATIC FUNCTION PANEL
ALT: 10 U/L (ref 0–35)
AST: 15 U/L (ref 0–37)
Albumin: 4.6 g/dL (ref 3.5–5.2)
Alkaline Phosphatase: 58 U/L (ref 39–117)
Bilirubin, Direct: 0.2 mg/dL (ref 0.0–0.3)
Total Bilirubin: 0.5 mg/dL (ref 0.2–1.2)
Total Protein: 7.5 g/dL (ref 6.0–8.3)

## 2023-12-20 LAB — URINALYSIS, ROUTINE W REFLEX MICROSCOPIC
Bilirubin Urine: NEGATIVE
Hgb urine dipstick: NEGATIVE
Ketones, ur: NEGATIVE
Leukocytes,Ua: NEGATIVE
Nitrite: NEGATIVE
Specific Gravity, Urine: 1.015 (ref 1.000–1.030)
Total Protein, Urine: NEGATIVE
Urine Glucose: NEGATIVE
Urobilinogen, UA: 0.2 (ref 0.0–1.0)
pH: 7 (ref 5.0–8.0)

## 2023-12-20 LAB — BASIC METABOLIC PANEL
BUN: 12 mg/dL (ref 6–23)
CO2: 30 meq/L (ref 19–32)
Calcium: 9.5 mg/dL (ref 8.4–10.5)
Chloride: 103 meq/L (ref 96–112)
Creatinine, Ser: 0.82 mg/dL (ref 0.40–1.20)
GFR: 85.11 mL/min (ref >60.00)
Glucose, Bld: 79 mg/dL (ref 70–99)
Potassium: 4 meq/L (ref 3.5–5.1)
Sodium: 138 meq/L (ref 135–145)

## 2023-12-20 LAB — LIPID PANEL
Cholesterol: 187 mg/dL (ref 0–200)
HDL: 69.9 mg/dL (ref >39.00)
LDL Cholesterol: 104 mg/dL — ABNORMAL HIGH (ref 0–99)
NonHDL: 117.05
Total CHOL/HDL Ratio: 3
Triglycerides: 67 mg/dL (ref 0.0–149.0)
VLDL: 13.4 mg/dL (ref 0.0–40.0)

## 2023-12-20 LAB — HEMOGLOBIN A1C: Hgb A1c MFr Bld: 5.3 % (ref 4.6–6.5)

## 2023-12-20 LAB — TSH: TSH: 0.99 u[IU]/mL (ref 0.35–5.50)

## 2023-12-20 MED ORDER — ZEPBOUND 5 MG/0.5ML ~~LOC~~ SOAJ
5.0000 mg | SUBCUTANEOUS | 11 refills | Status: DC
  Filled 2023-12-20 – 2024-01-13 (×2): qty 6, 84d supply, fill #0
  Filled 2024-01-14: qty 2, 28d supply, fill #0
  Filled 2024-02-07: qty 2, 28d supply, fill #1
  Filled 2024-03-05: qty 2, 28d supply, fill #2
  Filled 2024-06-11 – 2024-07-01 (×2): qty 2, 28d supply, fill #3

## 2023-12-20 MED ORDER — ZEPBOUND 5 MG/0.5ML ~~LOC~~ SOAJ
5.0000 mg | SUBCUTANEOUS | 11 refills | Status: DC
  Filled 2023-12-20: qty 6, 84d supply, fill #0

## 2023-12-20 NOTE — Progress Notes (Signed)
 Patient ID: Lauren Wolfe, female   DOB: November 30, 1975, 48 y.o.   MRN: [id number]         Chief Complaint:: wellness exam and low vit d, preDM, hld, htn, obesity       HPI:  Lauren Wolfe is a 48 y.o. female here for wellness exam; pt will call GYN for exam, o/w up to date                        Also taking miralax twice per wk for constipation.  Pt denies chest pain, increased sob or doe, wheezing, orthopnea, PND, increased LE swelling, palpitations, dizziness or syncope.   Pt denies polydipsia, polyuria, or new focal neuro s/s.    Pt denies fever, wt loss, night sweats, loss of appetite, or other constitutional symptoms  Willing for Card Ct score.  Unable to lose wt - hoping for zepbound start   Wt Readings from Last 3 Encounters:  12/20/23 173 lb (78.5 kg)  12/06/23 166 lb (75.3 kg)  10/24/23 170 lb (77.1 kg)   BP Readings from Last 3 Encounters:  12/20/23 124/72  12/06/23 125/85  10/24/23 (!) 145/83   Immunization History  Administered Date(s) Administered   Influenza Whole 07/21/2010   Influenza,inj,Quad PF,6+ Mos 08/21/2018   Influenza-Unspecified 07/05/2017, 08/19/2018   PFIZER(Purple Top)SARS-COV-2 Vaccination 12/10/2019, 01/07/2020, 09/24/2020   Td 07/21/2010   Tdap 01/30/2017   Unspecified SARS-COV-2 Vaccination 06/30/2020   Health Maintenance Due  Topic Date Due   Medicare Annual Wellness (AWV)  Never done   Cervical Cancer Screening (HPV/Pap Cotest)  03/19/2015      Past Medical History:  Diagnosis Date   Anxiety    Arm vein blood clot, right    Hyperlipidemia    Knee pain, left 01/2019   Migraine    Vitamin D deficiency    Wears glasses    and contacts   Past Surgical History:  Procedure Laterality Date   BREAST BIOPSY  10/31/2012   Procedure: BREAST BIOPSY WITH NEEDLE LOCALIZATION;  Surgeon: Emelia Loron, MD;  Location: [facility];  Service: General;  Laterality: Right;  right breast wire guided excisional biopsy   BREAST  EXCISIONAL BIOPSY Right 2014   ganglion cysts  2011   WRIST SURGERY Left     reports that she has never smoked. She has never used smokeless tobacco. She reports that she does not drink alcohol and does not use drugs. family history includes Aneurysm in her mother; Breast cancer in her cousin, maternal grandmother, and another family member; Diabetes in an other family member; Gout in an other family member; Hypertension in her mother and another family member; Migraines in her paternal aunt; Other in an other family member; Stomach cancer in her father; Stroke in her mother. Allergies  Allergen Reactions   Azithromycin Shortness Of Breath, Rash and Other (See Comments)    Prurutis   Celebrex [Celecoxib] Hives, Itching and Swelling    Swelling of hands and feet   Codeine Shortness Of Breath, Itching and Rash   Hydrocodone Shortness Of Breath, Itching and Rash   Ketorolac Tromethamine Shortness Of Breath, Itching and Rash   Prednisone Shortness Of Breath   Topiramate Shortness Of Breath   Lipitor [Atorvastatin] Other (See Comments)    MYALGIAS   Rosuvastatin Other (See Comments)    Body aches   Tramadol Itching   Current Outpatient Medications on File Prior to Visit  Medication Sig Dispense Refill  Alirocumab (PRALUENT) 150 MG/ML SOAJ Inject 1 mL (150 mg total) into the skin every 14 (fourteen) days. 2 mL 11   botulinum toxin Type A (BOTOX) 200 units injection Inject into head, neck and shoulder every three months 1 each 2   levonorgestrel (MIRENA) 20 MCG/24HR IUD 1 each by Intrauterine route once.     ondansetron (ZOFRAN-ODT) 4 MG disintegrating tablet TAKE 1 TABLET BY MOUTH EVERY 8 HOURS AS NEEDED FOR NAUSEA AND VOMITING 9 tablet 8   Rimegepant Sulfate (NURTEC) 75 MG TBDP Take 1 tablet (75 mg total) by mouth every other day. 16 tablet 11   SUMAtriptan 6 MG/0.5ML SOAJ Take 1 inj at onset of migraine.  May repeat in 2 hrs, if needed.  Max dose: 2 inj/day. This is a 30 day prescription.  6 mL 5   Vitamin D, Ergocalciferol, (DRISDOL) 1.25 MG (50000 UNIT) CAPS capsule Take 1 capsule (50,000 Units total) by mouth every 7 (seven) days. 12 capsule 0   tiZANidine (ZANAFLEX) 4 MG tablet TAKE 1 TABLET (4 MG TOTAL) BY MOUTH EVERY 8 (EIGHT) HOURS AS NEEDED (FOR MIGRAINES.). 30 tablet 1   Current Facility-Administered Medications on File Prior to Visit  Medication Dose Route Frequency Provider Last Rate Last Admin   botulinum toxin Type A (BOTOX) injection 155 Units  155 Units Intramuscular Q90 days Glean Salvo, NP   155 Units at 02/28/23 0847        ROS:  All others reviewed and negative.  Objective        PE:  BP 124/72 (BP Location: Right Arm, Patient Position: Sitting, Cuff Size: Normal)   Pulse 64   Temp 98.1 F (36.7 C) (Oral)   Ht 5\' 6"  (1.676 m)   Wt 173 lb (78.5 kg)   LMP  (LMP Unknown)   SpO2 99%   BMI 27.92 kg/m                 Constitutional: Pt appears in NAD               HENT: Head: NCAT.                Right Ear: External ear normal.                 Left Ear: External ear normal.                Eyes: . Pupils are equal, round, and reactive to light. Conjunctivae and EOM are normal               Nose: without d/c or deformity               Neck: Neck supple. Gross normal ROM               Cardiovascular: Normal rate and regular rhythm.                 Pulmonary/Chest: Effort normal and breath sounds without rales or wheezing.                Abd:  Soft, NT, ND, + BS, no organomegaly               Neurological: Pt is alert. At baseline orientation, motor grossly intact               Skin: Skin is warm. No rashes, no other new lesions, LE edema - none  Psychiatric: Pt behavior is normal without agitation   Micro: none  Cardiac tracings I have personally interpreted today:  none  Pertinent Radiological findings (summarize): none   Lab Results  Component Value Date   WBC 4.9 12/20/2023   HGB 15.4 (H) 12/20/2023   HCT 46.2 (H) 12/20/2023    PLT 237.0 12/20/2023   GLUCOSE 79 12/20/2023   CHOL 187 12/20/2023   TRIG 67.0 12/20/2023   HDL 69.90 12/20/2023   LDLDIRECT 164.5 09/19/2012   LDLCALC 104 (H) 12/20/2023   ALT 10 12/20/2023   AST 15 12/20/2023   NA 138 12/20/2023   K 4.0 12/20/2023   CL 103 12/20/2023   CREATININE 0.82 12/20/2023   BUN 12 12/20/2023   CO2 30 12/20/2023   TSH 0.99 12/20/2023   INR 1.0 11/11/2019   HGBA1C 5.3 12/20/2023   MICROALBUR 0.7 12/26/2022   Assessment/Plan:  Lauren Wolfe is a 48 y.o. Black or African American [2] female with  has a past medical history of Anxiety, Arm vein blood clot, right, Hyperlipidemia, Knee pain, left (01/2019), Migraine, Vitamin D deficiency, and Wears glasses.  Encounter for well adult exam with abnormal findings Age and sex appropriate education and counseling updated with regular exercise and diet Referrals for preventative services - pt will call GYN for exam Immunizations addressed - none needed Smoking counseling  - none needed Evidence for depression or other mood disorder - none significant Most recent labs reviewed. I have personally reviewed and have noted: 1) the patient's medical and social history 2) The patient's current medications and supplements 3) The patient's height, weight, and BMI have been recorded in the chart   Vitamin D deficiency Last vitamin D Lab Results  Component Value Date   VD25OH 44.0 09/13/2023   Stable, cont oral replacement   Prediabetes Lab Results  Component Value Date   HGBA1C 5.3 12/20/2023   Stable, pt to continue current medical treatment  - diet, wt control   HLD (hyperlipidemia) Lab Results  Component Value Date   LDLCALC 104 (H) 12/20/2023   Uncontrolled,, pt to continue current statin praluent 150 mg and lower chol diet, declines other change, also for cardiac CT score   Essential hypertension BP Readings from Last 3 Encounters:  12/20/23 124/72  12/06/23 125/85  10/24/23 (!) 145/83    Stable, pt to continue medical treatment  - diet, wt control   Class 1 obesity with serious comorbidity and body mass index (BMI) of 31.0 to 31.9 in adult Also for start zepbound 5 mg weekly,   Followup: Return in about 1 year (around 12/19/2024).  Oliver Barre, MD 12/22/2023 10:57 PM [facility]

## 2023-12-20 NOTE — Patient Instructions (Signed)
 Please continue all other medications as before, and refills have been done if requested - the zepbound  Please have the pharmacy call with any other refills you may need.  Please continue your efforts at being more active, low cholesterol diet, and weight control.  You are otherwise up to date with prevention measures today.  Please keep your appointments with your specialists as you may have planned  You will be contacted regarding the referral for: Cardiac Ct Score  Please go to the LAB at the blood drawing area for the tests to be done  You will be contacted by phone if any changes need to be made immediately.  Otherwise, you will receive a letter about your results with an explanation, but please check with MyChart first.  Please make an Appointment to return for your 1 year visit, or sooner if needed

## 2023-12-21 ENCOUNTER — Encounter: Payer: Self-pay | Admitting: Internal Medicine

## 2023-12-22 ENCOUNTER — Encounter: Payer: Self-pay | Admitting: Internal Medicine

## 2023-12-22 DIAGNOSIS — E66811 Obesity, class 1: Secondary | ICD-10-CM | POA: Insufficient documentation

## 2023-12-22 NOTE — Assessment & Plan Note (Signed)
 Lab Results  Component Value Date   HGBA1C 5.3 12/20/2023   Stable, pt to continue current medical treatment  - diet, wt control

## 2023-12-22 NOTE — Assessment & Plan Note (Signed)
 Age and sex appropriate education and counseling updated with regular exercise and diet Referrals for preventative services - pt will call GYN for exam Immunizations addressed - none needed Smoking counseling  - none needed Evidence for depression or other mood disorder - none significant Most recent labs reviewed. I have personally reviewed and have noted: 1) the patient's medical and social history 2) The patient's current medications and supplements 3) The patient's height, weight, and BMI have been recorded in the chart

## 2023-12-22 NOTE — Assessment & Plan Note (Addendum)
 Lab Results  Component Value Date   LDLCALC 104 (H) 12/20/2023   Uncontrolled,, pt to continue current statin praluent 150 mg and lower chol diet, declines other change, also for cardiac CT score

## 2023-12-22 NOTE — Assessment & Plan Note (Signed)
 Last vitamin D Lab Results  Component Value Date   VD25OH 44.0 09/13/2023   Stable, cont oral replacement

## 2023-12-22 NOTE — Assessment & Plan Note (Signed)
 BP Readings from Last 3 Encounters:  12/20/23 124/72  12/06/23 125/85  10/24/23 (!) 145/83   Stable, pt to continue medical treatment  - diet, wt control

## 2023-12-22 NOTE — Assessment & Plan Note (Signed)
 Also for start zepbound 5 mg weekly,

## 2023-12-31 ENCOUNTER — Ambulatory Visit (HOSPITAL_BASED_OUTPATIENT_CLINIC_OR_DEPARTMENT_OTHER)
Admission: RE | Admit: 2023-12-31 | Discharge: 2023-12-31 | Disposition: A | Discharge status: Home / Self Care | Payer: Self-pay | Source: Ambulatory Visit | Attending: Internal Medicine | Admitting: Internal Medicine

## 2023-12-31 DIAGNOSIS — E7800 Pure hypercholesterolemia, unspecified: Secondary | ICD-10-CM

## 2023-12-31 DIAGNOSIS — I10 Essential (primary) hypertension: Secondary | ICD-10-CM

## 2023-12-31 DIAGNOSIS — R7303 Prediabetes: Secondary | ICD-10-CM

## 2023-12-31 DIAGNOSIS — R9431 Abnormal electrocardiogram [ECG] [EKG]: Secondary | ICD-10-CM

## 2024-01-02 ENCOUNTER — Encounter: Payer: Self-pay | Admitting: Internal Medicine

## 2024-01-03 ENCOUNTER — Telehealth: Payer: Self-pay | Admitting: Cardiovascular Disease

## 2024-01-03 NOTE — Telephone Encounter (Signed)
 Hi April, I haven't spoken to the patient. This may have been meant for someone else.

## 2024-01-03 NOTE — Telephone Encounter (Signed)
 Pt is returning a call to you stating that she just had a cardiac ct scoring and was told that it has the same thing that the requesting labs give and is requesting cb

## 2024-01-04 ENCOUNTER — Ambulatory Visit: Payer: Commercial Managed Care - PPO

## 2024-01-13 ENCOUNTER — Encounter: Payer: Self-pay | Admitting: Cardiovascular Disease

## 2024-01-14 ENCOUNTER — Other Ambulatory Visit: Payer: Self-pay

## 2024-01-14 ENCOUNTER — Other Ambulatory Visit (HOSPITAL_COMMUNITY): Payer: Self-pay

## 2024-01-17 ENCOUNTER — Encounter (INDEPENDENT_AMBULATORY_CARE_PROVIDER_SITE_OTHER): Payer: Self-pay | Admitting: Family Medicine

## 2024-01-17 ENCOUNTER — Ambulatory Visit (INDEPENDENT_AMBULATORY_CARE_PROVIDER_SITE_OTHER): Admitting: Family Medicine

## 2024-01-17 VITALS — BP 125/82 | HR 74 | Temp 98.2°F | Ht 66.0 in | Wt 171.0 lb

## 2024-01-17 DIAGNOSIS — E6609 Other obesity due to excess calories: Secondary | ICD-10-CM

## 2024-01-17 DIAGNOSIS — E559 Vitamin D deficiency, unspecified: Secondary | ICD-10-CM

## 2024-01-17 DIAGNOSIS — R7303 Prediabetes: Secondary | ICD-10-CM

## 2024-01-17 DIAGNOSIS — E66811 Obesity, class 1: Secondary | ICD-10-CM

## 2024-01-17 DIAGNOSIS — E7849 Other hyperlipidemia: Secondary | ICD-10-CM | POA: Diagnosis not present

## 2024-01-17 DIAGNOSIS — Z6827 Body mass index (BMI) 27.0-27.9, adult: Secondary | ICD-10-CM

## 2024-01-17 NOTE — Progress Notes (Signed)
 Lauren Wolfe, D.O.  ABFM, ABOM Specializing in Clinical Bariatric Medicine  Office located at: [address] [place], [place]  [address]   Assessment and Plan:   Orders Placed This Encounter  Procedures   VITAMIN D  25 Hydroxy (Vit-D Deficiency, Fractures)   FOR THE DISEASE OF OBESITY:  BMI 27.0-27.9,adult- current 27.61 Class 1 obesity due to excess calories without serious comorbidity with body mass index (BMI) of 31.0 to 31.9 in adult , start BMI 31.62 Assessment & Plan: Since last office visit on 12/06/2023 patient's  Muscle mass has decreased by 1.2 lb. Fat mass has increased by 5.8 lb. Total body water has increased by 3.6 lb.  Counseling done on how various foods will affect these numbers and how to maximize success  Total lbs lost to date: 26 lbs  Total weight loss percentage to date: 13.20%    Recommended Dietary Goals Lauren Wolfe is currently in the action stage of change. As such, her goal is to continue weight management plan.  She has agreed to: continue current plan   Behavioral Intervention We discussed the following today: continue to work on maintaining a reduced calorie state, getting the recommended amount of protein, making healthy choices, staying well hydrated and celebration eating strategies.   Additional resources provided today: None  Evidence-based interventions for health behavior change were utilized today including the discussion of self monitoring techniques, problem-solving barriers and SMART goal setting techniques.   Regarding patient's less desirable eating habits and patterns, we employed the technique of small changes.   Pt will specifically work on: n/a   Recommended Physical Activity Goals Lauren Wolfe has been advised to work up to 300-450 minutes of moderate intensity aerobic activity a week and strengthening exercises 2-3 times per week for cardiovascular health, weight loss maintenance and preservation of muscle mass.   She  has agreed to : add weight lifting 3 days a week.    Pharmacotherapy Pt endorses having some increased hunger and cravings in the past 2 weeks. She does not wish to increase her Zepbound  dose. Pt will work on increasing her protein intake for hunger and craving suppression and reducing simple and added sugars in her diet.   FOR ASSOCIATED CONDITIONS ADDRESSED TODAY:  Prediabetes Assessment & Plan: Lab Results  Component Value Date   HGBA1C 5.3 12/20/2023   HGBA1C 5.2 09/13/2023   HGBA1C 5.3 05/03/2023   INSULIN  9.7 09/13/2023   INSULIN  10.3 05/03/2023   INSULIN  10.5 01/16/2023   Lab Results  Component Value Date   CREATININE 0.82 12/20/2023   BUN 12 12/20/2023   NA 138 12/20/2023   K 4.0 12/20/2023   CL 103 12/20/2023   CO2 30 12/20/2023      Component Value Date/Time   PROT 7.5 12/20/2023 0852   PROT 7.0 09/13/2023 1444   ALBUMIN 4.6 12/20/2023 0852   ALBUMIN 4.6 09/13/2023 1444   AST 15 12/20/2023 0852   ALT 10 12/20/2023 0852   ALKPHOS 58 12/20/2023 0852   BILITOT 0.5 12/20/2023 0852   BILITOT 0.3 09/13/2023 1444   BILIDIR 0.2 12/20/2023 0852   BILIDIR <0.10 11/02/2021 0847   Lab Results  Component Value Date   TSH 0.99 12/20/2023    Pt requests that I review labs obtained with PCP on 12/20/2023. Her Hemoglobin A1c, liver enzymes, kidney function, electrolytes, and TSH are within recommended limits. Continue to decrease simple carbs/ sugars; increase fiber and proteins -> follow her meal plan.     Other hyperlipidemia Assessment &  Plan: Lab Results  Component Value Date   CHOL 187 12/20/2023   HDL 69.90 12/20/2023   LDLCALC 104 (H) 12/20/2023   LDLDIRECT 164.5 09/19/2012   TRIG 67.0 12/20/2023   CHOLHDL 3 12/20/2023   Around 1/61, pt was placed on Alirocumab  150 mg every 14 days per her cardiologist Dr.Kelly. Pt requests that I review lipid panel done on 12/20/2023. Her LDL is slightly elevated at 104. Her HDL slightly decreased from 73 to 69.90.  Additionally, pt had a CT cardiac scan on 12/31/2023; there was no extra cardiac findings and her coronary calcium  score was 0.   Continue same regimen. Pt advised to reduce saturated and trans fats in diet. Discussed that increasing exercise and consuming healthy fats can raise HDL. She agrees to start wt lifting.    Vitamin D  deficiency Assessment & Plan: Lab Results  Component Value Date   VD25OH 44.0 09/13/2023   VD25OH 48.7 05/03/2023   VD25OH 14.37 (L) 12/26/2022   Most recent Vitamin D  as above. Currently on ergocalciferol  50,000 units weekly without any adverse effects such as nausea, vomiting or muscle weakness. Continue vitamin D  supplementation - recheck levels today.   Follow up:   Return 02/21/2024. She was informed of the importance of frequent follow up visits to maximize her success with intensive lifestyle modifications for her multiple health conditions.  Lauren Wolfe is aware that we will review all of her lab results at our next visit.  She is aware that if anything is critical/ life threatening with the results, we will be contacting her via MyChart prior to the office visit to discuss management.    Subjective:   Chief complaint: Obesity Lauren Wolfe is here to discuss her progress with her obesity treatment plan. She is keeping a food journal and adhering to recommended goals of 1050-1150 calories and 80+ g of protein and the Pescatarian Plan. States she is following her eating plan approximately 50% of the time. She states she is walking 60 minutes 3 days per week.  Interval History:  Lauren Wolfe is here for a follow up office visit. Since last OV on 12/06/2023, Lauren Wolfe is up 5 lbs. Pt is not frustrated by this because she prefers to be ~ 170 lbs. Food wise, she acknowledges deviating from her meal plan for lunch. Is eating out more for lunch. Denies skipping meals.   Pharmacotherapy for weight loss: She is currently taking Zepbound  5 mg weekly.  Review  of Systems:  Pertinent positives were addressed with patient today.  Reviewed by clinician on day of visit: allergies, medications, problem list, medical history, surgical history, family history, social history, and previous encounter notes.  Weight Summary and Biometrics   Weight Lost Since Last Visit: 0  Weight Gained Since Last Visit: 5lb    Vitals Temp: 98.2 F (36.8 C) BP: 125/82 Pulse Rate: 74 SpO2: 99 %   Anthropometric Measurements Height: 5\' 6"  (1.676 m) Weight: 171 lb (77.6 kg) BMI (Calculated): 27.61 Weight at Last Visit: 166lb Weight Lost Since Last Visit: 0 Weight Gained Since Last Visit: 5lb Starting Weight: 197lb Total Weight Loss (lbs): 26 lb (11.8 kg) Peak Weight: 225lb   Body Composition  Body Fat %: 36.5 % Fat Mass (lbs): 62.6 lbs Muscle Mass (lbs): 103.2 lbs Total Body Water (lbs): 75 lbs Visceral Fat Rating : 7   Other Clinical Data Fasting: Yes Labs: no Today's Visit #: 14 Starting Date: 01/16/23   Objective:   PHYSICAL EXAM: Blood pressure 125/82, pulse  74, temperature 98.2 F (36.8 C), height 5\' 6"  (1.676 m), weight 171 lb (77.6 kg), SpO2 99%. Body mass index is 27.6 kg/m.  General: she is overweight, cooperative and in no acute distress. PSYCH: Has normal mood, affect and thought process.   HEENT: EOMI, sclerae are anicteric. Lungs: Normal breathing effort, no conversational dyspnea. Extremities: Moves * 4 Neurologic: A and O * 3, good insight  DIAGNOSTIC DATA REVIEWED: BMET    Component Value Date/Time   NA 138 12/20/2023 0852   NA 141 09/13/2023 1444   K 4.0 12/20/2023 0852   CL 103 12/20/2023 0852   CO2 30 12/20/2023 0852   GLUCOSE 79 12/20/2023 0852   BUN 12 12/20/2023 0852   BUN 15 09/13/2023 1444   CREATININE 0.82 12/20/2023 0852   CALCIUM  9.5 12/20/2023 0852   GFRNONAA 67 07/30/2020 1051   GFRAA 77 07/30/2020 1051   Lab Results  Component Value Date   HGBA1C 5.3 12/20/2023   HGBA1C 5.3 02/24/2022    Lab Results  Component Value Date   INSULIN  9.7 09/13/2023   INSULIN  10.5 01/16/2023   Lab Results  Component Value Date   TSH 0.99 12/20/2023   CBC    Component Value Date/Time   WBC 4.9 12/20/2023 0852   RBC 5.05 12/20/2023 0852   HGB 15.4 (H) 12/20/2023 0852   HCT 46.2 (H) 12/20/2023 0852   PLT 237.0 12/20/2023 0852   MCV 91.4 12/20/2023 0852   MCH 29.8 11/11/2019 2237   MCHC 33.4 12/20/2023 0852   RDW 13.1 12/20/2023 0852   Iron Studies    Component Value Date/Time   IRON 129 12/17/2019 0926   FERRITIN 348 (H) 10/22/2019 0222   IRONPCTSAT 43.1 12/17/2019 0926   Lipid Panel     Component Value Date/Time   CHOL 187 12/20/2023 0852   CHOL 220 (H) 09/13/2023 1444   TRIG 67.0 12/20/2023 0852   HDL 69.90 12/20/2023 0852   HDL 73 09/13/2023 1444   CHOLHDL 3 12/20/2023 0852   VLDL 13.4 12/20/2023 0852   LDLCALC 104 (H) 12/20/2023 0852   LDLCALC 139 (H) 09/13/2023 1444   LDLDIRECT 164.5 09/19/2012 1052   Hepatic Function Panel     Component Value Date/Time   PROT 7.5 12/20/2023 0852   PROT 7.0 09/13/2023 1444   ALBUMIN 4.6 12/20/2023 0852   ALBUMIN 4.6 09/13/2023 1444   AST 15 12/20/2023 0852   ALT 10 12/20/2023 0852   ALKPHOS 58 12/20/2023 0852   BILITOT 0.5 12/20/2023 0852   BILITOT 0.3 09/13/2023 1444   BILIDIR 0.2 12/20/2023 0852   BILIDIR <0.10 11/02/2021 0847      Component Value Date/Time   TSH 0.99 12/20/2023 0852   Nutritional Lab Results  Component Value Date   VD25OH 44.0 09/13/2023   VD25OH 48.7 05/03/2023   VD25OH 14.37 (L) 12/26/2022    Attestations:   I, Special Puri, acting as a Stage manager for Lauren Sensor, DO., have compiled all relevant documentation for today's office visit on behalf of Lauren Sensor, DO, while in the presence of [employer], DO.  Reviewed by clinician on day of visit: allergies, medications, problem list, medical history, surgical history, family history, social history, and previous encounter  notes pertinent to patient's obesity diagnosis.  I have reviewed the above documentation for accuracy and completeness, and I agree with the above. Lauren Wolfe, D.O.  The 21st Century Cures Act was signed into law in 2016 which includes the topic of electronic health records.  This provides immediate access to information in MyChart.  This includes consultation notes, operative notes, office notes, lab results and pathology reports.  If you have any questions about what you read please let us  know at your next visit so we can discuss your concerns and take corrective action if need be.  We are right here with you.

## 2024-01-18 LAB — VITAMIN D 25 HYDROXY (VIT D DEFICIENCY, FRACTURES): Vit D, 25-Hydroxy: 48 ng/mL (ref 30.0–100.0)

## 2024-01-21 ENCOUNTER — Encounter (INDEPENDENT_AMBULATORY_CARE_PROVIDER_SITE_OTHER): Payer: Self-pay | Admitting: Family Medicine

## 2024-02-06 ENCOUNTER — Encounter: Payer: Self-pay | Admitting: Neurology

## 2024-02-06 ENCOUNTER — Encounter: Payer: Self-pay | Admitting: Internal Medicine

## 2024-02-06 DIAGNOSIS — Z0289 Encounter for other administrative examinations: Secondary | ICD-10-CM

## 2024-02-07 ENCOUNTER — Other Ambulatory Visit: Payer: Self-pay

## 2024-02-07 ENCOUNTER — Telehealth: Payer: Self-pay

## 2024-02-07 ENCOUNTER — Other Ambulatory Visit (HOSPITAL_COMMUNITY): Payer: Self-pay

## 2024-02-07 MED ORDER — WEGOVY 0.25 MG/0.5ML ~~LOC~~ SOAJ
0.2500 mg | SUBCUTANEOUS | 11 refills | Status: DC
  Filled 2024-02-07 – 2024-03-31 (×2): qty 2, 28d supply, fill #0
  Filled 2024-04-23: qty 2, 28d supply, fill #1

## 2024-02-07 MED ORDER — SEMAGLUTIDE-WEIGHT MANAGEMENT 0.25 MG/0.5ML ~~LOC~~ SOAJ: 0.2500 mg | SUBCUTANEOUS | 11 refills | Status: DC

## 2024-02-07 MED ORDER — WEGOVY 0.25 MG/0.5ML ~~LOC~~ SOAJ: 0.2500 mg | SUBCUTANEOUS | 11 refills | Status: DC

## 2024-02-07 NOTE — Telephone Encounter (Signed)
 Pharmacy Patient Advocate Encounter   Received notification from Patient Pharmacy that prior authorization for Wegovy  is required/requested.   Insurance verification completed.   The patient is insured through [facility] ADVANTAGE/RX ADVANCE .   Per test claim: PA required; PA submitted to above mentioned insurance via CoverMyMeds Key/confirmation #/EOC [facility] Status is pending

## 2024-02-13 NOTE — Telephone Encounter (Signed)
 Paperwork faxed to [phone]

## 2024-02-18 ENCOUNTER — Other Ambulatory Visit (HOSPITAL_COMMUNITY): Payer: Self-pay

## 2024-02-19 ENCOUNTER — Other Ambulatory Visit (HOSPITAL_COMMUNITY): Payer: Self-pay

## 2024-02-19 NOTE — Telephone Encounter (Signed)
 Pharmacy Patient Advocate Encounter  Received notification from [employer] that Prior Authorization for Wegovy  0.25 has been APPROVED from 02/19/24 to 09/05/24. Ran test claim, Copay is $0.00. This test claim was processed through [facility]- copay amounts may vary at other pharmacies due to pharmacy/plan contracts, or as the patient moves through the different stages of their insurance plan.   PA #/Case ID/Reference #: [id number]

## 2024-02-21 ENCOUNTER — Encounter: Payer: Self-pay | Admitting: Neurology

## 2024-02-21 ENCOUNTER — Ambulatory Visit (INDEPENDENT_AMBULATORY_CARE_PROVIDER_SITE_OTHER): Admitting: Family Medicine

## 2024-02-27 ENCOUNTER — Other Ambulatory Visit (HOSPITAL_COMMUNITY): Payer: Self-pay

## 2024-02-27 ENCOUNTER — Telehealth: Payer: Self-pay

## 2024-02-27 ENCOUNTER — Encounter: Payer: Self-pay | Admitting: Cardiovascular Disease

## 2024-02-27 ENCOUNTER — Telehealth (INDEPENDENT_AMBULATORY_CARE_PROVIDER_SITE_OTHER): Admitting: Neurology

## 2024-02-27 DIAGNOSIS — G43709 Chronic migraine without aura, not intractable, without status migrainosus: Secondary | ICD-10-CM | POA: Diagnosis not present

## 2024-02-27 MED ORDER — UBRELVY 100 MG PO TABS: 100.0000 mg | ORAL_TABLET | ORAL | 11 refills | Status: AC | PRN

## 2024-02-27 NOTE — Patient Instructions (Signed)
 Take Nurtec every other day for migraine prevention.  Try Ubrelvy 100 mg as needed for acute migraine headache.  May repeat in 2 hours if needed.  Max 200 mg daily.  Keep next follow-up appointment.  Feel free to reach out if migraines continue.  Thanks!!

## 2024-02-27 NOTE — Telephone Encounter (Signed)
 Pharmacy Patient Advocate Encounter  Received notification from [facility] that Prior Authorization for Ubrelvy  100MG  tablets has been APPROVED from 02/27/2024 to 02/26/2025. Ran test claim, Copay is $0. This test claim was processed through [facility]- copay amounts may vary at other pharmacies due to pharmacy/plan contracts, or as the patient moves through the different stages of their insurance plan.   PA #/Case ID/Reference #: PA Case ID #: [id number]

## 2024-02-27 NOTE — Telephone Encounter (Signed)
 Pharmacy Patient Advocate Encounter   Received notification from CoverMyMeds that prior authorization for Ubrelvy 100MG  tablets is required/requested.   Insurance verification completed.   The patient is insured through [facility] .   Per test claim: PA required; PA submitted to above mentioned insurance via CoverMyMeds Key/confirmation #/EOC [id number] Status is pending
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
## 2024-03-11 ENCOUNTER — Ambulatory Visit: Admitting: Neurology

## 2024-03-12 ENCOUNTER — Other Ambulatory Visit: Payer: Self-pay

## 2024-03-12 ENCOUNTER — Other Ambulatory Visit: Payer: Self-pay | Admitting: Pharmacy Technician

## 2024-03-12 NOTE — Progress Notes (Signed)
 Specialty Pharmacy Refill Coordination Note  Lauren Wolfe is a 48 y.o. female assessed today regarding refills of clinic administered specialty medication(s) OnabotulinumtoxinA  (Botox )   Clinic requested Courier to Provider Office   Delivery date: 03/13/24   Verified address: [facility] [address] [place], [place]   Medication will be filled on 03/12/24.  Appt 03/19/24

## 2024-03-18 ENCOUNTER — Ambulatory Visit (INDEPENDENT_AMBULATORY_CARE_PROVIDER_SITE_OTHER): Admitting: Family Medicine

## 2024-03-18 ENCOUNTER — Encounter (INDEPENDENT_AMBULATORY_CARE_PROVIDER_SITE_OTHER): Payer: Self-pay | Admitting: Family Medicine

## 2024-03-18 VITALS — BP 140/81 | HR 77 | Temp 98.5°F | Ht 66.0 in | Wt 167.0 lb

## 2024-03-18 DIAGNOSIS — E7849 Other hyperlipidemia: Secondary | ICD-10-CM

## 2024-03-18 DIAGNOSIS — K2900 Acute gastritis without bleeding: Secondary | ICD-10-CM

## 2024-03-18 DIAGNOSIS — E66811 Obesity, class 1: Secondary | ICD-10-CM

## 2024-03-18 DIAGNOSIS — R7303 Prediabetes: Secondary | ICD-10-CM

## 2024-03-18 DIAGNOSIS — E559 Vitamin D deficiency, unspecified: Secondary | ICD-10-CM | POA: Diagnosis not present

## 2024-03-18 DIAGNOSIS — Z6826 Body mass index (BMI) 26.0-26.9, adult: Secondary | ICD-10-CM

## 2024-03-18 DIAGNOSIS — Z6827 Body mass index (BMI) 27.0-27.9, adult: Secondary | ICD-10-CM

## 2024-03-18 NOTE — Progress Notes (Incomplete)
 Lauren Wolfe, D.O.  ABFM, ABOM Specializing in Clinical Bariatric Medicine  Office located at: [address] [place], [place]  [address]   Assessment and Plan:  No orders of the defined types were placed in this encounter.  There are no discontinued medications.   No orders of the defined types were placed in this encounter.    FOR THE DISEASE OF OBESITY:  BMI 27.0-27.9,adult- current 26.97 Class 1 obesity due to excess calories without serious comorbidity with body mass index (BMI) of 31.0 to 31.9 in adult , start BMI 31.62 Assessment & Plan: Since last office visit on 01/17/2024 patient's  Muscle mass has not changed.  Fat mass has decreased by 3.8 lb. Total body water has not changed. Counseling done on how various foods will affect these numbers and how to maximize success  Total lbs lost to date: 30 lbs  Total weight loss percentage to date: 15.23%    Recommended Dietary Goals Lauren Wolfe is currently in the action stage of change. As such, her goal is to continue weight management plan.  She has agreed to: continue current plan   Behavioral Intervention We discussed the following today: continue to work on maintaining a reduced calorie state, getting the recommended amount of protein, incorporating whole foods, making healthy choices, staying well hydrated and practicing mindfulness when eating.  Additional resources provided today: None  Evidence-based interventions for health behavior change were utilized today including the discussion of self monitoring techniques, problem-solving barriers and SMART goal setting techniques.   Regarding patient's less desirable eating habits and patterns, we employed the technique of small changes.   Pt will specifically work on: n/a   Recommended Physical Activity Goals Lauren Wolfe has been advised to work up to 300-450 minutes of moderate intensity aerobic activity a week and strengthening exercises 2-3 times per week for  cardiovascular health, weight loss maintenance and preservation of muscle mass.   She has agreed to : continue to gradually increase the amount and intensity of exercise routine   Pharmacotherapy See Pre-DM & acute gastritis note.    ASSOCIATED CONDITIONS ADDRESSED TODAY:  Other acute gastritis without hemorrhage Assessment & Plan:     Prior to this protein focoused.  Sick last week didn't do well - took all her vitamins at once. GI upset. Complaining of  Last week  From taking all her vitamins at once on an empty stomach.  Experienced GI upset (nausea and vomiting)  For 2 days could not even drink water  Has been taking Zofran  helping with neausea.   Been eating egg whites,   Plan: stay away from spicy, fatty/friedn foods, and all dairy foods.  Once symptom free for 2-4 days, can gradually incoroprate foods back.  Consider Pepcid  AC    Other hyperlipidemia Assessment & Plan: Lab Results  Component Value Date   CHOL 187 12/20/2023   HDL 69.90 12/20/2023   LDLCALC 104 (H) 12/20/2023   LDLDIRECT 164.5 09/19/2012   TRIG 67.0 12/20/2023   CHOLHDL 3 12/20/2023    Has been on Praluent  150 mg every 14 days per cardiology since the end of March or so. Continue regimen and low cholesterol meal plan. Reminded pt to f/up with cardiology for repeat labs.     Prediabetes Assessment & Plan: Lab Results  Component Value Date   HGBA1C 5.3 12/20/2023   HGBA1C 5.2 09/13/2023   HGBA1C 5.3 05/03/2023   INSULIN  9.7 09/13/2023   INSULIN  10.3 05/03/2023   INSULIN  10.5 01/16/2023  Blood pressure above goal - states stressed. Asx.   Put Wegovy  and Zepound note under hear.   Do not take Wegovy  until stomach issues resolve for at least a week  noted above are resolved   *** Her insurance stopped zepobund coverage so she will be taking Wegovy  and will be starting it July 1st  per her PCP. Still has some zepbound  left but will take Wegovy  in July 1st.    Diet/lifestyle  approach. Her hunger and cravings are controlled. Continue  balanced diet focusing on protein, fruits, and vegetables while limiting simple carbohydrates.    Vitamin D  deficiency Assessment & Plan: Lab Results  Component Value Date   VD25OH 48.0 01/17/2024   VD25OH 44.0 09/13/2023   VD25OH 48.7 05/03/2023   Her VD is at goal. Continue ergocalciferol  50,000 units wkly; continue regimen and weight loss efforts.     Follow up:   Return 04/15/2024 at 8:40 AM. She was informed of the importance of frequent follow up visits to maximize her success with intensive lifestyle modifications for her multiple health conditions.   Subjective:   Chief complaint: Obesity Lauren Wolfe is here to discuss her progress with her obesity treatment plan. She is keeping a food journal and adhering to recommended goals of 1050-1150 calories and 80+ g of protein and the Pescatarian Plan and states she is following her eating plan approximately 50% of the time. She states she is walking 60 minutes 4 days per week.  Interval History:  Lauren Wolfe is here for a follow up office visit. Since last OV on 01/17/2024, Lauren Wolfe is down 4 lbs.        Pharmacotherapy for weight loss: She is currently taking {EMPharmaco:[id number]}.   Review of Systems:  Pertinent positives were addressed with patient today.  Reviewed by clinician on day of visit: allergies, medications, problem list, medical history, surgical history, family history, social history, and previous encounter notes.  Weight Summary and Biometrics   Weight Lost Since Last Visit: 4lb  Weight Gained Since Last Visit: 0lb  Vitals Temp: 98.5 F (36.9 C) BP: (!) 140/81 Pulse Rate: 77 SpO2: 100 %   Anthropometric Measurements Height: 5' 6 (1.676 m) Weight: 167 lb (75.8 kg) BMI (Calculated): 26.97 Weight at Last Visit: 171lb Weight Lost Since Last Visit: 4lb Weight Gained Since Last Visit: 0lb Starting Weight: 197lb Total Weight Loss  (lbs): 30 lb (13.6 kg)   Body Composition  Body Fat %: 35.1 % Fat Mass (lbs): 58.8 lbs Muscle Mass (lbs): 103.2 lbs Total Body Water (lbs): 75 lbs Visceral Fat Rating : 7   Other Clinical Data Fasting: No Labs: No Today's Visit #: 15 Starting Date: 01/16/23   Objective:   PHYSICAL EXAM: Blood pressure (!) 140/81, pulse 77, temperature 98.5 F (36.9 C), height 5' 6 (1.676 m), weight 167 lb (75.8 kg), SpO2 100%. Body mass index is 26.95 kg/m.  General: she is overweight, cooperative and in no acute distress. PSYCH: Has normal mood, affect and thought process.   HEENT: EOMI, sclerae are anicteric. Lungs: Normal breathing effort, no conversational dyspnea. Extremities: Moves * 4 Neurologic: A and O * 3, good insight  DIAGNOSTIC DATA REVIEWED: BMET    Component Value Date/Time   NA 138 12/20/2023 0852   NA 141 09/13/2023 1444   K 4.0 12/20/2023 0852   CL 103 12/20/2023 0852   CO2 30 12/20/2023 0852   GLUCOSE 79 12/20/2023 0852   BUN 12 12/20/2023 0852   BUN 15 09/13/2023 1444  CREATININE 0.82 12/20/2023 0852   CALCIUM  9.5 12/20/2023 0852   GFRNONAA 67 07/30/2020 1051   GFRAA 77 07/30/2020 1051   Lab Results  Component Value Date   HGBA1C 5.3 12/20/2023   HGBA1C 5.3 02/24/2022   Lab Results  Component Value Date   INSULIN  9.7 09/13/2023   INSULIN  10.5 01/16/2023   Lab Results  Component Value Date   TSH 0.99 12/20/2023   CBC    Component Value Date/Time   WBC 4.9 12/20/2023 0852   RBC 5.05 12/20/2023 0852   HGB 15.4 (H) 12/20/2023 0852   HCT 46.2 (H) 12/20/2023 0852   PLT 237.0 12/20/2023 0852   MCV 91.4 12/20/2023 0852   MCH 29.8 11/11/2019 2237   MCHC 33.4 12/20/2023 0852   RDW 13.1 12/20/2023 0852   Iron Studies    Component Value Date/Time   IRON 129 12/17/2019 0926   FERRITIN 348 (H) 10/22/2019 0222   IRONPCTSAT 43.1 12/17/2019 0926   Lipid Panel     Component Value Date/Time   CHOL 187 12/20/2023 0852   CHOL 220 (H) 09/13/2023  1444   TRIG 67.0 12/20/2023 0852   HDL 69.90 12/20/2023 0852   HDL 73 09/13/2023 1444   CHOLHDL 3 12/20/2023 0852   VLDL 13.4 12/20/2023 0852   LDLCALC 104 (H) 12/20/2023 0852   LDLCALC 139 (H) 09/13/2023 1444   LDLDIRECT 164.5 09/19/2012 1052   Hepatic Function Panel     Component Value Date/Time   PROT 7.5 12/20/2023 0852   PROT 7.0 09/13/2023 1444   ALBUMIN 4.6 12/20/2023 0852   ALBUMIN 4.6 09/13/2023 1444   AST 15 12/20/2023 0852   ALT 10 12/20/2023 0852   ALKPHOS 58 12/20/2023 0852   BILITOT 0.5 12/20/2023 0852   BILITOT 0.3 09/13/2023 1444   BILIDIR 0.2 12/20/2023 0852   BILIDIR <0.10 11/02/2021 0847      Component Value Date/Time   TSH 0.99 12/20/2023 0852   Nutritional Lab Results  Component Value Date   VD25OH 48.0 01/17/2024   VD25OH 44.0 09/13/2023   VD25OH 48.7 05/03/2023    Attestations:   I, Lauren Wolfe, acting as a Stage manager for [employer], DO., have compiled all relevant documentation for today's office visit on behalf of Lauren Sensor, DO, while in the presence of [employer], DO.  I have reviewed the above documentation for accuracy and completeness, and I agree with the above. Lauren Wolfe, D.O.  The 21st Century Cures Act was signed into law in 2016 which includes the topic of electronic health records.  This provides immediate access to information in MyChart.  This includes consultation notes, operative notes, office notes, lab results and pathology reports.  If you have any questions about what you read please let us  know at your next visit so we can discuss your concerns and take corrective action if need be.  We are right here with you.

## 2024-03-19 ENCOUNTER — Ambulatory Visit (INDEPENDENT_AMBULATORY_CARE_PROVIDER_SITE_OTHER): Admitting: Neurology

## 2024-03-19 VITALS — BP 139/85

## 2024-03-19 DIAGNOSIS — G43709 Chronic migraine without aura, not intractable, without status migrainosus: Secondary | ICD-10-CM

## 2024-03-19 DIAGNOSIS — G43811 Other migraine, intractable, with status migrainosus: Secondary | ICD-10-CM

## 2024-03-19 MED ORDER — ONABOTULINUMTOXINA 200 UNITS IJ SOLR
155.0000 [IU] | Freq: Once | INTRAMUSCULAR | Status: AC
  Administered 2024-03-19: 155 [IU] via INTRAMUSCULAR

## 2024-03-19 NOTE — Progress Notes (Signed)
 Botox - 200 units x 1 vial Lot: [id number] Expiration: 2027/10 NDC: 0023-3921-02  Bacteriostatic 0.9% Sodium Chloride - 4 mL  Lot: [id number] Expiration: 03/31/25 NDC: 6213086578  Dx: I69.629  S/P  Witnessed by a jones rn

## 2024-03-19 NOTE — Progress Notes (Signed)
    BOTOX  PROCEDURE NOTE FOR MIGRAINE HEADACHE  HISTORY: Lauren Wolfe is here for Botox . Last was 12/12/23 with me. We did a VV in May for medication management, added Ubrelvy . On Nurtec every other day   Description of procedure:  The patient was placed in a sitting position. The standard protocol was used for Botox  as follows, with 5 units of Botox  injected at each site:   -Procerus muscle, midline injection  -Corrugator muscle, bilateral injection  -Frontalis muscle, bilateral injection, with 2 sites each side, medial injection was performed in the upper one third of the frontalis muscle, in the region vertical from the medial inferior edge of the superior orbital rim. The lateral injection was again in the upper one third of the forehead vertically above the lateral limbus of the cornea, 1.5 cm lateral to the medial injection site.  -Temporalis muscle injection, 4 sites, bilaterally. The first injection was 3 cm above the tragus of the ear, second injection site was 1.5 cm to 3 cm up from the first injection site in line with the tragus of the ear. The third injection site was 1.5-3 cm forward between the first 2 injection sites. The fourth injection site was 1.5 cm posterior to the second injection site.  -Occipitalis muscle injection, 3 sites, bilaterally. The first injection was done one half way between the occipital protuberance and the tip of the mastoid process behind the ear. The second injection site was done lateral and superior to the first, 1 fingerbreadth from the first injection. The third injection site was 1 fingerbreadth superiorly and medially from the first injection site.  -Cervical paraspinal muscle injection, 2 sites, bilateral, the first injection site was 1 cm from the midline of the cervical spine, 3 cm inferior to the lower border of the occipital protuberance. The second injection site was 1.5 cm superiorly and laterally to the first injection  site.  -Trapezius muscle injection was performed at 3 sites, bilaterally. The first injection site was in the upper trapezius muscle halfway between the inflection point of the neck, and the acromion. The second injection site was one half way between the acromion and the first injection site. The third injection was done between the first injection site and the inflection point of the neck.   A 200 unit bottle of Botox  was used, 155 units were injected, the rest of the Botox  was wasted. The patient tolerated the procedure well, there were no complications of the above procedure.  Botox  NDC 6644-0347-42 Lot number [id number] Expiration date 07/2026 SP  We will continue Botox .  She is unsure of the benefit of Ubrelvy .  Will continue Nurtec every other day as needed for migraine prevention.

## 2024-03-31 ENCOUNTER — Other Ambulatory Visit: Payer: Self-pay

## 2024-03-31 ENCOUNTER — Other Ambulatory Visit (HOSPITAL_BASED_OUTPATIENT_CLINIC_OR_DEPARTMENT_OTHER): Payer: Self-pay

## 2024-04-07 ENCOUNTER — Other Ambulatory Visit: Payer: Self-pay | Admitting: Obstetrics and Gynecology

## 2024-04-07 DIAGNOSIS — Z1231 Encounter for screening mammogram for malignant neoplasm of breast: Secondary | ICD-10-CM

## 2024-04-15 ENCOUNTER — Ambulatory Visit (INDEPENDENT_AMBULATORY_CARE_PROVIDER_SITE_OTHER): Admitting: Family Medicine

## 2024-04-15 ENCOUNTER — Encounter (INDEPENDENT_AMBULATORY_CARE_PROVIDER_SITE_OTHER): Payer: Self-pay | Admitting: Family Medicine

## 2024-04-15 VITALS — BP 138/87 | HR 83 | Temp 98.7°F | Ht 66.0 in | Wt 165.0 lb

## 2024-04-15 DIAGNOSIS — E6609 Other obesity due to excess calories: Secondary | ICD-10-CM

## 2024-04-15 DIAGNOSIS — R7303 Prediabetes: Secondary | ICD-10-CM | POA: Diagnosis not present

## 2024-04-15 DIAGNOSIS — E559 Vitamin D deficiency, unspecified: Secondary | ICD-10-CM

## 2024-04-15 DIAGNOSIS — Z6826 Body mass index (BMI) 26.0-26.9, adult: Secondary | ICD-10-CM

## 2024-04-15 DIAGNOSIS — G43009 Migraine without aura, not intractable, without status migrainosus: Secondary | ICD-10-CM | POA: Diagnosis not present

## 2024-04-15 DIAGNOSIS — E66811 Obesity, class 1: Secondary | ICD-10-CM | POA: Diagnosis not present

## 2024-04-15 MED ORDER — VITAMIN D (ERGOCALCIFEROL) 1.25 MG (50000 UNIT) PO CAPS: 50000.0000 [IU] | ORAL_CAPSULE | ORAL | 0 refills | Status: DC

## 2024-04-15 NOTE — Progress Notes (Signed)
 Lauren Wolfe, D.O.  ABFM, ABOM Specializing in Clinical Bariatric Medicine  Office located at: [address] [place], [place]  [address]   Assessment and Plan:   Medications Discontinued During This Encounter  Medication Reason   Vitamin D , Ergocalciferol , (DRISDOL ) 1.25 MG (50000 UNIT) CAPS capsule Reorder     Meds ordered this encounter  Medications   Vitamin D , Ergocalciferol , (DRISDOL ) 1.25 MG (50000 UNIT) CAPS capsule    Sig: Take 1 capsule (50,000 Units total) by mouth every 7 (seven) days.    Dispense:  12 capsule    Refill:  0     FOR THE DISEASE OF OBESITY:  BMI 26.0-26.9,adult current 26.63 Class 1 obesity due to excess calories without serious comorbidity with body mass index (BMI) of 31.0 to 31.9 in adult , start BMI 31.62 Assessment & Plan: Since last office visit on 03/18/2024 patient's muscle mass has decreased by 0.8 lbs. Fat mass has decreased by 1.4 lbs. Total body water has decreased by 2.4 lbs.  Counseling done on how various foods will affect these numbers and how to maximize success  Total lbs lost to date: - 32 lbs  Total weight loss percentage to date: -16.24%    Recommended Dietary Goals [name] is currently in the action stage of change. As such, her goal is to continue weight management plan.  She has agreed to: continue current plan   Behavioral Intervention We discussed the following today: setting boundaries with others and creating time for self care.  Additional resources provided today: None  Evidence-based interventions for health behavior change were utilized today including the discussion of self monitoring techniques, problem-solving barriers and SMART goal setting techniques.   Regarding patient's less desirable eating habits and patterns, we employed the technique of small changes.   Pt will specifically work on: n/a   Recommended Physical Activity Goals [name] has been advised to work up to 300-450 minutes of  moderate intensity aerobic activity a week and strengthening exercises 2-3 times per week for cardiovascular health, weight loss maintenance and preservation of muscle mass.   She has agreed to: continue to gradually increase the amount and intensity of exercise routine   Pharmacotherapy See Pre-DM note.    ASSOCIATED CONDITIONS ADDRESSED TODAY:   Prediabetes Assessment & Plan: Lab Results  Component Value Date   HGBA1C 5.3 12/20/2023   HGBA1C 5.2 09/13/2023   HGBA1C 5.3 05/03/2023   INSULIN  9.7 09/13/2023   INSULIN  10.3 05/03/2023   INSULIN  10.5 01/16/2023   Managed with Zepbound  5 mg weekly, diet, and exercise. Good control of hunger and cravings. Her insurance stopped covering Zepbound  so once she completes her remaining shots, she will be transitioning to Wegovy  0.25 mg weekly to maintain her weight loss. Continue journaling plan and implementing nutritional & behavioral strategies.    Migraine without aura and without status migrainosus, not intractable Assessment & Plan: Per history. Currently on Nurtec 75 mg every other day and Imitrex  as needed. She is intolerant to Topiramate. Sometimes the heat makes her headaches worse, but overall symptoms are controlled. She drinks 90-120 ounces of water daily. Continue medication regimen. Will continue to monitor alongside specialists/PCP.     Vitamin D  deficiency Assessment & Plan: Lab Results  Component Value Date   VD25OH 48.0 01/17/2024   VD25OH 44.0 09/13/2023   VD25OH 48.7 05/03/2023   Pt is currently on Ergocalciferol  50,000 units weekly without adverse SE. Continue supplement. Recheck periodically.     Follow up:  Return 06/11/2024 at 7:40 AM.  She was informed of the importance of frequent follow up visits to maximize her success with intensive lifestyle modifications for her multiple health conditions.   Subjective:   Chief complaint: Obesity Honey is here to discuss her progress with her obesity  treatment plan.She is keeping a food journal and adhering to recommended goals of 1050-1150 calories and 80+ g of protein and the Pescatarian Plan.   Interval History:  [name] is here for a follow up office visit. Since last OV on 03/18/2024 , she is down 2 lbs. States she is following her eating plan approximately 75% of the time. She states she is walking or strength training 30 minutes 3 days per week .    Pharmacotherapy that aid with weight loss: Her insurance stopped covering Zepbound  so once she completes her remaining shots, she will be transitioning to Wegovy  0.25 mg weekly to maintain her weight loss.   Review of Systems:  Pertinent positives were addressed with patient today.  Reviewed by clinician on day of visit: allergies, medications, problem list, medical history, surgical history, family history, social history, and previous encounter notes.  Weight Summary and Biometrics   Weight Lost Since Last Visit: 2lb  Weight Gained Since Last Visit: 0lb   Vitals Temp: 98.7 F (37.1 C) BP: 138/87 Pulse Rate: 83 SpO2: 100 %   Anthropometric Measurements Height: 5' 6 (1.676 m) Weight: 165 lb (74.8 kg) BMI (Calculated): 26.64 Weight at Last Visit: 167lb Weight Lost Since Last Visit: 2lb Weight Gained Since Last Visit: 0lb Starting Weight: 197lb Total Weight Loss (lbs): 32 lb (14.5 kg)   Body Composition  Body Fat %: 34.7 % Fat Mass (lbs): 57.4 lbs Muscle Mass (lbs): 102.4 lbs Total Body Water (lbs): 72.6 lbs Visceral Fat Rating : 7   Other Clinical Data Fasting: No Labs: no Today's Visit #: 16 Starting Date: 01/16/23 Comments: Pescatarian/ j 1050-1150/80+    Objective:   PHYSICAL EXAM: Blood pressure 138/87, pulse 83, temperature 98.7 F (37.1 C), height 5' 6 (1.676 m), weight 165 lb (74.8 kg), SpO2 100%. Body mass index is 26.63 kg/m.  General: she is overweight, cooperative and in no acute distress. PSYCH: Has normal mood, affect  and thought process.   HEENT: EOMI, sclerae are anicteric. Lungs: Normal breathing effort, no conversational dyspnea. Extremities: Moves * 4 Neurologic: A and O * 3, good insight  DIAGNOSTIC DATA REVIEWED: BMET    Component Value Date/Time   NA 138 12/20/2023 0852   NA 141 09/13/2023 1444   K 4.0 12/20/2023 0852   CL 103 12/20/2023 0852   CO2 30 12/20/2023 0852   GLUCOSE 79 12/20/2023 0852   BUN 12 12/20/2023 0852   BUN 15 09/13/2023 1444   CREATININE 0.82 12/20/2023 0852   CALCIUM  9.5 12/20/2023 0852   GFRNONAA 67 07/30/2020 1051   GFRAA 77 07/30/2020 1051   Lab Results  Component Value Date   HGBA1C 5.3 12/20/2023   HGBA1C 5.3 02/24/2022   Lab Results  Component Value Date   INSULIN  9.7 09/13/2023   INSULIN  10.5 01/16/2023   Lab Results  Component Value Date   TSH 0.99 12/20/2023   CBC    Component Value Date/Time   WBC 4.9 12/20/2023 0852   RBC 5.05 12/20/2023 0852   HGB 15.4 (H) 12/20/2023 0852   HCT 46.2 (H) 12/20/2023 0852   PLT 237.0 12/20/2023 0852   MCV 91.4 12/20/2023 0852   MCH 29.8 11/11/2019 2237  MCHC 33.4 12/20/2023 0852   RDW 13.1 12/20/2023 0852   Iron Studies    Component Value Date/Time   IRON 129 12/17/2019 0926   FERRITIN 348 (H) 10/22/2019 0222   IRONPCTSAT 43.1 12/17/2019 0926   Lipid Panel     Component Value Date/Time   CHOL 187 12/20/2023 0852   CHOL 220 (H) 09/13/2023 1444   TRIG 67.0 12/20/2023 0852   HDL 69.90 12/20/2023 0852   HDL 73 09/13/2023 1444   CHOLHDL 3 12/20/2023 0852   VLDL 13.4 12/20/2023 0852   LDLCALC 104 (H) 12/20/2023 0852   LDLCALC 139 (H) 09/13/2023 1444   LDLDIRECT 164.5 09/19/2012 1052   Hepatic Function Panel     Component Value Date/Time   PROT 7.5 12/20/2023 0852   PROT 7.0 09/13/2023 1444   ALBUMIN 4.6 12/20/2023 0852   ALBUMIN 4.6 09/13/2023 1444   AST 15 12/20/2023 0852   ALT 10 12/20/2023 0852   ALKPHOS 58 12/20/2023 0852   BILITOT 0.5 12/20/2023 0852   BILITOT 0.3 09/13/2023  1444   BILIDIR 0.2 12/20/2023 0852   BILIDIR <0.10 11/02/2021 0847      Component Value Date/Time   TSH 0.99 12/20/2023 0852   Nutritional Lab Results  Component Value Date   VD25OH 48.0 01/17/2024   VD25OH 44.0 09/13/2023   VD25OH 48.7 05/03/2023    Attestations:   I, Special Puri, acting as a Stage manager for [employer], DO., have compiled all relevant documentation for today's office visit on behalf of Lauren Jenkins, DO, while in the presence of [employer], DO.  I have reviewed the above documentation for accuracy and completeness, and I agree with the above. Lauren JINNY Wolfe, D.O.  The 21st Century Cures Act was signed into law in 2016 which includes the topic of electronic health records.  This provides immediate access to information in MyChart.  This includes consultation notes, operative notes, office notes, lab results and pathology reports.  If you have any questions about what you read please let us  know at your next visit so we can discuss your concerns and take corrective action if need be.  We are right here with you.

## 2024-04-24 ENCOUNTER — Other Ambulatory Visit: Payer: Self-pay

## 2024-05-14 ENCOUNTER — Encounter (INDEPENDENT_AMBULATORY_CARE_PROVIDER_SITE_OTHER): Payer: Self-pay | Admitting: Family Medicine

## 2024-05-15 ENCOUNTER — Encounter: Payer: Self-pay | Admitting: Internal Medicine

## 2024-05-16 ENCOUNTER — Other Ambulatory Visit (HOSPITAL_COMMUNITY): Payer: Self-pay

## 2024-05-16 MED ORDER — WEGOVY 0.5 MG/0.5ML ~~LOC~~ SOAJ
0.5000 mg | SUBCUTANEOUS | 11 refills | Status: DC
  Filled 2024-05-16: qty 2, 28d supply, fill #0
  Filled 2024-06-12: qty 2, 28d supply, fill #1

## 2024-05-16 NOTE — Telephone Encounter (Signed)
 Duplicate message

## 2024-05-21 ENCOUNTER — Encounter: Payer: Self-pay | Admitting: Cardiovascular Disease

## 2024-05-21 ENCOUNTER — Ambulatory Visit
Admission: RE | Admit: 2024-05-21 | Discharge: 2024-05-21 | Disposition: A | Discharge status: Home / Self Care | Source: Ambulatory Visit

## 2024-05-21 DIAGNOSIS — Z1231 Encounter for screening mammogram for malignant neoplasm of breast: Secondary | ICD-10-CM

## 2024-05-28 ENCOUNTER — Other Ambulatory Visit: Payer: Self-pay

## 2024-05-28 ENCOUNTER — Other Ambulatory Visit (HOSPITAL_COMMUNITY): Payer: Self-pay

## 2024-05-28 ENCOUNTER — Other Ambulatory Visit: Payer: Self-pay | Admitting: Neurology

## 2024-05-28 MED ORDER — BOTOX 200 UNITS IJ SOLR
INTRAMUSCULAR | 2 refills | Status: AC
  Filled 2024-05-28: qty 1, 90d supply, fill #0
  Filled 2024-09-08 – 2024-09-15 (×2): qty 1, 90d supply, fill #1

## 2024-05-28 NOTE — Progress Notes (Signed)
 Specialty Pharmacy Refill Coordination Note  Clear Bag Patient  Lauren Wolfe is a 48 y.o. female contacted today regarding refills of specialty medication(s) OnabotulinumtoxinA  (Botox )  Doses on hand: 0   Injection appointment: 06/12/24  Patient requested: Courier to Provider Office   Delivery date: 06/05/24   Verified address: [facility]  [address] [place] [address]  Medication will be filled on 06/04/24.  This fill date is pending response to refill request from provider. Patient is aware and if they have not received fill by intended date, they must follow up with pharmacy.

## 2024-06-04 ENCOUNTER — Other Ambulatory Visit (HOSPITAL_COMMUNITY): Payer: Self-pay

## 2024-06-11 ENCOUNTER — Telehealth (HOSPITAL_COMMUNITY): Payer: Self-pay

## 2024-06-11 ENCOUNTER — Other Ambulatory Visit (HOSPITAL_COMMUNITY): Payer: Self-pay

## 2024-06-11 ENCOUNTER — Ambulatory Visit (INDEPENDENT_AMBULATORY_CARE_PROVIDER_SITE_OTHER): Admitting: Family Medicine

## 2024-06-12 ENCOUNTER — Other Ambulatory Visit (HOSPITAL_COMMUNITY): Payer: Self-pay

## 2024-06-12 ENCOUNTER — Encounter: Payer: Self-pay | Admitting: Neurology

## 2024-06-12 ENCOUNTER — Other Ambulatory Visit (HOSPITAL_BASED_OUTPATIENT_CLINIC_OR_DEPARTMENT_OTHER): Payer: Self-pay

## 2024-06-12 ENCOUNTER — Ambulatory Visit (INDEPENDENT_AMBULATORY_CARE_PROVIDER_SITE_OTHER): Admitting: Neurology

## 2024-06-12 VITALS — BP 138/80 | HR 78 | Ht 66.0 in | Wt 175.0 lb

## 2024-06-12 DIAGNOSIS — G43811 Other migraine, intractable, with status migrainosus: Secondary | ICD-10-CM

## 2024-06-12 DIAGNOSIS — G43709 Chronic migraine without aura, not intractable, without status migrainosus: Secondary | ICD-10-CM

## 2024-06-12 MED ORDER — ZAVZPRET 10 MG/ACT NA SOLN: 10.0000 mg | NASAL | Status: AC

## 2024-06-12 MED ORDER — ONABOTULINUMTOXINA 200 UNITS IJ SOLR
155.0000 [IU] | Freq: Once | INTRAMUSCULAR | Status: AC
  Administered 2024-06-12: 155 [IU] via INTRAMUSCULAR

## 2024-06-12 NOTE — Progress Notes (Signed)
 Botox - 200 units x 1 vial Lot: [id number] Expiration: 2026/11 NDC: 0023-3921-02  Bacteriostatic 0.9% Sodium Chloride - 4 mL  Lot: [id number] Expiration: 08/01/25 NDC: 9590803397  Dx:  H56.290    H56.188   S/P  Witnessed by DELENA MOLT RN

## 2024-06-12 NOTE — Progress Notes (Signed)
   BOTOX  PROCEDURE NOTE FOR MIGRAINE HEADACHE  HISTORY: Lauren Wolfe is here for Botox . Last was 03/19/24. Did well for the 1st 2 months. For the last month, has had a headache everyday. Wakes up in the morning with headache, has had sleep apnea testing and was normal.  Nurtec does help, but has to take daily. Ubrelvy  upset her stomach.   Description of procedure:  The patient was placed in a sitting position. The standard protocol was used for Botox  as follows, with 5 units of Botox  injected at each site:   -Procerus muscle, midline injection  -Corrugator muscle, bilateral injection  -Frontalis muscle, bilateral injection, with 2 sites each side, medial injection was performed in the upper one third of the frontalis muscle, in the region vertical from the medial inferior edge of the superior orbital rim. The lateral injection was again in the upper one third of the forehead vertically above the lateral limbus of the cornea, 1.5 cm lateral to the medial injection site.  -Temporalis muscle injection, 4 sites, bilaterally. The first injection was 3 cm above the tragus of the ear, second injection site was 1.5 cm to 3 cm up from the first injection site in line with the tragus of the ear. The third injection site was 1.5-3 cm forward between the first 2 injection sites. The fourth injection site was 1.5 cm posterior to the second injection site.  -Occipitalis muscle injection, 3 sites, bilaterally. The first injection was done one half way between the occipital protuberance and the tip of the mastoid process behind the ear. The second injection site was done lateral and superior to the first, 1 fingerbreadth from the first injection. The third injection site was 1 fingerbreadth superiorly and medially from the first injection site.  -Cervical paraspinal muscle injection, 2 sites, bilateral, the first injection site was 1 cm from the midline of the cervical spine, 3 cm inferior to the lower  border of the occipital protuberance. The second injection site was 1.5 cm superiorly and laterally to the first injection site.  -Trapezius muscle injection was performed at 3 sites, bilaterally. The first injection site was in the upper trapezius muscle halfway between the inflection point of the neck, and the acromion. The second injection site was one half way between the acromion and the first injection site. The third injection was done between the first injection site and the inflection point of the neck.   A 200 unit bottle of Botox  was used, 155 units were injected, the rest of the Botox  was wasted. The patient tolerated the procedure well, there were no complications of the above procedure.  Botox  NDC 9976-6078-97 Lot number [id number] Expiration date 08/2025 SP  We will give her a sample of Zavzpret  to try for acute migraine, especially since early AM waking with headache.  Continue Nurtec every other day for migraine prevention, mostly uses in the last month of Botox .  Ubrelvy  caused stomach upset.  We may consider adding Qulipta, but this last cycle she had 2 months with no migraines.

## 2024-06-26 ENCOUNTER — Ambulatory Visit (INDEPENDENT_AMBULATORY_CARE_PROVIDER_SITE_OTHER): Admitting: Family Medicine

## 2024-06-28 ENCOUNTER — Other Ambulatory Visit: Payer: Self-pay | Admitting: Neurology

## 2024-07-01 ENCOUNTER — Other Ambulatory Visit: Payer: Self-pay

## 2024-07-01 ENCOUNTER — Ambulatory Visit (INDEPENDENT_AMBULATORY_CARE_PROVIDER_SITE_OTHER): Admitting: Family Medicine

## 2024-07-01 ENCOUNTER — Other Ambulatory Visit (HOSPITAL_COMMUNITY): Payer: Self-pay

## 2024-07-01 ENCOUNTER — Encounter (INDEPENDENT_AMBULATORY_CARE_PROVIDER_SITE_OTHER): Payer: Self-pay | Admitting: Family Medicine

## 2024-07-01 VITALS — BP 127/85 | HR 75 | Temp 98.1°F | Ht 66.0 in | Wt 173.0 lb

## 2024-07-01 DIAGNOSIS — G43009 Migraine without aura, not intractable, without status migrainosus: Secondary | ICD-10-CM

## 2024-07-01 DIAGNOSIS — E7849 Other hyperlipidemia: Secondary | ICD-10-CM

## 2024-07-01 DIAGNOSIS — E559 Vitamin D deficiency, unspecified: Secondary | ICD-10-CM | POA: Diagnosis not present

## 2024-07-01 DIAGNOSIS — E6609 Other obesity due to excess calories: Secondary | ICD-10-CM

## 2024-07-01 DIAGNOSIS — R7303 Prediabetes: Secondary | ICD-10-CM

## 2024-07-01 DIAGNOSIS — J3089 Other allergic rhinitis: Secondary | ICD-10-CM

## 2024-07-01 DIAGNOSIS — E66811 Obesity, class 1: Secondary | ICD-10-CM

## 2024-07-01 DIAGNOSIS — Z6827 Body mass index (BMI) 27.0-27.9, adult: Secondary | ICD-10-CM

## 2024-07-01 DIAGNOSIS — J302 Other seasonal allergic rhinitis: Secondary | ICD-10-CM

## 2024-07-01 MED ORDER — MONTELUKAST SODIUM 10 MG PO TABS
10.0000 mg | ORAL_TABLET | Freq: Every day | ORAL | 0 refills | Status: AC
  Filled 2024-07-01: qty 30, 30d supply, fill #0
  Filled 2024-09-03: qty 30, 30d supply, fill #1

## 2024-07-01 MED ORDER — WEGOVY 1 MG/0.5ML ~~LOC~~ SOAJ
1.0000 mg | SUBCUTANEOUS | 0 refills | Status: DC
  Filled 2024-07-01: qty 2, 28d supply, fill #0

## 2024-07-01 MED ORDER — VITAMIN D (ERGOCALCIFEROL) 1.25 MG (50000 UNIT) PO CAPS
50000.0000 [IU] | ORAL_CAPSULE | ORAL | 0 refills | Status: DC
  Filled 2024-07-01: qty 4, 28d supply, fill #0

## 2024-07-01 MED ORDER — LEVOCETIRIZINE DIHYDROCHLORIDE 5 MG PO TABS
5.0000 mg | ORAL_TABLET | Freq: Every evening | ORAL | 0 refills | Status: AC
  Filled 2024-07-01: qty 30, 30d supply, fill #0
  Filled 2024-09-03: qty 30, 30d supply, fill #1

## 2024-07-01 NOTE — Progress Notes (Signed)
 Lauren DOROTHA Wolfe, D.O.  ABFM, ABOM Specializing in Clinical Bariatric Medicine  Office located at: [address] [place], [place]  [address]   Assessment and Plan:   Orders Placed This Encounter  Procedures   VITAMIN D  25 Hydroxy (Vit-D Deficiency, Fractures)   Lipid panel   Comprehensive metabolic panel with GFR   Hemoglobin A1c   Insulin , random   Vitamin B12    Medications Discontinued During This Encounter  Medication Reason   semaglutide -weight management (WEGOVY ) 0.5 MG/0.5ML SOAJ SQ injection Dose change   Vitamin D , Ergocalciferol , (DRISDOL ) 1.25 MG (50000 UNIT) CAPS capsule Reorder     Meds ordered this encounter  Medications   montelukast (SINGULAIR) 10 MG tablet    Sig: Take 1 tablet (10 mg total) by mouth at bedtime.    Dispense:  90 tablet    Refill:  0   levocetirizine (XYZAL) 5 MG tablet    Sig: Take 1 tablet (5 mg total) by mouth every evening.    Dispense:  90 tablet    Refill:  0   Vitamin D , Ergocalciferol , (DRISDOL ) 1.25 MG (50000 UNIT) CAPS capsule    Sig: Take 1 capsule (50,000 Units total) by mouth every 7 (seven) days.    Dispense:  12 capsule    Refill:  0   semaglutide -weight management (WEGOVY ) 1 MG/0.5ML SOAJ SQ injection    Sig: Inject 1 mg into the skin once a week.    Dispense:  2 mL    Refill:  0    -Obtain fasting labs today  FOR THE DISEASE OF OBESITY:  Class 1 obesity due to excess calories without serious comorbidity with body mass index (BMI) of 31.0 to 31.9 in adult , start BMI 31.62 BMI 27.0-27.9,adult- current BMI 27.94 Assessment & Plan: Since last office visit on 04/15/24 patient's muscle mass has increased by 4.2 lbs. Fat mass has increased by 4.2 lbs. Total body water has increased by 0.8 lbs.  Body fat % has increased by 0.2 %. Counseling done on how various foods will affect these numbers and how to maximize success  Total lbs lost to date: - 24 lbs Total weight loss percentage to date: -12.18  %   Recommended Dietary Goals Lauren Wolfe is currently in the action stage of change. As such, her goal is to continue weight management plan.  She has agreed to: keeping a food journal and adhering to recommended goals of 1050-1150 calories and 80+ g of protein and the Pescatarian Plan.   Behavioral Intervention We discussed the following today: avoiding skipping meals and increasing water intake , Migraine control.  Additional resources provided today: Handout on Daily Food Journaling Log  Evidence-based interventions for health behavior change were utilized today including the discussion of self monitoring techniques, problem-solving barriers and SMART goal setting techniques.   Regarding patient's less desirable eating habits and patterns, we employed the technique of small changes.   Goal(s) for next OV: Journal everyday     Recommended Physical Activity Goals Lauren Wolfe has been advised to work up to 300-450 minutes of moderate intensity aerobic activity a week and strengthening exercises 2-3 times per week for cardiovascular health, weight loss maintenance and preservation of muscle mass.   She has agreed to: Increase physical activity in their day and reduce sedentary time (increase NEAT)., Increase volume of physical activity to a goal of 240 minutes a week, and Combine aerobic and strengthening exercises for efficiency and improved cardiometabolic health.   Pharmacotherapy We both  agreed to: Continue with current nutritional and behavioral strategies and medications (Wegovy )   ASSOCIATED CONDITIONS ADDRESSED TODAY:  Prediabetes Assessment & Plan Lab Results  Component Value Date   HGBA1C 5.3 12/20/2023   HGBA1C 5.2 09/13/2023   HGBA1C 5.3 05/03/2023   INSULIN  9.7 09/13/2023   INSULIN  10.3 05/03/2023   INSULIN  10.5 01/16/2023    Pt was previously on Zepbound  5 mg but insurance stopped covering Zepbound . She started Wegovy  0.25 mg weekly. Pt states that it did not help  at all she ate everything in sight. She endorses having increase in hunger and cravings. Due to not having good control of hunger and cravings we mutually agreed to increase Wegovy  to 1 mg weekly.  Continue implementing nutritional plan. Will obtain labs and review at next OV.    Environmental and seasonal allergies Migraine without aura and without status migrainosus, not intractable Assessment & Plan Pt states that she has been getting migraine injections every month. She endorses that she can tell when her injections are starting to wear to off. Pt reports that she notices that once the weather starts fluctuating is when she will begin having migraines more consistently.She recently reports having a migraine for 30 days. Since the start of symptoms are cyclical we recommended pt to begin steroid spray like Flonase. Prescribed Singulair and Xyzal to help prevent inflammation and relieve some pressure. Educated pt on benefits and risk. Take to help prevent in the future. Will continue monitoring.     Vitamin D  deficiency Assessment & Plan Lab Results  Component Value Date   VD25OH 48.0 01/17/2024   VD25OH 44.0 09/13/2023   VD25OH 48.7 05/03/2023   Taking Ergo 50K Good compliance and tolerance. Continue supplementation. Will refill and obtain labs today.    Other hyperlipidemia Assessment & Plan Lab Results  Component Value Date   CHOL 187 12/20/2023   HDL 69.90 12/20/2023   LDLCALC 104 (H) 12/20/2023   LDLDIRECT 164.5 09/19/2012   TRIG 67.0 12/20/2023   CHOLHDL 3 12/20/2023   On Praluent  150 mg every 14 days. With good compliance and tolerance. No acute concerns today. Continue meds.  Stressed the importance that patient continue with our prudent nutritional plan that is low in saturated and trans fats, and low in fatty carbs to improve these numbers. Will obtain labs today and review at next OV.      Follow up:   Return 07/29/2024 at 12:20 PM  She was informed of the  importance of frequent follow up visits to maximize her success with intensive lifestyle modifications for her multiple health conditions.    Subjective:    Chief complaint: Obesity Lauren Wolfe is here to discuss her progress with her obesity treatment plan. She is keeping a food journal and adhering to recommended goals of 1050-1150 calories and 80+ g of protein and the Pescatarian Plan.  and states she is following her eating plan approximately 50 % of the time. Pt is walking 40 minutes 3 days per week    Interval History:  Lauren Wolfe is here for a follow up office visit. Pt has experienced a weight gain of 8 lbs since last OV on 04/15/24. Pt states that she feels like she is falling apart. Her migraine season has began and she is not feeling great. She went to [place] for her [place] and has been a little busy since her grandson has been in the ICU. She endorses feeling like she should come more often.    Their dietary  and life style habits include:  - Tracking Calories/Macros: no   - Eating More Whole Foods: no   - Adequate Protein Intake: yes  - Adequate Water Intake: yes  - Skipping Meals: yes  - Sleeping 7-9 Hours/ Night: no - migraines     Pharmacotherapy that aid with weight loss: She is currently taking Wegovy  0.5 mg weekly.    Review of Systems:  Pertinent positives were addressed with patient today.  Reviewed by clinician on day of visit: allergies, medications, problem list, medical history, surgical history, family history, social history, and previous encounter notes.  Weight Summary and Biometrics   Weight Lost Since Last Visit: 0lb  Weight Gained Since Last Visit: 8lb    Vitals Temp: 98.1 F (36.7 C) BP: 127/85 Pulse Rate: 75 SpO2: 98 %   Anthropometric Measurements Height: 5' 6 (1.676 m) Weight: 173 lb (78.5 kg) BMI (Calculated): 27.94 Weight at Last Visit: 165lb Weight Lost Since Last Visit: 0lb Weight Gained Since Last Visit:  8lb Starting Weight: 197lb Total Weight Loss (lbs): 24 lb (10.9 kg)   Body Composition  Body Fat %: 35.5 % Fat Mass (lbs): 61.6 lbs Muscle Mass (lbs): 106.6 lbs Total Body Water (lbs): 73.4 lbs Visceral Fat Rating : 7   Other Clinical Data Fasting: yes Labs: no Today's Visit #: 17 Starting Date: 01/16/23    Objective:   PHYSICAL EXAM: Blood pressure 127/85, pulse 75, temperature 98.1 F (36.7 C), height 5' 6 (1.676 m), weight 173 lb (78.5 kg), SpO2 98%. Body mass index is 27.92 kg/m.  General: she is overweight, cooperative and in no acute distress. PSYCH: Has normal mood, affect and thought process.   HEENT: EOMI, sclerae are anicteric. Lungs: Normal breathing effort, no conversational dyspnea. Extremities: Moves * 4 Neurologic: A and O * 3, good insight  DIAGNOSTIC DATA REVIEWED: BMET    Component Value Date/Time   NA 138 12/20/2023 0852   NA 141 09/13/2023 1444   K 4.0 12/20/2023 0852   CL 103 12/20/2023 0852   CO2 30 12/20/2023 0852   GLUCOSE 79 12/20/2023 0852   BUN 12 12/20/2023 0852   BUN 15 09/13/2023 1444   CREATININE 0.82 12/20/2023 0852   CALCIUM  9.5 12/20/2023 0852   GFRNONAA 67 07/30/2020 1051   GFRAA 77 07/30/2020 1051   Lab Results  Component Value Date   HGBA1C 5.3 12/20/2023   HGBA1C 5.3 02/24/2022   Lab Results  Component Value Date   INSULIN  9.7 09/13/2023   INSULIN  10.5 01/16/2023   Lab Results  Component Value Date   TSH 0.99 12/20/2023   CBC    Component Value Date/Time   WBC 4.9 12/20/2023 0852   RBC 5.05 12/20/2023 0852   HGB 15.4 (H) 12/20/2023 0852   HCT 46.2 (H) 12/20/2023 0852   PLT 237.0 12/20/2023 0852   MCV 91.4 12/20/2023 0852   MCH 29.8 11/11/2019 2237   MCHC 33.4 12/20/2023 0852   RDW 13.1 12/20/2023 0852   Iron Studies    Component Value Date/Time   IRON 129 12/17/2019 0926   FERRITIN 348 (H) 10/22/2019 0222   IRONPCTSAT 43.1 12/17/2019 0926   Lipid Panel     Component Value Date/Time   CHOL  187 12/20/2023 0852   CHOL 220 (H) 09/13/2023 1444   TRIG 67.0 12/20/2023 0852   HDL 69.90 12/20/2023 0852   HDL 73 09/13/2023 1444   CHOLHDL 3 12/20/2023 0852   VLDL 13.4 12/20/2023 0852   LDLCALC 104 (H) 12/20/2023 9147  LDLCALC 139 (H) 09/13/2023 1444   LDLDIRECT 164.5 09/19/2012 1052   Hepatic Function Panel     Component Value Date/Time   PROT 7.5 12/20/2023 0852   PROT 7.0 09/13/2023 1444   ALBUMIN 4.6 12/20/2023 0852   ALBUMIN 4.6 09/13/2023 1444   AST 15 12/20/2023 0852   ALT 10 12/20/2023 0852   ALKPHOS 58 12/20/2023 0852   BILITOT 0.5 12/20/2023 0852   BILITOT 0.3 09/13/2023 1444   BILIDIR 0.2 12/20/2023 0852   BILIDIR <0.10 11/02/2021 0847      Component Value Date/Time   TSH 0.99 12/20/2023 0852   Nutritional Lab Results  Component Value Date   VD25OH 48.0 01/17/2024   VD25OH 44.0 09/13/2023   VD25OH 48.7 05/03/2023    Attestations:   I, Sonny Laroche, acting as a Stage manager for Lauren Jenkins, DO., have compiled all relevant documentation for today's office visit on behalf of Lauren Jenkins, DO, while in the presence of [employer], DO.     I have reviewed the above documentation for accuracy and completeness, and I agree with the above. Lauren Wolfe, D.O.  The 21st Century Cures Act was signed into law in 2016 which includes the topic of electronic health records.  This provides immediate access to information in MyChart.  This includes consultation notes, operative notes, office notes, lab results and pathology reports.  If you have any questions about what you read please let us  know at your next visit so we can discuss your concerns and take corrective action if need be.  We are right here with you.

## 2024-07-02 LAB — LIPID PANEL
Chol/HDL Ratio: 2.5 ratio (ref 0.0–4.4)
Cholesterol, Total: 191 mg/dL (ref 100–199)
HDL: 76 mg/dL (ref >39)
LDL Chol Calc (NIH): 103 mg/dL — ABNORMAL HIGH (ref 0–99)
Triglycerides: 63 mg/dL (ref 0–149)
VLDL Cholesterol Cal: 12 mg/dL (ref 5–40)

## 2024-07-02 LAB — COMPREHENSIVE METABOLIC PANEL WITH GFR
ALT: 14 IU/L (ref 0–32)
AST: 14 IU/L (ref 0–40)
Albumin: 4.5 g/dL (ref 3.9–4.9)
Alkaline Phosphatase: 74 IU/L (ref 41–116)
BUN/Creatinine Ratio: 17 (ref 9–23)
BUN: 13 mg/dL (ref 6–24)
Bilirubin Total: 0.3 mg/dL (ref 0.0–1.2)
CO2: 23 mmol/L (ref 20–29)
Calcium: 9.5 mg/dL (ref 8.7–10.2)
Chloride: 102 mmol/L (ref 96–106)
Creatinine, Ser: 0.78 mg/dL (ref 0.57–1.00)
Globulin, Total: 2.3 g/dL (ref 1.5–4.5)
Glucose: 75 mg/dL (ref 70–99)
Potassium: 4.1 mmol/L (ref 3.5–5.2)
Sodium: 140 mmol/L (ref 134–144)
Total Protein: 6.8 g/dL (ref 6.0–8.5)
eGFR: 94 mL/min/1.73 (ref >59)

## 2024-07-02 LAB — VITAMIN D 25 HYDROXY (VIT D DEFICIENCY, FRACTURES): Vit D, 25-Hydroxy: 42.6 ng/mL (ref 30.0–100.0)

## 2024-07-02 LAB — VITAMIN B12: Vitamin B-12: 744 pg/mL (ref 232–1245)

## 2024-07-02 LAB — HEMOGLOBIN A1C
Est. average glucose Bld gHb Est-mCnc: 105 mg/dL
Hgb A1c MFr Bld: 5.3 % (ref 4.8–5.6)

## 2024-07-02 LAB — INSULIN, RANDOM: INSULIN: 13.5 u[IU]/mL (ref 2.6–24.9)

## 2024-07-28 ENCOUNTER — Encounter (INDEPENDENT_AMBULATORY_CARE_PROVIDER_SITE_OTHER): Payer: Self-pay | Admitting: Family Medicine

## 2024-07-28 ENCOUNTER — Other Ambulatory Visit (HOSPITAL_COMMUNITY): Payer: Self-pay

## 2024-07-28 ENCOUNTER — Ambulatory Visit (INDEPENDENT_AMBULATORY_CARE_PROVIDER_SITE_OTHER): Admitting: Family Medicine

## 2024-07-28 VITALS — BP 138/84 | HR 75 | Temp 98.2°F | Ht 66.0 in | Wt 171.0 lb

## 2024-07-28 DIAGNOSIS — E559 Vitamin D deficiency, unspecified: Secondary | ICD-10-CM

## 2024-07-28 DIAGNOSIS — E66811 Obesity, class 1: Secondary | ICD-10-CM | POA: Diagnosis not present

## 2024-07-28 DIAGNOSIS — R7303 Prediabetes: Secondary | ICD-10-CM | POA: Diagnosis not present

## 2024-07-28 DIAGNOSIS — E7849 Other hyperlipidemia: Secondary | ICD-10-CM

## 2024-07-28 DIAGNOSIS — G43009 Migraine without aura, not intractable, without status migrainosus: Secondary | ICD-10-CM

## 2024-07-28 DIAGNOSIS — Z6827 Body mass index (BMI) 27.0-27.9, adult: Secondary | ICD-10-CM

## 2024-07-28 DIAGNOSIS — E6609 Other obesity due to excess calories: Secondary | ICD-10-CM

## 2024-07-28 MED ORDER — WEGOVY 1.7 MG/0.75ML ~~LOC~~ SOAJ
1.7000 mg | SUBCUTANEOUS | 0 refills | Status: DC
  Filled 2024-07-28: qty 3, 28d supply, fill #0

## 2024-07-28 NOTE — Progress Notes (Signed)
 [name], D.O.  ABFM, ABOM Specializing in Clinical Bariatric Medicine  Office located at: [address] [place], [place]  [address]      A) FOR THE CHRONIC DISEASE OF OBESITY:  Chief complaint: Obesity Lauren is here to discuss her progress with her obesity treatment plan.   History of present illness / Interval history:  Lauren Wolfe is here today for her follow-up office visit.  Since last OV on 07/01/2024, pt is down 2 lbs.   Reports life has been stressful lately due to work and life.   07/01/24 07:00 07/28/24 12:00   Body Fat % 35.5 % 35 %  Muscle Mass (lbs) 106.6 lbs 106 lbs  Fat Mass (lbs) 61.6 lbs 60.2 lbs  Total Body Water (lbs) 73.4 lbs 74.6 lbs  Visceral Fat Rating  7 7  Counseling done on how various foods will affect these numbers and how to maximize success   Total lbs lost to date: -26 lbs Total Fat Mass in lbs lost to date: -14.8 Total weight loss percentage to date: -13.20 %   Nutrition Therapy She is keeping a food journal and adhering to recommended goals of 1050-1150 calories and 80+ g of protein and the Pescatarian Plan and states she is following her eating plan approximately 50 % of the time.   - Tracking Calories/Macros: yes  - Eating More Whole Foods: yes  - Adequate Protein Intake: yes  - Adequate Water Intake: no -    - Skipping Meals: no -    - Sleeping 7-9 Hours/ Night: yes   Lauren Wolfe is currently in the action stage of change. As such, her goal is to continue weight management plan.  She has agreed to: continue current plan   Physical Activity Pt is walking 60  minutes 4 days per week   Ayanni has been advised to work up to 300-450 minutes of moderate intensity aerobic activity a week and strengthening exercises 2-3 times per week for cardiovascular health, weight loss maintenance and preservation of muscle mass.  She has agreed to : Think about enjoyable ways to increase daily physical activity and  overcoming barriers to exercise, Start strengthening exercises with a goal of 2-3 sessions a week , Increase volume of physical activity to a goal of 240 minutes a week, and Combine aerobic and strengthening exercises for efficiency and improved cardiometabolic health.   Behavioral Modifications Evidence-based interventions for health behavior change were utilized today including the discussion of  1) self monitoring techniques:  none 2) problem-solving barriers:  none 3) self care:  none 4) SMART goals for next OV:  Implement resistance training exercise alongside 60 minutes of walking.  Regarding patient's less desirable eating habits and patterns, we employed the technique of small changes.   We discussed the following today: increasing lean protein intake to established goals, continue to work on implementation of reduced calorie nutritional plan, and discussed pre-packaged healthier meals such as [employer] Natural, Whole 30 chicken meals, Longlife meal prep and Factor Meals etc Additional resources provided today: Handout on resistance training exercises using bands or free weights   Medical Interventions/ Pharmacotherapy Previous Bariatric surgery: none Pharmacotherapy for weight loss: She is currently taking Wegovy  1 mg weekly  for medical weight loss.    We discussed various medication options to help Lauren Wolfe with her weight loss efforts and we both agreed to : See Pre-DM note.    B) OBESITY RELATED CONDITIONS ADDRESSED TODAY:   Class 1 obesity  due to excess calories without serious comorbidity with body mass index (BMI) of 31.0 to 31.9 in adult , start BMI 31.62 BMI 27.0-27.9,adult- current BMI 27.61  Prediabetes Assessment & Plan Lab Results  Component Value Date   HGBA1C 5.3 07/01/2024   HGBA1C 5.3 12/20/2023   HGBA1C 5.2 09/13/2023   INSULIN  13.5 07/01/2024   INSULIN  9.7 09/13/2023   INSULIN  10.3 05/03/2023    Lab Results  Component Value Date   CREATININE  0.78 07/01/2024   BUN 13 07/01/2024   NA 140 07/01/2024   K 4.1 07/01/2024   CL 102 07/01/2024   CO2 23 07/01/2024   Pt was previously on Zepbound  5 mg, but switched to Wegovy  1 mg weekly because insurance stopped covering it. Pt reports hunger and doesn't notice a difference with the Wegovy . Denies side effects. Increase Wegovy  from 1 mg to 1.7 mg weekly because she reports increased hunger. Discussed the possible risks and benefits with the increased dose.   Reviewed labs with pt from 07/01/2024. A1c is well controlled. Cont to decrease simple sugars/carbs. Increase lean protein and advance exercise as possible.   Reviewed CMP, kidney function is well controlled. Cont to increase water intake and exercise as able.     Vitamin D  deficiency Assessment & Plan  Lab Results  Component Value Date   VD25OH 42.6 07/01/2024   VD25OH 48.0 01/17/2024   VD25OH 44.0 09/13/2023  Currently on Ergo 50,000 units daily with good tolerance. Reports sometimes missing doses because she takes it on different days. Reviewed labs with pt and Vit D levels have worsened from 48 to 42.6. Encouraged regular adherence to supplementation. Cont regimen(refill today). Recheck levels in 3 months.     Other hyperlipidemia Assessment & Plan  Lab Results  Component Value Date   CHOL 191 07/01/2024   HDL 76 07/01/2024   LDLCALC 103 (H) 07/01/2024   LDLDIRECT 164.5 09/19/2012   TRIG 63 07/01/2024   CHOLHDL 2.5 07/01/2024      Component Value Date/Time   PROT 6.8 07/01/2024 0816   ALBUMIN 4.5 07/01/2024 0816   AST 14 07/01/2024 0816   ALT 14 07/01/2024 0816   ALKPHOS 74 07/01/2024 0816   BILITOT 0.3 07/01/2024 0816   BILIDIR 0.2 12/20/2023 0852   BILIDIR <0.10 11/02/2021 0847  On Praluent  150 mg every 14 days with reported good compliance and tolerance.  Reviewed labs with pt from 07/01/2024, Cholesterol and HDL are WNL. LDL is elevated at 103; optimal <100. Cont medication per cardiology. Liver function is  under control and stable. Cont decreasing fatty meats, saturated and trans fats.       Medications Discontinued During This Encounter  Medication Reason   semaglutide -weight management (WEGOVY ) 1 MG/0.5ML SOAJ SQ injection    tirzepatide  (ZEPBOUND ) 5 MG/0.5ML Pen      Meds ordered this encounter  Medications   semaglutide -weight management (WEGOVY ) 1.7 MG/0.75ML SOAJ SQ injection    Sig: 1.7 mg Sq injection wkly    Dispense:  3 mL    Refill:  0      Follow up:   Return 09/03/2024 7:40 AM.  She was informed of the importance of frequent follow up visits to maximize her success with intensive lifestyle modifications for her multiple health conditions.   Weight Summary and Biometrics   Weight Lost Since Last Visit: 2lb  Weight Gained Since Last Visit: 0lb    Vitals Temp: 98.2 F (36.8 C) BP: 138/84 Pulse Rate: 75 SpO2: 100 %   Anthropometric  Measurements Height: 5' 6 (1.676 m) Weight: 171 lb (77.6 kg) BMI (Calculated): 27.61 Weight at Last Visit: 173lb Weight Lost Since Last Visit: 2lb Weight Gained Since Last Visit: 0lb Starting Weight: 197lb Total Weight Loss (lbs): 26 lb (11.8 kg)   Body Composition  Body Fat %: 35 % Fat Mass (lbs): 60.2 lbs Muscle Mass (lbs): 106 lbs Total Body Water (lbs): 74.6 lbs Visceral Fat Rating : 7   Other Clinical Data Fasting: no Labs: no Today's Visit #: 18 Starting Date: 01/16/23    Objective:   PHYSICAL EXAM: Blood pressure 138/84, pulse 75, temperature 98.2 F (36.8 C), height 5' 6 (1.676 m), weight 171 lb (77.6 kg), SpO2 100%. Body mass index is 27.6 kg/m.  General: she is overweight, cooperative and in no acute distress. PSYCH: Has normal mood, affect and thought process.   HEENT: EOMI, sclerae are anicteric. Lungs: Normal breathing effort, no conversational dyspnea. Extremities: Moves * 4 Neurologic: A and O * 3, good insight  DIAGNOSTIC DATA REVIEWED: BMET    Component Value Date/Time   NA 140  07/01/2024 0816   K 4.1 07/01/2024 0816   CL 102 07/01/2024 0816   CO2 23 07/01/2024 0816   GLUCOSE 75 07/01/2024 0816   GLUCOSE 79 12/20/2023 0852   BUN 13 07/01/2024 0816   CREATININE 0.78 07/01/2024 0816   CALCIUM  9.5 07/01/2024 0816   GFRNONAA 67 07/30/2020 1051   GFRAA 77 07/30/2020 1051   Lab Results  Component Value Date   HGBA1C 5.3 07/01/2024   HGBA1C 5.3 02/24/2022   Lab Results  Component Value Date   INSULIN  13.5 07/01/2024   INSULIN  10.5 01/16/2023   Lab Results  Component Value Date   TSH 0.99 12/20/2023   CBC    Component Value Date/Time   WBC 4.9 12/20/2023 0852   RBC 5.05 12/20/2023 0852   HGB 15.4 (H) 12/20/2023 0852   HCT 46.2 (H) 12/20/2023 0852   PLT 237.0 12/20/2023 0852   MCV 91.4 12/20/2023 0852   MCH 29.8 11/11/2019 2237   MCHC 33.4 12/20/2023 0852   RDW 13.1 12/20/2023 0852   Iron Studies    Component Value Date/Time   IRON 129 12/17/2019 0926   FERRITIN 348 (H) 10/22/2019 0222   IRONPCTSAT 43.1 12/17/2019 0926   Lipid Panel     Component Value Date/Time   CHOL 191 07/01/2024 0816   TRIG 63 07/01/2024 0816   HDL 76 07/01/2024 0816   CHOLHDL 2.5 07/01/2024 0816   CHOLHDL 3 12/20/2023 0852   VLDL 13.4 12/20/2023 0852   LDLCALC 103 (H) 07/01/2024 0816   LDLDIRECT 164.5 09/19/2012 1052   Hepatic Function Panel     Component Value Date/Time   PROT 6.8 07/01/2024 0816   ALBUMIN 4.5 07/01/2024 0816   AST 14 07/01/2024 0816   ALT 14 07/01/2024 0816   ALKPHOS 74 07/01/2024 0816   BILITOT 0.3 07/01/2024 0816   BILIDIR 0.2 12/20/2023 0852   BILIDIR <0.10 11/02/2021 0847      Component Value Date/Time   TSH 0.99 12/20/2023 0852   Nutritional Lab Results  Component Value Date   VD25OH 42.6 07/01/2024   VD25OH 48.0 01/17/2024   VD25OH 44.0 09/13/2023    Attestations:   I, Feliciano Mingle, acting as a stage manager for Barnie Jenkins, DO., have compiled all relevant documentation for today's office visit on behalf of  Barnie Jenkins, DO, while in the presence of [employer], DO.  I have reviewed the above documentation for accuracy  and completeness, and I agree with the above. Barnie JINNY Jenkins, D.O.  The 21st Century Cures Act was signed into law in 2016 which includes the topic of electronic health records.  This provides immediate access to information in MyChart.  This includes consultation notes, operative notes, office notes, lab results and pathology reports.  If you have any questions about what you read please let us  know at your next visit so we can discuss your concerns and take corrective action if need be.  We are right here with you.

## 2024-07-29 ENCOUNTER — Ambulatory Visit (INDEPENDENT_AMBULATORY_CARE_PROVIDER_SITE_OTHER): Admitting: Family Medicine

## 2024-08-11 ENCOUNTER — Other Ambulatory Visit (HOSPITAL_COMMUNITY): Payer: Self-pay

## 2024-08-12 ENCOUNTER — Telehealth: Payer: Self-pay

## 2024-08-12 ENCOUNTER — Encounter: Payer: Self-pay | Admitting: Internal Medicine

## 2024-08-12 ENCOUNTER — Encounter (INDEPENDENT_AMBULATORY_CARE_PROVIDER_SITE_OTHER): Payer: Self-pay | Admitting: Family Medicine

## 2024-08-13 ENCOUNTER — Other Ambulatory Visit (HOSPITAL_COMMUNITY): Payer: Self-pay

## 2024-08-14 NOTE — Telephone Encounter (Signed)
 SABRA

## 2024-08-26 ENCOUNTER — Other Ambulatory Visit: Payer: Self-pay

## 2024-08-26 ENCOUNTER — Ambulatory Visit (INDEPENDENT_AMBULATORY_CARE_PROVIDER_SITE_OTHER): Admitting: Family Medicine

## 2024-09-03 ENCOUNTER — Encounter (INDEPENDENT_AMBULATORY_CARE_PROVIDER_SITE_OTHER): Payer: Self-pay | Admitting: Family Medicine

## 2024-09-03 ENCOUNTER — Ambulatory Visit (INDEPENDENT_AMBULATORY_CARE_PROVIDER_SITE_OTHER): Payer: Self-pay | Admitting: Family Medicine

## 2024-09-03 ENCOUNTER — Other Ambulatory Visit (HOSPITAL_COMMUNITY): Payer: Self-pay

## 2024-09-03 VITALS — BP 137/89 | HR 76 | Temp 98.2°F | Ht 66.0 in | Wt 171.0 lb

## 2024-09-03 DIAGNOSIS — E559 Vitamin D deficiency, unspecified: Secondary | ICD-10-CM

## 2024-09-03 DIAGNOSIS — Z6827 Body mass index (BMI) 27.0-27.9, adult: Secondary | ICD-10-CM

## 2024-09-03 DIAGNOSIS — R7303 Prediabetes: Secondary | ICD-10-CM

## 2024-09-03 DIAGNOSIS — E66811 Obesity, class 1: Secondary | ICD-10-CM

## 2024-09-03 MED ORDER — WEGOVY 1.7 MG/0.75ML ~~LOC~~ SOAJ
1.7000 mg | SUBCUTANEOUS | 1 refills | Status: DC
  Filled 2024-09-03: qty 3, 28d supply, fill #0
  Filled 2024-09-27: qty 3, 28d supply, fill #1

## 2024-09-03 MED ORDER — VITAMIN D (ERGOCALCIFEROL) 1.25 MG (50000 UNIT) PO CAPS
50000.0000 [IU] | ORAL_CAPSULE | ORAL | 0 refills | Status: DC
  Filled 2024-09-03: qty 4, 28d supply, fill #0
  Filled 2024-09-27: qty 4, 28d supply, fill #1
  Filled 2024-10-24: qty 4, 28d supply, fill #2

## 2024-09-03 NOTE — Progress Notes (Signed)
 Lauren Wolfe, D.O.  ABFM, ABOM Specializing in Clinical Bariatric Medicine  Office located at: [address] [place], [place]  [address]    FOR THE CHRONIC DISEASE OF OBESITY:   Medications Discontinued During This Encounter  Medication Reason   semaglutide -weight management (WEGOVY ) 1.7 MG/0.75ML SOAJ SQ injection Reorder   Vitamin D , Ergocalciferol , (DRISDOL ) 1.25 MG (50000 UNIT) CAPS capsule Reorder     Meds ordered this encounter  Medications   semaglutide -weight management (WEGOVY ) 1.7 MG/0.75ML SOAJ SQ injection    Sig: Inject 1.7 mg into the skin once a week.    Dispense:  3 mL    Refill:  1   Vitamin D , Ergocalciferol , (DRISDOL ) 1.25 MG (50000 UNIT) CAPS capsule    Sig: Take 1 capsule (50,000 Units total) by mouth every 7 (seven) days.    Dispense:  12 capsule    Refill:  0     Class 1 obesity due to excess calories without serious comorbidity with body mass index (BMI) of 31.0 to 31.9 in adult , start BMI 31.62 BMI 27.0-27.9,adult- current BMI 27.61  Weight Summary and Body Composition Analysis  Weight Lost Since Last Visit: 0  Weight Gained Since Last Visit: 0    Vitals Temp: 98.2 F (36.8 C) BP: 137/89 Pulse Rate: 76 SpO2: 100 %   Anthropometric Measurements Height: 5' 6 (1.676 m) Weight: 171 lb (77.6 kg) BMI (Calculated): 27.61 Weight at Last Visit: 171 lbs Weight Lost Since Last Visit: 0 Weight Gained Since Last Visit: 0 Starting Weight: 197 lbs Total Weight Loss (lbs): 26 lb (11.8 kg)   Body Composition  Body Fat %: 35.1 % Fat Mass (lbs): 60.4 lbs Muscle Mass (lbs): 105.8 lbs Total Body Water (lbs): 73.8 lbs Visceral Fat Rating : 7   Other Clinical Data Fasting: no Starting Date: 01/16/23    Chief complaint: Obesity  Interval History Lauren Wolfe is here for a follow-up office visit to discuss her progress with her obesity treatment plan. She is on the Bluelinx and is keeping a food journal and adhering  to recommended goals of 1050-1150 calories and 80+ grams protein. She states she is following her eating plan approximately 75 % of the time. She is walking and strength training 45  minutes 3 days per week  She has maintained her weight since last OV on 07/28/2024.   She enjoyed Thanksgiving with her family and friends.  Her dietary and life habits include:  - Tracking Calories/Macros: yes  - Eating More Whole Foods: yes  - Adequate Protein Intake: yes  - Adequate Water Intake: yes  - Skipping Meals: no  - Sleeping 7-9 Hours/ Night: no    07/28/24 12:00 09/03/24 07:00   Body Fat % 35 % 35.1 %  Muscle Mass (lbs) 106 lbs 105.8 lbs  Fat Mass (lbs) 60.2 lbs 60.4 lbs  Total Body Water (lbs) 74.6 lbs 73.8 lbs  Visceral Fat Rating  7 7   Counseling done on how various foods will affect these numbers and how to maximize success  Total Fat mass lost to date: - 14.6 lbs Total lbs lost to date: - 26 lbs Total weight loss percentage to date: - 10.47 %   Nutritional and Behavioral Counseling:  We discussed the following today: the concept of paying to play (intentionally increasing exercise on days they will deviate from meal plan) , continue to work on implementation of reduced calorie nutritional plan, and celebration eating strategies  Additional resources provided  today: Handout on holiday eating strategies and Handout on December goals   Evidence-based interventions for health behavior change were utilized today including the discussion of self monitoring techniques, problem-solving barriers and SMART goal setting techniques.   Regarding patient's less desirable eating habits and patterns, we employed the technique of small changes.   SMART Goal(s) created today: maintain weight through holidays   Recommended Dietary Goals Lauren Wolfe is currently in the action stage of change. As such, her goal is to continue weight management plan.  She has agreed to continue the  Bluelinx and journaling 1050-1150 calories and 80+ grams protein.   Recommended Physical Activity Goals Lauren Wolfe has been advised to work up to 300-450 minutes of moderate intensity aerobic activity a week and strengthening exercises 2-3 times per week for cardiovascular health, weight loss maintenance and preservation of muscle mass.   She has agreed to: Continue to gradually increase the amount and intensity of exercise routine   Medical Interventions and Pharmacotherapy Previous Bariatric surgery: n/a  Pharmacotherapy: Was previously on Zepbound  but insurance stopped coverage of med. She later transitioned to Wegovy  and is on the 1.7 mg weekly dose. Good control of hunger, cravings, and food noise. Drug induced constipation controlled with Miralax prn. Encouraged adequate hydration and fiber intake. Cont Wegovy  at same dose; will reorder med, CMA asked to resubmit prior authorization.   OBESITY RELATED CONDITIONS ADDRESSED TODAY:    Medications Discontinued During This Encounter  Medication Reason   semaglutide -weight management (WEGOVY ) 1.7 MG/0.75ML SOAJ SQ injection Reorder   Vitamin D , Ergocalciferol , (DRISDOL ) 1.25 MG (50000 UNIT) CAPS capsule Reorder     Meds ordered this encounter  Medications   semaglutide -weight management (WEGOVY ) 1.7 MG/0.75ML SOAJ SQ injection    Sig: Inject 1.7 mg into the skin once a week.    Dispense:  3 mL    Refill:  1   Vitamin D , Ergocalciferol , (DRISDOL ) 1.25 MG (50000 UNIT) CAPS capsule    Sig: Take 1 capsule (50,000 Units total) by mouth every 7 (seven) days.    Dispense:  12 capsule    Refill:  0     Prediabetes Assessment & Plan: Lab Results  Component Value Date   HGBA1C 5.3 07/01/2024   HGBA1C 5.3 12/20/2023   HGBA1C 5.2 09/13/2023   INSULIN  13.5 07/01/2024   INSULIN  9.7 09/13/2023   INSULIN  10.3 05/03/2023    Pre-DM managed with dietary and lifestyle interventions, she is also on a GLP-1 to aid with weight loss. No  complaints of excessive hunger or cravings. Continue working on nutrition plan to decrease simple carbohydrates, increase lean proteins and exercise to promote weight loss and improve glycemic control and prevent progression to T2DM.     Vitamin D  deficiency Assessment & Plan: Lab Results  Component Value Date   VD25OH 42.6 07/01/2024   VD25OH 48.0 01/17/2024   VD25OH 44.0 09/13/2023   She is doing well on once weekly strength Vit D; continue wt loss efforts and supplementation.  Recheck as deemed medically necessary.   Objective:   PHYSICAL EXAM: Blood pressure 137/89, pulse 76, temperature 98.2 F (36.8 C), height 5' 6 (1.676 m), weight 171 lb (77.6 kg), SpO2 100%. Body mass index is 27.6 kg/m.  General: she is overweight, cooperative and in no acute distress. PSYCH: Has normal mood, affect and thought process.   HEENT: EOMI, sclerae are anicteric. Lungs: Normal breathing effort, no conversational dyspnea. Extremities: Moves * 4 Neurologic: A and O * 3, good insight  DIAGNOSTIC DATA  REVIEWED: BMET    Component Value Date/Time   NA 140 07/01/2024 0816   K 4.1 07/01/2024 0816   CL 102 07/01/2024 0816   CO2 23 07/01/2024 0816   GLUCOSE 75 07/01/2024 0816   GLUCOSE 79 12/20/2023 0852   BUN 13 07/01/2024 0816   CREATININE 0.78 07/01/2024 0816   CALCIUM  9.5 07/01/2024 0816   GFRNONAA 67 07/30/2020 1051   GFRAA 77 07/30/2020 1051   Lab Results  Component Value Date   HGBA1C 5.3 07/01/2024   HGBA1C 5.3 02/24/2022   Lab Results  Component Value Date   INSULIN  13.5 07/01/2024   INSULIN  10.5 01/16/2023   Lab Results  Component Value Date   TSH 0.99 12/20/2023   CBC    Component Value Date/Time   WBC 4.9 12/20/2023 0852   RBC 5.05 12/20/2023 0852   HGB 15.4 (H) 12/20/2023 0852   HCT 46.2 (H) 12/20/2023 0852   PLT 237.0 12/20/2023 0852   MCV 91.4 12/20/2023 0852   MCH 29.8 11/11/2019 2237   MCHC 33.4 12/20/2023 0852   RDW 13.1 12/20/2023 0852   Iron  Studies    Component Value Date/Time   IRON 129 12/17/2019 0926   FERRITIN 348 (H) 10/22/2019 0222   IRONPCTSAT 43.1 12/17/2019 0926   Lipid Panel     Component Value Date/Time   CHOL 191 07/01/2024 0816   TRIG 63 07/01/2024 0816   HDL 76 07/01/2024 0816   CHOLHDL 2.5 07/01/2024 0816   CHOLHDL 3 12/20/2023 0852   VLDL 13.4 12/20/2023 0852   LDLCALC 103 (H) 07/01/2024 0816   LDLDIRECT 164.5 09/19/2012 1052   Hepatic Function Panel     Component Value Date/Time   PROT 6.8 07/01/2024 0816   ALBUMIN 4.5 07/01/2024 0816   AST 14 07/01/2024 0816   ALT 14 07/01/2024 0816   ALKPHOS 74 07/01/2024 0816   BILITOT 0.3 07/01/2024 0816   BILIDIR 0.2 12/20/2023 0852   BILIDIR <0.10 11/02/2021 0847      Component Value Date/Time   TSH 0.99 12/20/2023 0852   Nutritional Lab Results  Component Value Date   VD25OH 42.6 07/01/2024   VD25OH 48.0 01/17/2024   VD25OH 44.0 09/13/2023     Follow up:   Return 10/07/2024 at 12:20 PM.  She was informed of the importance of frequent follow up visits to maximize her success with intensive lifestyle modifications for her multiple health conditions.   Attestations:   I, Special Puri, acting as a stage manager for [employer], DO., have compiled all relevant documentation for today's office visit on behalf of Lauren Jenkins, DO, while in the presence of [employer], DO.  Pertinent positives were addressed with patient today. Reviewed by clinician on day of visit: allergies, medications, problem list, medical history, surgical history, family history, social history, and previous encounter notes.  I have reviewed the above documentation for accuracy and completeness, and I agree with the above. Lauren Wolfe, D.O.  The 21st Century Cures Act was signed into law in 2016 which includes the topic of electronic health records.  This provides immediate access to information in MyChart. This includes consultation notes, operative notes,  office notes, lab results and pathology reports.  If you have any questions about what you read please let us  know at your next visit so we can discuss your concerns and take corrective action if need be.  We are right here with you.

## 2024-09-04 ENCOUNTER — Telehealth (INDEPENDENT_AMBULATORY_CARE_PROVIDER_SITE_OTHER): Payer: Self-pay | Admitting: *Deleted

## 2024-09-04 NOTE — Telephone Encounter (Signed)
 Untitled Request (Key: [id number])  The demographic data for this request is being submitted to [employer].

## 2024-09-05 ENCOUNTER — Other Ambulatory Visit (HOSPITAL_COMMUNITY): Payer: Self-pay

## 2024-09-08 ENCOUNTER — Other Ambulatory Visit: Payer: Self-pay | Admitting: Pharmacy Technician

## 2024-09-08 ENCOUNTER — Other Ambulatory Visit: Payer: Self-pay

## 2024-09-08 NOTE — Progress Notes (Signed)
 Specialty Pharmacy Refill Coordination Note  Lauren Wolfe is a 48 y.o. female assessed today regarding refills of clinic administered specialty medication(s) OnabotulinumtoxinA  (Botox )   Clinic requested Courier to Provider Office   Delivery date: 09/11/24   Verified address: [facility]  [address]   Medication will be filled on: 09/10/24

## 2024-09-11 ENCOUNTER — Other Ambulatory Visit: Payer: Self-pay

## 2024-09-11 NOTE — Progress Notes (Signed)
 System error cause medication to not fill on original planned date. Fill 12.12.25 and courier to [facility]  12.15.25 for 12.17.25 appointment.

## 2024-09-15 ENCOUNTER — Other Ambulatory Visit: Payer: Self-pay

## 2024-09-16 ENCOUNTER — Telehealth (INDEPENDENT_AMBULATORY_CARE_PROVIDER_SITE_OTHER): Payer: Self-pay

## 2024-09-16 ENCOUNTER — Ambulatory Visit: Admitting: Neurology

## 2024-09-16 NOTE — Telephone Encounter (Signed)
 PA for Wegovy  1.7 has been approved. PA is now complete.     Your prior authorization for Wegovy  has been approved! More Info Personalized support and financial assistance may be available through the [employer] program. For more information, and to see program requirements, click on the More Info button to the right.  Message from plan: Your PA request has been approved. Additional information will be provided in the approval communication. (Message 1145). Authorization Expiration Date: April 16, 2025.

## 2024-09-16 NOTE — Telephone Encounter (Signed)
 PA questions for Wegovy  1.7MG   have been answered and all documentation has been included. Waiting on a determination.

## 2024-09-16 NOTE — Telephone Encounter (Signed)
 PA for Wegovy  1.7MG  has been submitted, awaiting PA questions. This is the 3rd submission.

## 2024-09-17 ENCOUNTER — Ambulatory Visit: Admitting: Neurology

## 2024-09-17 ENCOUNTER — Encounter: Payer: Self-pay | Admitting: Neurology

## 2024-09-17 VITALS — BP 143/104 | HR 79

## 2024-09-17 DIAGNOSIS — G43709 Chronic migraine without aura, not intractable, without status migrainosus: Secondary | ICD-10-CM | POA: Diagnosis not present

## 2024-09-17 DIAGNOSIS — G43811 Other migraine, intractable, with status migrainosus: Secondary | ICD-10-CM

## 2024-09-17 MED ORDER — ONABOTULINUMTOXINA 200 UNITS IJ SOLR
155.0000 [IU] | Freq: Once | INTRAMUSCULAR | Status: AC
  Administered 2024-09-17: 09:00:00 155 [IU] via INTRAMUSCULAR

## 2024-09-17 NOTE — Progress Notes (Signed)
 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

## 2024-09-17 NOTE — Progress Notes (Signed)
° °  BOTOX  PROCEDURE NOTE FOR MIGRAINE HEADACHE   HISTORY: Lauren Wolfe is here for Botox . Last was 06/12/24 with me. Does great once Botox  starts working, has migraines 2-3 weeks after injection about 1-2 weeks before next cycle. Uses Nurtec during those times. Did not like Zavzpret , burned her nose.   Description of procedure:  The patient was placed in a sitting position. The standard protocol was used for Botox  as follows, with 5 units of Botox  injected at each site:   -Procerus muscle, midline injection  -Corrugator muscle, bilateral injection  -Frontalis muscle, bilateral injection, with 2 sites each side, medial injection was performed in the upper one third of the frontalis muscle, in the region vertical from the medial inferior edge of the superior orbital rim. The lateral injection was again in the upper one third of the forehead vertically above the lateral limbus of the cornea, 1.5 cm lateral to the medial injection site.  -Temporalis muscle injection, 4 sites, bilaterally. The first injection was 3 cm above the tragus of the ear, second injection site was 1.5 cm to 3 cm up from the first injection site in line with the tragus of the ear. The third injection site was 1.5-3 cm forward between the first 2 injection sites. The fourth injection site was 1.5 cm posterior to the second injection site.  -Occipitalis muscle injection, 3 sites, bilaterally. The first injection was done one half way between the occipital protuberance and the tip of the mastoid process behind the ear. The second injection site was done lateral and superior to the first, 1 fingerbreadth from the first injection. The third injection site was 1 fingerbreadth superiorly and medially from the first injection site.  -Cervical paraspinal muscle injection, 2 sites, bilateral, the first injection site was 1 cm from the midline of the cervical spine, 3 cm inferior to the lower border of the occipital protuberance.  The second injection site was 1.5 cm superiorly and laterally to the first injection site.  -Trapezius muscle injection was performed at 3 sites, bilaterally. The first injection site was in the upper trapezius muscle halfway between the inflection point of the neck, and the acromion. The second injection site was one half way between the acromion and the first injection site. The third injection was done between the first injection site and the inflection point of the neck.   A 200 unit bottle of Botox  was used, 155 units were injected, the rest of the Botox  was wasted. The patient tolerated the procedure well, there were no complications of the above procedure.  Botox  NDC 9976-6078-97 Lot number [id number] Expiration date 12/01/2026 SP

## 2024-09-19 ENCOUNTER — Other Ambulatory Visit: Payer: Self-pay | Admitting: Neurology

## 2024-09-19 DIAGNOSIS — R110 Nausea: Secondary | ICD-10-CM

## 2024-09-29 ENCOUNTER — Telehealth: Payer: Self-pay

## 2024-09-29 NOTE — Telephone Encounter (Signed)
" ° °  Approval letter has been placed in chart under media tab. "

## 2024-09-29 NOTE — Telephone Encounter (Signed)
 Thank you!  Auth#: [id number] (09/29/24-09/29/25)

## 2024-10-02 ENCOUNTER — Encounter: Payer: Self-pay | Admitting: Cardiovascular Disease

## 2024-10-02 ENCOUNTER — Other Ambulatory Visit: Payer: Self-pay | Admitting: Neurology

## 2024-10-06 ENCOUNTER — Encounter: Payer: Self-pay | Admitting: Cardiovascular Disease

## 2024-10-06 ENCOUNTER — Encounter: Payer: Self-pay | Admitting: Neurology

## 2024-10-06 MED ORDER — NURTEC 75 MG PO TBDP: 75.0000 mg | ORAL_TABLET | ORAL | 11 refills | Status: AC

## 2024-10-07 ENCOUNTER — Encounter (INDEPENDENT_AMBULATORY_CARE_PROVIDER_SITE_OTHER): Payer: Self-pay | Admitting: Family Medicine

## 2024-10-07 ENCOUNTER — Ambulatory Visit (INDEPENDENT_AMBULATORY_CARE_PROVIDER_SITE_OTHER): Payer: Self-pay | Admitting: Family Medicine

## 2024-10-07 ENCOUNTER — Other Ambulatory Visit (HOSPITAL_COMMUNITY): Payer: Self-pay

## 2024-10-07 VITALS — BP 123/88 | HR 83 | Temp 98.0°F | Ht 66.0 in | Wt 170.0 lb

## 2024-10-07 DIAGNOSIS — R7303 Prediabetes: Secondary | ICD-10-CM

## 2024-10-07 DIAGNOSIS — E559 Vitamin D deficiency, unspecified: Secondary | ICD-10-CM | POA: Diagnosis not present

## 2024-10-07 DIAGNOSIS — E66811 Obesity, class 1: Secondary | ICD-10-CM

## 2024-10-07 DIAGNOSIS — Z6827 Body mass index (BMI) 27.0-27.9, adult: Secondary | ICD-10-CM | POA: Diagnosis not present

## 2024-10-07 MED ORDER — WEGOVY 1.7 MG/0.75ML ~~LOC~~ SOAJ
1.7000 mg | SUBCUTANEOUS | 1 refills | Status: DC
  Filled 2024-10-07 – 2024-10-24 (×2): qty 3, 28d supply, fill #0

## 2024-10-07 NOTE — Progress Notes (Signed)
 "  [name], D.O.  ABFM, ABOM Specializing in Clinical Bariatric Medicine  Office located at: [address] [place], [place]  [address]    Obtain fasting labs at next OV.  A) FOR THE CHRONIC DISEASE OF OBESITY:  Chief complaint: Obesity Lauren Wolfe is here to discuss her progress with her obesity treatment plan.   History of present illness / Interval history:  Lauren Wolfe is here today for her follow-up office visit.  Since last OV on 09/03/24, pt is down 1 lb. Patient states that she feels like she did terrible over the holidays. She reports that she skipped meals and was sleeping and was vacationing.    09/03/24 07:00 10/07/24 12:00   Body Fat % 35.1 % 35.2 %  Muscle Mass (lbs) 105.8 lbs 105 lbs  Fat Mass (lbs) 60.4 lbs 60 lbs  Total Body Water (lbs) 73.8 lbs 75.6 lbs  Visceral Fat Rating  7 7    Counseling done on how various foods will affect these numbers and how to maximize success.  Total lbs lost to date: - 27 lbs Total Fat Mass in lbs lost to date: - 15 lbs Total weight loss percentage to date: - 13.71 %    Class 1 obesity with a (BMI) of 31.0 to 31.9 in adult , start BMI 31.62 BMI 27.0-27.9,adult- current BMI 27.45  Nutrition Therapy She is on Pescatarian Plan and journaling 1050-1150 calories and 80+ grams protein and states she is following her eating plan approximately 50 % of the time.   - Tracking Calories/Macros: no   - Eating More Whole Foods: yes  - Adequate Protein Intake: no  - Adequate Water Intake: no   - Skipping Meals: yes  - Sleeping 7-9 Hours/ Night: yes   Mersades is currently in the action stage of change. As such, her goal is to continue weight management plan.  She has agreed to: continue current plan   Physical Activity Nikitta is walking 30-45  minutes 4 days per week   Shawnese has been advised to work up to 300-450 minutes of moderate intensity aerobic activity a week and strengthening exercises 2-3  times per week for cardiovascular health, weight loss maintenance and preservation of muscle mass.  She has agreed to : Increase volume of physical activity to a goal of 240 minutes a week and Combine aerobic and strengthening exercises for efficiency and improved cardiometabolic health.   Behavioral Modifications Evidence-based interventions for health behavior change were utilized today including the discussion of   1) problem-solving barriers:    - Begin Routine again  2) self care:    - Begin exercise again  3) SMART goals for next OV:    - Journal through the weight watcher app   Regarding patient's less desirable eating habits and patterns, we employed the technique of small changes.   We discussed the following today: continue to work on maintaining a reduced calorie state, getting the recommended amount of protein, incorporating whole foods, making healthy choices, staying well hydrated and practicing mindfulness when eating., going to the skinnytaste website for recipe ideas, and using GPT or another AI platform for recipe ideas- searching low calorie, low carb, high protein chicken recipes etc Additional resources provided today: None   Medical Interventions/ Pharmacotherapy Previous Bariatric surgery: n/a Pharmacotherapy for weight loss: She is currently taking Wegovy  1.7 mg once weekly for medical weight loss.    On Wegovy  1.7 mg once weekly with reported good compliance and tolerance.  Patience denies having any side effects. She reports having some constipation but it taking Miralax that helps relieve the constipation. She is working on her water intake. No complaints of excessive hunger or cravings. Will refill wegovy  today. Continue following prudent meal plan.   We discussed various medication options to help Lauren Wolfe with her weight loss efforts and we both agreed to : Adequate clinical response to anti-obesity medication, continue current anti-obesity regimen   B)  OBESITY RELATED CONDITIONS ADDRESSED TODAY:  Prediabetes Assessment & Plan Lab Results  Component Value Date   HGBA1C 5.3 07/01/2024   HGBA1C 5.3 12/20/2023   HGBA1C 5.2 09/13/2023   INSULIN  13.5 07/01/2024   INSULIN  9.7 09/13/2023   INSULIN  10.3 05/03/2023    Managed with diet and life style. No complaints of excessive hunger or cravings. Reminded patient of the importance of eating on meal and focusing on proteins. As patient eats more protein and less carbs insulin  levels will continue to stabilize and her cravings will lessen. Continue to follow nutritional meal plan and decreasing simple carbs and sugars. Will obtain labs at next OV.     Vitamin D  deficiency Assessment & Plan Lab Results  Component Value Date   VD25OH 42.6 07/01/2024   VD25OH 48.0 01/17/2024   VD25OH 44.0 09/13/2023   Taking ERGO 50k units once weekly. With reported good compliance and tolerance.  Reminded patient of the importance of at goal Vit D levels. No acute concerns today. Continue with supplementation. Will obtain labs at next OV.     Medications Discontinued During This Encounter  Medication Reason   semaglutide -weight management (WEGOVY ) 1.7 MG/0.75ML SOAJ SQ injection Reorder     Meds ordered this encounter  Medications   semaglutide -weight management (WEGOVY ) 1.7 MG/0.75ML SOAJ SQ injection    Sig: Inject 1.7 mg into the skin once a week.    Dispense:  3 mL    Refill:  1     Follow up:   Return 11/04/2024 at 8:00 AM  She was informed of the importance of frequent follow up visits to maximize her success with intensive lifestyle modifications for her multiple health conditions.   Weight Summary and Biometrics   Weight Lost Since Last Visit: 1lb  Weight Gained Since Last Visit: 0lb   Vitals Temp: 98 F (36.7 C) BP: 123/88 Pulse Rate: 83 SpO2: 99 %   Anthropometric Measurements Height: 5' 6 (1.676 m) Weight: 170 lb (77.1 kg) BMI (Calculated): 27.45 Weight at Last Visit:  171lb Weight Lost Since Last Visit: 1lb Weight Gained Since Last Visit: 0lb Starting Weight: 197lb Total Weight Loss (lbs): 27 lb (12.2 kg)   Body Composition  Body Fat %: 35.2 % Fat Mass (lbs): 60 lbs Muscle Mass (lbs): 105 lbs Total Body Water (lbs): 75.6 lbs Visceral Fat Rating : 7   Other Clinical Data Fasting: no Labs: no Today's Visit #: 20 Starting Date: 01/16/23    Objective:   PHYSICAL EXAM: Blood pressure 123/88, pulse 83, temperature 98 F (36.7 C), height 5' 6 (1.676 m), weight 170 lb (77.1 kg), SpO2 99%. Body mass index is 27.44 kg/m.  General: she is overweight, cooperative and in no acute distress. PSYCH: Has normal mood, affect and thought process.   HEENT: EOMI, sclerae are anicteric. Lungs: Normal breathing effort, no conversational dyspnea. Extremities: Moves * 4 Neurologic: A and O * 3, good insight  DIAGNOSTIC DATA REVIEWED: BMET    Component Value Date/Time   NA 140 07/01/2024 0816   K 4.1 07/01/2024  0816   CL 102 07/01/2024 0816   CO2 23 07/01/2024 0816   GLUCOSE 75 07/01/2024 0816   GLUCOSE 79 12/20/2023 0852   BUN 13 07/01/2024 0816   CREATININE 0.78 07/01/2024 0816   CALCIUM  9.5 07/01/2024 0816   GFRNONAA 67 07/30/2020 1051   GFRAA 77 07/30/2020 1051   Lab Results  Component Value Date   HGBA1C 5.3 07/01/2024   HGBA1C 5.3 02/24/2022   Lab Results  Component Value Date   INSULIN  13.5 07/01/2024   INSULIN  10.5 01/16/2023   Lab Results  Component Value Date   TSH 0.99 12/20/2023   CBC    Component Value Date/Time   WBC 4.9 12/20/2023 0852   RBC 5.05 12/20/2023 0852   HGB 15.4 (H) 12/20/2023 0852   HCT 46.2 (H) 12/20/2023 0852   PLT 237.0 12/20/2023 0852   MCV 91.4 12/20/2023 0852   MCH 29.8 11/11/2019 2237   MCHC 33.4 12/20/2023 0852   RDW 13.1 12/20/2023 0852   Iron Studies    Component Value Date/Time   IRON 129 12/17/2019 0926   FERRITIN 348 (H) 10/22/2019 0222   IRONPCTSAT 43.1 12/17/2019 0926   Lipid  Panel     Component Value Date/Time   CHOL 191 07/01/2024 0816   TRIG 63 07/01/2024 0816   HDL 76 07/01/2024 0816   CHOLHDL 2.5 07/01/2024 0816   CHOLHDL 3 12/20/2023 0852   VLDL 13.4 12/20/2023 0852   LDLCALC 103 (H) 07/01/2024 0816   LDLDIRECT 164.5 09/19/2012 1052   Hepatic Function Panel     Component Value Date/Time   PROT 6.8 07/01/2024 0816   ALBUMIN 4.5 07/01/2024 0816   AST 14 07/01/2024 0816   ALT 14 07/01/2024 0816   ALKPHOS 74 07/01/2024 0816   BILITOT 0.3 07/01/2024 0816   BILIDIR 0.2 12/20/2023 0852   BILIDIR <0.10 11/02/2021 0847      Component Value Date/Time   TSH 0.99 12/20/2023 0852   Nutritional Lab Results  Component Value Date   VD25OH 42.6 07/01/2024   VD25OH 48.0 01/17/2024   VD25OH 44.0 09/13/2023    Attestations:   I, Sonny Laroche, acting as a stage manager for Barnie Jenkins, DO., have compiled all relevant documentation for today's office visit on behalf of Barnie Jenkins, DO, while in the presence of [employer], DO.  I have reviewed the above documentation for accuracy and completeness, and I agree with the above. Barnie JINNY Jenkins, D.O.  The 21st Century Cures Act was signed into law in 2016 which includes the topic of electronic health records.  This provides immediate access to information in MyChart.  This includes consultation notes, operative notes, office notes, lab results and pathology reports.  If you have any questions about what you read please let us  know at your next visit so we can discuss your concerns and take corrective action if need be.  We are right here with you.  "

## 2024-10-13 ENCOUNTER — Encounter: Payer: Self-pay | Admitting: Internal Medicine

## 2024-10-15 NOTE — Telephone Encounter (Signed)
 This has been addressed in another Mychart message.

## 2024-10-24 ENCOUNTER — Other Ambulatory Visit (HOSPITAL_COMMUNITY): Payer: Self-pay

## 2024-10-24 ENCOUNTER — Other Ambulatory Visit: Payer: Self-pay

## 2024-10-24 ENCOUNTER — Encounter: Payer: Self-pay | Admitting: Cardiovascular Disease

## 2024-10-24 DIAGNOSIS — M791 Myalgia, unspecified site: Secondary | ICD-10-CM

## 2024-10-24 DIAGNOSIS — E7849 Other hyperlipidemia: Secondary | ICD-10-CM

## 2024-10-24 DIAGNOSIS — E7800 Pure hypercholesterolemia, unspecified: Secondary | ICD-10-CM

## 2024-10-24 MED ORDER — PRALUENT 150 MG/ML ~~LOC~~ SOAJ
150.0000 mg | SUBCUTANEOUS | 11 refills | Status: AC
  Filled 2024-10-24: qty 2, 28d supply, fill #0

## 2024-11-03 ENCOUNTER — Encounter (INDEPENDENT_AMBULATORY_CARE_PROVIDER_SITE_OTHER): Payer: Self-pay | Admitting: Family Medicine

## 2024-11-03 ENCOUNTER — Telehealth (INDEPENDENT_AMBULATORY_CARE_PROVIDER_SITE_OTHER): Admitting: Family Medicine

## 2024-11-03 ENCOUNTER — Other Ambulatory Visit (HOSPITAL_COMMUNITY): Payer: Self-pay

## 2024-11-03 VITALS — Ht 66.0 in | Wt 170.0 lb

## 2024-11-03 DIAGNOSIS — Z6827 Body mass index (BMI) 27.0-27.9, adult: Secondary | ICD-10-CM

## 2024-11-03 DIAGNOSIS — R7303 Prediabetes: Secondary | ICD-10-CM

## 2024-11-03 DIAGNOSIS — E559 Vitamin D deficiency, unspecified: Secondary | ICD-10-CM

## 2024-11-03 DIAGNOSIS — E6609 Other obesity due to excess calories: Secondary | ICD-10-CM

## 2024-11-03 MED ORDER — VITAMIN D (ERGOCALCIFEROL) 1.25 MG (50000 UNIT) PO CAPS
50000.0000 [IU] | ORAL_CAPSULE | ORAL | 0 refills | Status: AC
  Filled 2024-11-06: qty 4, 28d supply, fill #0

## 2024-11-03 MED ORDER — WEGOVY 1.7 MG/0.75ML ~~LOC~~ SOAJ
1.7000 mg | SUBCUTANEOUS | 1 refills | Status: AC
  Filled 2024-11-06: qty 3, 28d supply, fill #0

## 2024-11-03 NOTE — Progress Notes (Signed)
 "  Lauren Wolfe, D.O.  ABFM, ABOM Specializing in Clinical Bariatric Medicine  Office located at: [address] [place], [place]  [address]    Medications Discontinued During This Encounter  Medication Reason   Vitamin D , Ergocalciferol , (DRISDOL ) 1.25 MG (50000 UNIT) CAPS capsule Reorder   semaglutide -weight management (WEGOVY ) 1.7 MG/0.75ML SOAJ SQ injection Reorder     Meds ordered this encounter  Medications   semaglutide -weight management (WEGOVY ) 1.7 MG/0.75ML SOAJ SQ injection    Sig: Inject 1.7 mg into the skin once a week.    Dispense:  3 mL    Refill:  1   Vitamin D , Ergocalciferol , (DRISDOL ) 1.25 MG (50000 UNIT) CAPS capsule    Sig: Take 1 capsule (50,000 Units total) by mouth every 7 (seven) days.    Dispense:  12 capsule    Refill:  0      I connected with Lauren Wolfe on 11/03/24 at 12:20 PM EST by video visit and verified that I am speaking with the correct person using two identifiers.  Telemedicine video visit started, but due to technical difficulty we transitioned to telephone visit.  I discussed the limitations, risks, security and privacy concerns of performing an evaluation and management service by telemedicine and the availability of in-person appointments. I also discussed with the patient that there may be a patient responsible charge related to this service. The patient expressed understanding and agreed to proceed.   Other persons participating in the visit and their role in the encounter: medical scribe   Patients location: home  Providers location: home  FOR THE CHRONIC DISEASE OF OBESITY: Class 1 obesity with a (BMI) of 31.0 to 31.9 in adult , start BMI 31.62 BMI 27.0-27.9,adult- current BMI 27.45  Chief complaint: Obesity Lauren Wolfe is here to discuss her progress with her obesity treatment plan.   History of present illness / Interval history:  Lauren Wolfe is here today for her follow-up office visit.  Since last OV on  10/07/2024 with provider: Dr. Jenkins.  patient thinks she gained about 3 lbs.  Recent challenges/ barriers to successful adherence of program recommendations:  Pt reports it has been a struggle to adhere to MP due to work and life stressors. --> fair adherence to reduced calorie nutritional plan.  Pt has been struggling with:  [x]  meal planning and prepping []  exercise []  sleep [x]  Stressors - life and work []  mood []  chronic or acute medical conditions []  eating out more []  Nothing, doing great  Pt has been working on and improving their:  [x]  meal planning and prepping []  exercise []  sleep hygiene []  water intake []  strategies to better manage personal stressors []  strategies to better manage mood []  eating out less []  Nothing particular at this time  When asked by CMA prior to our office visit today, pt states they have been:  - Focused on eating fresh, unprocessed foods?  Yes   - Focused on eating lean proteins with each meal?  Yes   - Sleeping 7-9 Hours/ Night?  No - Skipping Meals?  No   - States she is following her healthy eating plan approximately 50 % of the time.   Recent weight loss data history  SYNOPSIS DATA NOT RECORDED AS THIS WAS A VIRTUAL VISIT.   Physician directed Nutrition Therapy prescription: She is on Pescatarian Plan and journaling 1050-1150 calories and 80+ grams protein.    [name] is currently in the action stage of change. As such, her  goal is to continue weight management plan.  She has agreed to: continue current reduced-calorie meal plan  Physician directed Behavioral Modification prescription: Evidence-based interventions for healthy behavior change were utilized today including the discussion of small changes and SMART goals.  We discussed the following today: increasing lean protein intake to established goals, work on meal planning and preparation, and continue to work on implementation of reduced calorie nutritional  plan  Additional resources provided: None   Physician directed Physical Activity prescription: Pt is walking 60  minutes 3 days per week   Exercise prescription: She has agreed to [employer] training exercises with a goal of 2-3 sessions a week  and Continue to gradually increase the amount and intensity of exercise routine    Medical Interventions/ Pharmacotherapy Previous Bariatric surgery: none  Pharmacotherapy for weight loss: She is currently taking Wegovy  once weekly for medical weight loss.   Pt is on Wegovy  and is doing well on medication.  Reports slight hunger and cravings.  No reported concerns today. Encouraged to focus on whole foods and lean proteins. Cont medication on current dose.(Refill today.)  We discussed various medication options to help Lauren Wolfe with her weight loss efforts and we both agreed to:  Continue with current nutritional and behavioral strategies  SPECIFIC behavorial / nutritional / exercise goals for next office visit:  plans to increase exercise.  Specifically:  Add strength training twice weekly to exercise regimen.   B) OBESITY RELATED CONDITIONS ADDRESSED TODAY:  Obtain fasting labs at next OV.   Prediabetes Assessment & Plan Lab Results  Component Value Date   HGBA1C 5.3 07/01/2024   HGBA1C 5.3 12/20/2023   HGBA1C 5.2 09/13/2023   INSULIN  13.5 07/01/2024   INSULIN  9.7 09/13/2023   INSULIN  10.3 05/03/2023  Condition is managed with dietary and lifestyle interventions. Reports slight hunger and cravings. Pt reports no concerns today. Cont to decrease simple carbs, increase lean proteins, and increase exercise as able to improve glycemic control, promote weight loss, and prevent progression to T2DM. Will obtain fasting labs at next OV.    Vitamin D  deficiency Assessment & Plan Lab Results  Component Value Date   VD25OH 42.6 07/01/2024   VD25OH 48.0 01/17/2024   VD25OH 44.0 09/13/2023  Currently on Ergo once weekly with good  compliance and tolerance. Pt reports no acute concerns today. Cont regimen(refill today). Will obtain labs at next OV.      Follow up:   Return in about 4 weeks (around 12/01/2024).   She was informed of the importance of frequent follow up visits to maximize her success with intensive lifestyle modifications for her multiple health conditions.    Weight Summary and Biometrics   Weight Lost Since Last Visit: 0  Weight Gained Since Last Visit: 0    Vitals Temp: -- (Virtual) BP: -- (Virtual) Pulse Rate: -- (Virtual) SpO2: -- (Virtual)   Anthropometric Measurements Height: 5' 6 (1.676 m) Weight: 170 lb (77.1 kg) BMI (Calculated): 27.45 Weight at Last Visit: 170lb Weight Lost Since Last Visit: 0 Weight Gained Since Last Visit: 0 Starting Weight: 197lb Total Weight Loss (lbs): 27 lb (12.2 kg)   Body Composition  Body Fat %: -- (virtual) Fat Mass (lbs): -- (Virtual) Muscle Mass (lbs): -- (Virtual) Total Body Water (lbs): -- (virtual) Visceral Fat Rating : -- (virtual)   Other Clinical Data Fasting: no Labs: no Today's Visit #: 21 Starting Date: 01/16/23    Objective:   PHYSICAL EXAM: Height 5' 6 (1.676 m), weight 170  lb (77.1 kg). Body mass index is 27.44 kg/m. General: she is overweight, cooperative and in no acute distress. PSYCH: Has normal mood, affect and thought process.   HEENT: EOMI, sclerae are anicteric. Lungs: Normal breathing effort, no conversational dyspnea. Extremities: Moves * 4 Neurologic: A and O * 3, good insight  DIAGNOSTIC DATA REVIEWED: BMET    Component Value Date/Time   NA 140 07/01/2024 0816   K 4.1 07/01/2024 0816   CL 102 07/01/2024 0816   CO2 23 07/01/2024 0816   GLUCOSE 75 07/01/2024 0816   GLUCOSE 79 12/20/2023 0852   BUN 13 07/01/2024 0816   CREATININE 0.78 07/01/2024 0816   CALCIUM  9.5 07/01/2024 0816   GFRNONAA 67 07/30/2020 1051   GFRAA 77 07/30/2020 1051   Lab Results  Component Value Date   HGBA1C 5.3  07/01/2024   HGBA1C 5.3 02/24/2022   Lab Results  Component Value Date   INSULIN  13.5 07/01/2024   INSULIN  10.5 01/16/2023   Lab Results  Component Value Date   TSH 0.99 12/20/2023   CBC    Component Value Date/Time   WBC 4.9 12/20/2023 0852   RBC 5.05 12/20/2023 0852   HGB 15.4 (H) 12/20/2023 0852   HCT 46.2 (H) 12/20/2023 0852   PLT 237.0 12/20/2023 0852   MCV 91.4 12/20/2023 0852   MCH 29.8 11/11/2019 2237   MCHC 33.4 12/20/2023 0852   RDW 13.1 12/20/2023 0852   Iron Studies    Component Value Date/Time   IRON 129 12/17/2019 0926   FERRITIN 348 (H) 10/22/2019 0222   IRONPCTSAT 43.1 12/17/2019 0926   Lipid Panel     Component Value Date/Time   CHOL 191 07/01/2024 0816   TRIG 63 07/01/2024 0816   HDL 76 07/01/2024 0816   CHOLHDL 2.5 07/01/2024 0816   CHOLHDL 3 12/20/2023 0852   VLDL 13.4 12/20/2023 0852   LDLCALC 103 (H) 07/01/2024 0816   LDLDIRECT 164.5 09/19/2012 1052   Hepatic Function Panel     Component Value Date/Time   PROT 6.8 07/01/2024 0816   ALBUMIN 4.5 07/01/2024 0816   AST 14 07/01/2024 0816   ALT 14 07/01/2024 0816   ALKPHOS 74 07/01/2024 0816   BILITOT 0.3 07/01/2024 0816   BILIDIR 0.2 12/20/2023 0852   BILIDIR <0.10 11/02/2021 0847      Component Value Date/Time   TSH 0.99 12/20/2023 0852   Nutritional Lab Results  Component Value Date   VD25OH 42.6 07/01/2024   VD25OH 48.0 01/17/2024   VD25OH 44.0 09/13/2023    Attestations:   I, Feliciano Mingle, acting as a stage manager for Lauren Jenkins, DO., have compiled all relevant documentation for today's office visit on behalf of Lauren Jenkins, DO, while in the presence of [employer], DO.  I have reviewed the above documentation for accuracy and completeness, and I agree with the above. Lauren Wolfe, D.O.  The 21st Century Cures Act was signed into law in 2016 which includes the topic of electronic health records.  This provides immediate access to information in  MyChart.  This includes consultation notes, operative notes, office notes, lab results and pathology reports.  If you have any questions about what you read please let us  know at your next visit so we can discuss your concerns and take corrective action if need be.  We are right here with you.  "

## 2024-11-04 ENCOUNTER — Other Ambulatory Visit (HOSPITAL_COMMUNITY): Payer: Self-pay

## 2024-11-04 ENCOUNTER — Telehealth (INDEPENDENT_AMBULATORY_CARE_PROVIDER_SITE_OTHER): Admitting: Family Medicine

## 2024-11-06 ENCOUNTER — Other Ambulatory Visit (HOSPITAL_COMMUNITY): Payer: Self-pay

## 2024-11-06 ENCOUNTER — Encounter (HOSPITAL_COMMUNITY): Payer: Self-pay

## 2024-11-07 ENCOUNTER — Encounter: Payer: Self-pay | Admitting: Cardiovascular Disease

## 2024-11-07 ENCOUNTER — Other Ambulatory Visit (HOSPITAL_COMMUNITY): Payer: Self-pay

## 2024-12-02 ENCOUNTER — Ambulatory Visit (INDEPENDENT_AMBULATORY_CARE_PROVIDER_SITE_OTHER): Admitting: Family Medicine

## 2024-12-16 ENCOUNTER — Ambulatory Visit: Admitting: Neurology

## 2024-12-23 ENCOUNTER — Ambulatory Visit: Admitting: Neurology
